# Patient Record
Sex: Male | Born: 1935 | Race: White | Hispanic: No | Marital: Married | Smoking: Former smoker
Health system: Southern US, Community
[De-identification: ages and names within clinical notes are randomized; demographics above are authoritative.]

## PROBLEM LIST (undated history)

## (undated) DIAGNOSIS — E785 Hyperlipidemia, unspecified: Secondary | ICD-10-CM

## (undated) DIAGNOSIS — K7469 Other cirrhosis of liver: Secondary | ICD-10-CM

## (undated) DIAGNOSIS — I4891 Unspecified atrial fibrillation: Secondary | ICD-10-CM

## (undated) DIAGNOSIS — I482 Chronic atrial fibrillation, unspecified: Secondary | ICD-10-CM

## (undated) DIAGNOSIS — N261 Atrophy of kidney (terminal): Secondary | ICD-10-CM

## (undated) DIAGNOSIS — I10 Essential (primary) hypertension: Secondary | ICD-10-CM

## (undated) DIAGNOSIS — K311 Adult hypertrophic pyloric stenosis: Secondary | ICD-10-CM

## (undated) DIAGNOSIS — K31819 Angiodysplasia of stomach and duodenum without bleeding: Secondary | ICD-10-CM

## (undated) DIAGNOSIS — E119 Type 2 diabetes mellitus without complications: Secondary | ICD-10-CM

## (undated) DIAGNOSIS — M1990 Unspecified osteoarthritis, unspecified site: Secondary | ICD-10-CM

## (undated) DIAGNOSIS — K922 Gastrointestinal hemorrhage, unspecified: Secondary | ICD-10-CM

## (undated) DIAGNOSIS — J181 Lobar pneumonia, unspecified organism: Secondary | ICD-10-CM

## (undated) DIAGNOSIS — D509 Iron deficiency anemia, unspecified: Secondary | ICD-10-CM

## (undated) DIAGNOSIS — R0602 Shortness of breath: Secondary | ICD-10-CM

## (undated) DIAGNOSIS — Z8701 Personal history of pneumonia (recurrent): Secondary | ICD-10-CM

## (undated) DIAGNOSIS — A0472 Enterocolitis due to Clostridium difficile, not specified as recurrent: Secondary | ICD-10-CM

## (undated) DIAGNOSIS — Z9889 Other specified postprocedural states: Secondary | ICD-10-CM

## (undated) DIAGNOSIS — J449 Chronic obstructive pulmonary disease, unspecified: Secondary | ICD-10-CM

## (undated) DIAGNOSIS — I864 Gastric varices: Secondary | ICD-10-CM

## (undated) DIAGNOSIS — Z8551 Personal history of malignant neoplasm of bladder: Secondary | ICD-10-CM

## (undated) DIAGNOSIS — G589 Mononeuropathy, unspecified: Secondary | ICD-10-CM

## (undated) DIAGNOSIS — I509 Heart failure, unspecified: Secondary | ICD-10-CM

## (undated) DIAGNOSIS — E538 Deficiency of other specified B group vitamins: Secondary | ICD-10-CM

## (undated) HISTORY — DX: Personal history of pneumonia (recurrent): Z87.01

## (undated) HISTORY — DX: Other cirrhosis of liver: K74.69

## (undated) HISTORY — DX: Adult hypertrophic pyloric stenosis: K31.1

## (undated) HISTORY — DX: Gastric varices: I86.4

## (undated) HISTORY — PX: BLADDER REMOVAL: SHX567

## (undated) HISTORY — DX: Deficiency of other specified B group vitamins: E53.8

## (undated) HISTORY — DX: Unspecified atrial fibrillation: I48.91

## (undated) HISTORY — DX: Angiodysplasia of stomach and duodenum without bleeding: K31.819

## (undated) HISTORY — DX: Mononeuropathy, unspecified: G58.9

## (undated) HISTORY — DX: Other specified postprocedural states: Z98.890

## (undated) HISTORY — DX: Gastrointestinal hemorrhage, unspecified: K92.2

## (undated) HISTORY — DX: Chronic atrial fibrillation, unspecified: I48.20

## (undated) HISTORY — DX: Lobar pneumonia, unspecified organism: J18.1

## (undated) HISTORY — DX: Atrophy of kidney (terminal): N26.1

## (undated) HISTORY — PX: FETAL BLOOD TRANSFUSION: SHX1602

## (undated) HISTORY — DX: Type 2 diabetes mellitus without complications: E11.9

## (undated) HISTORY — DX: Iron deficiency anemia, unspecified: D50.9

## (undated) HISTORY — DX: Essential (primary) hypertension: I10

## (undated) HISTORY — DX: Heart failure, unspecified: I50.9

## (undated) HISTORY — DX: Enterocolitis due to Clostridium difficile, not specified as recurrent: A04.72

## (undated) HISTORY — DX: Hyperlipidemia, unspecified: E78.5

## (undated) HISTORY — DX: Unspecified osteoarthritis, unspecified site: M19.90

## (undated) HISTORY — DX: Chronic obstructive pulmonary disease, unspecified: J44.9

## (undated) HISTORY — DX: Personal history of malignant neoplasm of bladder: Z85.51

---

## 1982-01-25 DIAGNOSIS — Z8551 Personal history of malignant neoplasm of bladder: Secondary | ICD-10-CM

## 1982-01-25 HISTORY — PX: PROSTATECTOMY: SHX69

## 1982-01-25 HISTORY — DX: Personal history of malignant neoplasm of bladder: Z85.51

## 1982-01-25 HISTORY — PX: ILEOSTOMY: SHX1783

## 1988-01-26 HISTORY — PX: PENILE PROSTHESIS IMPLANT: SHX240

## 1995-05-26 HISTORY — PX: US ECHOCARDIOGRAPHY: HXRAD669

## 1996-09-25 ENCOUNTER — Encounter: Payer: Self-pay | Admitting: Family Medicine

## 1996-09-25 LAB — CONVERTED CEMR LAB: PSA: 0.1 ng/mL

## 1997-03-25 ENCOUNTER — Encounter: Payer: Self-pay | Admitting: Family Medicine

## 1997-03-25 LAB — CONVERTED CEMR LAB: Hgb A1c MFr Bld: 5.8 %

## 1998-02-25 ENCOUNTER — Encounter: Payer: Self-pay | Admitting: Family Medicine

## 1998-02-25 LAB — CONVERTED CEMR LAB
Hgb A1c MFr Bld: 6.9 %
Microalbumin U total vol: 32.8 mg/L
PSA: 0 ng/mL

## 1998-10-26 ENCOUNTER — Encounter: Payer: Self-pay | Admitting: Family Medicine

## 1998-10-26 LAB — CONVERTED CEMR LAB: Hgb A1c MFr Bld: 5.9 %

## 1999-03-26 ENCOUNTER — Encounter: Payer: Self-pay | Admitting: Family Medicine

## 1999-04-26 ENCOUNTER — Encounter: Payer: Self-pay | Admitting: Family Medicine

## 1999-10-26 ENCOUNTER — Encounter: Payer: Self-pay | Admitting: Family Medicine

## 1999-10-26 LAB — CONVERTED CEMR LAB: Hgb A1c MFr Bld: 6.3 %

## 2000-04-25 ENCOUNTER — Encounter: Payer: Self-pay | Admitting: Family Medicine

## 2001-03-25 ENCOUNTER — Encounter: Payer: Self-pay | Admitting: Family Medicine

## 2001-09-25 ENCOUNTER — Encounter: Payer: Self-pay | Admitting: Family Medicine

## 2001-09-25 LAB — CONVERTED CEMR LAB
Hgb A1c MFr Bld: 5.2 %
Microalbumin U total vol: 47 mg/L

## 2002-01-25 ENCOUNTER — Encounter: Payer: Self-pay | Admitting: Family Medicine

## 2002-04-26 ENCOUNTER — Encounter: Payer: Self-pay | Admitting: Family Medicine

## 2002-04-26 LAB — CONVERTED CEMR LAB
Hgb A1c MFr Bld: 5.4 %
Microalbumin U total vol: 93.7 mg/L

## 2003-04-26 ENCOUNTER — Encounter: Payer: Self-pay | Admitting: Family Medicine

## 2003-07-24 HISTORY — PX: COLONOSCOPY: SHX174

## 2003-08-08 ENCOUNTER — Ambulatory Visit (HOSPITAL_COMMUNITY): Admission: RE | Admit: 2003-08-08 | Discharge: 2003-08-08 | Payer: Self-pay | Admitting: Gastroenterology

## 2003-08-08 HISTORY — PX: OTHER SURGICAL HISTORY: SHX169

## 2004-02-05 ENCOUNTER — Ambulatory Visit: Payer: Self-pay | Admitting: Family Medicine

## 2004-03-25 ENCOUNTER — Encounter: Payer: Self-pay | Admitting: Family Medicine

## 2004-03-25 LAB — CONVERTED CEMR LAB
Hgb A1c MFr Bld: 4.9 %
Hgb A1c MFr Bld: 4.9 %

## 2004-04-02 ENCOUNTER — Ambulatory Visit: Payer: Self-pay | Admitting: Family Medicine

## 2004-04-06 ENCOUNTER — Ambulatory Visit: Payer: Self-pay | Admitting: Family Medicine

## 2004-04-08 ENCOUNTER — Ambulatory Visit: Payer: Self-pay | Admitting: Family Medicine

## 2004-05-25 ENCOUNTER — Encounter: Payer: Self-pay | Admitting: Family Medicine

## 2004-05-25 LAB — CONVERTED CEMR LAB
Hgb A1c MFr Bld: 5.1 %
Hgb A1c MFr Bld: 5.1 %
Microalbumin U total vol: 200 mg/L
Microalbumin U total vol: 200 mg/L

## 2004-05-25 LAB — FECAL OCCULT BLOOD, GUAIAC: Fecal Occult Blood: NEGATIVE

## 2004-06-08 ENCOUNTER — Ambulatory Visit: Payer: Self-pay | Admitting: Family Medicine

## 2004-06-10 ENCOUNTER — Ambulatory Visit: Payer: Self-pay | Admitting: Family Medicine

## 2004-06-19 ENCOUNTER — Ambulatory Visit: Payer: Self-pay | Admitting: Family Medicine

## 2004-07-17 ENCOUNTER — Ambulatory Visit: Payer: Self-pay | Admitting: Family Medicine

## 2004-07-23 ENCOUNTER — Ambulatory Visit: Payer: Self-pay | Admitting: Family Medicine

## 2004-09-04 ENCOUNTER — Ambulatory Visit: Payer: Self-pay | Admitting: Family Medicine

## 2004-10-02 ENCOUNTER — Ambulatory Visit: Payer: Self-pay | Admitting: Family Medicine

## 2004-10-25 ENCOUNTER — Encounter: Payer: Self-pay | Admitting: Family Medicine

## 2004-10-25 LAB — CONVERTED CEMR LAB
Hgb A1c MFr Bld: 5 %
Hgb A1c MFr Bld: 5 %

## 2004-10-29 ENCOUNTER — Ambulatory Visit: Payer: Self-pay | Admitting: Family Medicine

## 2004-11-02 ENCOUNTER — Ambulatory Visit: Payer: Self-pay | Admitting: Family Medicine

## 2005-06-25 ENCOUNTER — Encounter: Payer: Self-pay | Admitting: Family Medicine

## 2005-06-25 LAB — CONVERTED CEMR LAB
Hgb A1c MFr Bld: 5.9 %
Hgb A1c MFr Bld: 5.9 %

## 2005-07-12 ENCOUNTER — Ambulatory Visit: Payer: Self-pay | Admitting: Family Medicine

## 2005-07-14 ENCOUNTER — Ambulatory Visit: Payer: Self-pay | Admitting: Family Medicine

## 2005-08-03 ENCOUNTER — Ambulatory Visit: Payer: Self-pay | Admitting: Family Medicine

## 2005-08-11 ENCOUNTER — Ambulatory Visit: Payer: Self-pay | Admitting: Family Medicine

## 2005-08-13 ENCOUNTER — Ambulatory Visit: Payer: Self-pay | Admitting: Family Medicine

## 2005-11-09 ENCOUNTER — Ambulatory Visit: Payer: Self-pay | Admitting: Family Medicine

## 2005-11-11 ENCOUNTER — Ambulatory Visit: Payer: Self-pay | Admitting: Family Medicine

## 2006-03-14 ENCOUNTER — Ambulatory Visit: Payer: Self-pay | Admitting: Family Medicine

## 2006-04-26 ENCOUNTER — Encounter: Payer: Self-pay | Admitting: Family Medicine

## 2006-05-11 ENCOUNTER — Ambulatory Visit: Payer: Self-pay | Admitting: Family Medicine

## 2006-05-13 ENCOUNTER — Ambulatory Visit: Payer: Self-pay | Admitting: Family Medicine

## 2006-07-14 ENCOUNTER — Encounter: Payer: Self-pay | Admitting: Family Medicine

## 2006-07-14 DIAGNOSIS — I10 Essential (primary) hypertension: Secondary | ICD-10-CM | POA: Insufficient documentation

## 2006-07-14 DIAGNOSIS — I359 Nonrheumatic aortic valve disorder, unspecified: Secondary | ICD-10-CM | POA: Insufficient documentation

## 2006-07-14 DIAGNOSIS — E785 Hyperlipidemia, unspecified: Secondary | ICD-10-CM | POA: Insufficient documentation

## 2006-07-14 DIAGNOSIS — E118 Type 2 diabetes mellitus with unspecified complications: Secondary | ICD-10-CM

## 2006-07-18 ENCOUNTER — Ambulatory Visit: Payer: Self-pay | Admitting: Family Medicine

## 2006-07-18 LAB — CONVERTED CEMR LAB
AST: 31 units/L (ref 0–37)
Alkaline Phosphatase: 83 units/L (ref 39–117)
BUN: 16 mg/dL (ref 6–23)
Basophils Absolute: 0.2 10*3/uL — ABNORMAL HIGH (ref 0.0–0.1)
Calcium: 9.3 mg/dL (ref 8.4–10.5)
Eosinophils Relative: 4.8 % (ref 0.0–5.0)
GFR calc non Af Amer: 78 mL/min
HCT: 35.9 % — ABNORMAL LOW (ref 39.0–52.0)
HDL: 29.1 mg/dL — ABNORMAL LOW (ref >39.0)
Hgb A1c MFr Bld: 5 % (ref 4.6–6.0)
Lymphocytes Relative: 32.7 % (ref 12.0–46.0)
MCV: 93.2 fL (ref 78.0–100.0)
Monocytes Absolute: 0.5 10*3/uL (ref 0.2–0.7)
Neutro Abs: 3 10*3/uL (ref 1.4–7.7)
Neutrophils Relative %: 49.7 % (ref 43.0–77.0)
Potassium: 4.2 meq/L (ref 3.5–5.1)
TSH: 3.74 microintl units/mL (ref 0.35–5.50)
Total Bilirubin: 0.5 mg/dL (ref 0.3–1.2)
Total Protein: 7.2 g/dL (ref 6.0–8.3)
Triglycerides: 115 mg/dL (ref 0–149)

## 2006-07-25 ENCOUNTER — Ambulatory Visit: Payer: Self-pay | Admitting: Family Medicine

## 2006-08-09 ENCOUNTER — Ambulatory Visit: Payer: Self-pay | Admitting: Family Medicine

## 2006-08-10 ENCOUNTER — Ambulatory Visit: Payer: Self-pay | Admitting: Family Medicine

## 2006-08-17 ENCOUNTER — Ambulatory Visit: Payer: Self-pay | Admitting: Family Medicine

## 2006-09-09 ENCOUNTER — Ambulatory Visit: Payer: Self-pay | Admitting: Family Medicine

## 2006-09-12 ENCOUNTER — Ambulatory Visit: Payer: Self-pay | Admitting: Family Medicine

## 2006-09-12 LAB — CONVERTED CEMR LAB
CO2: 27 meq/L (ref 19–32)
Calcium: 9.2 mg/dL (ref 8.4–10.5)
GFR calc non Af Amer: 88 mL/min
Potassium: 4.1 meq/L (ref 3.5–5.1)
Sodium: 141 meq/L (ref 135–145)

## 2006-09-14 ENCOUNTER — Ambulatory Visit: Payer: Self-pay | Admitting: Family Medicine

## 2006-10-12 ENCOUNTER — Ambulatory Visit: Payer: Self-pay | Admitting: Gastroenterology

## 2006-10-26 ENCOUNTER — Ambulatory Visit (HOSPITAL_COMMUNITY): Admission: RE | Admit: 2006-10-26 | Discharge: 2006-10-26 | Payer: Self-pay | Admitting: Gastroenterology

## 2006-10-26 ENCOUNTER — Encounter: Payer: Self-pay | Admitting: Family Medicine

## 2006-10-26 ENCOUNTER — Encounter: Payer: Self-pay | Admitting: Gastroenterology

## 2006-10-26 DIAGNOSIS — K648 Other hemorrhoids: Secondary | ICD-10-CM | POA: Insufficient documentation

## 2006-10-26 HISTORY — PX: ESOPHAGOGASTRODUODENOSCOPY: SHX1529

## 2006-10-26 HISTORY — PX: COLONOSCOPY: SHX174

## 2006-10-26 LAB — HM COLONOSCOPY

## 2006-10-28 ENCOUNTER — Ambulatory Visit (HOSPITAL_COMMUNITY): Admission: RE | Admit: 2006-10-28 | Discharge: 2006-10-28 | Payer: Self-pay | Admitting: Gastroenterology

## 2006-11-02 ENCOUNTER — Ambulatory Visit: Payer: Self-pay | Admitting: Gastroenterology

## 2006-11-02 ENCOUNTER — Encounter: Payer: Self-pay | Admitting: Family Medicine

## 2006-11-02 LAB — CONVERTED CEMR LAB
Anti Nuclear Antibody(ANA): NEGATIVE
Folate: 14.2 ng/mL
Hep B S Ab: NEGATIVE
Hepatitis B Surface Ag: NEGATIVE
Iron: 60 ug/dL (ref 42–165)
Transferrin: 303.9 mg/dL (ref >=212.0)
Vitamin B-12: 200 pg/mL — ABNORMAL LOW (ref 211–911)

## 2006-11-03 ENCOUNTER — Ambulatory Visit: Payer: Self-pay | Admitting: Family Medicine

## 2006-11-04 ENCOUNTER — Ambulatory Visit: Payer: Self-pay | Admitting: Gastroenterology

## 2006-11-15 ENCOUNTER — Ambulatory Visit: Payer: Self-pay | Admitting: Gastroenterology

## 2007-03-03 ENCOUNTER — Ambulatory Visit: Payer: Self-pay | Admitting: Family Medicine

## 2007-03-06 ENCOUNTER — Ambulatory Visit: Payer: Self-pay | Admitting: Family Medicine

## 2007-03-07 ENCOUNTER — Telehealth (INDEPENDENT_AMBULATORY_CARE_PROVIDER_SITE_OTHER): Payer: Self-pay | Admitting: *Deleted

## 2007-04-03 DIAGNOSIS — K4090 Unilateral inguinal hernia, without obstruction or gangrene, not specified as recurrent: Secondary | ICD-10-CM | POA: Insufficient documentation

## 2007-04-03 DIAGNOSIS — D509 Iron deficiency anemia, unspecified: Secondary | ICD-10-CM

## 2007-06-29 ENCOUNTER — Ambulatory Visit: Payer: Self-pay | Admitting: Family Medicine

## 2007-07-04 ENCOUNTER — Ambulatory Visit: Payer: Self-pay | Admitting: Family Medicine

## 2007-08-08 ENCOUNTER — Ambulatory Visit: Payer: Self-pay | Admitting: Family Medicine

## 2007-08-10 ENCOUNTER — Encounter: Payer: Self-pay | Admitting: Family Medicine

## 2007-11-28 ENCOUNTER — Telehealth: Payer: Self-pay | Admitting: Family Medicine

## 2007-11-28 ENCOUNTER — Ambulatory Visit: Payer: Self-pay | Admitting: Gastroenterology

## 2007-11-28 DIAGNOSIS — D61818 Other pancytopenia: Secondary | ICD-10-CM

## 2007-11-29 ENCOUNTER — Ambulatory Visit: Payer: Self-pay | Admitting: Family Medicine

## 2007-11-30 LAB — CONVERTED CEMR LAB
ALT: 22 units/L (ref 0–53)
AST: 28 units/L (ref 0–37)
Basophils Absolute: 0 10*3/uL (ref 0.0–0.1)
CO2: 31 meq/L (ref 19–32)
Cholesterol: 100 mg/dL (ref 0–200)
Eosinophils Absolute: 0.3 10*3/uL (ref 0.0–0.7)
GFR calc non Af Amer: 88 mL/min
Glucose, Bld: 108 mg/dL — ABNORMAL HIGH (ref 70–99)
HDL: 35.9 mg/dL — ABNORMAL LOW (ref >39.0)
Hgb A1c MFr Bld: 4.9 % (ref 4.6–6.0)
Iron: 39 ug/dL — ABNORMAL LOW (ref 42–165)
LDL Cholesterol: 41 mg/dL (ref 0–99)
MCHC: 35 g/dL (ref 30.0–36.0)
Microalb, Ur: 12.2 mg/dL — ABNORMAL HIGH (ref 0.0–1.9)
Monocytes Absolute: 0.6 10*3/uL (ref 0.1–1.0)
Neutro Abs: 4 10*3/uL (ref 1.4–7.7)
Sodium: 143 meq/L (ref 135–145)
TSH: 3.35 microintl units/mL (ref 0.35–5.50)
Total CHOL/HDL Ratio: 2.8

## 2007-12-05 ENCOUNTER — Ambulatory Visit: Payer: Self-pay | Admitting: Family Medicine

## 2007-12-05 DIAGNOSIS — N259 Disorder resulting from impaired renal tubular function, unspecified: Secondary | ICD-10-CM | POA: Insufficient documentation

## 2008-01-02 ENCOUNTER — Telehealth: Payer: Self-pay | Admitting: Family Medicine

## 2008-01-15 ENCOUNTER — Ambulatory Visit: Payer: Self-pay | Admitting: Family Medicine

## 2008-06-10 ENCOUNTER — Ambulatory Visit: Payer: Self-pay | Admitting: Family Medicine

## 2008-06-10 ENCOUNTER — Telehealth: Payer: Self-pay | Admitting: Family Medicine

## 2008-06-12 ENCOUNTER — Encounter: Payer: Self-pay | Admitting: Cardiology

## 2008-06-12 ENCOUNTER — Ambulatory Visit: Payer: Self-pay | Admitting: Cardiology

## 2008-06-13 LAB — CONVERTED CEMR LAB: Pro B Natriuretic peptide (BNP): 43 pg/mL (ref 0.0–100.0)

## 2008-06-14 ENCOUNTER — Telehealth: Payer: Self-pay | Admitting: Family Medicine

## 2008-06-20 ENCOUNTER — Encounter: Payer: Self-pay | Admitting: Cardiology

## 2008-06-20 ENCOUNTER — Ambulatory Visit: Payer: Self-pay

## 2008-06-25 ENCOUNTER — Ambulatory Visit: Payer: Self-pay | Admitting: Family Medicine

## 2008-07-02 ENCOUNTER — Ambulatory Visit: Payer: Self-pay | Admitting: Cardiology

## 2008-07-03 ENCOUNTER — Encounter: Payer: Self-pay | Admitting: Cardiology

## 2008-07-03 ENCOUNTER — Ambulatory Visit: Payer: Self-pay | Admitting: Cardiology

## 2008-07-03 LAB — CONVERTED CEMR LAB
ALT: 20 units/L (ref 0–53)
AST: 25 units/L (ref 0–37)
Albumin: 4.6 g/dL (ref 3.5–5.2)
Alkaline Phosphatase: 92 units/L (ref 39–117)
CO2: 19 meq/L (ref 19–32)
Calcium: 9.5 mg/dL (ref 8.4–10.5)
Chloride: 108 meq/L (ref 96–112)
Cholesterol: 92 mg/dL (ref 0–200)
Glucose, Bld: 103 mg/dL — ABNORMAL HIGH (ref 70–99)
HDL: 35 mg/dL — ABNORMAL LOW (ref >39)
Potassium: 4.3 meq/L (ref 3.5–5.3)
Total Protein: 7 g/dL (ref 6.0–8.3)

## 2008-07-17 ENCOUNTER — Ambulatory Visit: Payer: Self-pay | Admitting: Cardiology

## 2008-07-22 LAB — CONVERTED CEMR LAB
BUN: 20 mg/dL (ref 6–23)
Creatinine, Ser: 1.19 mg/dL (ref 0.40–1.50)
Potassium: 4.2 meq/L (ref 3.5–5.3)

## 2008-08-01 ENCOUNTER — Ambulatory Visit: Payer: Self-pay | Admitting: Family Medicine

## 2008-08-14 ENCOUNTER — Encounter: Payer: Self-pay | Admitting: Cardiology

## 2008-12-10 ENCOUNTER — Ambulatory Visit: Payer: Self-pay | Admitting: Family Medicine

## 2008-12-10 LAB — CONVERTED CEMR LAB
ALT: 25 units/L (ref 0–53)
Albumin: 4.3 g/dL (ref 3.5–5.2)
BUN: 17 mg/dL (ref 6–23)
Basophils Relative: 1.2 % (ref 0.0–3.0)
Bilirubin, Direct: 0.1 mg/dL (ref 0.0–0.3)
Chloride: 104 meq/L (ref 96–112)
Cholesterol: 84 mg/dL (ref 0–200)
Creatinine,U: 68.7 mg/dL
Glucose, Bld: 133 mg/dL — ABNORMAL HIGH (ref 70–99)
HCT: 38.6 % — ABNORMAL LOW (ref 39.0–52.0)
Hgb A1c MFr Bld: 5.7 % (ref 4.6–6.5)
LDL Cholesterol: 42 mg/dL (ref 0–99)
Monocytes Absolute: 0.7 10*3/uL (ref 0.1–1.0)
Monocytes Relative: 11.2 % (ref 3.0–12.0)
Neutrophils Relative %: 57.5 % (ref 43.0–77.0)
Platelets: 167 10*3/uL (ref 150.0–400.0)
Potassium: 4 meq/L (ref 3.5–5.1)
Total CHOL/HDL Ratio: 3
Triglycerides: 70 mg/dL (ref 0.0–149.0)
VLDL: 14 mg/dL (ref 0.0–40.0)
WBC: 6.3 10*3/uL (ref 4.5–10.5)

## 2008-12-17 ENCOUNTER — Ambulatory Visit: Payer: Self-pay | Admitting: Family Medicine

## 2009-03-24 ENCOUNTER — Telehealth: Payer: Self-pay | Admitting: Family Medicine

## 2009-03-24 ENCOUNTER — Encounter: Payer: Self-pay | Admitting: Family Medicine

## 2009-04-30 ENCOUNTER — Ambulatory Visit: Payer: Self-pay | Admitting: Family Medicine

## 2009-04-30 LAB — CONVERTED CEMR LAB
BUN: 15 mg/dL (ref 6–23)
Creatinine, Ser: 1 mg/dL (ref 0.4–1.5)
Glucose, Bld: 114 mg/dL — ABNORMAL HIGH (ref 70–99)
Potassium: 4.1 meq/L (ref 3.5–5.1)

## 2009-05-05 ENCOUNTER — Ambulatory Visit: Payer: Self-pay | Admitting: Family Medicine

## 2009-06-16 ENCOUNTER — Ambulatory Visit: Payer: Self-pay | Admitting: Family Medicine

## 2009-06-16 LAB — CONVERTED CEMR LAB: Hgb A1c MFr Bld: 5.3 % (ref 4.6–6.5)

## 2009-06-17 ENCOUNTER — Telehealth (INDEPENDENT_AMBULATORY_CARE_PROVIDER_SITE_OTHER): Payer: Self-pay | Admitting: *Deleted

## 2009-06-19 ENCOUNTER — Ambulatory Visit: Payer: Self-pay | Admitting: Family Medicine

## 2009-06-30 ENCOUNTER — Ambulatory Visit: Payer: Self-pay | Admitting: Family Medicine

## 2009-09-02 ENCOUNTER — Encounter (INDEPENDENT_AMBULATORY_CARE_PROVIDER_SITE_OTHER): Payer: Self-pay | Admitting: *Deleted

## 2009-12-16 ENCOUNTER — Ambulatory Visit: Payer: Self-pay | Admitting: Family Medicine

## 2009-12-17 LAB — CONVERTED CEMR LAB
BUN: 16 mg/dL (ref 6–23)
Calcium: 9.4 mg/dL (ref 8.4–10.5)
Chloride: 106 meq/L (ref 96–112)
Eosinophils Absolute: 0.4 10*3/uL (ref 0.0–0.7)
GFR calc non Af Amer: 81.27 mL/min (ref >60)
Glucose, Bld: 135 mg/dL — ABNORMAL HIGH (ref 70–99)
HCT: 39.2 % (ref 39.0–52.0)
HDL: 29.1 mg/dL — ABNORMAL LOW (ref >39.00)
Lymphocytes Relative: 22.5 % (ref 12.0–46.0)
Lymphs Abs: 1.6 10*3/uL (ref 0.7–4.0)
MCHC: 34.4 g/dL (ref 30.0–36.0)
MCV: 94 fL (ref 78.0–100.0)
Microalb Creat Ratio: 20.2 mg/g (ref 0.0–30.0)
Microalb, Ur: 13.3 mg/dL — ABNORMAL HIGH (ref 0.0–1.9)
Monocytes Relative: 9.5 % (ref 3.0–12.0)
Neutrophils Relative %: 61.9 % (ref 43.0–77.0)
Phosphorus: 3.6 mg/dL (ref 2.3–4.6)
Potassium: 4.2 meq/L (ref 3.5–5.1)
RBC: 4.17 M/uL — ABNORMAL LOW (ref 4.22–5.81)
RDW: 14.2 % (ref 11.5–14.6)
Total Bilirubin: 0.8 mg/dL (ref 0.3–1.2)
Triglycerides: 90 mg/dL (ref 0.0–149.0)
VLDL: 18 mg/dL (ref 0.0–40.0)
Vitamin B-12: 1394 pg/mL — ABNORMAL HIGH (ref 211–911)

## 2009-12-24 ENCOUNTER — Ambulatory Visit: Payer: Self-pay | Admitting: Family Medicine

## 2010-01-21 ENCOUNTER — Ambulatory Visit: Payer: Self-pay | Admitting: Family Medicine

## 2010-01-25 DIAGNOSIS — Z8701 Personal history of pneumonia (recurrent): Secondary | ICD-10-CM

## 2010-01-25 HISTORY — DX: Personal history of pneumonia (recurrent): Z87.01

## 2010-02-24 NOTE — Progress Notes (Signed)
Summary: Alternative for Avalide  Phone Note From Pharmacy Call back at [phone]   Caller: [facility] [place]. [phone]* Call For: Dr. Hetty Ely  Summary of Call: Received fax from pharmacy stating that Avalide 300-25 has been recalled.  Patient is aware of this recall and the pharmacist wants to know what new Rx do you want to put the patient on in place of Avalide.  Please advise. Initial call taken by: Linde Gillis CMA Duncan Dull),  March 24, 2009 9:58 AM  Follow-up for Phone Call        Start Hyzaar 100/25 in AM, see me 6 wks after starting new medication with Bmet prior 401.9 Follow-up by: Shaune Leeks MD,  March 24, 2009 1:48 PM  Additional Follow-up for Phone Call Additional follow up Details #1::        Pharmacist called Additional Follow-up by: Daine Gip,  March 24, 2009 2:21 PM    Additional Follow-up for Phone Call Additional follow up Details #2::    Patient advised as instructed.  Rx to [facility] on [address] cancelled, patient requested that it be sent to [facility] on [address]  6 week f/u appt and lab appt scheduled. Follow-up by: Linde Gillis CMA Duncan Dull),  March 24, 2009 2:33 PM  New/Updated Medications: HYZAAR 100-25 MG TABS (LOSARTAN POTASSIUM-HCTZ) one tab by mouth in AM Prescriptions: HYZAAR 100-25 MG TABS (LOSARTAN POTASSIUM-HCTZ) one tab by mouth in AM  #30 x 12   Entered by:   Linde Gillis CMA (AAMA)   Authorized by:   Shaune Leeks MD   Signed by:   Linde Gillis CMA (AAMA) on 03/24/2009   Method used:   Electronically to        [facility]. [address] [phone]* (retail)       [address] [place], [place]  [id number]       Ph: [phone]       Fax: [phone]   RxID:   [id number] HYZAAR 100-25 MG TABS (LOSARTAN POTASSIUM-HCTZ) one tab by mouth in AM  #30 x 12   Entered and Authorized by:   Shaune Leeks MD   Signed by:   Shaune Leeks MD on 03/24/2009   Method used:   Electronically to     [facility] [phone]* (retail)       [address] [place], [place]  [address]       Ph: [phone]       Fax: [phone]   RxID:   [id number]

## 2010-02-24 NOTE — Progress Notes (Signed)
   Request Recieved from [facility] sent to [facility] [place]  Jun 17, 2009 9:02 AM

## 2010-02-24 NOTE — Miscellaneous (Signed)
Summary: Consent for removal of skin tags from neck  Consent for removal of skin tags from neck   Imported By: Beau Fanny 07/01/2009 10:12:00  _____________________________________________________________________  External Attachment:    Type:   Image     Comment:   External Document

## 2010-02-24 NOTE — Assessment & Plan Note (Signed)
Summary: F/U AFTER LABS / LFW   Vital Signs:  Patient profile:   77 year old male Weight:      231.50 pounds Temp:     98.3 degrees F oral Pulse rate:   60 / minute Pulse rhythm:   regular BP sitting:   150 / 70  (left arm) Cuff size:   large  Vitals Entered By: Sydell Axon LPN (Jun 19, 2009 8:04 AM) CC: 6 Month follow-up after labs   History of Present Illness: Pt here for followup. He has frequent loose stools. We had changed from Hyzaar to Cozaar as he felt the diuretic was doing that but obviously he continues with it so not the problem. Could be the Losartan molecule itself. He was switched from Avalide to Losartan due to insurance.  He also has a skin tag on the lower neck he would like removed as he hits it all the time shaving and it is therefore getting bigger and bleeds a lot.  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 1 By Mouth At Bedtime 2)  Simvastatin 80 Mg Tabs (Simvastatin) .... Take 1 Tablet By Mouth At Bedtime 3)  Cozaar 100 Mg Tabs  (Losartan Potassium) .... One Tab By Mouth Daily. 4)  Adult Aspirin Ec Low Strength 81 Mg  Tbec (Aspirin) .Marland Kitchen.. 1 By Mouth Daily 5)  Amlodipine Besylate 10 Mg Tabs (Amlodipine Besylate) .... One Tab By Mouth At Night 6)  Ferro-Bob 325 (65 Fe) Mg  Tabs (Ferrous Sulfate) .Marland Kitchen.. 1 By Mouth Daily 7)  Diprolene   Oint (Aug Betamethasone Dipropionate Oint) .... As Needed 8)  Onetouch Ultra Test   Strp (Glucose Blood) .Marland Kitchen.. 1 Daily As Directed// Icd-9 Code 250.00 9)  Onetouch Lancets   Misc (Lancets) .Marland Kitchen.. 1 Daily and As Needed/ Icd-9 Code 250.00 10)  Potassium Gluconate 595 Mg Cr-Tabs (Potassium Gluconate) .Marland Kitchen.. 1 Tablet By Mouth Once Daily 11)  Vitamin B-12 2500 Mcg Subl (Cyanocobalamin) .Marland Kitchen.. 1 Daily By Mouth 12)  Tylenol 325 Mg Tabs (Acetaminophen) .... As Needed 13)  Furosemide 20 Mg Tabs (Furosemide) .... Take One Tablet By Mouth Daily. 14)  Carvedilol 12.5 Mg Tabs (Carvedilol) .... Take One Tablet By Mouth Twice A Day  Allergies: No Known Drug Allergies  Physical Exam  General:  Well-developed,well-nourished,in no acute distress; alert,appropriate and cooperative throughout examination, obese,  comfortable and breathing without distress, but mildly congested. Head:  Normocephalic and atraumatic without obvious abnormalities. Almost total  balding. Sinuses NT. Eyes:  Conjunctiva clear bilaterally.  Lungs:  Normal respiratory effort, chest expands symmetrically. Lungs are clear to auscultation, no  crackles or wheezes. Fine wheeze cleared after second deep breath. Heart:  Normal rate and regular rhythm. S1 and S2 normal without gallop, murmur, click, rub or other extra sounds. Skin:  Verrucal skin tag on lower ant neck. Appears benign.   Impression & Recommendations:  Problem # 1:  HYPERTENSION (ICD-401.9) Assessment Deteriorated Worse today. Will change back to Hyzaar and try strategy to firm up stools. See instructions. His updated medication list for this problem includes:    Hyzaar 100-12.5  Mg Tabs (Losartan potassium-hctz) ..... One tab by mouth in am    Amlodipine Besylate 10 Mg Tabs (Amlodipine besylate) ..... One tab by mouth at night    Furosemide 20 Mg Tabs (Furosemide) .Marland Kitchen... Take one tablet by mouth daily.    Carvedilol 12.5 Mg Tabs (Carvedilol) .Marland Kitchen... Take one tablet by mouth twice a day  BP today: 150/70 Prior BP: 134/60 (05/05/2009)  Labs Reviewed: K+: 4.1 (04/30/2009) Creat: : 1.0 (04/30/2009)   Chol: 84 (12/10/2008)   HDL: 28.30 (12/10/2008)   LDL: 42 (12/10/2008)   TG: 70.0 (12/10/2008)  Problem # 2:  DIARRHEA (ICD-787.91) Assessment: Unchanged  Start Citrucel per instructions.  Discussed symptom control and diet. Call if worsening of symptoms or signs of dehydration.   Problem # 3:  DIABETES MELLITUS, TYPE II (ICD-250.00) Assessment: Improved Great control. Conrt curr meds, kidney fctn nml. His updated medication list for this problem includes:    Metformin Hcl 500 Mg Tabs (Metformin hcl) .Marland Kitchen... 1 by mouth at bedtime    Hyzaar 100-12.5 Mg Tabs (Losartan potassium-hctz) ..... One tab by mouth in am    Adult Aspirin Ec Low Strength 81 Mg Tbec (Aspirin) .Marland Kitchen... 1 by mouth daily  Labs Reviewed: Creat: 1.0 (04/30/2009)   Microalbumin: 139 (06/25/2005)  Last Eye Exam: normal (04/03/2008) Reviewed HgBA1c results: 5.3 (06/16/2009)  5.7 (12/10/2008)  Problem # 4:  SKIN TAG, IRRITATED (ICD-701.9) Assessment: New Will schedule pt for excision via shave technique and cautery.  Complete Medication List: 1)  Metformin Hcl 500 Mg Tabs (Metformin hcl) .Marland Kitchen.. 1 by mouth at bedtime 2)  Simvastatin 80 Mg Tabs (Simvastatin) .... Take 1 tablet by mouth at bedtime 3)  Hyzaar 100-12.5 Mg Tabs (Losartan potassium-hctz) .... One tab by mouth in am 4)  Adult Aspirin Ec Low Strength 81 Mg Tbec (Aspirin) .Marland Kitchen.. 1 by mouth daily 5)  Amlodipine Besylate 10 Mg Tabs (Amlodipine besylate) .... One tab by mouth at night 6)  Ferro-bob 325 (65 Fe) Mg Tabs (Ferrous sulfate) .Marland Kitchen.. 1 by mouth  daily 7)  Diprolene Oint (Aug betamethasone dipropionate oint) .... As needed 8)  Onetouch Ultra Test Strp (Glucose blood) .Marland Kitchen.. 1 daily as directed// icd-9 code 250.00 9)  Onetouch Lancets Misc (Lancets) .Marland Kitchen.. 1 daily and as needed/ icd-9 code 250.00 10)  Potassium Gluconate 595 Mg Cr-tabs (Potassium gluconate) .Marland Kitchen.. 1 tablet by mouth once daily 11)  Vitamin B-12 2500 Mcg Subl (Cyanocobalamin) .Marland Kitchen.. 1 daily by mouth 12)  Tylenol 325 Mg Tabs (Acetaminophen) .... As needed 13)  Furosemide 20 Mg Tabs (Furosemide) .... Take one tablet by mouth daily. 14)  Carvedilol 12.5 Mg Tabs (Carvedilol) .... Take one tablet by mouth twice a day  Patient Instructions: 1)  RTC for skin tag removal when able.  2)  Try Citrucel 1 tsp in 8 oz of water every morning. 3)  Finish Cozaar and then go back to Hyzaar. Prescriptions: HYZAAR 100-12.5 MG TABS (LOSARTAN POTASSIUM-HCTZ) one tab by mouth in AM  #30 x 12   Entered  and Authorized by:   Shaune Leeks MD   Signed by:   Shaune Leeks MD on 06/19/2009   Method used:   Electronically to        [facility]. [address] [phone]* (retail)       [address] [place], [place]  [id number]       Ph: [phone]       Fax: [phone]   RxID:   [id number]   Current Allergies (reviewed today): No known allergies

## 2010-02-24 NOTE — Letter (Signed)
Summary: Nadara Eaton letter  [place] [facility]  [address] [place], [place] [address]   Phone: [phone]  Fax: [phone]       09/02/2009 MRN: [id number]  Bosten Dominy [address] [place], [place]  [address]  Dear Mr. Yott,  [facility] [place], and  announce the retirement of Arta Silence, M.D., from full-time practice at the [facility] office effective July 24, 2009 and his plans of returning part-time.  It is important to Dr. Hetty Ely and to our practice that you understand that [facility] has seven physicians in our office for your health care needs.  We will continue to offer the same exceptional care that you have today.    Dr. Hetty Ely has spoken to many of you about his plans for retirement and returning part-time in the fall.   We will continue to work with you through the transition to schedule appointments for you in the office and meet the high standards that [place] is committed to.   Again, it is with great pleasure that we share the news that Dr. Hetty Ely will return to [facility] at [facility] in October of 2011 with a reduced schedule.    If you have any questions, or would like to request an appointment with one of our physicians, please call us at [phone] and press the option for Scheduling an appointment.  We take pleasure in providing you with excellent patient care and look forward to seeing you at your next office visit.  Our [facility] Physicians are:  Tillman Abide, M.D. Laurita Quint, M.D. Roxy Manns, M.D. Kerby Nora, M.D. Hannah Beat, M.D. Ruthe Mannan, M.D. We proudly welcomed Raechel Ache, M.D. and Eustaquio Boyden, M.D. to the practice in July/August 2011.  Sincerely,  [facility]

## 2010-02-24 NOTE — Assessment & Plan Note (Signed)
Summary: CPX/DLO   Vital Signs:  Patient profile:   77 year old male Height:      69 inches Weight:      230 pounds BMI:     34.09 Temp:     97.6 degrees F oral Pulse rate:   76 / minute Pulse rhythm:   regular BP sitting:   162 / 62  (left arm) Cuff size:   large  Vitals Entered By: Linde Gillis CMA Duncan Dull) (December 24, 2009 10:55 AM) CC: complete physicial   History of Present Illness: Pt here for Comp Exam. He has had a little cough lately and has taken diabetic cough medicine and Mucinex...he doesn't know if it was D. He hasn't had any in a week or so. He has a hacky cough that feels like he needs to bring something up but can't. He otherwise feels well with no complaints. He is not on an ACEI. Sugars running 110s -150s, one 269.  Preventive Screening-Counseling & Management  Alcohol-Tobacco     Alcohol drinks/day: <1     Alcohol type: beer      Smoking Status: quit     Year Quit: 1990     Pack years: 50     Passive Smoke Exposure: no  Caffeine-Diet-Exercise     Caffeine use/day: 3     Does Patient Exercise: no     Type of exercise: yard work     Times/week: 2  Problems Prior to Update: 1)  Skin Tag, Irritated  (ICD-701.9) 2)  Syncope  (ICD-780.2) 3)  Diarrhea  (ICD-787.91) 4)  Dyspnea On Exertion  (ICD-786.09) 5)  Renal Insufficiency  (ICD-588.9) 6)  Anemia-b12 Deficiency  (ICD-281.1) 7)  Cirrhosis  (ICD-571.5) 8)  Shoulder Strain, Right Deltoid  (ICD-840.9) 9)  Inguinal Hernia, Left  (ICD-550.90) 10)  Anemia, Iron Deficiency  (ICD-280.9) 11)  Pyloric Stenosis  (ICD-750.5) 12)  Hemorrhoids, Internal  (ICD-455.0) 13)  Varices, Site Nec (GASTRIC)  (ICD-456.8) 14)  Back Pain  (ICD-724.5) 15)  Blood in Stool (INT HEMMS)  (ICD-578.1) 16)  Screening For Malignannt Neoplasm, Site Nec  (ICD-V76.49) 17)  Dependent Edema, Legs  (ICD-782.3) 18)  Screening For Malignant Neoplasm, Prostate  (ICD-V76.44) 19)  Bladder Cancer (ILEOSTOMY/PROSTATECTOMY)  (ICD-188.9) 20)   Aortic Insufficiency  (ICD-424.1) 21)  Hypertension  (ICD-401.9) 22)  Hyperlipidemia  (ICD-272.4) 23)  Diabetes Mellitus, Type II  (ICD-250.00)  Medications Prior to Update: 1)  Metformin Hcl 500 Mg Tabs (Metformin Hcl) .Marland Kitchen.. 1 By Mouth At Bedtime 2)  Simvastatin 80 Mg Tabs (Simvastatin) .... Take 1 Tablet By Mouth At Bedtime 3)  Hyzaar 100-12.5 Mg Tabs (Losartan Potassium-Hctz) .... One Tab By Mouth in Am 4)  Adult Aspirin Ec Low Strength 81 Mg  Tbec (Aspirin) .Marland Kitchen.. 1 By Mouth Daily 5)  Amlodipine Besylate 10 Mg Tabs (Amlodipine Besylate) .... One Tab By Mouth At Night 6)  Ferro-Bob 325 (65 Fe) Mg  Tabs (Ferrous Sulfate) .Marland Kitchen.. 1 By Mouth Daily 7)  Diprolene   Oint (Aug Betamethasone Dipropionate Oint) .... As Needed 8)  Onetouch Ultra Test   Strp (Glucose Blood) .Marland Kitchen.. 1 Daily As Directed// Icd-9 Code 250.00 9)  Onetouch Lancets   Misc (Lancets) .Marland Kitchen.. 1 Daily and As Needed/ Icd-9 Code 250.00 10)  Potassium Gluconate 595 Mg Cr-Tabs (Potassium Gluconate) .Marland Kitchen.. 1 Tablet By Mouth Once Daily 11)  Vitamin B-12 2500 Mcg Subl (Cyanocobalamin) .Marland Kitchen.. 1 Daily By Mouth 12)  Tylenol 325 Mg Tabs (Acetaminophen) .... As Needed 13)  Furosemide 20 Mg  Tabs (Furosemide) .... Take One Tablet By Mouth Daily. 14)  Carvedilol 12.5 Mg Tabs (Carvedilol) .... Take One Tablet By Mouth Twice A Day  Current Medications (verified): 1)  Metformin Hcl 500 Mg Tabs (Metformin Hcl) .Marland Kitchen.. 1 By Mouth At Bedtime 2)  Simvastatin 80 Mg Tabs (Simvastatin) .... Take 1 Tablet By Mouth At Bedtime 3)  Hyzaar 100-12.5 Mg Tabs (Losartan Potassium-Hctz) .... One Tab By Mouth in Am 4)  Adult Aspirin Ec Low Strength 81 Mg  Tbec (Aspirin) .Marland Kitchen.. 1 By Mouth Daily 5)  Amlodipine Besylate 10 Mg Tabs (Amlodipine Besylate) .... One Tab By Mouth At Night 6)  Ferro-Bob 325 (65 Fe) Mg  Tabs (Ferrous Sulfate) .Marland Kitchen.. 1 By Mouth Daily 7)  Diprolene   Oint (Aug Betamethasone Dipropionate Oint) .... As Needed 8)  Onetouch Ultra Test   Strp (Glucose Blood) .Marland Kitchen..  1 Daily As Directed// Icd-9 Code 250.00 9)  Onetouch Lancets   Misc (Lancets) .Marland Kitchen.. 1 Daily and As Needed/ Icd-9 Code 250.00 10)  Potassium Gluconate 595 Mg Cr-Tabs (Potassium Gluconate) .Marland Kitchen.. 1 Tablet By Mouth Once Daily 11)  Vitamin B-12 2500 Mcg Subl (Cyanocobalamin) .Marland Kitchen.. 1 Daily By Mouth 12)  Tylenol 325 Mg Tabs (Acetaminophen) .... As Needed 13)  Furosemide 20 Mg Tabs (Furosemide) .... Take One Tablet By Mouth Daily. 14)  Carvedilol 12.5 Mg Tabs (Carvedilol) .... Take One Tablet By Mouth Twice A Day  Allergies (verified): No Known Drug Allergies  Past History:  Past Medical History: Last updated: 07/03/2008 1. Diabetes mellitus, type II 2. Hyperlipidemia 3. Hypertension 4. Cryptogenic cirrhosis:  Followed by Dr. Russella Dar, never biopsied, has been stable.  History of gastric varices.  5. Pyloric stenosis-mild 6. Left inguinal hernia 7. Iron deficiency anemia 8. B12 deficiency 9. Diastolic CHF:  Echo 5/10 with mild LVH, EF 75%, grade I diastolic dysfunction, severe left atrial enlargement, aortic sclerosis. 10. Bladder cancer 1984: s/p ileal conduit 11. Hemorrhoids    Family History: Last updated: 12/24/2009 Father: dec. 24, MI Mother: dec  36 Natural Causes  HTN, osteoporosis, kyphoplasty x 2  Brother dec 76 MI  DM,  Sisters 81   1    both with CAD, developed in their late 42s. CV:  (+) Father, sister x 2 HBP:  (+) uncles and mother DM:  (+) self, uncle, GP's, brother CA:  self, bladder Stroke:  (+) GP's No FH of Colon Cancer:  Social History: Last updated: 06/12/2008 Marital Status: Married, lives with wife Children: 7 Occupation: Retired, trucking Alcohol use-yes, beer/week Drug use-no Daily Caffeine Use Tobacco Use - Former, quit 1990.   Risk Factors: Alcohol Use: <1 (12/24/2009) Caffeine Use: 3 (12/24/2009) Exercise: no (12/24/2009)  Risk Factors: Smoking Status: quit (12/24/2009) Passive Smoke Exposure: no (12/24/2009)  Past Surgical History: 1984           Bladder Ca w/Ileostomy and Prosatectomy 1990          Inflatable Penis Implant 5/97           Echo  EF 60%  T.R.  A.I. 07/24/03      Colonoscopy, int. hemorrhoids 08/08/03      BE 10/26/06      Colonoscopy, int hemmorhoids             10 yrs 10/26/06      EGD Gastric Varices, Pyloric Stenosis  Family History: Father: dec. 20, MI Mother: dec  76 Natural Causes  HTN, osteoporosis, kyphoplasty x 2  Brother dec 76 MI  DM,  Sisters 14  73    both with CAD, developed in their late 31s. CV:  (+) Father, sister x 2 HBP:  (+) uncles and mother DM:  (+) self, uncle, GP's, brother CA:  self, bladder Stroke:  (+) GP's No FH of Colon Cancer:  Review of Systems General:  Complains of fatigue; denies chills, fever, sweats, weakness, and weight loss; occas. Eyes:  Denies blurring, discharge, and eye pain. ENT:  Denies decreased hearing, earache, and ringing in ears. CV:  Complains of fatigue and shortness of breath with exertion; denies chest pain or discomfort, fainting, palpitations, swelling of feet, and swelling of hands; rare. Resp:  Complains of cough; denies shortness of breath and wheezing; see HPI. GI:  Denies abdominal pain, bloody stools, change in bowel habits, constipation, dark tarry stools, diarrhea, indigestion, loss of appetite, nausea, vomiting, vomiting blood, and yellowish skin color. GU:  Complains of nocturia and urinary frequency; denies discharge and dysuria; "I gpo all night long". MS:  Complains of low back pain; denies joint pain, muscle aches, and cramps; occas in yard. Derm:  Complains of dryness; denies itching and rash; chronic. Neuro:  Denies memory loss, numbness, poor balance, and tingling.  Physical Exam  General:  Well-developed,well-nourished,in no acute distress; alert,appropriate and cooperative throughout examination, obese,  comfortable and breathing without distress, but mildly coughing. Head:  Normocephalic and atraumatic without obvious abnormalities.  Almost total  balding. Sinuses NT. Eyes:  Conjunctiva clear bilaterally.  Ears:  External ear exam shows no significant lesions or deformities.  Otoscopic examination reveals clear canals, tympanic membranes are intact bilaterally without bulging, retraction, inflammation or discharge. Hearing is grossly normal bilaterally. Nose:  External nasal examination shows no deformity or inflammation. Nasal mucosa are pink and moist without lesions or exudates. L nostril inflamed. Mouth:  Oral mucosa and oropharynx without lesions or exudates.  Teeth in good repair. Neck:  No deformities, masses, or tenderness noted. Chest Wall:  No deformities, masses, tenderness, mild physiologic gynecomastia noted. Breasts:  No masses or gynecomastia noted Lungs:  Normal respiratory effort, chest expands symmetrically. Lungs are clear to auscultation, no crackles or wheezes.  Heart:  Normal rate and regular rhythm. S1 and S2 normal without gallop, murmur, click, rub or other extra sounds. Abdomen:  Bowel sounds positive,abdomen soft and non-tender without masses, organomegaly or hernias noted. Mildly obese and protuberant. Ileostomy intact and functional. Rectal:  No external abnormalities noted. Normal sphincter tone. No rectal masses or tenderness. Black stool from iron. Genitalia:  Testes bilaterally descended without nodularity, tenderness or masses. No scrotal masses or lesions. No penis lesions or urethral discharge. Inflation device in scrotum with prothesis. Prostate:  Absent. Msk:  No deformity or scoliosis noted of thoracic or lumbar spine. Slightly decreased ability to get up out of chair. Pulses:  R and L carotid,radial,femoral,dorsalis pedis and posterior tibial pulses are full and equal bilaterally Extremities:  No clubbing, cyanosis, edema, or deformity noted with normal full range of motion of all joints.   Neurologic:  No cranial nerve deficits noted. Station and gait are normal. Sensory, motor and  coordinative functions appear intact. Skin:  Intact without suspicious lesions or rashes except for abundant AKs on bald head!!! Warned to wear hat. Cervical Nodes:  No lymphadenopathy noted Inguinal Nodes:  No significant adenopathy Psych:  Cognition and judgment appear intact. Alert and cooperative with normal attention span and concentration. No apparent delusions, illusions, hallucinations  Diabetes Management Exam:    Foot Exam (with socks and/or shoes not present):  Sensory-Pinprick/Light touch:          Left medial foot (L-4): normal          Left dorsal foot (L-5): normal          Left lateral foot (S-1): normal          Right medial foot (L-4): normal          Right dorsal foot (L-5): normal          Right lateral foot (S-1): normal       Sensory-Monofilament:          Left foot: normal          Right foot: normal       Inspection:          Left foot: normal          Right foot: normal       Nails:          Left foot: normal          Right foot: normal    Eye Exam:       Eye Exam done elsewhere          Date: 05/06/2009          Results: normal          Done by: Dr Alexia Freestone   Impression & Recommendations:  Problem # 1:  HEALTH MAINTENANCE EXAM (ICD-V70.0) Assessment Comment Only  Reviewed preventive care protocols, scheduled due services, and updated immunizations.  Problem # 2:  DYSPNEA ON EXERTION (ICD-786.09) Assessment: Unchanged  Sounds better from his description and sounds like deconditioning. Encouraged to exercise regularly. His updated medication list for this problem includes:    Hyzaar 100-12.5 Mg Tabs (Losartan potassium-hctz) ..... One tab by mouth in am    Furosemide 20 Mg Tabs (Furosemide) .Marland Kitchen... Take one tablet by mouth daily.    Carvedilol 12.5 Mg Tabs (Carvedilol) .Marland Kitchen... Take one tablet by mouth twice a day  Recommended fluid and salt restriction.   Problem # 3:  RENAL INSUFFICIENCY (ICD-588.9) Assessment: Improved BUN/ CR nml today.  Microalb nml also.  Problem # 4:  ANEMIA-B12 DEFICIENCY (ICD-281.1) Assessment: Improved  Adequately replaced. Would drop one dose of B12 once a week. His updated medication list for this problem includes:    Ferro-bob 325 (65 Fe) Mg Tabs (Ferrous sulfate) .Marland Kitchen... 1 by mouth daily    Vitamin B-12 2500 Mcg Subl (Cyanocobalamin) .Marland Kitchen... 1 daily by mouth  Hgb: 13.5 (12/16/2009)   Hct: 39.2 (12/16/2009)   Platelets: 171.0 (12/16/2009) RBC: 4.17 (12/16/2009)   RDW: 14.2 (12/16/2009)   WBC: 7.3 (12/16/2009) MCV: 94.0 (12/16/2009)   MCHC: 34.4 (12/16/2009) Ferritin: 55.1 (11/02/2006) Iron: 63 (12/16/2009)   % Sat: 14.1 (11/02/2006) B12: 1394 (12/16/2009)   Folate: 14.2 (11/02/2006)   TSH: 3.35 (11/29/2007)  Problem # 5:  CIRRHOSIS (ICD-571.5) Assessment: Improved  Liver fctn back to nml via labs. His updated medication list for this problem includes:    Hyzaar 100-12.5 Mg Tabs (Losartan potassium-hctz) ..... One tab by mouth in am    Furosemide 20 Mg Tabs (Furosemide) .Marland Kitchen... Take one tablet by mouth daily.    Carvedilol 12.5 Mg Tabs (Carvedilol) .Marland Kitchen... Take one tablet by mouth twice a day  Diagnostics Reviewed:  SGOT (AST): 27 (12/16/2009)   SGPT (ALT): 22 (12/16/2009)   T. Bili: 0.8 (12/16/2009)   D. Bili: 0.1 (12/16/2009)   Alk Phos: 86 (12/16/2009) Albumin: 4.4 (12/16/2009)   Iron: 63 (12/16/2009)   Ferritin: 55.1 (  11/02/2006)  Problem # 6:  ANEMIA, IRON DEFICIENCY (ICD-280.9) Assessment: Improved  Iron lvl nml today. His updated medication list for this problem includes:    Ferro-bob 325 (65 Fe) Mg Tabs (Ferrous sulfate) .Marland Kitchen... 1 by mouth daily    Vitamin B-12 2500 Mcg Subl (Cyanocobalamin) .Marland Kitchen... 1 daily by mouth  Hgb: 13.5 (12/16/2009)   Hct: 39.2 (12/16/2009)   Platelets: 171.0 (12/16/2009) RBC: 4.17 (12/16/2009)   RDW: 14.2 (12/16/2009)   WBC: 7.3 (12/16/2009) MCV: 94.0 (12/16/2009)   MCHC: 34.4 (12/16/2009) Ferritin: 55.1 (11/02/2006) Iron: 63 (12/16/2009)   % Sat: 14.1  (11/02/2006) B12: 1394 (12/16/2009)   Folate: 14.2 (11/02/2006)   TSH: 3.35 (11/29/2007)  Problem # 7:  BLADDER CANCER (ILEOSTOMY/PROSTATECTOMY) (ICD-188.9) Assessment: Unchanged Stable.  Problem # 8:  HYPERTENSION (ICD-401.9) Assessment: Deteriorated Presume elevation from decongestant use. Will recheck in the future. His updated medication list for this problem includes:    Hyzaar 100-12.5 Mg Tabs (Losartan potassium-hctz) ..... One tab by mouth in am    Amlodipine Besylate 10 Mg Tabs (Amlodipine besylate) ..... One tab by mouth at night    Furosemide 20 Mg Tabs (Furosemide) .Marland Kitchen... Take one tablet by mouth daily.    Carvedilol 12.5 Mg Tabs (Carvedilol) .Marland Kitchen... Take one tablet by mouth twice a day  BP today: 162/62 Prior BP: 138/64 (06/30/2009)  Labs Reviewed: K+: 4.2 (12/16/2009) Creat: : 1.0 (12/16/2009)   Chol: 96 (12/16/2009)   HDL: 29.10 (12/16/2009)   LDL: 49 (12/16/2009)   TG: 90.0 (12/16/2009)  Problem # 9:  HYPERLIPIDEMIA (ICD-272.4) Assessment: Improved Decrease Simva to 40mg  due to gereat nos and also on Amlodipine. His updated medication list for this problem includes:    Simvastatin 80 Mg Tabs (Simvastatin) .Marland Kitchen... Take 1 tablet by mouth at bedtime  Labs Reviewed: SGOT: 27 (12/16/2009)   SGPT: 22 (12/16/2009)   HDL:29.10 (12/16/2009), 28.30 (12/10/2008)  LDL:49 (12/16/2009), 42 (12/10/2008)  Chol:96 (12/16/2009), 84 (12/10/2008)  Trig:90.0 (12/16/2009), 70.0 (12/10/2008)  Problem # 10:  DIABETES MELLITUS, TYPE II (ICD-250.00) Assessment: Unchanged Good control. Cont as is. His updated medication list for this problem includes:    Metformin Hcl 500 Mg Tabs (Metformin hcl) .Marland Kitchen... 1 by mouth at bedtime    Hyzaar 100-12.5 Mg Tabs (Losartan potassium-hctz) ..... One tab by mouth in am    Adult Aspirin Ec Low Strength 81 Mg Tbec (Aspirin) .Marland Kitchen... 1 by mouth daily  Labs Reviewed: Creat: 1.0 (12/16/2009)   Microalbumin: 139 (06/25/2005)  Last Eye Exam: normal  (05/06/2009) Reviewed HgBA1c results: 6.2 (12/16/2009)  5.3 (06/16/2009)  Complete Medication List: 1)  Metformin Hcl 500 Mg Tabs (Metformin hcl) .Marland Kitchen.. 1 by mouth at bedtime 2)  Simvastatin 80 Mg Tabs (Simvastatin) .... Take 1 tablet by mouth at bedtime 3)  Hyzaar 100-12.5 Mg Tabs (Losartan potassium-hctz) .... One tab by mouth in am 4)  Adult Aspirin Ec Low Strength 81 Mg Tbec (Aspirin) .Marland Kitchen.. 1 by mouth daily 5)  Amlodipine Besylate 10 Mg Tabs (Amlodipine besylate) .... One tab by mouth at night 6)  Ferro-bob 325 (65 Fe) Mg Tabs (Ferrous sulfate) .Marland Kitchen.. 1 by mouth daily 7)  Diprolene Oint (Aug betamethasone dipropionate oint) .... As needed 8)  Onetouch Ultra Test Strp (Glucose blood) .Marland Kitchen.. 1 daily as directed// icd-9 code 250.00 9)  Onetouch Lancets Misc (Lancets) .Marland Kitchen.. 1 daily and as needed/ icd-9 code 250.00 10)  Potassium Gluconate 595 Mg Cr-tabs (Potassium gluconate) .Marland Kitchen.. 1 tablet by mouth once daily 11)  Vitamin B-12 2500 Mcg Subl (Cyanocobalamin) .Marland Kitchen.. 1 daily  by mouth 12)  Tylenol 325 Mg Tabs (Acetaminophen) .... As needed 13)  Furosemide 20 Mg Tabs (Furosemide) .... Take one tablet by mouth daily. 14)  Carvedilol 12.5 Mg Tabs (Carvedilol) .... Take one tablet by mouth twice a day  Other Orders: Flu Vaccine 9yrs + MEDICARE PATIENTS (N2355) Administration Flu vaccine - MCR (D3220)  Patient Instructions: 1)  RTC 1 mo for BP check. 2)  RTC 6 mos, A1C prior. 3)  Take Guaifenesin by going to [facility] or [facility] and getting MUCOUS RELIEF EXPECTORANT (400mg ), take 11/2 tabs by mouth AM and NOON. 4)  Drink lots of fluids anytime taking Guaifenesin.  5)  Flu shot today.   Orders Added: 1)  Flu Vaccine 1yrs + MEDICARE PATIENTS [Q2039] 2)  Administration Flu vaccine - MCR [G0008] 3)  Est. Patient 65& > [25427]     Current Allergies (reviewed today): No known allergies   Flu Vaccine Consent Questions     Do you have a history of severe allergic reactions to this  vaccine? no    Any prior history of allergic reactions to egg and/or gelatin? no    Do you have a sensitivity to the preservative Thimersol? no    Do you have a past history of Guillan-Barre Syndrome? no    Do you currently have an acute febrile illness? no    Have you ever had a severe reaction to latex? no    Vaccine information given and explained to patient? yes    Are you currently pregnant? no    Lot Number:[id number]   Exp Date:07/25/2010   Site Given  Left Deltoid [email or web address]

## 2010-02-24 NOTE — Assessment & Plan Note (Signed)
Summary: REMOVAL OF SKIN TAGS-15MIN PER DR S. CYD   Vital Signs:  Patient profile:   77 year old male Weight:      228.50 pounds Temp:     98.5 degrees F oral Pulse rate:   64 / minute Pulse rhythm:   regular BP sitting:   138 / 64  (left arm) Cuff size:   large  Vitals Entered By: Sydell Axon LPN (July 01, 1306 11:53 AM) CC: Removal of skin tag from neck   History of Present Illness: Pt here for the removal of a skin lesion from the lower neck over the Adam's apple which is consistently irritated from shaving, often bleeds and in general causes almost daily concern.  Problems Prior to Update: 1)  Skin Tag, Irritated  (ICD-701.9) 2)  Syncope  (ICD-780.2) 3)  Diarrhea  (ICD-787.91) 4)  Dyspnea On Exertion  (ICD-786.09) 5)  Renal Insufficiency  (ICD-588.9) 6)  Anemia-b12 Deficiency  (ICD-281.1) 7)  Cirrhosis  (ICD-571.5) 8)  Shoulder Strain, Right Deltoid  (ICD-840.9) 9)  Inguinal Hernia, Left  (ICD-550.90) 10)  Anemia, Iron Deficiency  (ICD-280.9) 11)  Pyloric Stenosis  (ICD-750.5) 12)  Hemorrhoids, Internal  (ICD-455.0) 13)  Varices, Site Nec (GASTRIC)  (ICD-456.8) 14)  Back Pain  (ICD-724.5) 15)  Blood in Stool (INT HEMMS)  (ICD-578.1) 16)  Screening For Malignannt Neoplasm, Site Nec  (ICD-V76.49) 17)  Dependent Edema, Legs  (ICD-782.3) 18)  Screening For Malignant Neoplasm, Prostate  (ICD-V76.44) 19)  Bladder Cancer (ILEOSTOMY/PROSTATECTOMY)  (ICD-188.9) 20)  Aortic Insufficiency  (ICD-424.1) 21)  Hypertension  (ICD-401.9) 22)  Hyperlipidemia  (ICD-272.4) 23)  Diabetes Mellitus, Type II  (ICD-250.00)  Medications Prior to Update: 1)  Metformin Hcl 500 Mg Tabs (Metformin Hcl) .Marland Kitchen.. 1 By Mouth At Bedtime 2)  Simvastatin 80 Mg Tabs (Simvastatin) .... Take 1 Tablet By Mouth At Bedtime 3)  Hyzaar 100-12.5 Mg Tabs (Losartan Potassium-Hctz) .... One Tab By Mouth in Am 4)  Adult Aspirin Ec Low Strength 81 Mg  Tbec (Aspirin) .Marland Kitchen.. 1 By Mouth Daily 5)  Amlodipine Besylate 10  Mg Tabs (Amlodipine Besylate) .... One Tab By Mouth At Night 6)  Ferro-Bob 325 (65 Fe) Mg  Tabs (Ferrous Sulfate) .Marland Kitchen.. 1 By Mouth Daily 7)  Diprolene   Oint (Aug Betamethasone Dipropionate Oint) .... As Needed 8)  Onetouch Ultra Test   Strp (Glucose Blood) .Marland Kitchen.. 1 Daily As Directed// Icd-9 Code 250.00 9)  Onetouch Lancets   Misc (Lancets) .Marland Kitchen.. 1 Daily and As Needed/ Icd-9 Code 250.00 10)  Potassium Gluconate 595 Mg Cr-Tabs (Potassium Gluconate) .Marland Kitchen.. 1 Tablet By Mouth Once Daily 11)  Vitamin B-12 2500 Mcg Subl (Cyanocobalamin) .Marland Kitchen.. 1 Daily By Mouth 12)  Tylenol 325 Mg Tabs (Acetaminophen) .... As Needed 13)  Furosemide 20 Mg Tabs (Furosemide) .... Take One Tablet By Mouth Daily. 14)  Carvedilol 12.5 Mg Tabs (Carvedilol) .... Take One Tablet By Mouth Twice A Day  Allergies: No Known Drug Allergies  Physical Exam  General:  Well-developed,well-nourished,in no acute distress; alert,appropriate and cooperative throughout examination, obese,  comfortable and breathing without distress, but mildly congested. Head:  Normocephalic and atraumatic without obvious abnormalities. Almost total  balding. Sinuses NT. Eyes:  Conjunctiva clear bilaterally.  Ears:  External ear exam shows no significant lesions or deformities.  Otoscopic examination reveals clear canals, tympanic membranes are intact bilaterally without bulging, retraction, inflammation or discharge. Hearing is grossly normal bilaterally. Nose:  External nasal examination shows no deformity or inflammation. Nasal mucosa are pink and  moist without lesions or exudates. L nostril inflamed. Mouth:  Oral mucosa and oropharynx without lesions or exudates.  Teeth in good repair. Skin:  Anterior neck prepped in usual sterile manner. @% lido w/ epi used for local. Lesion clipped with scissors and cauterized with hyfrecation. Sterile dressing with neosporin placed.   Impression & Recommendations:  Problem # 1:  SKIN TAG, IRRITATED  (ICD-701.9) Assessment Improved  Removed.  Orders: Removal of Skin Tags up to 15 Lesions (11200)  Complete Medication List: 1)  Metformin Hcl 500 Mg Tabs (Metformin hcl) .Marland Kitchen.. 1 by mouth at bedtime 2)  Simvastatin 80 Mg Tabs (Simvastatin) .... Take 1 tablet by mouth at bedtime 3)  Hyzaar 100-12.5 Mg Tabs (Losartan potassium-hctz) .... One tab by mouth in am 4)  Adult Aspirin Ec Low Strength 81 Mg Tbec (Aspirin) .Marland Kitchen.. 1 by mouth daily 5)  Amlodipine Besylate 10 Mg Tabs (Amlodipine besylate) .... One tab by mouth at night 6)  Ferro-bob 325 (65 Fe) Mg Tabs (Ferrous sulfate) .Marland Kitchen.. 1 by mouth daily 7)  Diprolene Oint (Aug betamethasone dipropionate oint) .... As needed 8)  Onetouch Ultra Test Strp (Glucose blood) .Marland Kitchen.. 1 daily as directed// icd-9 code 250.00 9)  Onetouch Lancets Misc (Lancets) .Marland Kitchen.. 1 daily and as needed/ icd-9 code 250.00 10)  Potassium Gluconate 595 Mg Cr-tabs (Potassium gluconate) .Marland Kitchen.. 1 tablet by mouth once daily 11)  Vitamin B-12 2500 Mcg Subl (Cyanocobalamin) .Marland Kitchen.. 1 daily by mouth 12)  Tylenol 325 Mg Tabs (Acetaminophen) .... As needed 13)  Furosemide 20 Mg Tabs (Furosemide) .... Take one tablet by mouth daily. 14)  Carvedilol 12.5 Mg Tabs (Carvedilol) .... Take one tablet by mouth twice a day  Patient Instructions: 1)  Keep clean and dryfor 24 hrs.  2)  RTC if erythema or swelling ensue.  Current Allergies (reviewed today): No known allergies

## 2010-02-24 NOTE — Assessment & Plan Note (Signed)
Summary: f/u after change in BP meds per Dr. Alvester Chou   Vital Signs:  Patient profile:   77 year old male Weight:      230.75 pounds Temp:     98.5 degrees F oral Pulse rate:   72 / minute Pulse rhythm:   regular BP sitting:   134 / 60  (left arm) Cuff size:   large  Vitals Entered By: Sydell Axon LPN (May 05, 2009 11:59 AM) CC: Follow-up on BP medication change, has a cold, non-productive cough   History of Present Illness: Pt here for having switched Avalide to Hyzaar. He took his first dose on Mar first. He got diarrhea in a few days after doing that with lots of gas. He still has the sxs but but not as bad and it is coming and going. He was switched due to insurance.  His sugar jumped up to 306 one day and he started checking it  regularly daily fasting and it has gradulally come down to 160s, lowest 130. He has also occas checked nighttime no and getds 180. He feels fine,. He has lost 9 pounds since being seen, probably from the diarrhea as he says he has not really changed anything.  Problems Prior to Update: 1)  Dyspnea On Exertion  (ICD-786.09) 2)  Renal Insufficiency  (ICD-588.9) 3)  Anemia-b12 Deficiency  (ICD-281.1) 4)  Cirrhosis  (ICD-571.5) 5)  Shoulder Strain, Right Deltoid  (ICD-840.9) 6)  Inguinal Hernia, Left  (ICD-550.90) 7)  Anemia, Iron Deficiency  (ICD-280.9) 8)  Pyloric Stenosis  (ICD-750.5) 9)  Hemorrhoids, Internal  (ICD-455.0) 10)  Varices, Site Nec (GASTRIC)  (ICD-456.8) 11)  Back Pain  (ICD-724.5) 12)  Blood in Stool (INT HEMMS)  (ICD-578.1) 13)  Screening For Malignannt Neoplasm, Site Nec  (ICD-V76.49) 14)  Dependent Edema, Legs  (ICD-782.3) 15)  Screening For Malignant Neoplasm, Prostate  (ICD-V76.44) 16)  Bladder Cancer (ILEOSTOMY/PROSTATECTOMY)  (ICD-188.9) 17)  Aortic Insufficiency  (ICD-424.1) 18)  Hypertension  (ICD-401.9) 19)  Hyperlipidemia  (ICD-272.4) 20)  Diabetes Mellitus, Type II  (ICD-250.00)  Medications Prior to Update: 1)   Metformin Hcl 500 Mg Tabs (Metformin Hcl) .Marland Kitchen.. 1 By Mouth At Bedtime 2)  Simvastatin 80 Mg Tabs (Simvastatin) .... Take 1 Tablet By Mouth At Bedtime 3)  Hyzaar 100-25 Mg Tabs (Losartan Potassium-Hctz) .... One Tab By Mouth in Am 4)  Adult Aspirin Ec Low Strength 81 Mg  Tbec (Aspirin) .Marland Kitchen.. 1 By Mouth Daily 5)  Amlodipine Besylate 10 Mg Tabs (Amlodipine Besylate) .... One Tab By Mouth At Night 6)  Ferro-Bob 325 (65 Fe) Mg  Tabs (Ferrous Sulfate) .Marland Kitchen.. 1 By Mouth Daily 7)  Diprolene   Oint (Aug Betamethasone Dipropionate Oint) .... As Needed 8)  Onetouch Ultra Test   Strp (Glucose Blood) .Marland Kitchen.. 1 Daily As Directed// Icd-9 Code 250.00 9)  Onetouch Lancets   Misc (Lancets) .Marland Kitchen.. 1 Daily and As Needed/ Icd-9 Code 250.00 10)  Potassium Gluconate 595 Mg Cr-Tabs (Potassium Gluconate) .Marland Kitchen.. 1 Tablet By Mouth Once Daily 11)  Vitamin B-12 2500 Mcg Subl (Cyanocobalamin) .Marland Kitchen.. 1 Daily By Mouth 12)  Tylenol 325 Mg Tabs (Acetaminophen) .... As Needed 13)  Furosemide 20 Mg Tabs (Furosemide) .... Take One Tablet By Mouth Daily. 14)  Carvedilol 12.5 Mg Tabs (Carvedilol) .... Take One Tablet By Mouth Twice A Day  Allergies: No Known Drug Allergies  Physical Exam  General:  Well-developed,well-nourished,in no acute distress; alert,appropriate and cooperative throughout examination, obese,  comfortable and breathing without distress, but mildly  congested. Head:  Normocephalic and atraumatic without obvious abnormalities. Almost total  balding. Sinuses NT. Eyes:  Conjunctiva clear bilaterally.  Ears:  External ear exam shows no significant lesions or deformities.  Otoscopic examination reveals clear canals, tympanic membranes are intact bilaterally without bulging, retraction, inflammation or discharge. Hearing is grossly normal bilaterally. Nose:  External nasal examination shows no deformity or inflammation. Nasal mucosa are pink and moist without lesions or exudates. L nostril inflamed. Mouth:  Oral mucosa and  oropharynx without lesions or exudates.  Teeth in good repair. Neck:  No deformities, masses, or tenderness noted. Lungs:  Normal respiratory effort, chest expands symmetrically. Lungs are clear to auscultation, no crackles or wheezes. Fine wheeze cleared after second deep breath. Heart:  Normal rate and regular rhythm. S1 and S2 normal without gallop, murmur, click, rub or other extra sounds.   Impression & Recommendations:  Problem # 1:  HYPERTENSION (ICD-401.9) Assessment Unchanged Stable after changing from Avalide to Hyzaar. Labs nml .   Will take away HCTZ part of med and recheck at PE. See if helps diarrhea at all. His updated medication list for this problem includes:    Cozaar 100 Mg Tabs (Losartan potassium) ..... One tab by mouth daily.    Amlodipine Besylate 10 Mg Tabs (Amlodipine besylate) ..... One tab by mouth at night    Furosemide 20 Mg Tabs (Furosemide) .Marland Kitchen... Take one tablet by mouth daily.    Carvedilol 12.5 Mg Tabs (Carvedilol) .Marland Kitchen... Take one tablet by mouth twice a day  Problem # 2:  DIARRHEA (ICD-787.91) Assessment: New Will change Hyzaar to Cozaar to see if will affect diarrhea.   Problem # 3:  DIABETES MELLITUS, TYPE II (ICD-250.00) Assessment: Unchanged ont to monitor. Do nothing today but may need to adjust next time. His updated medication list for this problem includes:    Metformin Hcl 500 Mg Tabs (Metformin hcl) .Marland Kitchen... 1 by mouth at bedtime    Cozaar 100 Mg Tabs (Losartan potassium) ..... One tab by mouth daily.    Adult Aspirin Ec Low Strength 81 Mg Tbec (Aspirin) .Marland Kitchen... 1 by mouth daily  Complete Medication List: 1)  Metformin Hcl 500 Mg Tabs (Metformin hcl) .Marland Kitchen.. 1 by mouth at bedtime 2)  Simvastatin 80 Mg Tabs (Simvastatin) .... Take 1 tablet by mouth at bedtime 3)  Cozaar 100 Mg Tabs (Losartan potassium) .... One tab by mouth daily. 4)  Adult Aspirin Ec Low Strength 81 Mg Tbec (Aspirin) .Marland Kitchen.. 1 by mouth daily 5)  Amlodipine Besylate 10 Mg Tabs  (Amlodipine besylate) .... One tab by mouth at night 6)  Ferro-bob 325 (65 Fe) Mg Tabs (Ferrous sulfate) .Marland Kitchen.. 1 by mouth daily 7)  Diprolene Oint (Aug betamethasone dipropionate oint) .... As needed 8)  Onetouch Ultra Test Strp (Glucose blood) .Marland Kitchen.. 1 daily as directed// icd-9 code 250.00 9)  Onetouch Lancets Misc (Lancets) .Marland Kitchen.. 1 daily and as needed/ icd-9 code 250.00 10)  Potassium Gluconate 595 Mg Cr-tabs (Potassium gluconate) .Marland Kitchen.. 1 tablet by mouth once daily 11)  Vitamin B-12 2500 Mcg Subl (Cyanocobalamin) .Marland Kitchen.. 1 daily by mouth 12)  Tylenol 325 Mg Tabs (Acetaminophen) .... As needed 13)  Furosemide 20 Mg Tabs (Furosemide) .... Take one tablet by mouth daily. 14)  Carvedilol 12.5 Mg Tabs (Carvedilol) .... Take one tablet by mouth twice a day  Patient Instructions: 1)  Keep appt. as scheduled. Prescriptions: COZAAR 100 MG TABS (LOSARTAN POTASSIUM) one tab by mouth daily.  #30 x 12   Entered and Authorized by:  Shaune Leeks MD   Signed by:   Shaune Leeks MD on 05/05/2009   Method used:   Electronically to        [facility]. [address] [phone]* (retail)       [address] [place], [place]  [id number]       Ph: [phone]       Fax: [phone]   RxID:   [id number]   Current Allergies (reviewed today): No known allergies

## 2010-02-24 NOTE — Letter (Signed)
Summary: [facility] Ost[facility] Supplies Order  [facility] Equipment Ostomy Supplies Order   Imported By: Beau Fanny 03/25/2009 11:48:00  _____________________________________________________________________  External Attachment:    Type:   Image     Comment:   External Document

## 2010-02-26 NOTE — Assessment & Plan Note (Signed)
Summary: FOLLOW-UP FOR B/P/JRR   Vital Signs:  Patient profile:   77 year old male Height:      69 inches Weight:      231.50 pounds BMI:     34.31 Temp:     98.2 degrees F oral Pulse rate:   72 / minute Pulse rhythm:   regular BP sitting:   146 / 60  (left arm) Cuff size:   large  Vitals Entered By: Delilah Shan CMA Duncan Dull) (January 21, 2010 10:08 AM) CC: Follow up - BP   History of Present Illness: Pt here for BP recheck.   He has lots of stress at home but feels fine. aHe monitors his BP at home and it has been mildly elevated since being here last time. He has tolerated the new medications from Cardiology last visit fine.   Problems Prior to Update: 1)  Health Maintenance Exam  (ICD-V70.0) 2)  Dyspnea On Exertion  (ICD-786.09) 3)  Renal Insufficiency  (ICD-588.9) 4)  Anemia-b12 Deficiency  (ICD-281.1) 5)  Cirrhosis  (ICD-571.5) 6)  Shoulder Strain, Right Deltoid  (ICD-840.9) 7)  Inguinal Hernia, Left  (ICD-550.90) 8)  Anemia, Iron Deficiency  (ICD-280.9) 9)  Pyloric Stenosis  (ICD-750.5) 10)  Hemorrhoids, Internal  (ICD-455.0) 11)  Varices, Site Nec (GASTRIC)  (ICD-456.8) 12)  Blood in Stool (INT HEMMS)  (ICD-578.1) 13)  Screening For Malignannt Neoplasm, Site Nec  (ICD-V76.49) 14)  Screening For Malignant Neoplasm, Prostate  (ICD-V76.44) 15)  Bladder Cancer (ILEOSTOMY/PROSTATECTOMY)  (ICD-188.9) 16)  Aortic Insufficiency  (ICD-424.1) 17)  Hypertension  (ICD-401.9) 18)  Hyperlipidemia  (ICD-272.4) 19)  Diabetes Mellitus, Type II  (ICD-250.00)  Medications Prior to Update: 1)  Metformin Hcl 500 Mg Tabs (Metformin Hcl) .Marland Kitchen.. 1 By Mouth At Bedtime 2)  Simvastatin 80 Mg Tabs (Simvastatin) .... Take 1 Tablet By Mouth At Bedtime 3)  Hyzaar 100-12.5 Mg Tabs (Losartan Potassium-Hctz) .... One Tab By Mouth in Am 4)  Adult Aspirin Ec Low Strength 81 Mg  Tbec (Aspirin) .Marland Kitchen.. 1 By Mouth Daily 5)  Amlodipine Besylate 10 Mg Tabs (Amlodipine Besylate) .... One Tab By Mouth At  Night 6)  Ferro-Bob 325 (65 Fe) Mg  Tabs (Ferrous Sulfate) .Marland Kitchen.. 1 By Mouth Daily 7)  Diprolene   Oint (Aug Betamethasone Dipropionate Oint) .... As Needed 8)  Onetouch Ultra Test   Strp (Glucose Blood) .Marland Kitchen.. 1 Daily As Directed// Icd-9 Code 250.00 9)  Onetouch Lancets   Misc (Lancets) .Marland Kitchen.. 1 Daily and As Needed/ Icd-9 Code 250.00 10)  Potassium Gluconate 595 Mg Cr-Tabs (Potassium Gluconate) .Marland Kitchen.. 1 Tablet By Mouth Once Daily 11)  Vitamin B-12 2500 Mcg Subl (Cyanocobalamin) .Marland Kitchen.. 1 Daily By Mouth 12)  Tylenol 325 Mg Tabs (Acetaminophen) .... As Needed 13)  Furosemide 20 Mg Tabs (Furosemide) .... Take One Tablet By Mouth Daily. 14)  Carvedilol 12.5 Mg Tabs (Carvedilol) .... Take One Tablet By Mouth Twice A Day  Allergies: No Known Drug Allergies  Physical Exam  General:  Well-developed,well-nourished,in no acute distress; alert,appropriate and cooperative throughout examination, obese,  comfortable and breathing without distress, but mildly coughing. Head:  Normocephalic and atraumatic without obvious abnormalities. Almost total  balding. Sinuses NT. Eyes:  Conjunctiva clear bilaterally.  Ears:  External ear exam shows no significant lesions or deformities.  Otoscopic examination reveals clear canals, tympanic membranes are intact bilaterally without bulging, retraction, inflammation or discharge. Hearing is grossly normal bilaterally. Nose:  External nasal examination shows no deformity or inflammation. Nasal mucosa are pink and moist without  lesions or exudates.  Mouth:  Oral mucosa and oropharynx without lesions or exudates.  Teeth in good repair. Neck:  No deformities, masses, or tenderness noted. Chest Wall:  No deformities, masses, tenderness, mild physiologic gynecomastia noted. Lungs:  Normal respiratory effort, chest expands symmetrically. Lungs are clear to auscultation, no crackles or wheezes.  Heart:  Normal rate and regular rhythm. S1 and S2 normal without gallop, murmur, click, rub  or other extra sounds.   Impression & Recommendations:  Problem # 1:  HYPERTENSION (ICD-401.9) Assessment Improved Slightly better but still high so will readjust meds. Stop HCTZ due to also on Lasix and increase Coreg.  This should be easier on his kidneys.  His updated medication list for this problem includes:    Cozaar 100 Mg Tabs (Losartan potassium) ..... One tab by mouth daily    Amlodipine Besylate 10 Mg Tabs (Amlodipine besylate) ..... One tab by mouth at night    Furosemide 20 Mg Tabs (Furosemide) .Marland Kitchen... Take one tablet by mouth daily.    Carvedilol 25 Mg Tabs (Carvedilol) ..... One tab by mouth two times a day  BP today: 146/60 Prior BP: 162/62 (12/24/2009)  Labs Reviewed: K+: 4.2 (12/16/2009) Creat: : 1.0 (12/16/2009)   Chol: 96 (12/16/2009)   HDL: 29.10 (12/16/2009)   LDL: 49 (12/16/2009)   TG: 90.0 (12/16/2009)  Problem # 2:  RENAL INSUFFICIENCY (ICD-588.9) Assessment: Unchanged Stop HCTZ and cont Lasix. Incr Coreg. This combo and hopefully better BP control should be better for his kidneys. Will recheck fctn next visit in May.  Complete Medication List: 1)  Metformin Hcl 500 Mg Tabs (Metformin hcl) .Marland Kitchen.. 1 by mouth at bedtime 2)  Simvastatin 80 Mg Tabs (Simvastatin) .... Take 1/2  tablet by mouth at bedtime 3)  Cozaar 100 Mg Tabs (Losartan potassium) .... One tab by mouth daily 4)  Adult Aspirin Ec Low Strength 81 Mg Tbec (Aspirin) .Marland Kitchen.. 1 by mouth daily 5)  Amlodipine Besylate 10 Mg Tabs (Amlodipine besylate) .... One tab by mouth at night 6)  Ferro-bob 325 (65 Fe) Mg Tabs (Ferrous sulfate) .Marland Kitchen.. 1 by mouth daily 7)  Diprolene Oint (Aug betamethasone dipropionate oint) .... As needed 8)  Onetouch Ultra Test Strp (Glucose blood) .Marland Kitchen.. 1 daily as directed// icd-9 code 250.00 9)  Onetouch Lancets Misc (Lancets) .Marland Kitchen.. 1 daily and as needed/ icd-9 code 250.00 10)  Potassium Gluconate 595 Mg Cr-tabs (Potassium gluconate) .Marland Kitchen.. 1 tablet by mouth once daily 11)  Vitamin B-12  2500 Mcg Subl (Cyanocobalamin) .Marland Kitchen.. 1 daily by mouth 12)  Tylenol 325 Mg Tabs (Acetaminophen) .... As needed 13)  Furosemide 20 Mg Tabs (Furosemide) .... Take one tablet by mouth daily. 14)  Carvedilol 25 Mg Tabs (Carvedilol) .... One tab by mouth two times a day  Patient Instructions: 1)  Keep appt in May for labs and recheck appt. Bmet with A1C prior  Prescriptions: CARVEDILOL 25 MG TABS (CARVEDILOL) one tab by mouth two times a day  #60 x 12   Entered and Authorized by:   Shaune Leeks MD   Signed by:   Shaune Leeks MD on 01/21/2010   Method used:   Print then Give to Patient   RxID:   [id number] COZAAR 100 MG TABS (LOSARTAN POTASSIUM) one tab by mouth daily  #30 x 0   Entered and Authorized by:   Shaune Leeks MD   Signed by:   Shaune Leeks MD on 01/21/2010   Method used:   Print then Give  to Patient   RxID:   [id number]    Orders Added: 1)  Est. Patient Level III [46270]    Current Allergies (reviewed today): No known allergies

## 2010-06-09 ENCOUNTER — Inpatient Hospital Stay: Payer: Self-pay | Admitting: Internal Medicine

## 2010-06-09 NOTE — Assessment & Plan Note (Signed)
[facility]                         GASTROENTEROLOGY OFFICE NOTE   NAME:[name], [name]                          MRN:          [id number]  DATE:11/15/2006                            DOB:          04-23-1933    Mr. Benko returns today with no gastrointestinal complaints. His  serologic workup for livers disease was negative. He does not have a  history of alcohol abuse or heavy usage. Iron studies revealed a low  saturation at 14% and he is currently taking iron replacement twice a  day. His vitamin B 12 level was low at 200 with a normal range being 211  and higher. He does not have any ongoing symptoms from his left inguinal  hernia and he has been evaluated by a surgeon in the past and expectant  management was recommended.   CURRENT MEDICATIONS:  As listed on the chart; updated and reviewed.   MEDICATION ALLERGIES:  None known.   PHYSICAL EXAMINATION:  In no acute distress. Weight is 238 pounds. Blood  pressure 146/60, pulse 60 and regular.  CHEST: Clear to auscultation bilaterally.  CARDIAC: Regular rate and rhythm without murmurs appreciated.  ABDOMEN: Soft and nontender. Normoactive bowel sounds. No palpable  organomegaly, masses or hernias.   ASSESSMENT/PLAN:  1. Suspected cirrhosis with gastric varices noted on endoscopy. The      etiology of the cirrhosis is not clear. I have advised him to      minimize alcohol intake for the longterm, although I do not feel      that this was the etiology of this disease. I have advised him to      proceed with a liver biopsy for further evaluation and he declines      at this time. I think it is reasonable to follow his liver function      tests and prothrombin time every six months. We can proceed with      liver biopsy in the future if he develops signs or symptoms of      worsening portal hypertension. We can also consider the addition of      a beta-blocker to help decrease the risk of  gastrointestinal      bleeding from varicies.  2. Left inguinal hernia. Minimal symptoms. Expectant management. He      may return to his general surgeon p.r.n.  3. Iron deficiency anemia. Currently on iron replacement, further      followup with Dr.  Hetty Ely.  4. Low serum B12 level. Ongoing management and further followup with      Dr.  Hetty Ely.     Venita Lick. Russella Dar, MD, [facility]  Electronically Signed    MTS/MedQ  DD: 11/15/2006  DT: 11/15/2006  Job #: [id number]   cc:   Arta Silence, MD

## 2010-06-09 NOTE — Assessment & Plan Note (Signed)
[facility]                         GASTROENTEROLOGY OFFICE NOTE   NAME:[name], [name]                          MRN:          [id number]  DATE:10/12/2006                            DOB:          [birth year]    REASON FOR CONSULTATION:  Hemoccult positive stool and a history of  anemia.   HISTORY OF PRESENT ILLNESS:  [name] is a 77 year old white male  referred through the courtesy of Dr. Hetty Ely.  I previously evaluated  him for Hemoccult positive stool in May of 2005.  He underwent  colonoscopy in June 2005 that was complete only to the transverse colon.  He subsequently underwent a double contrast barium enema which showed  tortuosity of the sigmoid colon with a loop extending into a left  inguinal hernia.  The right colon was tortuous.  There was a small  amount of retained feces throughout the colon and a small polypoid  defect could not be excluded.  He has a history of bladder cancer with  an ileal conduit. He relates intermittent problems with very small  amounts of bright red blood per rectum associated with bowel movements.  He was diagnosed with anemia about a year ago and he has been maintained  on iron.  His most recent CBC on July 18, 2006 showed a hemoglobin of  12.3 and a normal MCV.  He relates no abdominal pain, dysphagia,  odynophagia, reflux symptoms, nausea, vomiting, melena, change in bowel  habits, constipation, diarrhea or change in stool caliber.  He does note  his stools are occasionally darker and harder since he has been taking  iron.  There is no family history of colon cancer, colon polyps or  inflammatory bowel disease.   PAST MEDICAL HISTORY:  1. Diabetes mellitus since 1995.  2. Hypertension since 1995.  3. Aortic insufficiency murmur since 1997.  4. Bladder cancer status post an ileal conduit 1984.  5. Status post cystectomy.  6. Status post penile prosthesis 1991.  7. Hyperlipidemia.  8. Internal  hemorrhoids.   CURRENT MEDICATIONS:  1. Metformin 500 mg nightly.  2. Simvastatin 80 mg nightly.  3. Diltiazem 240 mg daily.  4. Actos 30 mg daily.  5. Avalide 300/25 one q.a.m.  6. Aspirin 81 mg daily.  7. Norvasc 5 mg daily.  8. Ferrous sulfate 325 mg b.i.d.  9. Potassium gluconate 595 daily.   MEDICATION ALLERGIES:  NONE KNOWN.   SOCIAL HISTORY:  Per the handwritten form   REVIEW OF SYSTEMS:  Per the handwritten form.   PHYSICAL EXAMINATION:  Overweight white male in no acute distress,  height 5 feet 10 inches, weight 239 pounds, blood pressure is 144/70,  pulse 64 and regular.  HEENT:  Anicteric sclerae, oropharynx clear.  CHEST:  Clear to auscultation bilaterally.  CARDIAC:  Regular rate and rhythm without murmurs appreciated.  ABDOMEN:  Soft, nontender, nondistended, normal active bowel sounds.  No  palpable organomegaly, masses or hernias.  Right lower quadrant ostomy  noted.  RECTAL:  Deferred to time of colonoscopy.  EXTREMITIES:  Without clubbing, cyanosis or edema  NEUROLOGIC:  Alert and oriented x3.  Grossly nonfocal.   ASSESSMENT/PLAN:  Hemoccult positive stools, intermittent small volume  hematochezia and anemia.  Rule out colorectal neoplasms, arteriovenous  malformations, ulcer disease, hemorrhoids and other disorders.  His  prior colonoscopy was incomplete due to a tortuous colon and patient  discomfort.  I suspected he had adhesions.  There is also a question of  a left inguinal hernia noted on BE although I can not appreciate one on  exam today.  Risks, benefits and alternative to colonoscopy with  possible biopsy and possible polypectomy and upper endoscopy with  possible biopsy discussed with the patient he consents to proceed.  The  procedures will be scheduled electively.  He understands that the  colonoscopy may be incomplete this time as well and we will consider a  CT colonography or air contrast barium enema if his colonoscopy is not  complete.   He may continue to use Anusol HC suppositories for active  hemorrhoidal symptoms.     Venita Lick. Russella Dar, MD, [facility]  Electronically Signed    MTS/MedQ  DD: 10/12/2006  DT: 10/12/2006  Job #: [id number]   cc:   Arta Silence, MD

## 2010-06-12 DIAGNOSIS — I4891 Unspecified atrial fibrillation: Secondary | ICD-10-CM

## 2010-06-13 ENCOUNTER — Encounter: Payer: Self-pay | Admitting: Family Medicine

## 2010-06-17 ENCOUNTER — Other Ambulatory Visit: Payer: Self-pay

## 2010-06-18 ENCOUNTER — Other Ambulatory Visit: Payer: Self-pay | Admitting: Family Medicine

## 2010-06-23 ENCOUNTER — Ambulatory Visit (INDEPENDENT_AMBULATORY_CARE_PROVIDER_SITE_OTHER): Payer: PRIVATE HEALTH INSURANCE | Admitting: Cardiovascular Disease

## 2010-06-23 ENCOUNTER — Encounter: Payer: Self-pay | Admitting: Cardiovascular Disease

## 2010-06-23 DIAGNOSIS — N259 Disorder resulting from impaired renal tubular function, unspecified: Secondary | ICD-10-CM

## 2010-06-23 DIAGNOSIS — R0989 Other specified symptoms and signs involving the circulatory and respiratory systems: Secondary | ICD-10-CM

## 2010-06-23 DIAGNOSIS — E785 Hyperlipidemia, unspecified: Secondary | ICD-10-CM

## 2010-06-23 DIAGNOSIS — I359 Nonrheumatic aortic valve disorder, unspecified: Secondary | ICD-10-CM

## 2010-06-23 DIAGNOSIS — I10 Essential (primary) hypertension: Secondary | ICD-10-CM

## 2010-06-23 DIAGNOSIS — I4891 Unspecified atrial fibrillation: Secondary | ICD-10-CM

## 2010-06-23 DIAGNOSIS — E119 Type 2 diabetes mellitus without complications: Secondary | ICD-10-CM

## 2010-06-23 DIAGNOSIS — R0609 Other forms of dyspnea: Secondary | ICD-10-CM

## 2010-06-23 HISTORY — DX: Unspecified atrial fibrillation: I48.91

## 2010-06-23 NOTE — Assessment & Plan Note (Signed)
Currently maintaining atrial Fibrillation with well-controlled rate. We'll continue him on his current medications. We did discuss possibly doing a cardioversion in several weeks' time. He would like to think about this and will call us if he would like this to be arranged at the end of June.

## 2010-06-23 NOTE — Patient Instructions (Signed)
You are doing well. No medication changes were made. Please call us if you have new issues that need to be addressed before your next appt.  We will call you for a follow up Appt. In 6 months  

## 2010-06-23 NOTE — Assessment & Plan Note (Signed)
Cholesterol is at goal on the current lipid regimen. No changes to the medications were made.  

## 2010-06-23 NOTE — Assessment & Plan Note (Signed)
Shortness of breath has significantly improved with treatment of his pneumonia. Lungs are significantly improved and now with minimal wheezing if any at all. We have suggested he stay on his current medication. He is follow up with Dr. Meredeth Ide next week

## 2010-06-23 NOTE — Assessment & Plan Note (Signed)
We have encouraged continued exercise, careful diet management in an effort to lose weight. 

## 2010-06-23 NOTE — Progress Notes (Signed)
   Patient ID: [name], male    DOB: 10/13/1933, 77 y.o.   MRN: [id number]  HPI Comments: Mr. Zawistowski is a very pleasant 77 your gentleman with a long history of smoking, suspected COPD, no known coronary artery disease, who was recent he brought in by EMS to the hospital in mid May with respiratory distress, started on BiPAP with chest x-ray showing right lower lobe pneumonia, new atrial fibrillation. We will consult at that time for management and he presents now in the office for followup.  He was treated with a long course of antibiotics, steroids, nebulizer treatments and now has home oxygen. He reports that his breathing has significantly improved. His heart rate in nature fibrillation was well controlled on beta blockers and he was started on anticoagulation.  He reports that he can't he feels well, denies any significant lower extremity edema. No significant cough. His legs are mildly weak but this is slowly improving.  Echocardiogram done in the hospital shows normal LV systolic function, normal left atrial size, normal right ventricular function, mildly elevated right ventricular systolic pressures estimated at 35 mm mercury.  EKG today shows atrial fibrillation with ventricular rate 69 beats per minute with no significant ST or T wave changes     Review of Systems  HENT: Negative.   Eyes: Negative.   Respiratory: Positive for shortness of breath.   Cardiovascular: Negative.   Gastrointestinal: Negative.   Musculoskeletal: Positive for gait problem.  Skin: Negative.   Neurological: Positive for weakness.  Hematological: Negative.   Psychiatric/Behavioral: Negative.   All other systems reviewed and are negative.    BP 134/56  Pulse 66  Wt 226 lb (102.513 kg)   Physical Exam  Nursing note and vitals reviewed. Constitutional: He is oriented to person, place, and time. He appears well-developed and well-nourished.  HENT:  Head: Normocephalic.  Nose: Nose normal.    Mouth/Throat: Oropharynx is clear and moist.  Eyes: Conjunctivae are normal. Pupils are equal, round, and reactive to light.  Neck: Normal range of motion. Neck supple. No JVD present.  Cardiovascular: Normal rate, S1 normal, S2 normal, normal heart sounds and intact distal pulses.  An irregularly irregular rhythm present. Exam reveals no gallop and no friction rub.   No murmur heard. Pulmonary/Chest: Effort normal and breath sounds normal. No respiratory distress. He has no wheezes. He has no rales. He exhibits no tenderness.  Abdominal: Soft. Bowel sounds are normal. He exhibits no distension. There is no tenderness.  Musculoskeletal: Normal range of motion. He exhibits no edema and no tenderness.  Lymphadenopathy:    He has no cervical adenopathy.  Neurological: He is alert and oriented to person, place, and time. Coordination normal.  Skin: Skin is warm and dry. No rash noted. No erythema.  Psychiatric: He has a normal mood and affect. His behavior is normal. Judgment and thought content normal.           Assessment and Plan

## 2010-06-23 NOTE — Assessment & Plan Note (Signed)
Blood pressure is well controlled on today's visit. No changes made to the medications. 

## 2010-06-24 ENCOUNTER — Ambulatory Visit: Payer: Self-pay | Admitting: Family Medicine

## 2010-07-06 ENCOUNTER — Other Ambulatory Visit: Payer: Self-pay | Admitting: Cardiology

## 2010-07-10 ENCOUNTER — Encounter: Payer: Self-pay | Admitting: Cardiology

## 2010-07-13 ENCOUNTER — Telehealth: Payer: Self-pay

## 2010-07-13 MED ORDER — DABIGATRAN ETEXILATE MESYLATE 150 MG PO CAPS: 150.0000 mg | ORAL_CAPSULE | Freq: Two times a day (BID) | ORAL | Status: DC

## 2010-07-13 NOTE — Telephone Encounter (Signed)
Patient was given Pradaxa 150 mg take one tablet twice a day while in hospital and needs a Rx sent to [facility].

## 2010-07-28 ENCOUNTER — Telehealth: Payer: Self-pay | Admitting: *Deleted

## 2010-07-28 NOTE — Telephone Encounter (Signed)
Returning pt's call regarding scheduling a DCCV. Pt seen in office 07/10/10, it was discussed that we could schedule cardioversion when pt called office. Pt is taking Pradaxa, will confirm he has been on atleast 4 weeks. Do you want pt to have EKG in office prior to scheduling to confirm a fib? Please advise.

## 2010-07-28 NOTE — Telephone Encounter (Signed)
Called pt back and spoke with him, I just had a msg to call pt regarding procedure from [place]. I thought pt had called to schedule. He actually has not decided anything at this time, and does not want to schedule at this time. He is still taking Pradaxa 150 bid, and states he will call back when he decides what he would like to do.

## 2010-07-30 ENCOUNTER — Encounter: Payer: Self-pay | Admitting: Family Medicine

## 2010-07-30 ENCOUNTER — Ambulatory Visit (INDEPENDENT_AMBULATORY_CARE_PROVIDER_SITE_OTHER): Payer: PRIVATE HEALTH INSURANCE | Admitting: Family Medicine

## 2010-07-30 DIAGNOSIS — I10 Essential (primary) hypertension: Secondary | ICD-10-CM

## 2010-07-30 DIAGNOSIS — D485 Neoplasm of uncertain behavior of skin: Secondary | ICD-10-CM

## 2010-07-30 DIAGNOSIS — J449 Chronic obstructive pulmonary disease, unspecified: Secondary | ICD-10-CM | POA: Insufficient documentation

## 2010-07-30 DIAGNOSIS — E785 Hyperlipidemia, unspecified: Secondary | ICD-10-CM

## 2010-07-30 DIAGNOSIS — I4891 Unspecified atrial fibrillation: Secondary | ICD-10-CM

## 2010-07-30 NOTE — Assessment & Plan Note (Signed)
Good control. Cont curr meds. BP Readings from Last 3 Encounters:  07/30/10 132/60  06/23/10 134/56  01/21/10 146/60

## 2010-07-30 NOTE — Patient Instructions (Signed)
Pls schedule fasting bloodtest when able.

## 2010-07-30 NOTE — Assessment & Plan Note (Signed)
Discussed pathophysiology and reasons for pursuing cardioversion.

## 2010-07-30 NOTE — Assessment & Plan Note (Signed)
Suggest seeing his dermatologist for the lesion behind the left ear as it could be SCC.

## 2010-07-30 NOTE — Progress Notes (Signed)
  Subjective:    Patient ID: [name], male    DOB: [birth year], 77 y.o.   MRN: [id number]  HPI Pt here with his daughter for followup from his hospitalization for Pneumonia and eventual diagnosis of Emphysema and also new onset Afib. He felt like a million bucks when he left the hospital but has had aches and pains and mild dyspnea return. He was on steroids in the hosptial and on a steroid taper on discharge. He has slight numbness in the anterior proximal left shin where he used to have pain down his left leg. He is not interested in pursuing this further at this time after explanation of radiculopathy. His breathing is stable and he continues to see Dr Meredeth Ide at [facility] for followup, now on multiple inhalers.  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 It has been scabbing and rescabbing for a few weeks.  I also changed his Simvastatin in Dec as he was on Amlodipine 10 and Simva 80, so decreased him to 40mg  and to out knowledge has not been rechecked. We also discussed Afib and cardioversion and reasons for pursuing NSR. In his case with pulmonary compromise, it might be more important than usual.   Review of Systems Noncontributory except as above.       Objective:   Physical Exam  Constitutional: He appears well-developed and well-nourished. No distress.  HENT:  Head: Normocephalic and atraumatic.  Right Ear: External ear normal.  Left Ear: External ear normal.  Nose: Nose normal.  Mouth/Throat: Oropharynx is clear and moist.  Eyes: Conjunctivae and EOM are normal. Pupils are equal, round, and reactive to light. Right eye exhibits no discharge. Left eye exhibits no discharge.  Neck: Normal range of motion. Neck supple.  Cardiovascular: Normal rate and regular rhythm.   Pulmonary/Chest: Effort normal and breath sounds normal. He has no wheezes.  Lymphadenopathy:    He has no cervical adenopathy.  Skin: He is not diaphoretic.           Assessment & Plan:

## 2010-07-30 NOTE — Assessment & Plan Note (Addendum)
Will recheck Chol on lower Simvastatin dose. Ask his insurance company about Crestor or Lipitor coverage. Lab Results  Component Value Date   CHOL 96 12/16/2009   CHOL 84 12/10/2008   CHOL 92 07/02/2008   Lab Results  Component Value Date   HDL 29.10* 12/16/2009   HDL 28.30* 12/10/2008   HDL 35* 07/02/2008   Lab Results  Component Value Date   LDLCALC 49 12/16/2009   LDLCALC 42 12/10/2008   LDLCALC 44 07/02/2008   Lab Results  Component Value Date   TRIG 90.0 12/16/2009   TRIG 70.0 12/10/2008   TRIG 67 07/02/2008   Lab Results  Component Value Date   CHOLHDL 3 12/16/2009   CHOLHDL 3 12/10/2008   CHOLHDL 2.6 Ratio 07/02/2008   No results found for this basename: LDLDIRECT

## 2010-07-31 ENCOUNTER — Other Ambulatory Visit (INDEPENDENT_AMBULATORY_CARE_PROVIDER_SITE_OTHER): Payer: PRIVATE HEALTH INSURANCE | Admitting: Family Medicine

## 2010-07-31 DIAGNOSIS — E785 Hyperlipidemia, unspecified: Secondary | ICD-10-CM

## 2010-07-31 LAB — LIPID PANEL
LDL Cholesterol: 34 mg/dL (ref 0–99)
VLDL: 13.8 mg/dL (ref 0.0–40.0)

## 2010-07-31 LAB — HEPATIC FUNCTION PANEL
Bilirubin, Direct: 0.2 mg/dL (ref 0.0–0.3)
Total Bilirubin: 0.7 mg/dL (ref 0.3–1.2)

## 2010-08-03 ENCOUNTER — Other Ambulatory Visit: Payer: Self-pay | Admitting: *Deleted

## 2010-08-03 MED ORDER — SIMVASTATIN 20 MG PO TABS: 20.0000 mg | ORAL_TABLET | Freq: Every day | ORAL | Status: DC

## 2010-08-03 NOTE — Telephone Encounter (Signed)
Message copied by Binnie Kand on Mon Aug 03, 2010  9:07 AM ------      Message from: Arta Silence      Created: Fri Jul 31, 2010  4:45 PM       Please let pt know that his chol is so good that I would decrease his medication even further. As I understand it, he is currently on 40mg , as we decreased his dose due to being on Amlodipine also. His LDL is so good that I want him to decrease to 20mg  of Simvastatin. He will then need to recheck his chol in 3 months. That lab can be ordered then. HE needs a new prescription.

## 2010-08-03 NOTE — Telephone Encounter (Signed)
Message copied by Binnie Kand on Mon Aug 03, 2010  9:04 AM ------      Message from: Arta Silence      Created: Fri Jul 31, 2010  4:45 PM       Please let pt know that his chol is so good that I would decrease his medication even further. As I understand it, he is currently on 40mg , as we decreased his dose due to being on Amlodipine also. His LDL is so good that I want him to decrease to 20mg  of Simvastatin. He will then need to recheck his chol in 3 months. That lab can be ordered then. HE needs a new prescription.

## 2010-08-03 NOTE — Telephone Encounter (Signed)
New rx sent to pharmacy as instructed for Simvastatin.

## 2010-08-04 ENCOUNTER — Encounter: Payer: Self-pay | Admitting: Cardiovascular Disease

## 2010-10-05 ENCOUNTER — Telehealth: Payer: Self-pay

## 2010-10-05 MED ORDER — DABIGATRAN ETEXILATE MESYLATE 150 MG PO CAPS: 150.0000 mg | ORAL_CAPSULE | Freq: Two times a day (BID) | ORAL | Status: DC

## 2010-10-05 NOTE — Telephone Encounter (Signed)
Patient calling with medication problem.  He thought to take an extra pradaxa with increased fluid so he has ran out of pradaxa and need someone to call into the pharmacy for a refill.    I told the patient he does not need to ever increase the pradaxa for increased fluid that pradaxa is a bid dose only and is not for increased fluid it is a blood thinner.  The patient understands to call the office for any question regarding his medications.

## 2010-10-30 ENCOUNTER — Other Ambulatory Visit: Payer: Self-pay | Admitting: Family Medicine

## 2010-10-30 DIAGNOSIS — E785 Hyperlipidemia, unspecified: Secondary | ICD-10-CM

## 2010-11-03 ENCOUNTER — Other Ambulatory Visit (INDEPENDENT_AMBULATORY_CARE_PROVIDER_SITE_OTHER): Payer: PRIVATE HEALTH INSURANCE

## 2010-11-03 DIAGNOSIS — E785 Hyperlipidemia, unspecified: Secondary | ICD-10-CM

## 2010-11-03 LAB — LIPID PANEL
Cholesterol: 122 mg/dL (ref 0–200)
HDL: 52.5 mg/dL (ref >39.00)
LDL Cholesterol: 57 mg/dL (ref 0–99)
Total CHOL/HDL Ratio: 2
Triglycerides: 64 mg/dL (ref 0.0–149.0)

## 2010-11-17 ENCOUNTER — Other Ambulatory Visit: Payer: Self-pay | Admitting: Family Medicine

## 2010-11-25 ENCOUNTER — Other Ambulatory Visit: Payer: Self-pay | Admitting: Family Medicine

## 2010-11-25 NOTE — Telephone Encounter (Signed)
Medication refilled electronically 

## 2010-12-02 ENCOUNTER — Other Ambulatory Visit: Payer: Self-pay | Admitting: Family Medicine

## 2010-12-21 ENCOUNTER — Ambulatory Visit (INDEPENDENT_AMBULATORY_CARE_PROVIDER_SITE_OTHER): Payer: PRIVATE HEALTH INSURANCE | Admitting: Cardiovascular Disease

## 2010-12-21 ENCOUNTER — Encounter: Payer: Self-pay | Admitting: Cardiovascular Disease

## 2010-12-21 VITALS — BP 144/60 | HR 73 | Ht 68.0 in | Wt 217.5 lb

## 2010-12-21 DIAGNOSIS — J449 Chronic obstructive pulmonary disease, unspecified: Secondary | ICD-10-CM

## 2010-12-21 DIAGNOSIS — E785 Hyperlipidemia, unspecified: Secondary | ICD-10-CM

## 2010-12-21 DIAGNOSIS — I10 Essential (primary) hypertension: Secondary | ICD-10-CM

## 2010-12-21 DIAGNOSIS — I4891 Unspecified atrial fibrillation: Secondary | ICD-10-CM

## 2010-12-21 NOTE — Assessment & Plan Note (Signed)
Blood pressure is well controlled on today's visit. No changes made to the medications. 

## 2010-12-21 NOTE — Assessment & Plan Note (Signed)
Cholesterol is at goal on the current lipid regimen. No changes to the medications were made.  

## 2010-12-21 NOTE — Patient Instructions (Signed)
You are doing well. No medication changes were made.  Please call us if you have new issues that need to be addressed before your next appt.  The office will contact you for a follow up Appt. In 6 months  

## 2010-12-21 NOTE — Assessment & Plan Note (Addendum)
Rate is relatively well controlled on his current medication regimen. He prefers to stay on pradaxa despite the high cost for him. No interest in cardioversion at this time.

## 2010-12-21 NOTE — Progress Notes (Signed)
Patient ID: [name], male    DOB: 09/19/1933, 77 y.o.   MRN: [id number]  HPI Comments: Mr. [name] is a very pleasant 57 your gentleman with a long history of smoking, suspected COPD, no known coronary artery disease, who was  brought in by EMS to the hospital in mid May 2012 with respiratory distress, started on BiPAP with chest x-ray showing right lower lobe pneumonia, new atrial fibrillation. He was offered cardioversion on his last office visit though declined and preferred medical management.  He reports that he has done well and has no symptoms. He has been active. He declines cardioversion again and prefers to stay on pradaxa. He is not particularly interested in warfarin. His family reports that his gait is somewhat unstable and he does not use a cane.  Echocardiogram done in the hospital shows normal LV systolic function, normal left atrial size, normal right ventricular function, mildly elevated right ventricular systolic pressures estimated at 35 mm mercury.  EKG today shows atrial fibrillation with ventricular rate 73 beats per minute with no significant ST or T wave changes   Outpatient Encounter Prescriptions as of 12/21/2010  Medication Sig Dispense Refill  . acetaminophen (TYLENOL) 325 MG tablet Take by mouth as needed       . amLODipine (NORVASC) 10 MG tablet take 1 tablet by mouth every evening  30 tablet  2  . aspirin 81 MG EC tablet Take 81 mg by mouth [name].        Marland Kitchen augmented betamethasone dipropionate (DIPROLENE-AF) 0.05 % ointment Apply as needed       . carvedilol (COREG) 25 MG tablet Take 25 mg by mouth 2 (two) times [name] with a meal.        . Cyanocobalamin (VITAMIN B-12) 2500 MCG SUBL Take one [name]       . dabigatran (PRADAXA) 150 MG CAPS Take 1 capsule (150 mg total) by mouth every 12 (twelve) hours.  60 capsule  3  . ferrous sulfate (FERRO-BOB) 325 (65 FE) MG tablet Take 325 mg by mouth every other day.       . furosemide (LASIX) 20 MG tablet TAKE 1 TABLET BY  MOUTH ONCE [name]  30 tablet  6  . losartan (COZAAR) 100 MG tablet Take 100 mg by mouth [name].        . metFORMIN (GLUCOPHAGE) 500 MG tablet TAKE 1 TABLET BY MOUTH AT BEDTIME  90 tablet  1  . ONE TOUCH LANCETS MISC One [name] and as needed/ ICD code 250.00       . ONE TOUCH ULTRA TEST test strip TEST BLOOD SUGAR ONCE [name] AS DIRECTED  100 each  0  . potassium gluconate 595 MG TABS Take 595 mg by mouth [name].        . predniSONE (DELTASONE) 10 MG tablet Take 10 mg by mouth [name].        Marland Kitchen Respiratory Therapy Supplies (NEBULIZER) DEVI by Does not apply route. Use as directed       . simvastatin (ZOCOR) 20 MG tablet take 1 tablet by mouth once [name]  30 tablet  5  . DISCONTD: Ipratropium-Albuterol (COMBIVENT IN) Inhale two puffs into lungs every 6 hours       . DISCONTD: Mometasone Furo-Formoterol Fum (DULERA) 200-5 MCG/ACT AERO Inhale two puffs every 12 hours          Review of Systems  HENT: Negative.   Eyes: Negative.   Cardiovascular: Negative.   Gastrointestinal: Negative.   Musculoskeletal:  Positive for gait problem.  Skin: Negative.   Hematological: Negative.   Psychiatric/Behavioral: Negative.   All other systems reviewed and are negative.    BP 144/60  Pulse 73  Ht 5\' 8"  (1.727 m)  Wt 217 lb 8 oz (98.657 kg)  BMI 33.07 kg/m2   Physical Exam  Nursing note and vitals reviewed. Constitutional: He is oriented to person, place, and time. He appears well-developed and well-nourished.  HENT:  Head: Normocephalic.  Nose: Nose normal.  Mouth/Throat: Oropharynx is clear and moist.  Eyes: Conjunctivae are normal. Pupils are equal, round, and reactive to light.  Neck: Normal range of motion. Neck supple. No JVD present.  Cardiovascular: Normal rate, S1 normal, S2 normal, normal heart sounds and intact distal pulses.  An irregularly irregular rhythm present. Exam reveals no gallop and no friction rub.   No murmur heard. Pulmonary/Chest: Effort normal and breath sounds normal. No  respiratory distress. He has no wheezes. He has no rales. He exhibits no tenderness.  Abdominal: Soft. Bowel sounds are normal. He exhibits no distension. There is no tenderness.  Musculoskeletal: Normal range of motion. He exhibits no edema and no tenderness.  Lymphadenopathy:    He has no cervical adenopathy.  Neurological: He is alert and oriented to person, place, and time. Coordination normal.  Skin: Skin is warm and dry. No rash noted. No erythema.  Psychiatric: He has a normal mood and affect. His behavior is normal. Judgment and thought content normal.           Assessment and Plan

## 2010-12-21 NOTE — Assessment & Plan Note (Signed)
He reports no significant shortness of breath. Doing well on his current medication regimen. Seen by Dr. Meredeth Ide.

## 2011-01-13 ENCOUNTER — Other Ambulatory Visit: Payer: Self-pay | Admitting: Family Medicine

## 2011-01-14 ENCOUNTER — Telehealth: Payer: Self-pay | Admitting: Internal Medicine

## 2011-01-14 NOTE — Telephone Encounter (Signed)
He needs to be seen, preferably tomorrow. He could have infection going on causing the sugar to be high. HE might need his medication adjusted. At this time of year, he is probably ALSO being noncompliant with sweets and carbs, which he needs to avoid.

## 2011-01-14 NOTE — Telephone Encounter (Signed)
Patient notified as instructed by telephone. Appointment scheduled with Dr. Sharen Hones tomorrow Friday January 14, 2011.

## 2011-01-14 NOTE — Telephone Encounter (Signed)
Patient stated his blood sugar has been running high for a little over a week, it's been between 257-525.  He states he hasn't done anything different and wants to know what to do.  He just can't get his BS down.

## 2011-01-15 ENCOUNTER — Encounter: Payer: Self-pay | Admitting: Family Medicine

## 2011-01-15 ENCOUNTER — Ambulatory Visit (INDEPENDENT_AMBULATORY_CARE_PROVIDER_SITE_OTHER): Payer: PRIVATE HEALTH INSURANCE | Admitting: Family Medicine

## 2011-01-15 VITALS — BP 132/64 | HR 84 | Temp 98.4°F | Wt 213.2 lb

## 2011-01-15 DIAGNOSIS — R7309 Other abnormal glucose: Secondary | ICD-10-CM

## 2011-01-15 DIAGNOSIS — R739 Hyperglycemia, unspecified: Secondary | ICD-10-CM | POA: Insufficient documentation

## 2011-01-15 LAB — BASIC METABOLIC PANEL
BUN: 22 mg/dL (ref 6–23)
Calcium: 9.8 mg/dL (ref 8.4–10.5)
Creat: 1.2 mg/dL (ref 0.50–1.35)

## 2011-01-15 MED ORDER — GLIMEPIRIDE 2 MG PO TABS: 2.0000 mg | ORAL_TABLET | Freq: Every day | ORAL | Status: DC

## 2011-01-15 MED ORDER — METFORMIN HCL 500 MG PO TABS: 500.0000 mg | ORAL_TABLET | Freq: Two times a day (BID) | ORAL | Status: DC

## 2011-01-15 NOTE — Progress Notes (Signed)
  Subjective:    Patient ID: [name], male    DOB: 30-Nov-1933, 77 y.o.   MRN: [id number]  HPI CC: hyperglycemia  77 yo new to me with complicated history including bladder cancer with s/p ileal conduit, cryptogenic cirrhosis, T2DM previously controlled on metformin 500mg  qhs, HTN, HLD and COPD on chronic prednisone but off inhalers per pt, and dCHF presents with concern for elevated sugars.  Only on metformin 500mg  qhs.  started checking sugars 2 1/2 wks ago, high in 500s.  Started checking regularly since then, am sugars 230-240s, this morning 284.  Evening sugars >300s.  Denies lows.  Notes staying thirsty.  No fevers/chills, coughing, abd pain, chest pain, dizziness, dysuria, skin infections or other infections.    Noted spot on back of ear that has not been healing.  Recommended by PCP see derm.  On prednisone for several months 10 mg daily by pulm.  Unclear as to plan for steroid use.  No recent note in chart  Lab Results  Component Value Date   CREATININE 1.0 12/16/2009   Lab Results  Component Value Date   HGBA1C 6.2 12/16/2009   Review of Systems Per HPI    Objective:   Physical Exam  Nursing note and vitals reviewed. Constitutional: He appears well-developed and well-nourished. No distress.  HENT:  Head: Normocephalic and atraumatic.  Mouth/Throat: Oropharynx is clear and moist. No oropharyngeal exudate.  Eyes: Conjunctivae and EOM are normal. Pupils are equal, round, and reactive to light. No scleral icterus.  Neck: Normal range of motion. Neck supple.  Cardiovascular: Normal rate, regular rhythm, normal heart sounds and intact distal pulses.   No murmur heard. Pulmonary/Chest: Effort normal and breath sounds normal. No respiratory distress. He has no wheezes. He has no rales.  Abdominal: Soft. Bowel sounds are normal. He exhibits no distension. There is no tenderness. There is no rebound and no guarding.  Musculoskeletal: He exhibits no edema.       Diabetic foot  exam: Normal inspection No skin breakdown No calluses  Normal DP/PT pulses Normal sensation to light touch but decreased to monofilament right foot Nails normal  Skin: Skin is warm and dry. No rash noted.  Psychiatric: He has a normal mood and affect.       Assessment & Plan:

## 2011-01-15 NOTE — Patient Instructions (Signed)
Your sugars are too high!  call Dr. Meredeth Ide about whether you still need to take prednisone 10mg .  I will request records of last office visit with him. Increase metformin to 500mg  twice daily Start amaryl every morning. Return to see me in 2-3 weeks for follow up.  Call me sooner (10 days) if sugars not coming down or if worsening. Ensure getting plenty of water.

## 2011-01-16 LAB — HEMOGLOBIN A1C
Hgb A1c MFr Bld: 9.6 % — ABNORMAL HIGH (ref <5.7)
Mean Plasma Glucose: 229 mg/dL — ABNORMAL HIGH (ref <117)

## 2011-01-16 NOTE — Assessment & Plan Note (Signed)
Newly worsened glycemic control, no evidence of infection. Will increase metformin to 500mg  bid, then start amaryl 2mg  daily. Call me with update in 10 days, return to clinic in 2-3 wks. Asked pt to call pulm re: steroid plan (longterm or taper?). Check blood work today as has been >1 yr since last checked

## 2011-01-22 ENCOUNTER — Other Ambulatory Visit: Payer: Self-pay | Admitting: Family Medicine

## 2011-02-05 ENCOUNTER — Ambulatory Visit (INDEPENDENT_AMBULATORY_CARE_PROVIDER_SITE_OTHER): Payer: PRIVATE HEALTH INSURANCE | Admitting: Family Medicine

## 2011-02-05 ENCOUNTER — Encounter: Payer: Self-pay | Admitting: Family Medicine

## 2011-02-05 VITALS — BP 136/82 | HR 68 | Temp 98.4°F | Wt 220.2 lb

## 2011-02-05 DIAGNOSIS — I10 Essential (primary) hypertension: Secondary | ICD-10-CM

## 2011-02-05 DIAGNOSIS — Z23 Encounter for immunization: Secondary | ICD-10-CM

## 2011-02-05 DIAGNOSIS — E119 Type 2 diabetes mellitus without complications: Secondary | ICD-10-CM

## 2011-02-05 DIAGNOSIS — J449 Chronic obstructive pulmonary disease, unspecified: Secondary | ICD-10-CM

## 2011-02-05 DIAGNOSIS — R0989 Other specified symptoms and signs involving the circulatory and respiratory systems: Secondary | ICD-10-CM

## 2011-02-05 MED ORDER — IPRATROPIUM-ALBUTEROL 18-103 MCG/ACT IN AERO: 2.0000 | INHALATION_SPRAY | RESPIRATORY_TRACT | Status: DC | PRN

## 2011-02-05 NOTE — Assessment & Plan Note (Signed)
Agree with self taper off steroids as doubt Dr. Meredeth Ide wanted him on chronic steroids. Anticipate this has also helped sugar control. rec restart combivent regularly for next several days.

## 2011-02-05 NOTE — Assessment & Plan Note (Signed)
Anticipate viral etiology leading to mild flare of COPD, however, doesn't meet criteria for COPD exac. Treat with restarting combivent (wasn't taking) scheduled for next few days. Red flags to return discussed (worsening instead of improving, fever >101, etc). Also temporary increase in lasix for crackles heard on exam

## 2011-02-05 NOTE — Patient Instructions (Signed)
Flu shot today. I hear some fluid and wheezing in lungs - take combivent 2 puffs every 4-6 hours for next several days. Increase lasix to one pill twice daily for next 5 days. If worsening breathing or fever >101, please return.  If things not getting better let me know. I think sugars are doing much better. Return to see me in 2-3 months, sooner if needed.

## 2011-02-05 NOTE — Progress Notes (Signed)
  Subjective:    Patient ID: [name], male    DOB: 05-01-1933, 78 y.o.   MRN: [id number]  HPI CC: 3wk f/u  Complicated 78 yo who presented new to me last month with hyperglycemia, increased metformin to 500mg  bid and started amaryl 2mg  qam.  bings log of sugars - fasting 80 to 170s.  Much improved but still some highs.  no low sugars.  Has not called Dr. Meredeth Ide.  stopped prednisone on his own.  Has appt with Meredeth Ide and will check then.  Also today presents with chest congestion.  sxs going on for a few weeks.  Started with chills, now improving.  Now light congestion in chest.  No recent fevers/chills.  Seems to be improving on it's own.  BP Readings from Last 3 Encounters:  02/05/11 136/82  01/15/11 132/64  12/21/10 144/60   Lab Results  Component Value Date   CREATININE 1.20 01/15/2011   Lab Results  Component Value Date   HGBA1C 9.6* 01/15/2011   Review of Systems Per HPI    Objective:   Physical Exam  Vitals reviewed. Constitutional: He appears well-developed and well-nourished. No distress.  HENT:  Head: Normocephalic and atraumatic.  Mouth/Throat: Oropharynx is clear and moist. No oropharyngeal exudate.  Eyes: Conjunctivae and EOM are normal. Pupils are equal, round, and reactive to light. No scleral icterus.  Neck: Normal range of motion. Neck supple. Carotid bruit is not present.  Cardiovascular: Normal rate, regular rhythm, normal heart sounds and intact distal pulses.   No murmur heard. Pulmonary/Chest: Effort normal. No respiratory distress. He has wheezes (mild exp wheezing). He has no rales.       Crackles bibasilarly  Musculoskeletal: He exhibits no edema.  Skin: Skin is warm and dry. No rash noted.  Psychiatric: He has a normal mood and affect.       Assessment & Plan:

## 2011-02-05 NOTE — Assessment & Plan Note (Signed)
Not optimal. ?mild fluid overload Increase lasix to bid for 5 days.

## 2011-02-05 NOTE — Assessment & Plan Note (Signed)
Chronic. Improved control with starting amaryl 2mg  and increasing metformin to 500mg  bid as evidenced by recent log he brings of fasting sugars. Continue as is.

## 2011-02-07 ENCOUNTER — Other Ambulatory Visit: Payer: Self-pay | Admitting: Cardiology

## 2011-02-13 ENCOUNTER — Other Ambulatory Visit: Payer: Self-pay | Admitting: Family Medicine

## 2011-02-27 ENCOUNTER — Other Ambulatory Visit: Payer: Self-pay | Admitting: Family Medicine

## 2011-03-03 ENCOUNTER — Other Ambulatory Visit: Payer: Self-pay | Admitting: Family Medicine

## 2011-03-22 ENCOUNTER — Encounter: Payer: Self-pay | Admitting: Family Medicine

## 2011-03-22 ENCOUNTER — Inpatient Hospital Stay: Payer: Self-pay | Admitting: Internal Medicine

## 2011-03-22 ENCOUNTER — Ambulatory Visit (INDEPENDENT_AMBULATORY_CARE_PROVIDER_SITE_OTHER): Payer: PRIVATE HEALTH INSURANCE | Admitting: Family Medicine

## 2011-03-22 DIAGNOSIS — R0600 Dyspnea, unspecified: Secondary | ICD-10-CM

## 2011-03-22 DIAGNOSIS — R0989 Other specified symptoms and signs involving the circulatory and respiratory systems: Secondary | ICD-10-CM

## 2011-03-22 LAB — CBC
HGB: 11 g/dL — ABNORMAL LOW (ref 13.0–18.0)
MCH: 30.5 pg (ref 26.0–34.0)
Platelet: 210 10*3/uL (ref 150–440)
RBC: 3.6 10*6/uL — ABNORMAL LOW (ref 4.40–5.90)
WBC: 8 10*3/uL (ref 3.8–10.6)

## 2011-03-22 LAB — URINALYSIS, COMPLETE
Bacteria: NONE SEEN
Bilirubin,UR: NEGATIVE
Blood: NEGATIVE
Glucose,UR: NEGATIVE mg/dL (ref 0–75)
Hyaline Cast: 3
Ketone: NEGATIVE
Ph: 6 (ref 4.5–8.0)
Protein: NEGATIVE
RBC,UR: 1 /HPF (ref 0–5)
Specific Gravity: 1.004 (ref 1.003–1.030)
Squamous Epithelial: NONE SEEN

## 2011-03-22 LAB — BASIC METABOLIC PANEL
Anion Gap: 12 (ref 7–16)
BUN: 14 mg/dL (ref 7–18)
Calcium, Total: 8.8 mg/dL (ref 8.5–10.1)
Chloride: 108 mmol/L — ABNORMAL HIGH (ref 98–107)
Co2: 26 mmol/L (ref 21–32)
Creatinine: 0.76 mg/dL (ref 0.60–1.30)
EGFR (African American): >60
EGFR (Non-African Amer.): >60
Osmolality: 291 (ref 275–301)
Potassium: 4.2 mmol/L (ref 3.5–5.1)

## 2011-03-22 LAB — HEPATIC FUNCTION PANEL A (ARMC)
Albumin: 3.8 g/dL (ref 3.4–5.0)
Alkaline Phosphatase: 102 U/L (ref 50–136)
Bilirubin, Direct: 0.2 mg/dL (ref 0.00–0.20)
SGOT(AST): 44 U/L — ABNORMAL HIGH (ref 15–37)
SGPT (ALT): 23 U/L

## 2011-03-22 NOTE — Assessment & Plan Note (Addendum)
Complicated 78 yo with 3wk h/o progressively worsening dyspnea, now hypoxia even on 4L [place]. Anticipate majority of problem is fluid overload from acute CHF exac, has failed self outpt diuresis. Given hypoxia on exam, recommend ER evaluation and likely admission for IV diuresis. Will need to r/o PNA or COPD exac as contributing. I don't know how much cirrhosis playing effect here. Pt has own O2 tank, will go straight to ER with daughter. Called charge nurse and notified we are sending pt. Last month 97% on RA, today barely maintaining 90% on 4L [place].

## 2011-03-22 NOTE — Progress Notes (Signed)
Subjective:    Patient ID: [name], male    DOB: 1933-05-01, 78 y.o.   MRN: [id number]  HPI CC: trouble breathing  78 yo with complicated history including bladder cancer s/p ileal conduit, cryptogenic cirrhosis, uncontrolled T2DM, HTN, HLD and COPD and dCHF.  3 wk h/o worsening productive cough, SOB having to use 4L [place] pretty constantly (which is a change - prior wasn't using O2), + chills.  + orthopnea, sleeping in recliner.  ++ leg swelling.   At home when awakens O2 sat as low as 79%.  Taking lasix 20mg  daily.  Has tried to double up on lasix on 2 separate occasions over last few weeks, did not help.  Has tried mucinex which did help some.  DM -fasting 90-130. Better control recently.  Has not seen Dr. Meredeth Ide or other specialists for months.  Last saw Dr. Mariah Milling 11/2010.  Wt Readings from Last 3 Encounters:  03/22/11 233 lb 4 oz (105.802 kg)  02/05/11 220 lb 4 oz (99.905 kg)  01/15/11 213 lb 4 oz (96.73 kg)   Cards: gollan [facility] GI: [facility] Pulm: Dr. Meredeth Ide Uro: [facility]  Medications and allergies reviewed and updated in chart.  Past histories reviewed and updated if relevant as below. Patient Active Problem List  Diagnoses  . DIABETES MELLITUS, TYPE II  . HYPERLIPIDEMIA  . ANEMIA, IRON DEFICIENCY  . ANEMIA-B12 DEFICIENCY  . HYPERTENSION  . AORTIC INSUFFICIENCY  . HEMORRHOIDS, INTERNAL  . INGUINAL HERNIA, LEFT  . CIRRHOSIS  . RENAL INSUFFICIENCY  . PYLORIC STENOSIS  . DYSPNEA ON EXERTION  . Atrial fibrillation  . Neoplasm of uncertain behavior of skin  . COPD (chronic obstructive pulmonary disease)  . Hyperglycemia  . Chest congestion   Past Medical History  Diagnosis Date  . Diabetes mellitus, type 2   . Hyperlipidemia   . Hypertension   . Cryptogenic cirrhosis     Followed by Dr Russella Dar, never biopsied, has been stable.  History of gastric varices.  . Pyloric stenosis     mild EGD 10/26/06  . Inguinal hernia     left  . Iron deficiency  anemia   . CHF (congestive heart failure)     diastolic. Echo 5/10 with mild LVH, EF 75%, grade 1 diastolic dysfunction, severe left atrial enlargement, aortic sclerosis  . Bladder cancer 1984    s/p ileal conduit  . Hemorrhoids   . B12 deficiency   . COPD (chronic obstructive pulmonary disease)     per pt (Dr. Meredeth Ide)   Past Surgical History  Procedure Date  . Ileostomy 1984    bladder cancer  . Prostatectomy 1984    bladder cancer  . Penile prosthesis implant 1990    inflatable implant  . US echocardiography 05/1995    EF 60% TR, AI  . Colonoscopy 07/24/2003    Int. hemorrhoids  . Be 08/08/2003  . Colonoscopy 10/26/2006    Int hemorrhoids, 10 yrs  . Esophagogastroduodenoscopy 10/26/2006    Gastric varices, pyloric stenosis   History  Substance Use Topics  . Smoking status: Former Smoker -- 1.0 packs/day for 45 years    Types: Cigarettes    Quit date: 06/22/1989  . Smokeless tobacco: Never Used   Comment: Quit 1990  . Alcohol Use: 0.5 oz/week    1 drink(s) per week     beer/ occassionally   Family History  Problem Relation Age of Onset  . Hypertension Mother   . Osteoarthritis Mother   . Heart disease  Father     MI  . Coronary artery disease Sister   . Diabetes Brother   . Heart disease Brother     MI  . Coronary artery disease Sister   . Colon cancer Neg Hx   . Stroke Other     GPs   No Known Allergies Current Outpatient Prescriptions on File Prior to Visit  Medication Sig Dispense Refill  . acetaminophen (TYLENOL) 325 MG tablet Take by mouth as needed       . albuterol-ipratropium (COMBIVENT) 18-103 MCG/ACT inhaler Inhale 2 puffs into the lungs every 4 (four) hours as needed for wheezing or shortness of breath.  14.7 g  6  . amLODipine (NORVASC) 10 MG tablet take 1 tablet by mouth every evening  30 tablet  3  . aspirin 81 MG EC tablet Take 81 mg by mouth daily.        Marland Kitchen augmented betamethasone dipropionate (DIPROLENE-AF) 0.05 % ointment Apply as needed        . carvedilol (COREG) 25 MG tablet take 1 tablet by mouth twice a day  60 tablet  12  . Cyanocobalamin (VITAMIN B-12) 2500 MCG SUBL Take one daily       . dabigatran (PRADAXA) 150 MG CAPS Take 1 capsule (150 mg total) by mouth every 12 (twelve) hours.  60 capsule  3  . ferrous sulfate (FERRO-BOB) 325 (65 FE) MG tablet Take 325 mg by mouth every other day.       . furosemide (LASIX) 20 MG tablet TAKE 1 TABLET BY MOUTH ONCE DAILY  30 tablet  6  . glimepiride (AMARYL) 2 MG tablet Take 1 tablet (2 mg total) by mouth daily before breakfast.  30 tablet  3  . losartan (COZAAR) 100 MG tablet take 1 tablet by mouth once daily  90 tablet  3  . metFORMIN (GLUCOPHAGE) 500 MG tablet Take 1 tablet (500 mg total) by mouth 2 (two) times daily with a meal.  180 tablet  3  . ONE TOUCH LANCETS MISC One daily and as needed/ ICD code 250.00       . ONE TOUCH ULTRA TEST test strip TEST BLOOD SUGAR ONCE DAILY AS DIRECTED  100 each  11  . potassium gluconate 595 MG TABS Take 595 mg by mouth daily.        Marland Kitchen Respiratory Therapy Supplies (NEBULIZER) DEVI by Does not apply route. Use as directed       . simvastatin (ZOCOR) 20 MG tablet take 1 tablet by mouth once daily  30 tablet  5   Review of Systems Per HPI    Objective:   Physical Exam  Nursing note and vitals reviewed. Constitutional: He appears well-developed and well-nourished. No distress.  HENT:  Head: Normocephalic and atraumatic.  Mouth/Throat: Oropharynx is clear and moist. No oropharyngeal exudate.  Eyes: Conjunctivae and EOM are normal. Pupils are equal, round, and reactive to light. No scleral icterus.  Neck: Normal range of motion. Neck supple. JVD present.  Cardiovascular: Normal rate, regular rhythm, normal heart sounds and intact distal pulses.   No murmur heard. Pulmonary/Chest: Effort normal. No respiratory distress. He has wheezes. He has no rales.       decreased breath sounds bilaterally. Crackles bibasilarly to mid scapula. Faint  exp wheezing.  Abdominal: Soft. He exhibits distension. He exhibits no mass. Bowel sounds are increased. There is no tenderness. There is no rebound and no guarding.  Musculoskeletal: He exhibits edema (1-2+ pitting bilaterally).  Lymphadenopathy:  He has no cervical adenopathy.  Skin: Skin is warm and dry. No rash noted.  Psychiatric: He has a normal mood and affect.       Assessment & Plan:

## 2011-03-22 NOTE — Patient Instructions (Signed)
To [facility] ER today. I'm sorry you're feeling poorly.

## 2011-03-23 LAB — CBC WITH DIFFERENTIAL/PLATELET
Basophil %: 0.3 %
Eosinophil #: 0 10*3/uL (ref 0.0–0.7)
HCT: 31 % — ABNORMAL LOW (ref 40.0–52.0)
HGB: 10.4 g/dL — ABNORMAL LOW (ref 13.0–18.0)
MCH: 30.3 pg (ref 26.0–34.0)
MCHC: 33.5 g/dL (ref 32.0–36.0)
MCV: 90 fL (ref 80–100)
Monocyte #: 0.1 10*3/uL (ref 0.0–0.7)
Monocyte %: 2 %
Neutrophil #: 6.4 10*3/uL (ref 1.4–6.5)
RBC: 3.43 10*6/uL — ABNORMAL LOW (ref 4.40–5.90)
RDW: 14.6 % — ABNORMAL HIGH (ref 11.5–14.5)
WBC: 7.1 10*3/uL (ref 3.8–10.6)

## 2011-03-23 LAB — LIPID PANEL
Cholesterol: 90 mg/dL (ref 0–200)
HDL Cholesterol: 28 mg/dL — ABNORMAL LOW (ref 40–60)
Ldl Cholesterol, Calc: 50 mg/dL (ref 0–100)
Triglycerides: 62 mg/dL (ref 0–200)
VLDL Cholesterol, Calc: 12 mg/dL (ref 5–40)

## 2011-03-23 LAB — BASIC METABOLIC PANEL
Anion Gap: 12 (ref 7–16)
BUN: 15 mg/dL (ref 7–18)
Chloride: 104 mmol/L (ref 98–107)
Co2: 28 mmol/L (ref 21–32)
Creatinine: 0.88 mg/dL (ref 0.60–1.30)
EGFR (African American): >60
EGFR (Non-African Amer.): >60
Glucose: 166 mg/dL — ABNORMAL HIGH (ref 65–99)
Sodium: 144 mmol/L (ref 136–145)

## 2011-03-23 LAB — CK TOTAL AND CKMB (NOT AT ARMC)
CK, Total: 119 U/L (ref 35–232)
CK-MB: 1 ng/mL (ref 0.5–3.6)
CK-MB: 1.1 ng/mL (ref 0.5–3.6)

## 2011-03-23 LAB — TROPONIN I: Troponin-I: <0.02 ng/mL

## 2011-03-28 LAB — CULTURE, BLOOD (SINGLE)

## 2011-03-30 ENCOUNTER — Telehealth: Payer: Self-pay

## 2011-03-30 MED ORDER — DABIGATRAN ETEXILATE MESYLATE 150 MG PO CAPS: 150.0000 mg | ORAL_CAPSULE | Freq: Two times a day (BID) | ORAL | Status: DC

## 2011-03-30 NOTE — Telephone Encounter (Signed)
Refill sent for pradaxa 150 mg take one tablet bid  

## 2011-04-06 ENCOUNTER — Ambulatory Visit (INDEPENDENT_AMBULATORY_CARE_PROVIDER_SITE_OTHER): Payer: PRIVATE HEALTH INSURANCE | Admitting: Family Medicine

## 2011-04-06 ENCOUNTER — Encounter: Payer: Self-pay | Admitting: Family Medicine

## 2011-04-06 VITALS — BP 130/76 | HR 80 | Temp 97.6°F | Wt 224.5 lb

## 2011-04-06 DIAGNOSIS — J449 Chronic obstructive pulmonary disease, unspecified: Secondary | ICD-10-CM

## 2011-04-06 DIAGNOSIS — E119 Type 2 diabetes mellitus without complications: Secondary | ICD-10-CM

## 2011-04-06 DIAGNOSIS — E785 Hyperlipidemia, unspecified: Secondary | ICD-10-CM

## 2011-04-06 NOTE — Assessment & Plan Note (Addendum)
Recent [facility] admit for R PNA, COPD exacerbation.  Completed prednisone and levaquin. Started on advair and spiriva per d/c summary. Discussed will likely need these long term. Using O2 at night and PRN SOB during day. much improved but lungs still coarse.

## 2011-04-06 NOTE — Progress Notes (Signed)
  Subjective:    Patient ID: [name], male    DOB: 05-04-1933, 78 y.o.   MRN: [id number]  HPI CC: hosp f/u  78 yo with complicated history including bladder cancer s/p ileal conduit, cryptogenic cirrhosis, uncontrolled T2DM, HTN, HLD and O2 PRN (2LNC) COPD and grade 1 dCHF presents for hospital f/u.  H/o chronic Afib, rate controlled on beta blocker and pradaxa.  Seen last month with worsening cough, dyspnea and hypoxia, sent to [facility] ER and hospitalized at [facility] 2/25-27/2013.  D/C dx was R PNA, acute COPD exacerbation.  Sent home with prednisone taper, 5d levaquin, as well as restarted advair and spiriva.  Done with steroids.  Saw Dr. Meredeth Ide last week, added on spiriva and advair.  Feels breathing back to baseline.  Using o2 PRN and at night time.  To have sleep study scheduled by pulm.  Next appt with pulm in 6 mo per pt recollection.  Reviewed D/C summary.  CXR - minimal RUL, perihilar and R lung base atx vs infiltrate.  BNP noted to be 790, CHF not really addressed.  CE negative.  CMP normal.  Blood cultures NGTD.  LDL 50.  Leg swelling - staying present worse with prolonged standing.  R>L.    Denies SOB, cough improving but still present, chest pain/tightness, dizziness, syncope or presyncope.  Sugars staying elevated 2/2 prednisone.  Low - 97, high - 250.  Recent prednisone use as well as sweets.  Past Medical History  Diagnosis Date  . Diabetes mellitus, type 2   . Hyperlipidemia   . Hypertension   . Cryptogenic cirrhosis     Followed by Dr Russella Dar, never biopsied, has been stable.  History of gastric varices.  . Pyloric stenosis     mild EGD 10/26/06  . Inguinal hernia     left  . Iron deficiency anemia   . CHF (congestive heart failure)     diastolic. Echo 5/10 with mild LVH, EF 75%, grade 1 diastolic dysfunction, severe left atrial enlargement, aortic sclerosis  . Bladder cancer 1984    s/p ileal conduit  . Hemorrhoids   . B12 deficiency   . COPD (chronic obstructive  pulmonary disease)     per pt (Dr. Meredeth Ide)   Review of Systems Per HPI    Objective:   Physical Exam  Nursing note and vitals reviewed. Constitutional: He appears well-developed and well-nourished. No distress.  HENT:  Head: Normocephalic and atraumatic.  Mouth/Throat: Oropharynx is clear and moist. No oropharyngeal exudate.  Eyes: Conjunctivae and EOM are normal. Pupils are equal, round, and reactive to light. No scleral icterus.  Neck: Normal range of motion. Neck supple. Carotid bruit is not present.  Cardiovascular: Normal rate, regular rhythm, normal heart sounds and intact distal pulses.   No murmur heard. Pulmonary/Chest: Effort normal and breath sounds normal. No respiratory distress. He has no wheezes. He has no rales.       Coarse breath sounds R>L.  Abdominal: Soft. There is no tenderness.       No abd/renal bruits  Musculoskeletal: He exhibits edema (1+ pitting edema to mid calf).  Lymphadenopathy:    He has no cervical adenopathy.  Skin: Skin is warm and dry. No rash noted.  Psychiatric: He has a normal mood and affect.       Assessment & Plan:

## 2011-04-06 NOTE — Assessment & Plan Note (Signed)
Check blood work next visit.

## 2011-04-06 NOTE — Patient Instructions (Signed)
Good to see you today.  Call us with questions. Return to see me in 3-4 months, sooner if needed.  Come in a few days prior to next visit fasting for blood work. We will check iron levels next time.

## 2011-05-06 ENCOUNTER — Ambulatory Visit: Payer: PRIVATE HEALTH INSURANCE | Admitting: Family Medicine

## 2011-05-07 ENCOUNTER — Other Ambulatory Visit: Payer: Self-pay | Admitting: Family Medicine

## 2011-05-22 ENCOUNTER — Other Ambulatory Visit: Payer: Self-pay | Admitting: Family Medicine

## 2011-06-11 ENCOUNTER — Telehealth: Payer: Self-pay | Admitting: Cardiovascular Disease

## 2011-06-11 LAB — CBC
HCT: 34 % — ABNORMAL LOW (ref 40.0–52.0)
MCHC: 33.3 g/dL (ref 32.0–36.0)
MCV: 91 fL (ref 80–100)
RDW: 15.7 % — ABNORMAL HIGH (ref 11.5–14.5)
WBC: 6.6 10*3/uL (ref 3.8–10.6)

## 2011-06-11 LAB — COMPREHENSIVE METABOLIC PANEL
Albumin: 4.1 g/dL (ref 3.4–5.0)
Alkaline Phosphatase: 110 U/L (ref 50–136)
BUN: 20 mg/dL — ABNORMAL HIGH (ref 7–18)
Co2: 28 mmol/L (ref 21–32)
EGFR (African American): >60
EGFR (Non-African Amer.): >60
Glucose: 153 mg/dL — ABNORMAL HIGH (ref 65–99)
Potassium: 4.2 mmol/L (ref 3.5–5.1)
Sodium: 153 mmol/L — ABNORMAL HIGH (ref 136–145)
Total Protein: 7.5 g/dL (ref 6.4–8.2)

## 2011-06-11 LAB — PRO B NATRIURETIC PEPTIDE: B-Type Natriuretic Peptide: 608 pg/mL — ABNORMAL HIGH (ref 0–450)

## 2011-06-11 NOTE — Telephone Encounter (Signed)
Pt daughter called back stating that pt legs are getting bigger and having more SOB

## 2011-06-11 NOTE — Telephone Encounter (Signed)
Can we followup with this patient on Monday. Rather than send to the emergency room, would increase Lasix which they have at home, decreased oral fluid intake with close followup in clinic.

## 2011-06-11 NOTE — Telephone Encounter (Signed)
Patient's daughter called, stated patient has increase fluid for 1 1/2 months.States patient is getting worse,sob,non productive cough,swelling in feet and legs.Advised to take patient to ER now.

## 2011-06-11 NOTE — Telephone Encounter (Signed)
Spoke to patient's daughter patient has gone to ER.States they were very concerned about their father,very sob and increase swelling.Advised to call Dr.Gollan's office on Monday 06/14/11 for advice on when Dr.Gollan needs to see patient.

## 2011-06-11 NOTE — Telephone Encounter (Signed)
Pt daughter called stating that pt is having a lot of swelling in his feet and legs. Please call pt to advise what to do

## 2011-06-12 ENCOUNTER — Inpatient Hospital Stay: Payer: Self-pay | Admitting: Internal Medicine

## 2011-06-12 DIAGNOSIS — I059 Rheumatic mitral valve disease, unspecified: Secondary | ICD-10-CM

## 2011-06-12 DIAGNOSIS — I509 Heart failure, unspecified: Secondary | ICD-10-CM

## 2011-06-12 LAB — CBC WITH DIFFERENTIAL/PLATELET
Basophil #: 0 10*3/uL (ref 0.0–0.1)
Basophil %: 0.6 %
Eosinophil #: 0.3 10*3/uL (ref 0.0–0.7)
Eosinophil %: 5.3 %
MCH: 29.9 pg (ref 26.0–34.0)
MCV: 92 fL (ref 80–100)
Monocyte #: 0.7 x10 3/mm (ref 0.2–1.0)
Neutrophil %: 65.2 %
Platelet: 150 10*3/uL (ref 150–440)
RBC: 3.33 10*6/uL — ABNORMAL LOW (ref 4.40–5.90)
RDW: 15.6 % — ABNORMAL HIGH (ref 11.5–14.5)
WBC: 5.9 10*3/uL (ref 3.8–10.6)

## 2011-06-12 LAB — BASIC METABOLIC PANEL
Anion Gap: 8 (ref 7–16)
Chloride: 107 mmol/L (ref 98–107)
EGFR (African American): >60
EGFR (Non-African Amer.): >60

## 2011-06-13 LAB — BASIC METABOLIC PANEL
Anion Gap: 9 (ref 7–16)
BUN: 20 mg/dL — ABNORMAL HIGH (ref 7–18)
Calcium, Total: 8.6 mg/dL (ref 8.5–10.1)
Chloride: 107 mmol/L (ref 98–107)
Co2: 28 mmol/L (ref 21–32)
EGFR (African American): >60
EGFR (Non-African Amer.): >60
Glucose: 70 mg/dL (ref 65–99)
Osmolality: 288 (ref 275–301)
Potassium: 4 mmol/L (ref 3.5–5.1)
Sodium: 144 mmol/L (ref 136–145)

## 2011-06-14 LAB — BASIC METABOLIC PANEL
Anion Gap: 8 (ref 7–16)
Chloride: 101 mmol/L (ref 98–107)
Creatinine: 1.08 mg/dL (ref 0.60–1.30)
EGFR (Non-African Amer.): >60
Glucose: 76 mg/dL (ref 65–99)
Osmolality: 286 (ref 275–301)
Sodium: 142 mmol/L (ref 136–145)

## 2011-06-14 LAB — MAGNESIUM: Magnesium: 1.7 mg/dL — ABNORMAL LOW

## 2011-06-15 LAB — BASIC METABOLIC PANEL
Anion Gap: 8 (ref 7–16)
BUN: 27 mg/dL — ABNORMAL HIGH (ref 7–18)
Calcium, Total: 8.3 mg/dL — ABNORMAL LOW (ref 8.5–10.1)
Chloride: 103 mmol/L (ref 98–107)
Co2: 30 mmol/L (ref 21–32)
Creatinine: 1.13 mg/dL (ref 0.60–1.30)
EGFR (African American): >60
Osmolality: 286 (ref 275–301)
Sodium: 141 mmol/L (ref 136–145)

## 2011-06-21 ENCOUNTER — Encounter: Payer: Self-pay | Admitting: Family Medicine

## 2011-06-23 ENCOUNTER — Encounter: Payer: Self-pay | Admitting: Cardiovascular Disease

## 2011-06-23 ENCOUNTER — Encounter: Payer: Self-pay | Admitting: Family Medicine

## 2011-06-23 ENCOUNTER — Ambulatory Visit (INDEPENDENT_AMBULATORY_CARE_PROVIDER_SITE_OTHER): Payer: PRIVATE HEALTH INSURANCE | Admitting: Family Medicine

## 2011-06-23 VITALS — BP 118/68 | HR 66 | Temp 97.7°F | Wt 212.0 lb

## 2011-06-23 DIAGNOSIS — R0609 Other forms of dyspnea: Secondary | ICD-10-CM

## 2011-06-23 DIAGNOSIS — I509 Heart failure, unspecified: Secondary | ICD-10-CM

## 2011-06-23 DIAGNOSIS — J449 Chronic obstructive pulmonary disease, unspecified: Secondary | ICD-10-CM

## 2011-06-23 DIAGNOSIS — R0989 Other specified symptoms and signs involving the circulatory and respiratory systems: Secondary | ICD-10-CM

## 2011-06-23 DIAGNOSIS — I5033 Acute on chronic diastolic (congestive) heart failure: Secondary | ICD-10-CM

## 2011-06-23 DIAGNOSIS — E119 Type 2 diabetes mellitus without complications: Secondary | ICD-10-CM

## 2011-06-23 DIAGNOSIS — R0600 Dyspnea, unspecified: Secondary | ICD-10-CM

## 2011-06-23 LAB — BASIC METABOLIC PANEL
CO2: 31 mEq/L (ref 19–32)
Chloride: 103 mEq/L (ref 96–112)
Potassium: 4.2 mEq/L (ref 3.5–5.1)
Sodium: 143 mEq/L (ref 135–145)

## 2011-06-23 LAB — HEMOGLOBIN A1C: Hgb A1c MFr Bld: 4.9 % (ref 4.6–6.5)

## 2011-06-23 NOTE — Progress Notes (Signed)
Subjective:    Patient ID: [name], male    DOB: 05-16-1933, 78 y.o.   MRN: [id number]  HPI CC: hosp f/u  [name] is a pleasant 78 yo with complicated history including bladder cancer s/p ileal conduit, cryptogenic cirrhosis, T2DM, HTN, HLD and O2 dependent COPD and grade 1 dCHF who presents today for hospital f/u. Also h/o chronic Afib, rate controlled on beta blocker and pradaxa.  Recent admission from 5/18-21/2013 with diagnosis of acute on chronic resp failure and acute CHF exacerbation.  Pulse ox down to 74% on RA with ambulation during hospitalization, advised to use oxygen regularly.  Treated for CHF with IV diuresis and sent home with addition of low dose lisinopril (although already on cozaar?), lasix 40mg  bid as well as prednisone taper and zpack which he has finished.  Did diurese 10+ lbs during hospitalization.  BNp on admission was 608, TnI negative.  Glucose was 153, BUN 20 and Cr 0.98.  LFTs WNL, Hgb 11.3, plt 172.  CXR showed small B pleural effusions and mild pulm edema.  EKG showed afib.  Discharge creatinine was 1.13.  Magnesium was 1.7 so he was started on daily oral magnesium supplementation.  Advised to stop norvasc and amaryl (did not know about amaryl so has continued taking).   Started on klorcon with increased lasix. Advised to use O2 24/7.  Since he's been home, feeling good but noted increased diarrhea recently.  Some bowel urgency with this.  Seems to be getting better.  Also concerned not getting enough fluid.  Continued cough but loosening up some.  Stays some SOB but denies significant DOE.  Overall improved.  No fevers/chills, abd pain, n/v, HA, chest pain.  Has cards appt 07/05/2011 with Dr. Mariah Milling.  No records of echo that was done in hospital.  Brings log of vitals at home over the past week (daughter has helped him keep track): Weight dropping from 218 to 210 since discharge. Blood pressure 102-135/40-60s HR 60-80s O2 88-96% Fasting sugars  83-145  Past Medical History  Diagnosis Date  . Diabetes mellitus, type 2   . Hyperlipidemia   . Hypertension   . Cryptogenic cirrhosis     Followed by Dr Russella Dar, never biopsied, has been stable.  History of gastric varices.  . Pyloric stenosis     mild EGD 10/26/06  . Inguinal hernia     left  . Iron deficiency anemia   . CHF (congestive heart failure)     diastolic. Echo 5/10 with mild LVH, EF 75%, grade 1 diastolic dysfunction, severe left atrial enlargement, aortic sclerosis  . Bladder cancer 1984    s/p ileal conduit  . Hemorrhoids   . B12 deficiency   . COPD (chronic obstructive pulmonary disease)     (Dr. Meredeth Ide)  . Chronic a-fib     on pradaxa  . History of ileostomy   . History of bladder cancer   . History of pneumonia 2012     Review of Systems Per HPI    Objective:   Physical Exam  Nursing note and vitals reviewed. Constitutional: He appears well-developed and well-nourished. No distress.       Wearing nasal cannula  HENT:  Head: Normocephalic and atraumatic.  Mouth/Throat: Oropharynx is clear and moist. No oropharyngeal exudate.  Eyes: Conjunctivae and EOM are normal. Pupils are equal, round, and reactive to light. No scleral icterus.  Neck: Normal range of motion. Neck supple. No hepatojugular reflux and no JVD present. Carotid bruit is not present.  Cardiovascular: Normal rate, normal heart sounds and intact distal pulses.  An irregular rhythm present.  No murmur heard. Pulmonary/Chest: Effort normal and breath sounds normal. No respiratory distress. He has no wheezes. He has no rales.       Mild bibasilar crackles  Abdominal: Soft. Bowel sounds are normal. He exhibits no distension. There is no tenderness. There is no rebound and no guarding.  Musculoskeletal: He exhibits edema (trace pedal edema).  Skin: Skin is warm and dry. No rash noted.  Psychiatric: He has a normal mood and affect.       Assessment & Plan:

## 2011-06-23 NOTE — Patient Instructions (Signed)
Continue meds as up to now. We will check blood work today and call you if any changes needed. Keep appointmnet with Dr. Mariah Milling. Continue glimeperide and metformin as sugars are doing better. I'm glad blood pressure is doing well.

## 2011-06-23 NOTE — Assessment & Plan Note (Signed)
Finished steroid taper as well as zpack. Stable currently.

## 2011-06-23 NOTE — Assessment & Plan Note (Addendum)
Accidentally continued amaryl and metformin although plan was to hold amaryl. Per log of fasting sugars he brings, good control. Continue meds.  Check A1c today.  ADDENDUM ==> A1c returned at 4.9%, unsure if I believe this number.  However given Cr elevated to 1.7, have asked him to back off metformin to 500mg  nightly.

## 2011-06-23 NOTE — Assessment & Plan Note (Addendum)
Reviewed recent records from hospitalization.  No evidence of fluid overload on exam today. Will check BNP and Cr given recent increase in lasix, addition of lisinopril, and complying very closely with low salt/ low fluid diet he was sent home with (2000mg /day, 1263mL/day). I'm somewhat confused why lisinopril was added even when on ARB.  If Cr elevated, will likely d/c lisinopril. No record of echo from hospitalization yet.  ADDENDUM ==> blood work returned showing ARF with Cr 1.7.  Likely too aggressive diuresis.  Will recommend be more liberal in fluid intake over next several days, d/c lisinopril, and decrease lasix to 20mg  bid until sees Dr. Mariah Milling.  Will route note to him.

## 2011-06-24 LAB — CBC WITH DIFFERENTIAL/PLATELET
Basophils Absolute: 0 10*3/uL (ref 0.0–0.1)
Basophils Relative: 0.4 % (ref 0.0–3.0)
Eosinophils Absolute: 0.5 10*3/uL (ref 0.0–0.7)
Lymphocytes Relative: 17.6 % (ref 12.0–46.0)
MCHC: 32.2 g/dL (ref 30.0–36.0)
MCV: 90.1 fl (ref 78.0–100.0)
Monocytes Absolute: 0.9 10*3/uL (ref 0.1–1.0)
Neutrophils Relative %: 67.9 % (ref 43.0–77.0)
RBC: 4.1 Mil/uL — ABNORMAL LOW (ref 4.22–5.81)
RDW: 14.9 % — ABNORMAL HIGH (ref 11.5–14.6)

## 2011-06-26 ENCOUNTER — Emergency Department (HOSPITAL_COMMUNITY): Payer: PRIVATE HEALTH INSURANCE

## 2011-06-26 ENCOUNTER — Inpatient Hospital Stay (HOSPITAL_COMMUNITY)
Admission: EM | Admit: 2011-06-26 | Discharge: 2011-07-06 | DRG: 811 | Disposition: A | Discharge status: Home / Self Care | Payer: PRIVATE HEALTH INSURANCE | Source: Ambulatory Visit | Attending: Internal Medicine | Admitting: Internal Medicine

## 2011-06-26 ENCOUNTER — Inpatient Hospital Stay (HOSPITAL_COMMUNITY): Payer: PRIVATE HEALTH INSURANCE

## 2011-06-26 ENCOUNTER — Other Ambulatory Visit: Payer: Self-pay | Admitting: Family Medicine

## 2011-06-26 DIAGNOSIS — K7460 Unspecified cirrhosis of liver: Secondary | ICD-10-CM

## 2011-06-26 DIAGNOSIS — J9620 Acute and chronic respiratory failure, unspecified whether with hypoxia or hypercapnia: Secondary | ICD-10-CM | POA: Diagnosis present

## 2011-06-26 DIAGNOSIS — J4489 Other specified chronic obstructive pulmonary disease: Secondary | ICD-10-CM | POA: Diagnosis present

## 2011-06-26 DIAGNOSIS — I864 Gastric varices: Secondary | ICD-10-CM

## 2011-06-26 DIAGNOSIS — Z8551 Personal history of malignant neoplasm of bladder: Secondary | ICD-10-CM

## 2011-06-26 DIAGNOSIS — D485 Neoplasm of uncertain behavior of skin: Secondary | ICD-10-CM

## 2011-06-26 DIAGNOSIS — E162 Hypoglycemia, unspecified: Secondary | ICD-10-CM | POA: Diagnosis present

## 2011-06-26 DIAGNOSIS — R58 Hemorrhage, not elsewhere classified: Secondary | ICD-10-CM | POA: Diagnosis present

## 2011-06-26 DIAGNOSIS — I868 Varicose veins of other specified sites: Secondary | ICD-10-CM | POA: Diagnosis present

## 2011-06-26 DIAGNOSIS — E782 Mixed hyperlipidemia: Secondary | ICD-10-CM

## 2011-06-26 DIAGNOSIS — I509 Heart failure, unspecified: Secondary | ICD-10-CM | POA: Diagnosis present

## 2011-06-26 DIAGNOSIS — N390 Urinary tract infection, site not specified: Secondary | ICD-10-CM

## 2011-06-26 DIAGNOSIS — A0472 Enterocolitis due to Clostridium difficile, not specified as recurrent: Secondary | ICD-10-CM | POA: Diagnosis not present

## 2011-06-26 DIAGNOSIS — N179 Acute kidney failure, unspecified: Secondary | ICD-10-CM

## 2011-06-26 DIAGNOSIS — D518 Other vitamin B12 deficiency anemias: Secondary | ICD-10-CM

## 2011-06-26 DIAGNOSIS — K4090 Unilateral inguinal hernia, without obstruction or gangrene, not specified as recurrent: Secondary | ICD-10-CM

## 2011-06-26 DIAGNOSIS — I10 Essential (primary) hypertension: Secondary | ICD-10-CM

## 2011-06-26 DIAGNOSIS — J449 Chronic obstructive pulmonary disease, unspecified: Secondary | ICD-10-CM

## 2011-06-26 DIAGNOSIS — K922 Gastrointestinal hemorrhage, unspecified: Secondary | ICD-10-CM

## 2011-06-26 DIAGNOSIS — K3189 Other diseases of stomach and duodenum: Secondary | ICD-10-CM | POA: Clinically undetermined

## 2011-06-26 DIAGNOSIS — R0600 Dyspnea, unspecified: Secondary | ICD-10-CM

## 2011-06-26 DIAGNOSIS — E1169 Type 2 diabetes mellitus with other specified complication: Secondary | ICD-10-CM

## 2011-06-26 DIAGNOSIS — R768 Other specified abnormal immunological findings in serum: Secondary | ICD-10-CM | POA: Clinically undetermined

## 2011-06-26 DIAGNOSIS — I359 Nonrheumatic aortic valve disorder, unspecified: Secondary | ICD-10-CM

## 2011-06-26 DIAGNOSIS — Z9981 Dependence on supplemental oxygen: Secondary | ICD-10-CM

## 2011-06-26 DIAGNOSIS — Q400 Congenital hypertrophic pyloric stenosis: Secondary | ICD-10-CM

## 2011-06-26 DIAGNOSIS — D62 Acute posthemorrhagic anemia: Principal | ICD-10-CM

## 2011-06-26 DIAGNOSIS — K7469 Other cirrhosis of liver: Secondary | ICD-10-CM | POA: Diagnosis present

## 2011-06-26 DIAGNOSIS — I4891 Unspecified atrial fibrillation: Secondary | ICD-10-CM

## 2011-06-26 DIAGNOSIS — I5032 Chronic diastolic (congestive) heart failure: Secondary | ICD-10-CM | POA: Diagnosis present

## 2011-06-26 DIAGNOSIS — R791 Abnormal coagulation profile: Secondary | ICD-10-CM | POA: Diagnosis present

## 2011-06-26 DIAGNOSIS — I9589 Other hypotension: Secondary | ICD-10-CM | POA: Diagnosis present

## 2011-06-26 DIAGNOSIS — E119 Type 2 diabetes mellitus without complications: Secondary | ICD-10-CM

## 2011-06-26 DIAGNOSIS — A413 Sepsis due to Hemophilus influenzae: Secondary | ICD-10-CM

## 2011-06-26 DIAGNOSIS — E785 Hyperlipidemia, unspecified: Secondary | ICD-10-CM

## 2011-06-26 DIAGNOSIS — D509 Iron deficiency anemia, unspecified: Secondary | ICD-10-CM

## 2011-06-26 DIAGNOSIS — K648 Other hemorrhoids: Secondary | ICD-10-CM

## 2011-06-26 DIAGNOSIS — R319 Hematuria, unspecified: Secondary | ICD-10-CM

## 2011-06-26 DIAGNOSIS — Z7901 Long term (current) use of anticoagulants: Secondary | ICD-10-CM

## 2011-06-26 DIAGNOSIS — I5030 Unspecified diastolic (congestive) heart failure: Secondary | ICD-10-CM

## 2011-06-26 DIAGNOSIS — I959 Hypotension, unspecified: Secondary | ICD-10-CM

## 2011-06-26 DIAGNOSIS — I5033 Acute on chronic diastolic (congestive) heart failure: Secondary | ICD-10-CM

## 2011-06-26 DIAGNOSIS — J9610 Chronic respiratory failure, unspecified whether with hypoxia or hypercapnia: Secondary | ICD-10-CM | POA: Diagnosis present

## 2011-06-26 DIAGNOSIS — K31819 Angiodysplasia of stomach and duodenum without bleeding: Secondary | ICD-10-CM

## 2011-06-26 HISTORY — DX: Shortness of breath: R06.02

## 2011-06-26 LAB — DIFFERENTIAL
Eosinophils Absolute: 0.4 10*3/uL (ref 0.0–0.7)
Eosinophils Relative: 2 % (ref 0–5)
Lymphocytes Relative: 16 % (ref 12–46)
Lymphs Abs: 2.6 10*3/uL (ref 0.7–4.0)
Monocytes Absolute: 1 10*3/uL (ref 0.1–1.0)

## 2011-06-26 LAB — URINALYSIS, ROUTINE W REFLEX MICROSCOPIC
Bilirubin Urine: NEGATIVE
Ketones, ur: NEGATIVE mg/dL
Protein, ur: NEGATIVE mg/dL
Specific Gravity, Urine: 1.012 (ref 1.005–1.030)
Urobilinogen, UA: 0.2 mg/dL (ref 0.0–1.0)

## 2011-06-26 LAB — TROPONIN I: Troponin I: <0.3 ng/mL (ref <0.30)

## 2011-06-26 LAB — CBC
HCT: 29.2 % — ABNORMAL LOW (ref 39.0–52.0)
MCH: 29.1 pg (ref 26.0–34.0)
MCV: 89.3 fL (ref 78.0–100.0)
Platelets: 265 10*3/uL (ref 150–400)
RBC: 3.27 MIL/uL — ABNORMAL LOW (ref 4.22–5.81)
RDW: 14.3 % (ref 11.5–15.5)
WBC: 16.2 10*3/uL — ABNORMAL HIGH (ref 4.0–10.5)

## 2011-06-26 LAB — LACTIC ACID, PLASMA: Lactic Acid, Venous: 2.3 mmol/L — ABNORMAL HIGH (ref 0.5–2.2)

## 2011-06-26 LAB — GLUCOSE, CAPILLARY
Glucose-Capillary: 45 mg/dL — ABNORMAL LOW (ref 70–99)
Glucose-Capillary: 65 mg/dL — ABNORMAL LOW (ref 70–99)

## 2011-06-26 LAB — PHOSPHORUS: Phosphorus: 3.8 mg/dL (ref 2.3–4.6)

## 2011-06-26 LAB — AMMONIA: Ammonia: 108 umol/L — ABNORMAL HIGH (ref 11–60)

## 2011-06-26 LAB — COMPREHENSIVE METABOLIC PANEL
BUN: 113 mg/dL — ABNORMAL HIGH (ref 6–23)
CO2: 24 mEq/L (ref 19–32)
Calcium: 9.4 mg/dL (ref 8.4–10.5)
Chloride: 104 mEq/L (ref 96–112)
Creatinine, Ser: 2.65 mg/dL — ABNORMAL HIGH (ref 0.50–1.35)
GFR calc Af Amer: 25 mL/min — ABNORMAL LOW (ref >90)
GFR calc non Af Amer: 22 mL/min — ABNORMAL LOW (ref >90)
Glucose, Bld: 82 mg/dL (ref 70–99)
Total Bilirubin: 0.5 mg/dL (ref 0.3–1.2)

## 2011-06-26 LAB — PROTIME-INR
INR: 2.96 — ABNORMAL HIGH (ref 0.00–1.49)
Prothrombin Time: 31.3 seconds — ABNORMAL HIGH (ref 11.6–15.2)

## 2011-06-26 MED ORDER — DOCUSATE SODIUM 100 MG PO CAPS: 100.0000 mg | ORAL_CAPSULE | Freq: Two times a day (BID) | ORAL | Status: DC

## 2011-06-26 MED ORDER — INSULIN ASPART 100 UNIT/ML ~~LOC~~ SOLN
0.0000 [IU] | Freq: Three times a day (TID) | SUBCUTANEOUS | Status: DC
  Administered 2011-06-27 – 2011-06-29 (×7): 2 [IU] via SUBCUTANEOUS
  Administered 2011-06-29 – 2011-06-30 (×2): 1 [IU] via SUBCUTANEOUS
  Administered 2011-07-02: 2 [IU] via SUBCUTANEOUS
  Administered 2011-07-02: 1 [IU] via SUBCUTANEOUS
  Administered 2011-07-02: 2 [IU] via SUBCUTANEOUS
  Administered 2011-07-03 – 2011-07-05 (×4): 1 [IU] via SUBCUTANEOUS

## 2011-06-26 MED ORDER — SODIUM CHLORIDE 0.9 % IV BOLUS (SEPSIS)
500.0000 mL | INTRAVENOUS | Status: AC
  Administered 2011-06-26: 500 mL via INTRAVENOUS

## 2011-06-26 MED ORDER — DEXTROSE 250 MG/ML IV SOLN
12.5000 mg | Freq: Once | INTRAVENOUS | Status: AC
  Administered 2011-06-26: 12.5 mg via INTRAVENOUS

## 2011-06-26 MED ORDER — TIOTROPIUM BROMIDE MONOHYDRATE 18 MCG IN CAPS
18.0000 ug | ORAL_CAPSULE | Freq: Every day | RESPIRATORY_TRACT | Status: DC
  Administered 2011-06-27 – 2011-07-06 (×10): 18 ug via RESPIRATORY_TRACT
  Filled 2011-06-26 (×3): qty 5

## 2011-06-26 MED ORDER — CIPROFLOXACIN IN D5W 400 MG/200ML IV SOLN
400.0000 mg | INTRAVENOUS | Status: DC
  Administered 2011-06-28 (×2): 400 mg via INTRAVENOUS
  Filled 2011-06-26 (×3): qty 200

## 2011-06-26 MED ORDER — IPRATROPIUM BROMIDE 0.02 % IN SOLN
0.5000 mg | Freq: Four times a day (QID) | RESPIRATORY_TRACT | Status: DC
  Administered 2011-06-27 (×4): 0.5 mg via RESPIRATORY_TRACT
  Filled 2011-06-26 (×4): qty 2.5

## 2011-06-26 MED ORDER — DEXTROSE 50 % IV SOLN
INTRAVENOUS | Status: AC
  Administered 2011-06-26: 12.5 g
  Filled 2011-06-26: qty 50

## 2011-06-26 MED ORDER — ACETAMINOPHEN 325 MG PO TABS: 650.0000 mg | ORAL_TABLET | Freq: Four times a day (QID) | ORAL | Status: DC | PRN

## 2011-06-26 MED ORDER — ASPIRIN EC 81 MG PO TBEC
81.0000 mg | DELAYED_RELEASE_TABLET | Freq: Every day | ORAL | Status: DC
  Administered 2011-06-27: 81 mg via ORAL
  Filled 2011-06-26: qty 1

## 2011-06-26 MED ORDER — ADULT MULTIVITAMIN W/MINERALS CH
1.0000 | ORAL_TABLET | Freq: Every day | ORAL | Status: DC
  Administered 2011-06-27: 1 via ORAL
  Filled 2011-06-26: qty 1

## 2011-06-26 MED ORDER — FLUTICASONE PROPIONATE HFA 110 MCG/ACT IN AERO
2.0000 | INHALATION_SPRAY | Freq: Two times a day (BID) | RESPIRATORY_TRACT | Status: DC
  Administered 2011-06-27 – 2011-07-06 (×20): 2 via RESPIRATORY_TRACT
  Filled 2011-06-26: qty 12

## 2011-06-26 MED ORDER — ONDANSETRON HCL 4 MG PO TABS: 4.0000 mg | ORAL_TABLET | Freq: Four times a day (QID) | ORAL | Status: DC | PRN

## 2011-06-26 MED ORDER — ACETAMINOPHEN 650 MG RE SUPP: 650.0000 mg | Freq: Four times a day (QID) | RECTAL | Status: DC | PRN

## 2011-06-26 MED ORDER — VANCOMYCIN HCL IN DEXTROSE 1-5 GM/200ML-% IV SOLN
1000.0000 mg | INTRAVENOUS | Status: DC
  Filled 2011-06-26: qty 200

## 2011-06-26 MED ORDER — ALBUTEROL SULFATE (5 MG/ML) 0.5% IN NEBU
2.5000 mg | INHALATION_SOLUTION | Freq: Four times a day (QID) | RESPIRATORY_TRACT | Status: DC
  Administered 2011-06-27 – 2011-06-28 (×5): 2.5 mg via RESPIRATORY_TRACT
  Filled 2011-06-26 (×5): qty 0.5

## 2011-06-26 MED ORDER — SODIUM CHLORIDE 0.9 % IV BOLUS (SEPSIS)
1000.0000 mL | Freq: Once | INTRAVENOUS | Status: AC
  Administered 2011-06-26: 1000 mL via INTRAVENOUS

## 2011-06-26 MED ORDER — PANTOPRAZOLE SODIUM 40 MG PO TBEC: 40.0000 mg | DELAYED_RELEASE_TABLET | Freq: Every day | ORAL | Status: DC

## 2011-06-26 MED ORDER — VANCOMYCIN HCL 1000 MG IV SOLR
1500.0000 mg | Freq: Once | INTRAVENOUS | Status: AC
  Administered 2011-06-26: 1500 mg via INTRAVENOUS
  Filled 2011-06-26: qty 1500

## 2011-06-26 MED ORDER — ALBUTEROL SULFATE (5 MG/ML) 0.5% IN NEBU: 2.5000 mg | INHALATION_SOLUTION | RESPIRATORY_TRACT | Status: DC | PRN

## 2011-06-26 MED ORDER — ONDANSETRON HCL 4 MG/2ML IJ SOLN: 4.0000 mg | Freq: Four times a day (QID) | INTRAMUSCULAR | Status: DC | PRN

## 2011-06-26 MED ORDER — PIPERACILLIN-TAZOBACTAM 3.375 G IVPB
3.3750 g | Freq: Three times a day (TID) | INTRAVENOUS | Status: DC
  Administered 2011-06-27: 3.375 g via INTRAVENOUS
  Filled 2011-06-26 (×4): qty 50

## 2011-06-26 MED ORDER — FERROUS SULFATE 325 (65 FE) MG PO TABS
325.0000 mg | ORAL_TABLET | ORAL | Status: DC
  Administered 2011-06-27: 325 mg via ORAL
  Filled 2011-06-26: qty 1

## 2011-06-26 MED ORDER — SODIUM CHLORIDE 0.45 % IV SOLN
INTRAVENOUS | Status: DC
  Administered 2011-06-27: 1000 mL via INTRAVENOUS

## 2011-06-26 MED ORDER — CIPROFLOXACIN IN D5W 400 MG/200ML IV SOLN: 400.0000 mg | Freq: Once | INTRAVENOUS | Status: DC

## 2011-06-26 MED ORDER — ASPIRIN 81 MG PO TBEC: 81.0000 mg | DELAYED_RELEASE_TABLET | Freq: Every day | ORAL | Status: DC

## 2011-06-26 MED ORDER — PIPERACILLIN-TAZOBACTAM 3.375 G IVPB 30 MIN
3.3750 g | INTRAVENOUS | Status: DC
  Filled 2011-06-26: qty 50

## 2011-06-26 NOTE — ED Notes (Signed)
Patient transported to Ultrasound 

## 2011-06-26 NOTE — H&P (Addendum)
PCP:   Eustaquio Boyden, MD, MD   Chief Complaint:  Weakness and dizziness for the last 2 days.  HPI: [name] comes in today with complaints of generalized malaise and dizzinness associated with chills, and poor appetite ongoing for last 2 days but worse today. He has had a dry cough on and off, but denies abdominal pain, dysuria(although he has noticed that his urine is reddish colored in last couople of days). No diarrhea. In ED his BP was in the 70s at presentation, but responded to fluid challenges, and is in the low 100s systolic, occasionally in the 16X. His renal function has taken a turn for the worse, from a baseline creatinine of 1.2 in December 2012 to 2.65 today. Wbc was 09604 today, although it was 54098 on 06/23/11 when he was seen in the office by Dr Oren Section. Hemoglobin was also lower today(9.5, compared to 11.9 on 5/29). His sugars were also as low as 40mg /dl in ED. He was hence referred for admission. His cxr suggested mild right basilar linear atelectasis or scarring. Of note is that [name] was hospitalized at [facility] a week ago for acute on chronic respiratory failure/chf decompensation. He has continued to take hypoglycemics and BP meds as prescribed, including metformin/pradaxa/amaryl/zocor/losartan/coreg/lasix, which may be contributing to the ARF/hypoglycemia and hypotension.  Review of Systems:  Unremarkable except as highlighted in the hpi.  Past Medical History: Past Medical History  Diagnosis Date  . Diabetes mellitus, type 2   . Hyperlipidemia   . Hypertension   . Cryptogenic cirrhosis     Followed by Dr Russella Dar, never biopsied, has been stable.  History of gastric varices.  . Pyloric stenosis     mild EGD 10/26/06  . Inguinal hernia     left  . Iron deficiency anemia   . CHF (congestive heart failure)     diastolic. Echo 5/10 with mild LVH, EF 75%, grade 1 diastolic dysfunction, severe left atrial enlargement, aortic sclerosis  . Bladder cancer 1984      s/p ileal conduit  . Hemorrhoids   . B12 deficiency   . COPD (chronic obstructive pulmonary disease)     (Dr. Meredeth Ide)  . Chronic a-fib     on pradaxa  . History of ileostomy   . History of bladder cancer   . History of pneumonia 2012   Past Surgical History  Procedure Date  . Ileostomy 1984    bladder cancer  . Prostatectomy 1984    bladder cancer  . Penile prosthesis implant 1990    inflatable implant  . US echocardiography 05/1995    EF 60% TR, AI  . Colonoscopy 07/24/2003    Int. hemorrhoids  . Be 08/08/2003  . Colonoscopy 10/26/2006    Int hemorrhoids, 10 yrs  . Esophagogastroduodenoscopy 10/26/2006    Gastric varices, pyloric stenosis  . Bladder removal     s/p urostomy bag    Medications: Prior to Admission medications   Medication Sig Start Date End Date Taking? Authorizing Provider  albuterol-ipratropium (COMBIVENT) 18-103 MCG/ACT inhaler Inhale 2 puffs into the lungs every 6 (six) hours as needed. For wheezing   Yes Historical Provider, MD  aspirin 81 MG EC tablet Take 81 mg by mouth daily.     Yes Historical Provider, MD  carvedilol (COREG) 25 MG tablet Take 25 mg by mouth 2 (two) times daily with a meal.   Yes Historical Provider, MD  Cyanocobalamin (VITAMIN B-12) 2500 MCG SUBL Place 1 tablet under the tongue daily.  Yes Historical Provider, MD  dabigatran (PRADAXA) 150 MG CAPS Take 150 mg by mouth every 12 (twelve) hours. 03/30/11  Yes Antonieta Iba, MD  ferrous sulfate (FERRO-BOB) 325 (65 FE) MG tablet Take 325 mg by mouth every other [name].    Yes Historical Provider, MD  fluticasone (FLOVENT HFA) 110 MCG/ACT inhaler Inhale 2 puffs into the lungs 2 (two) times daily.   Yes Historical Provider, MD  furosemide (LASIX) 40 MG tablet Take 20 mg by mouth 2 (two) times daily.    Yes Historical Provider, MD  glimepiride (AMARYL) 2 MG tablet Take 2 mg by mouth daily before breakfast.   Yes Historical Provider, MD  losartan (COZAAR) 100 MG tablet Take 100 mg by  mouth daily.  02/13/11  Yes Eustaquio Boyden, MD  Magnesium 400 MG CAPS Take 400 mg by mouth daily.   Yes Historical Provider, MD  metFORMIN (GLUCOPHAGE) 500 MG tablet Take 500 mg by mouth 2 (two) times daily with a meal.  01/15/11  Yes Eustaquio Boyden, MD  potassium chloride SA (K-DUR,KLOR-CON) 20 MEQ tablet Take 20 mEq by mouth daily.   Yes Historical Provider, MD  simvastatin (ZOCOR) 20 MG tablet Take 20 mg by mouth at bedtime.   Yes Historical Provider, MD  tiotropium (SPIRIVA) 18 MCG inhalation capsule Place 18 mcg into inhaler and inhale daily.   Yes Historical Provider, MD  ONE TOUCH LANCETS MISC One daily and as needed/ ICD code 250.00     Historical Provider, MD  ONE TOUCH ULTRA TEST test strip TEST BLOOD SUGAR ONCE DAILY AS DIRECTED 02/27/11   Eustaquio Boyden, MD  Respiratory Therapy Supplies (NEBULIZER) DEVI by Does not apply route. Use as directed     Historical Provider, MD    Allergies:  No Known Allergies  Social History:  reports that he quit smoking about 22 years ago. His smoking use included Cigarettes. He has a 45 pack-year smoking history. He has never used smokeless tobacco. He reports that he drinks about .5 ounces of alcohol per week. He reports that he does not use illicit drugs.. Lives with his wife.  Family History: Family History  Problem Relation Age of Onset  . Hypertension Mother   . Osteoarthritis Mother   . Heart disease Father     MI  . Coronary artery disease Sister   . Diabetes Brother   . Heart disease Brother     MI  . Coronary artery disease Sister   . Colon cancer Neg Hx   . Stroke Other     GPs    Physical Exam: Filed Vitals:   06/26/11 1645 06/26/11 1834 06/26/11 1912 06/26/11 1913  BP: 117/103 117/36  101/44  Pulse: 94 86 83   Temp:      TempSrc:      Resp: 20 18    Height:    5' 8.11" (1.73 m)  Weight:    96.2 kg (212 lb 1.3 oz)  SpO2: 99% 99% 100%    Pale, not in distress, surrounded by family members. PERRLA. No jvd, no  carotid bruits, dry oral mucosa. Mild tachypnea. Good air entry bilaterally, No wheezing. Scattered rhonchi. S1S2 heard. No murmurs. RRR.  Abdomen- ileal conduit in place, urine has reddish color. Abdomen, soft, non tender. +BS CNS- grossly intact. Extremities- no pedal edema.   Labs on Admission:   [facility] 06/26/11 1645  NA 141  K 4.9  CL 104  CO2 24  GLUCOSE 82  BUN 113*  CREATININE 2.65*  CALCIUM 9.4  MG --  PHOS --    Basename 06/26/11 1645  AST 23  ALT 18  ALKPHOS 87  BILITOT 0.5  PROT 6.4  ALBUMIN 3.6   No results found for this basename: LIPASE:2,AMYLASE:2 in the last 72 hours  Basename 06/26/11 1645  WBC 16.2*  NEUTROABS 12.1*  HGB 9.5*  HCT 29.2*  MCV 89.3  PLT 265    Basename 06/26/11 1645  CKTOTAL --  CKMB --  CKMBINDEX --  TROPONINI <0.30   No results found for this basename: TSH,T4TOTAL,FREET3,T3FREE,THYROIDAB in the last 72 hours No results found for this basename: VITAMINB12:2,FOLATE:2,FERRITIN:2,TIBC:2,IRON:2,RETICCTPCT:2 in the last 72 hours  Radiological Exams on Admission: Dg Chest 2 View  06/26/2011  *RADIOLOGY REPORT*  Clinical Data: Nonproductive cough for the past 2 months.  Ex- smoker.  CHEST - 2 VIEW  Comparison: None.  Findings: Borderline enlarged cardiac silhouette.  Small amount of linear density at the right lung base.  Otherwise, clear lungs. The lungs are hyperexpanded with diffuse peribronchial thickening and mild prominence of the interstitial markings.  Diffuse osteopenia.  Mild thoracic spine degenerative changes.  IMPRESSION:  1.  Mild right basilar linear atelectasis or scarring. 2.  Changes of COPD and chronic bronchitis. 3.  Borderline cardiomegaly.  Original Report Authenticated By: Darrol Angel, M.D.    Assessment  78 year old diabetic male who has a septic picture with hypotension, leukocytosis, positive urinalysis, possibility of hcap. Infection likely responsible for poor appetite leading to dehydration AKI,  hypoglycemia, which is compounded by meds like metformin/amaryl/coreg/losaratan. He has hematuria while on pradaxa(?UTI causing hematuria, versus other causes). Likely, he has sepsis of a urinary source. I would worry for nosocomial organisms given recent hospitalization, especially in a diabetic. He may not be tachycardic due to the blunting effect of coreg, therefore masking a more complete septic picture.  Plan   .Sepsis associated hypotension- borderline BP. Admit SDU. Septic work up(BC/UC), lactic acid level/procalcitonin level/ ammonia level. Vanc/zosyn/cipro adjusted for crcl. Gentle fluids. Marland KitchenUTI (lower urinary tract infection)- follow UC. Triple abx. .Hematuria- pradaxa element. Hold pradaxa. Monitor. .Hypoglycemia- sepsis/meds related, in setting of acute renal failure. Expect to improve. Hold oral hypoglycemics. Marland KitchenAKI (acute kidney injury)- due to sepsis/diuretics/other BP meds. Check urine sodium/creatinine. Hold offending meds. Adjust abx for crcl. Check renal ultrasound. .A-fib- rate controlled. Hold pradaxa in view of hematuria and acute on chronic anemia. Hold coreg due to hypotension. .Diastolic CHF- hold BP meds due to sepsis, resume once sepsis resolved. Defer long term mx to patient's cardiologist. Discussed plan of care with wife and daughter at bed side. Patient's condition critical. Time spent on critical care management of sepsis is 55 minutes.  Zylon Creamer [phone] 06/26/2011, 7:57 PM

## 2011-06-26 NOTE — ED Notes (Signed)
PT states no pain at this time. Pt states "just weak and wore out". AIDET performed

## 2011-06-26 NOTE — ED Notes (Signed)
PT states unable to eat meal provided

## 2011-06-26 NOTE — ED Provider Notes (Signed)
History     CSN: [id number]  Arrival date & time 06/26/11  1544   First MD Initiated Contact with Patient 06/26/11 1607      Chief Complaint  Patient presents with  . Weakness  . Dehydration    (Consider location/radiation/quality/duration/timing/severity/associated sxs/prior treatment) Patient is a 78 y.o. male presenting with weakness. The history is provided by the patient.  Weakness Primary symptoms do not include headaches, fever, nausea or vomiting. Primary symptoms comment: weakness Episode onset: 2-3 days ago. The episode lasted 3 days. The symptoms are worsening. The neurological symptoms are diffuse. Context: diffuse.  Additional symptoms include weakness.    Past Medical History  Diagnosis Date  . Diabetes mellitus, type 2   . Hyperlipidemia   . Hypertension   . Cryptogenic cirrhosis     Followed by Dr Russella Dar, never biopsied, has been stable.  History of gastric varices.  . Pyloric stenosis     mild EGD 10/26/06  . Inguinal hernia     left  . Iron deficiency anemia   . CHF (congestive heart failure)     diastolic. Echo 5/10 with mild LVH, EF 75%, grade 1 diastolic dysfunction, severe left atrial enlargement, aortic sclerosis  . Bladder cancer 1984    s/p ileal conduit  . Hemorrhoids   . B12 deficiency   . COPD (chronic obstructive pulmonary disease)     (Dr. Meredeth Ide)  . Chronic a-fib     on pradaxa  . History of ileostomy   . History of bladder cancer   . History of pneumonia 2012    Past Surgical History  Procedure Date  . Ileostomy 1984    bladder cancer  . Prostatectomy 1984    bladder cancer  . Penile prosthesis implant 1990    inflatable implant  . US echocardiography 05/1995    EF 60% TR, AI  . Colonoscopy 07/24/2003    Int. hemorrhoids  . Be 08/08/2003  . Colonoscopy 10/26/2006    Int hemorrhoids, 10 yrs  . Esophagogastroduodenoscopy 10/26/2006    Gastric varices, pyloric stenosis  . Bladder removal     s/p urostomy bag    Family  History  Problem Relation Age of Onset  . Hypertension Mother   . Osteoarthritis Mother   . Heart disease Father     MI  . Coronary artery disease Sister   . Diabetes Brother   . Heart disease Brother     MI  . Coronary artery disease Sister   . Colon cancer Neg Hx   . Stroke Other     GPs    History  Substance Use Topics  . Smoking status: Former Smoker -- 1.0 packs/day for 45 years    Types: Cigarettes    Quit date: 06/22/1989  . Smokeless tobacco: Never Used   Comment: Quit 1990  . Alcohol Use: 0.5 oz/week    1 drink(s) per week     beer/ occassionally      Review of Systems  Constitutional: Negative for fever.  HENT: Negative for rhinorrhea, drooling and neck pain.   Eyes: Negative for pain.  Respiratory: Negative for cough and shortness of breath.   Cardiovascular: Negative for chest pain and leg swelling.  Gastrointestinal: Negative for nausea, vomiting, abdominal pain and diarrhea.  Genitourinary: Negative for dysuria and hematuria.  Musculoskeletal: Negative for gait problem.  Skin: Negative for color change.  Neurological: Positive for weakness. Negative for numbness and headaches.  Hematological: Negative for adenopathy.  Psychiatric/Behavioral: Negative for behavioral problems.  All other systems reviewed and are negative.    Allergies  Review of patient's allergies indicates no known allergies.  Home Medications   Current Outpatient Rx  Name Route Sig Dispense Refill  . IPRATROPIUM-ALBUTEROL 18-103 MCG/ACT IN AERO Inhalation Inhale 2 puffs into the lungs every 6 (six) hours as needed. For wheezing    . ASPIRIN 81 MG PO TBEC Oral Take 81 mg by mouth daily.      Marland Kitchen CARVEDILOL 25 MG PO TABS Oral Take 25 mg by mouth 2 (two) times daily with a meal.    . VITAMIN B-12 2500 MCG SL SUBL Sublingual Place 1 tablet under the tongue daily.    Marland Kitchen DABIGATRAN ETEXILATE MESYLATE 150 MG PO CAPS Oral Take 150 mg by mouth every 12 (twelve) hours.    Di Kindle SULFATE  325 (65 FE) MG PO TABS Oral Take 325 mg by mouth every other day.     Marland Kitchen FLUTICASONE PROPIONATE  HFA 110 MCG/ACT IN AERO Inhalation Inhale 2 puffs into the lungs 2 (two) times daily.    . FUROSEMIDE 40 MG PO TABS Oral Take 20 mg by mouth 2 (two) times daily.     Marland Kitchen GLIMEPIRIDE 2 MG PO TABS Oral Take 2 mg by mouth daily before breakfast.    . LOSARTAN POTASSIUM 100 MG PO TABS Oral Take 100 mg by mouth daily.     Marland Kitchen MAGNESIUM 400 MG PO CAPS Oral Take 400 mg by mouth daily.    Marland Kitchen METFORMIN HCL 500 MG PO TABS Oral Take 500 mg by mouth 2 (two) times daily with a meal.     . POTASSIUM CHLORIDE CRYS ER 20 MEQ PO TBCR Oral Take 20 mEq by mouth daily.    Marland Kitchen SIMVASTATIN 20 MG PO TABS Oral Take 20 mg by mouth at bedtime.    Marland Kitchen TIOTROPIUM BROMIDE MONOHYDRATE 18 MCG IN CAPS Inhalation Place 18 mcg into inhaler and inhale daily.    Letta Pate LANCETS MISC  One daily and as needed/ ICD code 250.00     . ONETOUCH ULTRA BLUE VI STRP  TEST BLOOD SUGAR ONCE DAILY AS DIRECTED 100 each 11  . NEBULIZER DEVI Does not apply by Does not apply route. Use as directed       BP 117/66  Pulse 81  Temp(Src) 98.1 F (36.7 C) (Oral)  Resp 20  SpO2 98%  Physical Exam  Constitutional: He is oriented to person, place, and time. He appears well-developed and well-nourished.  HENT:  Head: Normocephalic and atraumatic.  Right Ear: External ear normal.  Left Ear: External ear normal.  Nose: Nose normal.  Mouth/Throat: Oropharynx is clear and moist. No oropharyngeal exudate.  Eyes: Conjunctivae and EOM are normal. Pupils are equal, round, and reactive to light.  Neck: Normal range of motion. Neck supple.  Cardiovascular: Normal rate, regular rhythm, normal heart sounds and intact distal pulses.  Exam reveals no gallop and no friction rub.   No murmur heard. Pulmonary/Chest: Effort normal and breath sounds normal. No respiratory distress. He has no wheezes.  Abdominal: Soft. Bowel sounds are normal. He exhibits no distension.  There is no tenderness.  Musculoskeletal: Normal range of motion. He exhibits no edema and no tenderness.  Neurological: He is alert and oriented to person, place, and time. He has normal strength. No cranial nerve deficit or sensory deficit. Coordination normal.  Skin: Skin is warm and dry.  Psychiatric: He has a normal mood and affect. His behavior is normal.  ED Course  Procedures (including critical care time)   Labs Reviewed  CBC  DIFFERENTIAL  PRO B NATRIURETIC PEPTIDE  COMPREHENSIVE METABOLIC PANEL  PROTIME-INR  TROPONIN I  URINALYSIS, ROUTINE W REFLEX MICROSCOPIC  URINE CULTURE   No results found.   No diagnosis found.   Date: 06/26/2011  Rate: 91  Rhythm: atrial fibrillation  QRS Axis: right  Intervals: normal  ST/T Wave abnormalities: normal  Conduction Disutrbances:none  Narrative Interpretation: No ST or T wave changes cw ischemia  Old EKG Reviewed: unchanged    MDM  4:35 PM 78 y.o. male w hx of chf on 3L O2 at baseline, afib on prodaxa, DM, HTN pw worsening weakness w exertion over the last 2-3 days. Of note pt recently d/c from [place] about 1.5 weeks ago for CHF w/ inc edema. Pt AFVSS here, appears well on exam, normal strength on exam. He denies cp or sob. Will get labs, cxr, NS bolus.   Pt has had persistent hypoglycemia despite po intake and d50 injections. Pt is also in RF. Will admit to hospitalist service.   Clinical Impression 1. Hypoglycemia   2. Renal failure, acute          Purvis Sheffield, MD 06/26/11 [phone]

## 2011-06-26 NOTE — ED Notes (Signed)
CBG resulted :51

## 2011-06-26 NOTE — ED Notes (Signed)
Week.half at [facility] for 35 lbs of fluid removed b/c chf. Been feeling weak since then, cbg 58. Mentating fine, no cp, some sob with ambulation, that resolved with oxygen 2 l. [name]. pcp told pt. To only take 40 mg than 80 mg of lasix several days ago. Orthostatic changes 160/80 - 140/60. piv 20 ga. Rt. Hand. Fluid kvo.

## 2011-06-26 NOTE — ED Notes (Signed)
Asymptomatic with low cbg; pt. Encouraged to eat; dr. Anitra Lauth aware. Re-evaluate in one hour.

## 2011-06-26 NOTE — Progress Notes (Addendum)
ANTIBIOTIC CONSULT NOTE - INITIAL  Pharmacy Consult:  Vancomycin / Zosyn / Cipro Indication:  Sepsis  No Known Allergies  Patient Measurements: Height: 5' 8.11" (173 cm) Weight: 212 lb 1.3 oz (96.2 kg) IBW/kg (Calculated) : 68.65   Vital Signs: Temp: 98.1 F (36.7 C) (06/01 1547) Temp src: Oral (06/01 1547) BP: 101/44 mmHg (06/01 1913) Pulse Rate: 83  (06/01 1912)  Labs:  Basename 06/26/11 1645  WBC 16.2*  HGB 9.5*  PLT 265  LABCREA --  CREATININE 2.65*   Estimated Creatinine Clearance: 26.7 ml/min (by C-G formula based on Cr of 2.65). No results found for this basename: VANCOTROUGH:2,VANCOPEAK:2,VANCORANDOM:2,GENTTROUGH:2,GENTPEAK:2,GENTRANDOM:2,TOBRATROUGH:2,TOBRAPEAK:2,TOBRARND:2,AMIKACINPEAK:2,AMIKACINTROU:2,AMIKACIN:2, in the last 72 hours   Microbiology: No results found for this or any previous visit (from the past 720 hour(s)).  Medical History: Past Medical History  Diagnosis Date  . Diabetes mellitus, type 2   . Hyperlipidemia   . Hypertension   . Cryptogenic cirrhosis     Followed by Dr [name], never biopsied, has been stable.  History of gastric varices.  . Pyloric stenosis     mild EGD 10/26/06  . Inguinal hernia     left  . Iron deficiency anemia   . CHF (congestive heart failure)     diastolic. Echo 5/10 with mild LVH, EF 75%, grade 1 diastolic dysfunction, severe left atrial enlargement, aortic sclerosis  . Bladder cancer 1984    s/p ileal conduit  . Hemorrhoids   . B12 deficiency   . COPD (chronic obstructive pulmonary disease)     (Dr. Meredeth [name])  . Chronic a-fib     on pradaxa  . History of ileostomy   . History of bladder cancer   . History of pneumonia 2012       Assessment: 3 YOM recently discharged from [place] after being treated for CHF with increase edema.  He presents today with weakness and dehydration.  Pharmacy consulted to dose vanc, Cipro and Zosyn for sepsis.  Noted patient in ARF.    Goal of Therapy:  Vancomycin  trough level 15-20 mcg/ml    Plan:  - Vanc 1500mg  IV x 1, then 1gm IV Q24H - Zosyn 3.375gm IV Q8H (4 hr infusion) - Cipro 400mg  IV Q24H - Monitor renal fxn and adjust abx as appropriate - Monitor weight      [name] D. Laney Potash, PharmD, BCPS Pager:  [phone] 06/26/2011, 8:04 PM

## 2011-06-27 ENCOUNTER — Encounter (HOSPITAL_COMMUNITY): Payer: Self-pay | Admitting: *Deleted

## 2011-06-27 DIAGNOSIS — E871 Hypo-osmolality and hyponatremia: Secondary | ICD-10-CM

## 2011-06-27 DIAGNOSIS — K922 Gastrointestinal hemorrhage, unspecified: Secondary | ICD-10-CM

## 2011-06-27 DIAGNOSIS — D684 Acquired coagulation factor deficiency: Secondary | ICD-10-CM

## 2011-06-27 DIAGNOSIS — R0789 Other chest pain: Secondary | ICD-10-CM

## 2011-06-27 LAB — COMPREHENSIVE METABOLIC PANEL
ALT: 14 U/L (ref 0–53)
Alkaline Phosphatase: 63 U/L (ref 39–117)
CO2: 18 mEq/L — ABNORMAL LOW (ref 19–32)
GFR calc Af Amer: 29 mL/min — ABNORMAL LOW (ref >90)
Glucose, Bld: 100 mg/dL — ABNORMAL HIGH (ref 70–99)
Potassium: 5 mEq/L (ref 3.5–5.1)
Sodium: 139 mEq/L (ref 135–145)
Total Protein: 5 g/dL — ABNORMAL LOW (ref 6.0–8.3)

## 2011-06-27 LAB — GLUCOSE, CAPILLARY
Glucose-Capillary: 103 mg/dL — ABNORMAL HIGH (ref 70–99)
Glucose-Capillary: 152 mg/dL — ABNORMAL HIGH (ref 70–99)
Glucose-Capillary: 203 mg/dL — ABNORMAL HIGH (ref 70–99)

## 2011-06-27 LAB — CBC
HCT: 19.4 % — ABNORMAL LOW (ref 39.0–52.0)
Hemoglobin: 7.1 g/dL — ABNORMAL LOW (ref 13.0–17.0)
MCH: 29 pg (ref 26.0–34.0)
MCHC: 33 g/dL (ref 30.0–36.0)
Platelets: 156 10*3/uL (ref 150–400)
RBC: 2.46 MIL/uL — ABNORMAL LOW (ref 4.22–5.81)
RDW: 14.6 % (ref 11.5–15.5)
WBC: 16.2 10*3/uL — ABNORMAL HIGH (ref 4.0–10.5)

## 2011-06-27 LAB — HEMOGLOBIN A1C: Hgb A1c MFr Bld: 5.4 % (ref <5.7)

## 2011-06-27 LAB — PROTIME-INR: INR: 3.03 — ABNORMAL HIGH (ref 0.00–1.49)

## 2011-06-27 LAB — SODIUM, URINE, RANDOM: Sodium, Ur: 45 mEq/L

## 2011-06-27 LAB — CREATININE, URINE, RANDOM: Creatinine, Urine: 67.23 mg/dL

## 2011-06-27 MED ORDER — VITAMIN K1 10 MG/ML IJ SOLN
10.0000 mg | Freq: Every day | INTRAVENOUS | Status: AC
  Administered 2011-06-27 – 2011-06-29 (×3): 10 mg via INTRAVENOUS
  Filled 2011-06-27 (×3): qty 1

## 2011-06-27 MED ORDER — PANTOPRAZOLE SODIUM 40 MG PO TBEC
40.0000 mg | DELAYED_RELEASE_TABLET | Freq: Two times a day (BID) | ORAL | Status: DC
  Administered 2011-06-27: 40 mg via ORAL
  Filled 2011-06-27: qty 1

## 2011-06-27 MED ORDER — SODIUM CHLORIDE 0.9 % IV SOLN
80.0000 mg | Freq: Once | INTRAVENOUS | Status: AC
  Administered 2011-06-27: 80 mg via INTRAVENOUS
  Filled 2011-06-27: qty 80

## 2011-06-27 MED ORDER — PANTOPRAZOLE SODIUM 40 MG IV SOLR
8.0000 mg/h | INTRAVENOUS | Status: DC
  Administered 2011-06-27 – 2011-06-29 (×2): 8 mg/h via INTRAVENOUS
  Filled 2011-06-27 (×6): qty 80

## 2011-06-27 MED ORDER — SODIUM CHLORIDE 0.9 % IV BOLUS (SEPSIS)
500.0000 mL | Freq: Once | INTRAVENOUS | Status: AC
  Administered 2011-06-27: 500 mL via INTRAVENOUS

## 2011-06-27 MED ORDER — SODIUM CHLORIDE 0.9 % IV SOLN
INTRAVENOUS | Status: DC
  Administered 2011-06-27: 150 mL/h via INTRAVENOUS

## 2011-06-27 MED ORDER — FUROSEMIDE 10 MG/ML IJ SOLN
20.0000 mg | Freq: Once | INTRAMUSCULAR | Status: AC
  Administered 2011-06-27: 15:00:00 via INTRAVENOUS

## 2011-06-27 MED ORDER — SODIUM CHLORIDE 0.9 % IV SOLN
50.0000 ug/h | INTRAVENOUS | Status: DC
  Administered 2011-06-27: 50 ug/h via INTRAVENOUS
  Filled 2011-06-27 (×6): qty 1

## 2011-06-27 MED ORDER — HYDROCODONE-ACETAMINOPHEN 5-325 MG PO TABS
1.0000 | ORAL_TABLET | Freq: Four times a day (QID) | ORAL | Status: DC | PRN
  Administered 2011-06-28: 1 via ORAL
  Filled 2011-06-27: qty 46

## 2011-06-27 MED ORDER — FUROSEMIDE 10 MG/ML IJ SOLN
INTRAMUSCULAR | Status: AC
  Filled 2011-06-27: qty 4

## 2011-06-27 MED ORDER — OCTREOTIDE LOAD VIA INFUSION
50.0000 ug | Freq: Once | INTRAVENOUS | Status: DC
  Administered 2011-06-27: 50 ug via INTRAVENOUS
  Filled 2011-06-27: qty 25

## 2011-06-27 NOTE — ED Provider Notes (Signed)
I saw and evaluated the patient, reviewed the resident's note and I agree with the findings and plan. I saw the EKG and agree with residents interpretation. Pt with recently diuresis and now having hypoglycemia and weakness.  No signs of fluid overload on exam and concern for possible new renal failure causing him to no clear his diabetic meds well.  Gwyneth Sprout, MD 06/27/11 1349

## 2011-06-27 NOTE — Progress Notes (Signed)
[facility] hospitalist progress note. Chief complaint. Anemia. History of present illness. This 78 year old male was admitted with sepsis and associated hypotension. An arterial source felt to be likely urinary tract infection. Patient has been noted to have heme it. A. He is on Pradaxa for a history of atrial fib though this was held at admission. His admission the hemoglobin was 9.5. A followup on hemoglobin obtained with a.m. labs finds hemoglobin now down to 6.4. Nursing that indicates that the patient has had a few tarry black stool since admission. Unfortunately a specimen was not held for occult blood at this point. I did the see the patient at bedside to evaluate his stability and to arrange for transfusion. Patient denies any prior history of GI bleed. He states he did have a endoscopy in about 2008 that was unremarkable. He has had no abdominal pain. He states he is taking iron but his stools have been more black and tarry over the past few weeks. Vital signs. Temperature 90.8, pulse 87, respiration 15, blood pressure 92/55. O2 sats 100%. General appearance. Well-developed elderly male in no distress. Alert, oriented, cooperative. Cardiac. Rate and rhythm regular. She does have some peripheral edema at the ankles. No jugular venous distention. Lungs. Reduced midlung to base bilaterally. No distress and stable O2 sats. Abdomen. Soft with positive bowel sounds. No pain, guarding, or rebound tenderness. No masses are appreciable. Impression/plan. Problem #1 transfuse will anemia. Will type and cross for 2 units of packed red blood cells. We'll transfuse each unit over 3 hours. We'll give him 20 mg of Lasix IV between units given the patient's history of congestive heart failure. I have asked for hemoglobin and hematocrit checks every 8 hours until further notice. I have increased her Protonix to 40 mg twice a day. I have requested the stool specimen to be sent for occult blood. Pradaxa has already been  placed on hold due to hematuria. Patient clinically appears stable at this time.

## 2011-06-27 NOTE — Progress Notes (Signed)
[facility] [place] TEAM 1 - Stepdown/ICU TEAM  PCP:  Eustaquio Boyden, MD, MD  Subjective: [phone] admitted with complaints of generalized malaise and dizzinness associated with chills, and poor appetite ongoing for 2 days.  In the ED his BP was in the 70s at presentation, but responded to fluid challenges into the low 100s systolic, occasionally in the 19J. His renal function has taken a turn for the worse, from a baseline creatinine of 1.2 in December 2012 to 2.65 at presentation.  Of note [name] was hospitalized at [facility] a week ago (5/18-21/2013)  for an acute on chronic respiratory failure/chf decompensation. He has continued to take hypoglycemics and BP meds as prescribed, including metformin / pradaxa / amaryl / zocor / losartan / coreg / lasix, which may be contributing to the ARF/hypoglycemia and hypotension.   Since his admit, the pt has been noted to be having melanotic stools.  He has also declared a marked anemia.  He admits to me that he has been having similar dark/black stools for "a few days or more" at home.  He denies cp, sob, n/v, or abdom pain.  He does continue to feel lightheaded when sitting up or attempting to stand.   Objective:  Intake/Output Summary (Last 24 hours) at 06/27/11 1504 Last data filed at 06/27/11 1100  Gross per 24 hour  Intake 948.75 ml  Output    350 ml  Net 598.75 ml   Blood pressure 85/40, pulse 88, temperature 98.2 F (36.8 C), temperature source Oral, resp. rate 28, height 5\' 8"  (1.727 m), weight 98.7 kg (217 lb 9.5 oz), SpO2 100.00%.  CBG (last 3)   Basename 06/27/11 1200 06/27/11 0759 06/26/11 2209  GLUCAP 152* 103* 65*   Physical Exam: General: No acute respiratory distress - rather pale in appearance Lungs: Clear to auscultation bilaterally without wheezes or crackles - distant breath sounds Cardiovascular: irreg irreg w/o gallup or rub - no appreciable M Abdomen: mildly tender diffusely, nondistended, soft,  bowel sounds positive, no rebound, no ascites, no appreciable mass - ileal conduit in R LQ with pink tinged clear urine produced Extremities: trace B LE edema w/o cyanosis or clubbing  Lab Results:  Basename 06/27/11 0455 06/26/11 2051 06/26/11 1645  NA 139 -- 141  K 5.0 -- 4.9  CL 108 -- 104  CO2 18* -- 24  GLUCOSE 100* -- 82  BUN 136* -- 113*  CREATININE 2.37* -- 2.65*  CALCIUM 8.7 -- 9.4  MG -- 2.8* --  PHOS -- 3.8 --    Basename 06/27/11 0455 06/26/11 1645  AST 18 23  ALT 14 18  ALKPHOS 63 87  BILITOT 0.3 0.5  PROT 5.0* 6.4  ALBUMIN 2.9* 3.6    Basename 06/27/11 0455 06/26/11 1645  WBC 10.9* 16.2*  NEUTROABS -- 12.1*  HGB 6.4* 9.5*  HCT 19.4* 29.2*  MCV 87.8 89.3  PLT 206 265    Basename 06/26/11 1645  CKTOTAL --  CKMB --  CKMBINDEX --  TROPONINI <0.30    Basename 06/27/11 0033  HGBA1C 5.4   Micro Results: Recent Results (from the past 240 hour(s))  MRSA PCR SCREENING     Status: Normal   Collection Time   06/27/11  5:52 AM      Component Value Range Status Comment   MRSA by PCR NEGATIVE  NEGATIVE  Final    Studies/Results: All recent x-ray/radiology reports have been reviewed in detail.   Medications: I have reviewed the patient's complete medication  list.  Assessment/Plan:  hypovolemic hypotension Hydrate - increase to aggressive volume resuscitation - transfuse to keep Hgb >7 with room expecting further blood loss  Melena - UGIB - hx of gastric varices Hydrate aggressively - begin protonix gtt (subacute nature argues for possible ulcer/gastritis) - consider octreotide if bleeding continues and BP difficult to manage - will need EGD once more HD stable - will ask GI to see in AM, but sooner if decompensates (clipping/injecting could be required - I spoke w/ GI MD on phone who agrees to see in AM)  Coagulopathy I assume this is due to his cryptogenic cirrhosis - I am not sure if pradaxa could be contributing or not - I find no evidence of a  prior coagulopathy - given that he is actively bleeding, I will transfuse him w/ FFP and load him w/ IV vitamin K as well   Acute blood loss anemia Due to GIB - transfuse to goal Hgb of 10.0 acutely, as further decline likely - baseline Hgb was 11.3 ~1 week ago  UTI (lower urinary tract infection) vs/ colonization (illeal conduit) Cont empiric abx for now  Hematuria - bladder cancer s/p ileal conduit Mild/minimal - likely oozing due to coagulopathy - follow after FFP and Vitamin K  cryptogenic cirrhosis Followed by Dr Russella Dar - history of gastric varices - [facility] contacted to see in AM when more stable - agreed to call them back tonight if pt declines and needs emergent EGD  Hypoglycemia in DM2 Due to meds + ARI - stable for now   AKI (acute kidney injury) Likely pre-renal due to hypotension due to acute anemia - correct these issues and follow trend - discharge creatinine after recent hospitalization was 1.13  A-fib Was on pradaxa prior to admit - is coagulopathic w/ active GIB, so clearly can't cont pradaxa for now  Diastolic CHF - grade 1 Well compensated at present / is dry  O2 dependent COPD Well compensated at present   Dispo Is showing early signs of improvement - is getting transfused now - will keep in SDU for now, but will have low threshold to transfer to ICU if displays evidence of decline  Lonia Blood, MD [facility] Office  [phone] Pager [phone]  On-Call/Text Page:      [email or web address]      password [facility]

## 2011-06-28 ENCOUNTER — Encounter (HOSPITAL_COMMUNITY)
Admission: EM | Disposition: A | Discharge status: Home / Self Care | Payer: Self-pay | Source: Ambulatory Visit | Attending: Internal Medicine

## 2011-06-28 ENCOUNTER — Encounter (HOSPITAL_COMMUNITY): Payer: Self-pay | Admitting: *Deleted

## 2011-06-28 DIAGNOSIS — Z7901 Long term (current) use of anticoagulants: Secondary | ICD-10-CM

## 2011-06-28 DIAGNOSIS — R578 Other shock: Secondary | ICD-10-CM

## 2011-06-28 DIAGNOSIS — K922 Gastrointestinal hemorrhage, unspecified: Secondary | ICD-10-CM

## 2011-06-28 DIAGNOSIS — K31819 Angiodysplasia of stomach and duodenum without bleeding: Secondary | ICD-10-CM

## 2011-06-28 DIAGNOSIS — D684 Acquired coagulation factor deficiency: Secondary | ICD-10-CM

## 2011-06-28 DIAGNOSIS — D62 Acute posthemorrhagic anemia: Secondary | ICD-10-CM

## 2011-06-28 DIAGNOSIS — D509 Iron deficiency anemia, unspecified: Secondary | ICD-10-CM

## 2011-06-28 DIAGNOSIS — K7460 Unspecified cirrhosis of liver: Secondary | ICD-10-CM

## 2011-06-28 DIAGNOSIS — E871 Hypo-osmolality and hyponatremia: Secondary | ICD-10-CM

## 2011-06-28 HISTORY — DX: Angiodysplasia of stomach and duodenum without bleeding: K31.819

## 2011-06-28 HISTORY — PX: ESOPHAGOGASTRODUODENOSCOPY: SHX5428

## 2011-06-28 LAB — PROTIME-INR
INR: 1.82 — ABNORMAL HIGH (ref 0.00–1.49)
Prothrombin Time: 18.8 seconds — ABNORMAL HIGH (ref 11.6–15.2)

## 2011-06-28 LAB — GLUCOSE, CAPILLARY
Glucose-Capillary: 173 mg/dL — ABNORMAL HIGH (ref 70–99)
Glucose-Capillary: 166 mg/dL — ABNORMAL HIGH (ref 70–99)
Glucose-Capillary: 145 mg/dL — ABNORMAL HIGH (ref 70–99)

## 2011-06-28 LAB — CBC
HCT: 18.2 % — ABNORMAL LOW (ref 39.0–52.0)
Hemoglobin: 6.1 g/dL — CL (ref 13.0–17.0)
Hemoglobin: 6.1 g/dL — CL (ref 13.0–17.0)
MCHC: 33.3 g/dL (ref 30.0–36.0)
MCV: 85.4 fL (ref 78.0–100.0)
MCV: 85.3 fL (ref 78.0–100.0)
Platelets: 137 10*3/uL — ABNORMAL LOW (ref 150–400)
RBC: 2.13 MIL/uL — ABNORMAL LOW (ref 4.22–5.81)
RBC: 2.15 MIL/uL — ABNORMAL LOW (ref 4.22–5.81)
RBC: 2.72 MIL/uL — ABNORMAL LOW (ref 4.22–5.81)
WBC: 8.7 10*3/uL (ref 4.0–10.5)
WBC: 8.4 10*3/uL (ref 4.0–10.5)
WBC: 8.3 10*3/uL (ref 4.0–10.5)

## 2011-06-28 LAB — URINE CULTURE

## 2011-06-28 LAB — HEPATIC FUNCTION PANEL
ALT: 16 U/L (ref 0–53)
AST: 18 U/L (ref 0–37)
Albumin: 3 g/dL — ABNORMAL LOW (ref 3.5–5.2)
Bilirubin, Direct: 0.2 mg/dL (ref 0.0–0.3)
Total Protein: 5.2 g/dL — ABNORMAL LOW (ref 6.0–8.3)

## 2011-06-28 LAB — BASIC METABOLIC PANEL
Chloride: 114 mEq/L — ABNORMAL HIGH (ref 96–112)
GFR calc Af Amer: 36 mL/min — ABNORMAL LOW (ref >90)
GFR calc non Af Amer: 31 mL/min — ABNORMAL LOW (ref >90)
Glucose, Bld: 188 mg/dL — ABNORMAL HIGH (ref 70–99)
Potassium: 4.8 mEq/L (ref 3.5–5.1)
Sodium: 145 mEq/L (ref 135–145)

## 2011-06-28 LAB — PREPARE RBC (CROSSMATCH)

## 2011-06-28 LAB — APTT: aPTT: 50 seconds — ABNORMAL HIGH (ref 24–37)

## 2011-06-28 SURGERY — EGD (ESOPHAGOGASTRODUODENOSCOPY)
Anesthesia: Moderate Sedation

## 2011-06-28 MED ORDER — BUTAMBEN-TETRACAINE-BENZOCAINE 2-2-14 % EX AERO
INHALATION_SPRAY | CUTANEOUS | Status: DC | PRN
  Administered 2011-06-28: 1 via TOPICAL

## 2011-06-28 MED ORDER — FUROSEMIDE 10 MG/ML IJ SOLN
20.0000 mg | Freq: Once | INTRAMUSCULAR | Status: AC
  Administered 2011-06-28: 20 mg via INTRAVENOUS

## 2011-06-28 MED ORDER — FENTANYL NICU IV SYRINGE 50 MCG/ML
INJECTION | INTRAMUSCULAR | Status: DC | PRN
  Administered 2011-06-28 (×2): 25 ug via INTRAVENOUS

## 2011-06-28 MED ORDER — ETHANOLAMINE OLEATE 5 % IV SOLN
INTRAVENOUS | Status: AC
  Filled 2011-06-28: qty 2

## 2011-06-28 MED ORDER — DIPHENHYDRAMINE HCL 50 MG/ML IJ SOLN
INTRAMUSCULAR | Status: AC
  Filled 2011-06-28: qty 1

## 2011-06-28 MED ORDER — ALBUTEROL SULFATE HFA 108 (90 BASE) MCG/ACT IN AERS
2.0000 | INHALATION_SPRAY | Freq: Four times a day (QID) | RESPIRATORY_TRACT | Status: DC | PRN
  Filled 2011-06-28: qty 6.7

## 2011-06-28 MED ORDER — FENTANYL CITRATE 0.05 MG/ML IJ SOLN
INTRAMUSCULAR | Status: AC
  Filled 2011-06-28: qty 2

## 2011-06-28 MED ORDER — MIDAZOLAM HCL 10 MG/2ML IJ SOLN
INTRAMUSCULAR | Status: DC | PRN
  Administered 2011-06-28: 2 mg via INTRAVENOUS
  Administered 2011-06-28 (×2): 1 mg via INTRAVENOUS

## 2011-06-28 MED ORDER — FENTANYL CITRATE 0.05 MG/ML IJ SOLN: 12.5000 ug | INTRAMUSCULAR | Status: DC | PRN

## 2011-06-28 MED ORDER — FUROSEMIDE 10 MG/ML IJ SOLN
INTRAMUSCULAR | Status: AC
  Filled 2011-06-28: qty 4

## 2011-06-28 MED ORDER — MIDAZOLAM HCL 10 MG/2ML IJ SOLN
INTRAMUSCULAR | Status: AC
  Filled 2011-06-28: qty 2

## 2011-06-28 NOTE — Op Note (Signed)
[facility] [address] [place], [place]  [address]  ENDOSCOPY PROCEDURE REPORT  PATIENT:  Zaquan, Duffner  MR#:  [id number] BIRTHDATE:  Apr 18, 1933, 78 yrs. old  GENDER:  male ENDOSCOPIST:  Carie Caddy. Gregorey Nabor, MD Referred by:  [facility] PROCEDURE DATE:  06/28/2011 PROCEDURE:  EGD, diagnostic 43235 ASA CLASS:  Class IV INDICATIONS:  anemia, upper G.I. bleeding, history of cirrhosis with portal HTN MEDICATIONS:   Fentanyl 50 mcg IV, Versed 4 mg IV TOPICAL ANESTHETIC:  Cetacaine Spray  DESCRIPTION OF PROCEDURE:   After the risks benefits and alternatives of the procedure were thoroughly explained, informed consent was obtained.  The  endoscope was introduced through the mouth and advanced to the second portion of the duodenum, without limitations.  The instrument was slowly withdrawn as the mucosa was fully examined. <<PROCEDUREIMAGES>>  The esophagus and gastroesophageal junction were completely normal in appearance.  No esophageal varices were seen. 2 columns of isolated gastric varices were found in the fundus.  The gastric varices had not stigmata of recent hemorrhage.  There was an isolated area of scope/suction trauma in the proximal stomach, but not over a gastric varix.  Multiple (5) angioectasias were found in the cardia, gastric body and on the incisura.  No active bleeding or old blood was found in the stomach.  There was mild stenosis pyloric channel with mild resistance to passage with standard adult upper endoscope.  There evidence of mild trauma and mucosal oozing after intubation of the duodenal bulb.  The duodenal bulb was normal in appearance, as was the postbulbar duodenum.    Retroflexed views revealed findings as previously described.    The scope was then withdrawn from the patient and the procedure completed.  COMPLICATIONS:  None  ENDOSCOPIC IMPRESSION: 1) Normal esophagus 2) Gastric varices in the fundus without stigmata of  recent bleeding 3) Multiple small, non-bleeding angioectasias in the cardia, body and incisura. 4) Mild stenosis in the pyloric channel, but able to be traversed with mild resistance 5) Normal duodenum  RECOMMENDATIONS: 1) Continue pRBC transfusion with close monitoring of Hgb/HCT 2) Can discontinue octreotide, but would continue PPI gtt until tomorrow. 3) Monitor coags closely. 4) Consider repeat EGD once Hgb and INR/coagulation profile stable for APC of angioectasias. 5) Given isolated gastric varices, would rule out splenic vein thrombosis.  Carie Caddy. Elridge Stemm, MD  CC:  The Patient  n. eSIGNED:   Carie Caddy. Waqas Bruhl at 06/28/2011 04:55 PM  Baxter Kail, [phone]

## 2011-06-28 NOTE — Progress Notes (Signed)
Spoke with Brett Canales, PA, about PRBC for patient, They wanted them ran at 200 cc an hour to try and get 2 units of PRBC and 1 unit of FFP in patient before GI procedure. Will monitor pt for fluid overload and give 20 mg IV lasix 1 hour after starting 1st unit of PRBC per Brett Canales, PA.

## 2011-06-28 NOTE — Consult Note (Signed)
Patient seen, examined, and I agree with the above documentation, including the assessment and plan. Pt with hx of cryptogenic cirrhosis with hx of gastric varices, more recent hospitalization for CHF who presents with anemia and concern of GI bleeding. Also with hematuria.  Need to r/o UGI bleeding, esp in the setting of portal HTN. He received 3 u pRBC yesterday and again this am Hgb was 6.1.  Has received 2 additional units (2nd unit today finishing now).  INR 1.8 this am after Vit K and FFP yesterday.  Getting addition FFP now targeting INR < 1.5 On octreotide and pantoprazole gtts Already on abx for bronchitis, and he would need abx also for GI bleeding in the setting of cirrhosis. He takes Pradaxa, but it has been off since admission, which is 48 hours at least. Higher than baseline risk for EGD.The nature of the procedure, as well as the risks, benefits, and alternatives were carefully and thoroughly reviewed with the patient. Ample time for discussion and questions allowed. The patient understood, was satisfied, and agreed to proceed.  Further recommendations after EGD

## 2011-06-28 NOTE — Progress Notes (Signed)
[facility] [place] TEAM 1 - Stepdown/ICU TEAM  PCP:  Eustaquio Boyden, MD, MD  Subjective: [phone] admitted with complaints of generalized malaise and dizzinness associated with chills, and poor appetite ongoing for 2 days.  In the ED his BP was in the 70s at presentation, but responded to fluid challenges into the low 100s systolic, occasionally in the 96E. His renal function has taken a turn for the worse, from a baseline creatinine of 1.2 in December 2012 to 2.65 at presentation.  Of note [name] was hospitalized at [facility] a week ago (5/18-21/2013)  for an acute on chronic respiratory failure/chf decompensation. He has continued to take hypoglycemics and BP meds as prescribed, including metformin / pradaxa / amaryl / zocor / losartan / coreg / lasix, which may be contributing to the ARF/hypoglycemia and hypotension.   Since his admit, the pt has been noted to be having melanotic stools.  He has also declared a marked anemia.  He admits to me that he has been having similar dark/black stools for "a few days or more" at home.    Today he reports that he feels "a little bit stronger."  He says that he has not had any more melanotic stools.  He denies cp, sob, n/v, or abdom pain.     Objective:  Intake/Output Summary (Last 24 hours) at 06/28/11 1704 Last data filed at 06/28/11 1648  Gross per 24 hour  Intake   7190 ml  Output   5100 ml  Net   2090 ml   Blood pressure 96/51, pulse 86, temperature 98.6 F (37 C), temperature source Oral, resp. rate 19, height 5\' 8"  (1.727 m), weight 98.7 kg (217 lb 9.5 oz), SpO2 89.00%.  CBG (last 3)   Basename 06/28/11 1225 06/28/11 0809 06/27/11 2204  GLUCAP 173* 187* 203*   Physical Exam: General: No acute respiratory distress - pale in appearance Lungs: Clear to auscultation bilaterally without wheezes or crackles - distant breath sounds Cardiovascular: irreg irreg w/o gallup or rub - no appreciable M Abdomen: mildly tender  diffusely, nondistended, soft, bowel sounds positive, no rebound, no ascites, no appreciable mass - ileal conduit in R LQ with pink tinged clear urine produced Extremities: trace B LE edema w/o cyanosis or clubbing  Lab Results:  Basename 06/28/11 0930 06/27/11 0455 06/26/11 2051 06/26/11 1645  NA 145 139 -- 141  K 4.8 5.0 -- 4.9  CL 114* 108 -- 104  CO2 21 18* -- 24  GLUCOSE 188* 100* -- 82  BUN 112* 136* -- 113*  CREATININE 1.97* 2.37* -- 2.65*  CALCIUM 8.3* 8.7 -- 9.4  MG -- -- 2.8* --  PHOS 4.6 -- 3.8 --    Basename 06/28/11 0930 06/27/11 0455  AST 18 18  ALT 16 14  ALKPHOS 55 63  BILITOT 0.8 0.3  PROT 5.2* 5.0*  ALBUMIN 3.0* 2.9*    Basename 06/28/11 0930 06/28/11 0545 06/27/11 1853 06/26/11 1645  WBC 8.4 8.7 16.2* --  NEUTROABS -- -- -- 12.1*  HGB 6.1* 6.1* 7.1* --  HCT 18.3* 18.2* 21.2* --  MCV 85.1 85.4 86.2 --  PLT 121* 128* 156 --    Basename 06/26/11 1645  CKTOTAL --  CKMB --  CKMBINDEX --  TROPONINI <0.30    Basename 06/27/11 0033  HGBA1C 5.4   Micro Results: Recent Results (from the past 240 hour(s))  URINE CULTURE     Status: Normal   Collection Time   06/26/11  4:52 PM  Component Value Range Status Comment   Specimen Description URINE, CLEAN CATCH   Final    Special Requests NONE   Final    Culture  Setup Time [id number]   Final    Colony Count >=100,000 COLONIES/ML   Final    Culture     Final    Value: Multiple bacterial morphotypes present, none predominant. Suggest appropriate recollection if clinically indicated.   Report Status 06/28/2011 FINAL   Final   CULTURE, BLOOD (ROUTINE X 2)     Status: Normal (Preliminary result)   Collection Time   06/26/11  7:53 PM      Component Value Range Status Comment   Specimen Description BLOOD LEFT ARM   Final    Special Requests BOTTLES DRAWN AEROBIC AND ANAEROBIC 10CC   Final    Culture  Setup Time [id number]   Final    Culture     Final    Value:        BLOOD CULTURE RECEIVED NO GROWTH  TO DATE CULTURE WILL BE HELD FOR 5 DAYS BEFORE ISSUING A FINAL NEGATIVE REPORT   Report Status PENDING   Incomplete   CULTURE, BLOOD (ROUTINE X 2)     Status: Normal (Preliminary result)   Collection Time   06/26/11  8:02 PM      Component Value Range Status Comment   Specimen Description BLOOD LEFT HAND   Final    Special Requests BOTTLES DRAWN AEROBIC AND ANAEROBIC 10CC   Final    Culture  Setup Time [id number]   Final    Culture     Final    Value:        BLOOD CULTURE RECEIVED NO GROWTH TO DATE CULTURE WILL BE HELD FOR 5 DAYS BEFORE ISSUING A FINAL NEGATIVE REPORT   Report Status PENDING   Incomplete   MRSA PCR SCREENING     Status: Normal   Collection Time   06/27/11  5:52 AM      Component Value Range Status Comment   MRSA by PCR NEGATIVE  NEGATIVE  Final    Studies/Results: All recent x-ray/radiology reports have been reviewed in detail.   Medications: I have reviewed the patient's complete medication list.  Assessment/Plan:  hypovolemic hypotension Cont aggressive volume resuscitation - transfuse to keep Hgb >7+ expecting further blood loss  Melena - UGIB - hx of gastric varices Cont protonix gtt (subacute nature argues for possible ulcer/gastritis) - GI following - see EGD report - in absence of evidence to suggest varicele bleed, I will d/c octreotide   Coagulopathy I assume this is due to his cryptogenic cirrhosis - pradaxa clearly complicating bleeding, but its signif impact on PT/PTT are not clear to me - I find no evidence of a prior coagulopathy - he has been transfused with both  FFP as well as IV vitamin K - will recheck coags with next CBC and consider more FFP   Acute blood loss anemia Due to GIB - transfuse to goal Hgb of 10.0 acutely, as further decline likely - baseline Hgb was 11.3 ~1 week ago  UTI (lower urinary tract infection) vs/ colonization (illeal conduit) Cont empiric abx for now - I suspect this is a chronic colonization issue  Hematuria -  bladder cancer s/p ileal conduit Mild/minimal - likely oozing due to coagulopathy - cont to follow  cryptogenic cirrhosis Followed by Dr Russella Dar - history of gastric varices but no evidence of bleeding varices on EGD  Hypoglycemia in DM2 Due to  meds + ARI - stable for now   AKI (acute kidney injury) Likely pre-renal due to hypotension due to acute anemia - correct these issues and follow trend - discharge creatinine after recent hospitalization was 1.13 - improving   A-fib Was on pradaxa prior to admit - is coagulopathic w/ active GIB, so clearly can't cont pradaxa for now - may not be safe to resume pradaxa at d/c - will cont to monitor coags and GI situation   Diastolic CHF - grade 1 Well compensated at present / is dry  O2 dependent COPD Well compensated at present   Dispo Remain in SDU  Lonia Blood, MD [facility] Office  [phone] Pager [phone]  On-Call/Text Page:      [email or web address]      password [facility]

## 2011-06-28 NOTE — Consult Note (Signed)
[facility] Consult: 8:24 AM 06/28/2011   Referring Provider: Reather Littler  Primary Care Physician:  Eustaquio Boyden, MD, MD Primary Gastroenterologist:  Dr. Russella Dar   Reason for Consultation:  Anemia, heme poistive stool  HPI: [name] is a 78 y.o. male.  Hx cryptogenic cirrhosis, gastric varices and pyloric stenosis on EGD in 2008.  Insulin requiring type 2 DM.  S/P remote Ileostomy due to bladder cancer. Takes 81 mg ASA, Pradaxa.  Admitted at [facility] 5/18 - 5/21 for CHF, respiratory compromise. At discharge he was on increased dose Lasix, Lisinopril, Zithromax, Flovent.  Has lost 30# since that admission and breathing has remained stable.   One week of dark stools, formed to soft consistency. He had stopped using po Iron in recent weeks.    C/O fatigue, dizzyness, anorexia for about 2 to 3 days. Also having grossly bloody urine.  No chest pain, no syncope.  Sxs better with rest.  In ED 5/29 hgb 11.9.  Dropped to 9.5 on 6/1, to 6.4 6/2.  Got 3 units blood, rose to 7.1, down to 6.1 this AM , MCV 86. On 6/2 Hgb dropped to 6.4.  Pt has received PRBCs.    Past Medical History  Diagnosis Date  . Diabetes mellitus, type 2   . Hyperlipidemia   . Hypertension   . Cryptogenic cirrhosis     Followed by Dr Russella Dar, never biopsied, has been stable.  History of gastric varices.  . Pyloric stenosis     mild EGD 10/26/06  . Inguinal hernia     left  . Iron deficiency anemia   . CHF (congestive heart failure)     diastolic. Echo 5/10 with mild LVH, EF 75%, grade 1 diastolic dysfunction, severe left atrial enlargement, aortic sclerosis  . Bladder cancer 1984    s/p ileal conduit  . Hemorrhoids   . B12 deficiency   . COPD (chronic obstructive pulmonary disease)     (Dr. Meredeth Ide)  . Chronic a-fib     on pradaxa  . History of ileostomy   . History of bladder cancer   . History of pneumonia 2012  . Shortness of breath     Past  Surgical History  Procedure Date  . Ileostomy 1984    bladder cancer  . Prostatectomy 1984    bladder cancer  . Penile prosthesis implant 1990    inflatable implant  . US echocardiography 05/1995    EF 60% TR, AI  . Colonoscopy 07/24/2003    Int. hemorrhoids  . Be 08/08/2003  . Colonoscopy 10/26/2006    Int hemorrhoids, 10 yrs  . Esophagogastroduodenoscopy 10/26/2006    Gastric varices, pyloric stenosis  . Bladder removal     s/p urostomy bag    Prior to Admission medications   Medication Sig Start Date End Date Taking? Authorizing Provider  albuterol-ipratropium (COMBIVENT) 18-103 MCG/ACT inhaler Inhale 2 puffs into the lungs every 6 (six) hours as needed. For wheezing   Yes Historical Provider, MD  aspirin 81 MG EC tablet Take 81 mg by mouth daily.     Yes Historical Provider, MD  carvedilol (COREG) 25 MG tablet Take 25 mg by mouth 2 (two) times daily with a meal.   Yes Historical Provider, MD  Cyanocobalamin (VITAMIN B-12) 2500 MCG SUBL Place 1 tablet under the tongue daily.   Yes Historical Provider, MD  dabigatran (PRADAXA) 150 MG CAPS Take 150 mg by mouth every 12 (twelve) hours. 03/30/11  Yes Antonieta Iba, MD  ferrous  sulfate (FERRO-BOB) 325 (65 FE) MG tablet Take 325 mg by mouth every other day.    Yes Historical Provider, MD  fluticasone (FLOVENT HFA) 110 MCG/ACT inhaler Inhale 2 puffs into the lungs 2 (two) times daily.   Yes Historical Provider, MD  furosemide (LASIX) 40 MG tablet Take 20 mg by mouth 2 (two) times daily.    Yes Historical Provider, MD  glimepiride (AMARYL) 2 MG tablet Take 2 mg by mouth daily before breakfast.   Yes Historical Provider, MD  losartan (COZAAR) 100 MG tablet Take 100 mg by mouth daily.  02/13/11  Yes Eustaquio Boyden, MD  Magnesium 400 MG CAPS Take 400 mg by mouth daily.   Yes Historical Provider, MD  metFORMIN (GLUCOPHAGE) 500 MG tablet Take 500 mg by mouth 2 (two) times daily with a meal.  01/15/11  Yes Eustaquio Boyden, MD  potassium  chloride SA (K-DUR,KLOR-CON) 20 MEQ tablet Take 20 mEq by mouth daily.   Yes Historical Provider, MD  simvastatin (ZOCOR) 20 MG tablet Take 20 mg by mouth at bedtime.   Yes Historical Provider, MD  tiotropium (SPIRIVA) 18 MCG inhalation capsule Place 18 mcg into inhaler and inhale daily.   Yes Historical Provider, MD  ONE TOUCH LANCETS MISC One daily and as needed/ ICD code 250.00     Historical Provider, MD  ONE TOUCH ULTRA TEST test strip TEST BLOOD SUGAR ONCE DAILY AS DIRECTED 02/27/11   Eustaquio Boyden, MD  Respiratory Therapy Supplies (NEBULIZER) DEVI by Does not apply route. Use as directed     Historical Provider, MD    Scheduled Meds:   . albuterol  2.5 mg Nebulization Q6H  . ciprofloxacin  400 mg Intravenous Q24H  . fluticasone  2 puff Inhalation BID  . furosemide      . furosemide  20 mg Intravenous Once  . furosemide  20 mg Intravenous Once  . insulin aspart  0-9 Units Subcutaneous TID WC  . ipratropium  0.5 mg Nebulization Q6H  . octreotide  50 mcg Intravenous Once  . pantoprazole (PROTONIX) IV  80 mg Intravenous Once  . phytonadione (VITAMIN K) IV  10 mg Intravenous Daily  . sodium chloride  500 mL Intravenous Once  . tiotropium  18 mcg Inhalation Daily    Infusions:  . sodium chloride 150 mL/hr (06/27/11 1743)  . octreotide (SANDOSTATIN) infusion 50 mcg/hr (06/27/11 1848)  . pantoprozole (PROTONIX) infusion 8 mg/hr (06/27/11 1848)  ] PRN Meds: acetaminophen, albuterol, HYDROcodone-acetaminophen, ondansetron (ZOFRAN) IV, DISCONTD: acetaminophen, DISCONTD: ondansetron   Allergies as of 06/26/2011  . (No Known Allergies)    Family History  Problem Relation Age of Onset  . Hypertension Mother   . Osteoarthritis Mother   . Heart disease Father     MI  . Coronary artery disease Sister   . Diabetes Brother   . Heart disease Brother     MI  . Coronary artery disease Sister   . Colon cancer Neg Hx   . Stroke Other     GPs    History   Social History  .  Marital Status: Married    Spouse Name: N/A    Number of Children: 7  . Years of Education: N/A   Occupational History  . trucking, retired    Social History Main Topics  . Smoking status: Former Smoker -- 1.0 packs/day for 45 years    Types: Cigarettes    Quit date: 06/22/1989  . Smokeless tobacco: Never Used   Comment: Quit 1990  .  Alcohol Use: 0.5 oz/week    1 drink(s) per week     beer/ occassionally  . Drug Use: No  . Sexually Active: Not Currently   Other Topics Concern  . Not on file   Social History Narrative   Lives with wife.  7 childrenDaily caffeine use    REVIEW OF SYSTEMS: Constitutional:  As per HPI ENT:  No nose bleeds Pulm:  Breathing stable, no cough CV:  No palps or shest pain GU:  Per HPI. Does not see urologists.  GI:  Per HPI Heme:  Stopped Iron a few weeks ago.    Transfusions:  None before now Neuro:  No headache or seizure MS:  Occasional pain in right great toe.  No prior dx of gout Derm:  No rash, sores, pruritus Endocrine:  No excessive thirst.  Sugars at home run 90s to low 100s fasting Immunization: not queried Travel:  None recently.  No sick contacts.   PHYSICAL EXAM: Vital signs in last 24 hours: Temp:  [97.4 F (36.3 C)-98.5 F (36.9 C)] 98 F (36.7 C) (06/03 0810) Pulse Rate:  [84-137] 85  (06/03 0450) Resp:  [16-28] 17  (06/03 0450) BP: (70-104)/(30-55) 100/39 mmHg (06/03 0630) SpO2:  [97 %-100 %] 99 % (06/03 0436)  General: Obese, unwell appearing WM Head:  normocephallic , atraumatic  Eyes:  No icterus or pallor Ears:  Not HOH  Nose:  No discharge or congestion Mouth:  No blood in mouth, mm moist and clear Neck:  No mass, JVD.   Lungs:  Clear B.  No cough Heart: RRR, no MRG.  S1 S2. Abdomen:  Soft, obese, NT.  No HSM or mass.  Ileostomy on right Rectal: scant brown, soft stool is dark and FOB positive  GU:  Pink urine.  Musc/Skeltl: no joint swelling Extremities:  No pedal edema.  2 plus pedal pulses    Neurologic:  No tremor.  Moves all 4s.  Fully oriented.  No confusion Skin:  No spider angiomata Tattoos:  none   Psych:  Pleasant, cooperative, affect subdued  Intake/Output from previous day: 06/02 0701 - 06/03 0700 In: 6388.8 [I.V.:3498.8; Blood:2340; IV Piggyback:550] Out: 4050 [Urine:4050] Intake/Output this shift: Total I/O In: -  Out: 1400 [Urine:1400]  LAB RESULTS:    Ref. Range 06/23/2011 12:32 06/26/2011 16:45 06/27/2011 04:55 06/27/2011 18:53 06/28/2011 05:45  WBC Latest Range: 4.0-10.5 K/uL 10.0 16.2 (H) 10.9 (H) 16.2 (H) 8.7  RBC Latest Range: 4.22-5.81 MIL/uL 4.10 (L) 3.27 (L) 2.21 (L) 2.46 (L) 2.13 (L)  Hemoglobin Latest Range: 13.0-17.0 g/dL 16.1 (L) 9.5 (L) 6.4 (LL) 7.1 (L) 6.1 (LL)  HCT Latest Range: 39.0-52.0 % 36.9 (L) 29.2 (L) 19.4 (L) 21.2 (L) 18.2 (L)  MCV Latest Range: 78.0-100.0 fL 90.1 89.3 87.8 86.2 85.4  MCH Latest Range: 26.0-34.0 pg  29.1 29.0 28.9 28.6  MCHC Latest Range: 30.0-36.0 g/dL 09.6 04.5 40.9 81.1 91.4  RDW Latest Range: 11.5-15.5 % 14.9 (H) 14.3 14.6 16.9 (H) 17.0 (H)  Platelets Latest Range: 150-400 K/uL 220.0 265 206 156 128 (L)   BMET Lab Results  Component Value Date   NA 139 06/27/2011   NA 141 06/26/2011   NA 143 06/23/2011   K 5.0 06/27/2011   K 4.9 06/26/2011   K 4.2 06/23/2011   CL 108 06/27/2011   CL 104 06/26/2011   CL 103 06/23/2011   CO2 18* 06/27/2011   CO2 24 06/26/2011   CO2 31 06/23/2011   GLUCOSE 100* 06/27/2011  GLUCOSE 82 06/26/2011   GLUCOSE 110* 06/23/2011   BUN 136* 06/27/2011   BUN 113* 06/26/2011   BUN 40* 06/23/2011   CREATININE 2.37* 06/27/2011   CREATININE 2.65* 06/26/2011   CREATININE 1.7* 06/23/2011   CALCIUM 8.7 06/27/2011   CALCIUM 9.4 06/26/2011   CALCIUM 9.7 06/23/2011   LFT  Basename 06/27/11 0455 06/26/11 1645  PROT 5.0* 6.4  ALBUMIN 2.9* 3.6  AST 18 23  ALT 14 18  ALKPHOS 63 87  BILITOT 0.3 0.5  BILIDIR -- --  IBILI -- --   PT/INR Lab Results  Component Value Date   INR 3.03* 06/27/2011   INR 2.96* 06/26/2011   INR  1.2 07/02/2008     Ref. Range 06/26/2011 16:51  Color, Urine Latest Range: YELLOW  YELLOW  APPearance Latest Range: CLEAR  CLOUDY (A)  Specific Gravity, Urine Latest Range: 1.005-1.030  1.012  pH Latest Range: 5.0-8.0  7.0  Glucose, UA Latest Range: NEGATIVE mg/dL NEGATIVE  Bilirubin Urine Latest Range: NEGATIVE  NEGATIVE  Ketones, ur Latest Range: NEGATIVE mg/dL NEGATIVE  Protein Latest Range: NEGATIVE mg/dL NEGATIVE  Urobilinogen, UA Latest Range: 0.0-1.0 mg/dL 0.2  Nitrite Latest Range: NEGATIVE  NEGATIVE  Leukocytes, UA Latest Range: NEGATIVE  SMALL (A)  Hgb urine dipstick Latest Range: NEGATIVE  LARGE (A)  WBC, UA Latest Range: <3 WBC/hpf 3-6  RBC / HPF Latest Range: <3 RBC/hpf 11-20     RADIOLOGY STUDIES: Dg Chest 2 View 06/26/2011  *RADIOLOGY REPORT*  Clinical Data: Nonproductive cough for the past 2 months.  Ex- smoker.  CHEST - 2 VIEW  Comparison: None.  Findings: Borderline enlarged cardiac silhouette.  Small amount of linear density at the right lung base.  Otherwise, clear lungs. The lungs are hyperexpanded with diffuse peribronchial thickening and mild prominence of the interstitial markings.  Diffuse osteopenia.  Mild thoracic spine degenerative changes.  IMPRESSION:  1.  Mild right basilar linear atelectasis or scarring. 2.  Changes of COPD and chronic bronchitis. 3.  Borderline cardiomegaly.  Original Report Authenticated By: Darrol Angel, M.D.   US Renal 06/26/2011  *RADIOLOGY REPORT*  Clinical Data: Acute renal failure.  Hematuria.  Post cystectomy for cancer.  RENAL/URINARY TRACT ULTRASOUND COMPLETE  Comparison:  CT abdomen and pelvis 10/28/2006  Findings:  Right Kidney:  The right kidney measures 11.3 cm length.  There is a cyst in the midpole of the kidney measuring 4.7 x 4 x 4.7 cm. This appears to be a simple cyst and was present on the previous CT scan.  No solid mass or hydronephrosis appreciated.  Mild parenchymal atrophy.  Left Kidney:  The left kidney measures 12.2 cm  length.  No focal mass or hydronephrosis.  Mild parenchymal atrophy.  Bladder:  The bladder is surgically absent and the area was not evaluated.  IMPRESSION: Mild parenchymal atrophy in both kidneys.  No evidence of hydronephrosis.  Simple appearing cyst on the right kidney.  Original Report Authenticated By: Marlon Pel, M.D.    ENDOSCOPIC STUDIES: 10/2006  EGD for Heme + anemia Normal: Proximal Esophagus to Cardia.  VARICES: Proximal level Fundus. 2 varices found. maximum size: Grade  II. Varices show no stigmata of recent bleed. ICD9: Varices.Comment:  suspected gastric varices.  STRICTURE / STENOSIS: Stenosis in Pyloric Sphincter. Constriction:  partial. Comment: stenosis - resistance to passage of the endoscope  but with firm pressure the scope advanced.  Normal: Duodenal Bulb.  Normal: Antrum to Body.  Normal: Duodenal 2nd Portion. Biopsy/Normal taken. Comments: R/O  celiac disease.  SMALL BOWEL BIOPSY: BENIGN SMALL BOWEL MUCOSA. NO VILLOUS ATROPHY, INFLAMMATION OR OTHER ABNORMALITIES PRESENT. (BIOPSIES, DUODENUM)  10/2006   Colonoscopy  IMPRESSION: *  GI bleed R/O portal htn, r/o PUD, r/o varices On empiric octreotide and Protonix infusion. *  Gross hematuria *  ABL anemia from gi tract and hematuria.  S/p 3 units PRBCs, despite this H & H still dropping. .  *  Coagulopathy. S/p 2 units FFP yesterday, one this AM.  On VIT K 10 mg IV x 3 days, started 6/2. *  Acute renal failure. Recent baseline levels unknown *  Bronchitis, on Zosyn.  No fevers *  Type 2 DM.  *  Diastolic CHF, 30 # wt loss since meds adjusted during 5/18-5/21 hospitalization   PLAN: *  coags now *  Transfuse the 2 units PRBCs ordered by hospitalist.  *  If coags corrected, proceed to EGD today.   LOS: 2 days   Jennye Moccasin  06/28/2011, 8:24 AM Pager: [phone]

## 2011-06-28 NOTE — Progress Notes (Signed)
Utilization review completed.  

## 2011-06-29 DIAGNOSIS — K922 Gastrointestinal hemorrhage, unspecified: Secondary | ICD-10-CM

## 2011-06-29 DIAGNOSIS — E871 Hypo-osmolality and hyponatremia: Secondary | ICD-10-CM

## 2011-06-29 DIAGNOSIS — I4891 Unspecified atrial fibrillation: Secondary | ICD-10-CM

## 2011-06-29 DIAGNOSIS — D684 Acquired coagulation factor deficiency: Secondary | ICD-10-CM

## 2011-06-29 DIAGNOSIS — K31819 Angiodysplasia of stomach and duodenum without bleeding: Secondary | ICD-10-CM

## 2011-06-29 LAB — CBC
Hemoglobin: 7.5 g/dL — ABNORMAL LOW (ref 13.0–17.0)
MCH: 28.7 pg (ref 26.0–34.0)
MCH: 29.5 pg (ref 26.0–34.0)
MCHC: 33 g/dL (ref 30.0–36.0)
MCHC: 33.5 g/dL (ref 30.0–36.0)
MCV: 87 fL (ref 78.0–100.0)
MCV: 87.6 fL (ref 78.0–100.0)
Platelets: 121 10*3/uL — ABNORMAL LOW (ref 150–400)
Platelets: 120 10*3/uL — ABNORMAL LOW (ref 150–400)
RBC: 2.51 MIL/uL — ABNORMAL LOW (ref 4.22–5.81)
RDW: 16.7 % — ABNORMAL HIGH (ref 11.5–15.5)
RDW: 16.2 % — ABNORMAL HIGH (ref 11.5–15.5)
WBC: 6.8 10*3/uL (ref 4.0–10.5)

## 2011-06-29 LAB — GLUCOSE, CAPILLARY
Glucose-Capillary: 147 mg/dL — ABNORMAL HIGH (ref 70–99)
Glucose-Capillary: 153 mg/dL — ABNORMAL HIGH (ref 70–99)
Glucose-Capillary: 113 mg/dL — ABNORMAL HIGH (ref 70–99)

## 2011-06-29 LAB — PREPARE FRESH FROZEN PLASMA
Unit division: 0
Unit division: 0
Unit division: 0

## 2011-06-29 LAB — PREPARE RBC (CROSSMATCH)

## 2011-06-29 LAB — COMPREHENSIVE METABOLIC PANEL WITH GFR
ALT: 17 U/L (ref 0–53)
AST: 24 U/L (ref 0–37)
Albumin: 3.4 g/dL — ABNORMAL LOW (ref 3.5–5.2)
Alkaline Phosphatase: 60 U/L (ref 39–117)
BUN: 63 mg/dL — ABNORMAL HIGH (ref 6–23)
CO2: 24 meq/L (ref 19–32)
Calcium: 8.6 mg/dL (ref 8.4–10.5)
Chloride: 119 meq/L — ABNORMAL HIGH (ref 96–112)
Creatinine, Ser: 1.39 mg/dL — ABNORMAL HIGH (ref 0.50–1.35)
GFR calc Af Amer: 55 mL/min — ABNORMAL LOW
GFR calc non Af Amer: 48 mL/min — ABNORMAL LOW
Glucose, Bld: 142 mg/dL — ABNORMAL HIGH (ref 70–99)
Potassium: 4.1 meq/L (ref 3.5–5.1)
Sodium: 152 meq/L — ABNORMAL HIGH (ref 135–145)
Total Bilirubin: 0.6 mg/dL (ref 0.3–1.2)
Total Protein: 5.8 g/dL — ABNORMAL LOW (ref 6.0–8.3)

## 2011-06-29 LAB — PROTIME-INR
INR: 1.52 — ABNORMAL HIGH (ref 0.00–1.49)
Prothrombin Time: 18.6 s — ABNORMAL HIGH (ref 11.6–15.2)

## 2011-06-29 MED ORDER — DEXTROSE 5 % IV SOLN
INTRAVENOUS | Status: DC
  Administered 2011-06-29: 100 mL via INTRAVENOUS
  Administered 2011-06-30: 12:00:00 via INTRAVENOUS

## 2011-06-29 MED ORDER — PANTOPRAZOLE SODIUM 40 MG PO TBEC
40.0000 mg | DELAYED_RELEASE_TABLET | Freq: Two times a day (BID) | ORAL | Status: DC
  Administered 2011-06-29 – 2011-07-01 (×5): 40 mg via ORAL
  Filled 2011-06-29 (×5): qty 1

## 2011-06-29 NOTE — Progress Notes (Signed)
[facility] TEAM 1 - Stepdown/ICU TEAM  PCP:  Eustaquio Boyden, MD, MD  Subjective: [phone] admitted with complaints of generalized malaise and dizzinness associated with chills, and poor appetite ongoing for 2 days.  In the ED his BP was in the 70s at presentation, but responded to fluid challenges into the low 100s systolic, occasionally in the 96E. His renal function has taken a turn for the worse, from a baseline creatinine of 1.2 in December 2012 to 2.65 at presentation.  Of note [name] was hospitalized at [facility] a week ago (5/18-21/2013)  for an acute on chronic respiratory failure/chf decompensation. He has continued to take hypoglycemics and BP meds as prescribed, including metformin / pradaxa / amaryl / zocor / losartan / coreg / lasix, which may be contributing to the ARF/hypoglycemia and hypotension.   Since his admit, the pt has been noted to be having melanotic stools.  He has also declared a marked anemia.  He admits to me that he has been having similar dark/black stools for "a few days or more" at home.    He states he feels well and thinks he has stopped bleeding. His last BM was at 6 M and "runny".  No other complaints.    Objective:  Intake/Output Summary (Last 24 hours) at 06/29/11 1432 Last data filed at 06/29/11 1204  Gross per 24 hour  Intake   1195 ml  Output   6350 ml  Net  -5155 ml   Blood pressure 111/53, pulse 84, temperature 97.8 F (36.6 C), temperature source Oral, resp. rate 19, height 5\' 8"  (1.727 m), weight 98.7 kg (217 lb 9.5 oz), SpO2 97.00%.  CBG (last 3)   Basename 06/29/11 1157 06/29/11 0718 06/28/11 2203  GLUCAP 160* 147* 145*   Physical Exam: General: No acute respiratory distress - pale in appearance Lungs: Clear to auscultation bilaterally without wheezes or crackles - distant breath sounds Cardiovascular: irreg irreg w/o gallup or rub - no appreciable M Abdomen: mildly tender diffusely, nondistended, soft,  bowel sounds positive, no rebound, no ascites, no appreciable mass - ileal conduit in R LQ with pink tinged clear urine produced Extremities: trace B LE edema w/o cyanosis or clubbing  Lab Results:  Basename 06/29/11 0440 06/28/11 0930 06/27/11 0455 06/26/11 2051  NA 152* 145 139 --  K 4.1 4.8 5.0 --  CL 119* 114* 108 --  CO2 24 21 18* --  GLUCOSE 142* 188* 100* --  BUN 63* 112* 136* --  CREATININE 1.39* 1.97* 2.37* --  CALCIUM 8.6 8.3* 8.7 --  MG -- -- -- 2.8*  PHOS -- 4.6 -- 3.8    Basename 06/29/11 0440 06/28/11 0930  AST 24 18  ALT 17 16  ALKPHOS 60 55  BILITOT 0.6 0.8  PROT 5.8* 5.2*  ALBUMIN 3.4* 3.0*    Basename 06/29/11 0440 06/28/11 2115 06/28/11 0930 06/26/11 1645  WBC 6.5 8.3 8.4 --  NEUTROABS -- -- -- 12.1*  HGB 7.5* 7.9* 6.1* --  HCT 22.7* 23.2* 18.3* --  MCV 87.0 85.3 85.1 --  PLT 122* 137* 121* --    Basename 06/26/11 1645  CKTOTAL --  CKMB --  CKMBINDEX --  TROPONINI <0.30    Basename 06/27/11 0033  HGBA1C 5.4   Micro Results: Recent Results (from the past 240 hour(s))  URINE CULTURE     Status: Normal   Collection Time   06/26/11  4:52 PM      Component Value Range Status Comment  Specimen Description URINE, CLEAN CATCH   Final    Special Requests NONE   Final    Culture  Setup Time [id number]   Final    Colony Count >=100,000 COLONIES/ML   Final    Culture     Final    Value: Multiple bacterial morphotypes present, none predominant. Suggest appropriate recollection if clinically indicated.   Report Status 06/28/2011 FINAL   Final   CULTURE, BLOOD (ROUTINE X 2)     Status: Normal (Preliminary result)   Collection Time   06/26/11  7:53 PM      Component Value Range Status Comment   Specimen Description BLOOD LEFT ARM   Final    Special Requests BOTTLES DRAWN AEROBIC AND ANAEROBIC 10CC   Final    Culture  Setup Time [id number]   Final    Culture     Final    Value:        BLOOD CULTURE RECEIVED NO GROWTH TO DATE CULTURE WILL BE HELD  FOR 5 DAYS BEFORE ISSUING A FINAL NEGATIVE REPORT   Report Status PENDING   Incomplete   CULTURE, BLOOD (ROUTINE X 2)     Status: Normal (Preliminary result)   Collection Time   06/26/11  8:02 PM      Component Value Range Status Comment   Specimen Description BLOOD LEFT HAND   Final    Special Requests BOTTLES DRAWN AEROBIC AND ANAEROBIC 10CC   Final    Culture  Setup Time [id number]   Final    Culture     Final    Value:        BLOOD CULTURE RECEIVED NO GROWTH TO DATE CULTURE WILL BE HELD FOR 5 DAYS BEFORE ISSUING A FINAL NEGATIVE REPORT   Report Status PENDING   Incomplete   MRSA PCR SCREENING     Status: Normal   Collection Time   06/27/11  5:52 AM      Component Value Range Status Comment   MRSA by PCR NEGATIVE  NEGATIVE  Final    Studies/Results: All recent x-ray/radiology reports have been reviewed in detail.   Medications: I have reviewed the patient's complete medication list.  Assessment/Plan:  hypovolemic hypotension Cont aggressive volume resuscitation -Will give one more unit of blood today to get Hgb >8  Melena - UGIB - hx of gastric varices - GI following - recommending to transition to BID protonix after current bag finished- see EGD report - he will need repeat EGD  Coagulopathy Likely due to Pradaxa which can effect the INR and a possibility of underlying coagulopathy from Cirrhosis; FFP and Vit K given , INR improving.   Acute blood loss anemia Due to GIB - - baseline Hgb was 11.3 ~1 week ago Transfusing today - see above  Hypernatremia Change IVF to D5W and watch sugars   UTI (lower urinary tract infection) vs/ colonization (illeal conduit)  d/c Cipro as culture reveal multiple morphotypes and is likely a colonization  Hematuria - bladder cancer s/p ileal conduit Mild/minimal - likely oozing due to coagulopathy - improving  cryptogenic cirrhosis Followed by Dr Russella Dar - history of gastric varices but no evidence of bleeding varices on EGD  Gastric  Varices Ultrasound to evaluate for spelenc vein thrombosis ordered by GI  Hypoglycemia in DM2 Due to meds + ARI - stable for now   AKI (acute kidney injury) Likely pre-renal due to hypotension due to acute anemia - correct these issues and follow trend - discharge creatinine after  recent hospitalization was 1.13 - improving   A-fib Was on pradaxa prior to admit - is coagulopathic w/ active GIB, so clearly can't cont pradaxa for now - may not be safe to resume pradaxa at d/c - will cont to monitor coags and GI situation   Diastolic CHF - grade 1 Well compensated at present / is dry  O2 dependent COPD Well compensated at present   Dispo Remain in SDU   Calvert Cantor, MD [phone]

## 2011-06-29 NOTE — Progress Notes (Signed)
Patient seen, examined, and I agree with the above documentation, including the assessment and plan. No further bleeding, no melena, BRBPR, etc. Getting an additional unit of pRBC today. Pradaxa on hold. Observe hgb and inr closely. Agree with change to BID PPI Will plan EGD later in hospital stay for APC ablation of gastric angioectasias, assuming coag profile improves/normalizes.

## 2011-06-29 NOTE — Progress Notes (Signed)
     [facility] Daily Rounding Note 06/29/2011, 10:21 AM  SUBJECTIVE:     Still has dark stools .  Feels well.  On clears    OBJECTIVE:        General: chronically ill looking.  NAD     Vital signs in last 24 hours:    Temp:  [97.4 F (36.3 C)-98.6 F (37 C)] 98 F (36.7 C) (06/04 0800) Pulse Rate:  [81-152] 87  (06/04 0419) Resp:  [18-27] 20  (06/04 0419) BP: (96-133)/(45-70) 122/55 mmHg (06/04 0800) SpO2:  [89 %-100 %] 99 % (06/04 0843) Last BM Date: 06/29/11  Heart: RRR Chest: clear B .   No distress Abdomen: soft, NT, obese.   GU:  Urine yellow and not bloody at all.  Extremities: no pedal edema Neuro/Psych:  Pleasant.  No confusion or anxiety  Intake/Output from previous day: 06/03 0701 - 06/04 0700 In: 2850 [I.V.:2300; Blood:550] Out: 7950 [Urine:7950]  Intake/Output this shift: Total I/O In: -  Out: 500 [Urine:500]  Lab Results:  [facility] 06/29/11 0440 06/28/11 2115 06/28/11 0930  WBC 6.5 8.3 8.4  HGB 7.5* 7.9* 6.1*  HCT 22.7* 23.2* 18.3*  PLT 122* 137* 121*   BMET  Basename 06/29/11 0440 06/28/11 0930 06/27/11 0455  NA 152* 145 139  K 4.1 4.8 5.0  CL 119* 114* 108  CO2 24 21 18*  GLUCOSE 142* 188* 100*  BUN 63* 112* 136*  CREATININE 1.39* 1.97* 2.37*  CALCIUM 8.6 8.3* 8.7   LFT  Basename 06/29/11 0440 06/28/11 0930 06/27/11 0455  PROT 5.8* 5.2* 5.0*  ALBUMIN 3.4* 3.0* 2.9*  AST 24 18 18   ALT 17 16 14   ALKPHOS 60 55 63  BILITOT 0.6 0.8 0.3  BILIDIR -- 0.2 --  IBILI -- 0.6 --   PT/INR  Basename 06/29/11 0440 06/28/11 2115  LABPROT 18.6* 18.8*  INR 1.52* 1.54*   ASSESMENT: * GI bleed  EGD 6/3:  Gastric varices, no bleeding stigmata. Non-bleeding gastric AVMs.  Mild pyloric stenosis.  On Protonix infusion.  * Gross hematuria.  Remote ileostomy for bladder cancer.    * ABL anemia from gi tract and hematuria. S/p 3 units PRBCs, despite this H & H down 1/2 gram since last PM.  .  * Coagulopathy. Persists despite S/p 5 units FFP and   VIT K 10 mg IV x 3 days, started 6/2.  * Acute renal failure, improved. Recent baseline levels unknown  * Type 2 DM.  * Diastolic CHF, 30 # wt loss since meds adjusted during 5/18-5/21 hospitalization     PLAN: *  Consider repeat EGD once Hgb and INR/coagulation profile stable for APC of angioectasias. *  Leave on clears for now. *  In AM get doppler US of abdomen and splenic/portal veins.  *  D/c Protonix drip after current bag infuses.  Then start BID po Protonix *  Defer transfusions to hospitalist.  He has not yet rounded.  I think we can aim for Hgb of 8.0 or above.     LOS: 3 days   Jennye Moccasin  06/29/2011, 10:21 AM Pager: [phone]

## 2011-06-30 ENCOUNTER — Inpatient Hospital Stay (HOSPITAL_COMMUNITY): Payer: PRIVATE HEALTH INSURANCE

## 2011-06-30 DIAGNOSIS — I4891 Unspecified atrial fibrillation: Secondary | ICD-10-CM

## 2011-06-30 DIAGNOSIS — E871 Hypo-osmolality and hyponatremia: Secondary | ICD-10-CM

## 2011-06-30 DIAGNOSIS — K922 Gastrointestinal hemorrhage, unspecified: Secondary | ICD-10-CM

## 2011-06-30 DIAGNOSIS — I864 Gastric varices: Secondary | ICD-10-CM

## 2011-06-30 DIAGNOSIS — I868 Varicose veins of other specified sites: Secondary | ICD-10-CM

## 2011-06-30 DIAGNOSIS — D684 Acquired coagulation factor deficiency: Secondary | ICD-10-CM

## 2011-06-30 LAB — GLUCOSE, CAPILLARY
Glucose-Capillary: 134 mg/dL — ABNORMAL HIGH (ref 70–99)
Glucose-Capillary: 118 mg/dL — ABNORMAL HIGH (ref 70–99)

## 2011-06-30 LAB — BASIC METABOLIC PANEL
CO2: 25 mEq/L (ref 19–32)
Chloride: 115 mEq/L — ABNORMAL HIGH (ref 96–112)
Creatinine, Ser: 1.06 mg/dL (ref 0.50–1.35)
GFR calc Af Amer: 77 mL/min — ABNORMAL LOW (ref >90)
Potassium: 3.7 mEq/L (ref 3.5–5.1)
Sodium: 146 mEq/L — ABNORMAL HIGH (ref 135–145)

## 2011-06-30 LAB — PROTIME-INR
INR: 1.37 (ref 0.00–1.49)
Prothrombin Time: 17.1 seconds — ABNORMAL HIGH (ref 11.6–15.2)

## 2011-06-30 MED ORDER — VITAMIN B-12 1000 MCG PO TABS
2500.0000 ug | ORAL_TABLET | Freq: Every day | ORAL | Status: DC
  Administered 2011-06-30 – 2011-07-05 (×6): 2500 ug via ORAL
  Filled 2011-06-30 (×7): qty 1

## 2011-06-30 MED ORDER — VITAMIN B-12 2500 MCG SL SUBL: 1.0000 | SUBLINGUAL_TABLET | Freq: Every day | SUBLINGUAL | Status: DC

## 2011-06-30 MED ORDER — SODIUM CHLORIDE 0.45 % IV SOLN
INTRAVENOUS | Status: DC
  Administered 2011-06-30: 75 mL/h via INTRAVENOUS

## 2011-06-30 MED ORDER — SODIUM CHLORIDE 0.9 % IJ SOLN
INTRAMUSCULAR | Status: AC
  Filled 2011-06-30: qty 20

## 2011-06-30 MED ORDER — CARVEDILOL 6.25 MG PO TABS
6.2500 mg | ORAL_TABLET | Freq: Two times a day (BID) | ORAL | Status: DC
  Administered 2011-06-30 – 2011-07-05 (×11): 6.25 mg via ORAL
  Filled 2011-06-30 (×15): qty 1

## 2011-06-30 MED ORDER — ACETAMINOPHEN 325 MG PO TABS
650.0000 mg | ORAL_TABLET | Freq: Four times a day (QID) | ORAL | Status: DC | PRN
  Administered 2011-07-02: 650 mg via ORAL
  Filled 2011-06-30 (×2): qty 2

## 2011-06-30 NOTE — Progress Notes (Signed)
Patient seen, examined, and I agree with the above documentation, including the assessment and plan. No further overt bleeding, Hgb now > 8 after additional 1 u pRBC yesterday INR has improved, pradaxa has been off. Will plan EGD tomorrow for APC ablation of known gastric angioectasias Splenic vein doppler pending to rule out thrombosis given IGV seen at EGD.

## 2011-06-30 NOTE — Progress Notes (Signed)
     [facility] Daily Rounding Note 06/30/2011, 10:08 AM  SUBJECTIVE:       Last BM 8 PM 6/4.  Was still dark and a bit tarry.  No nausea.  Has not been out of bed for several days.  No nausea.  No hematuria. No chest pain or dyspnea. Npo for ultrasound.   OBJECTIVE:        General: looks better but still chronically ill looking     Vital signs in last 24 hours:    Temp:  [97.4 F (36.3 C)-98.4 F (36.9 C)] 97.9 F (36.6 C) (06/05 0746) Pulse Rate:  [77-92] 92  (06/05 0815) Resp:  [17-23] 23  (06/05 0815) BP: (111-133)/(45-61) 124/54 mmHg (06/05 0744) SpO2:  [96 %-100 %] 100 % (06/05 0815) Weight:  [222 lb 0.1 oz (100.7 kg)] 222 lb 0.1 oz (100.7 kg) (06/05 0433) Last BM Date: 06/29/11  Heart: RRR Chest: clear.  Loose cough Abdomen: soft, NT, ND, obese.  Extremities: no pedal edema Neuro/Psych:  Pleasant, relaxed, not confused.   Intake/Output from previous day: 06/04 0701 - 06/05 0700 In: 1320 [P.O.:120; I.V.:1200] Out: 2900 [Urine:2900]  Intake/Output this shift: Total I/O In: -  Out: 250 [Urine:250]  Lab Results:  [facility] 06/29/11 2109 06/29/11 1640 06/29/11 0440  WBC 7.5 6.8 6.5  HGB 8.1* 7.4* 7.5*  HCT 24.2* 22.0* 22.7*  PLT 120* 121* 122*   BMET  Basename 06/30/11 0429 06/29/11 0440 06/28/11 0930  NA 146* 152* 145  K 3.7 4.1 4.8  CL 115* 119* 114*  CO2 25 24 21   GLUCOSE 140* 142* 188*  BUN 26* 63* 112*  CREATININE 1.06 1.39* 1.97*  CALCIUM 8.4 8.6 8.3*   LFT  Basename 06/29/11 0440 06/28/11 0930  PROT 5.8* 5.2*  ALBUMIN 3.4* 3.0*  AST 24 18  ALT 17 16  ALKPHOS 60 55  BILITOT 0.6 0.8  BILIDIR -- 0.2  IBILI -- 0.6   PT/INR  Basename 06/30/11 0429 06/29/11 0440  LABPROT 17.1* 18.6*  INR 1.37 1.52*    ASSESMENT: * GI bleed  EGD 6/3: Gastric varices, no bleeding stigmata. Non-bleeding gastric AVMs. Mild pyloric stenosis.  On PO Protonix BID. *  cryptogennic cirrhosis.   * Gross hematuria. Remote ileostomy for bladder cancer.  Resolved  for . 24 hours.  * ABL anemia from gi tract and hematuria. S/p 3 units PRBCs.  .  * Coagulopathy.  S/p 5 units FFP and VIT K 10 mg IV  6/2 - 6/4. Pt still mildly elevated, INR normal.  * Acute renal failure, nearly resolved. Recent baseline levels unknown  * Type 2 DM.  * Diastolic CHF.  Weight up 10 # in 5 days, 5 # in 3 days.    PLAN: *  Await doppler u/s study, r/o splenic v. Thrombosis.  *  EGD tomorrow to treat the gastric AVMs. *  Cbc and pt/inr in AM   LOS: 4 days   Jennye Moccasin  06/30/2011, 10:08 AM Pager: [phone]

## 2011-06-30 NOTE — Progress Notes (Signed)
[facility] [place] TEAM 1 - Stepdown/ICU TEAM  PCP:  Eustaquio Boyden, MD, MD  Subjective: [phone] admitted with complaints of generalized malaise and dizzinness associated with chills, and poor appetite ongoing for 2 days.  In the ED his BP was in the 70s at presentation, but responded to fluid challenges into the low 100s systolic, occasionally in the 57Q. His renal function has taken a turn for the worse, from a baseline creatinine of 1.2 in December 2012 to 2.65 at presentation.  Of note Mr Goodchild was hospitalized at [facility] a week ago (5/18-21/2013)  for an acute on chronic respiratory failure/chf decompensation. He has continued to take hypoglycemics and BP meds as prescribed, including metformin / pradaxa / amaryl / zocor / losartan / coreg / lasix, which may be contributing to the ARF/hypoglycemia and hypotension.   Since his admit, the pt has been noted to be having melanotic stools.  He has also declared a marked anemia.  He admits that he has been having similar dark/black stools for "a few days or more" at home.    Pt c/o being hungry.  Is anxious to go home.  Denies any further melena today, cp, n/v, or abdom pain.     Objective:  Intake/Output Summary (Last 24 hours) at 06/30/11 1459 Last data filed at 06/30/11 1122  Gross per 24 hour  Intake   1200 ml  Output   2175 ml  Net   -975 ml   Blood pressure 124/55, pulse 87, temperature 98.2 F (36.8 C), temperature source Oral, resp. rate 20, height 5\' 8"  (1.727 m), weight 100.7 kg (222 lb 0.1 oz), SpO2 100.00%.  CBG (last 3)   Basename 06/30/11 1222 06/30/11 0812 06/29/11 2153  GLUCAP 134* 141* 113*   Physical Exam: General: No acute respiratory distress - no longer pale Lungs: Clear to auscultation bilaterally without wheezes or crackles - distant breath sounds Cardiovascular: irreg irreg w/o gallup or rub - no appreciable M Abdomen: mildly tender diffusely, nondistended, soft, bowel sounds positive, no  rebound, no ascites, no appreciable mass - ileal conduit in R LQ with pink tinged clear urine produced Extremities: trace B LE edema w/o cyanosis or clubbing  Lab Results:  Basename 06/30/11 0429 06/29/11 0440 06/28/11 0930  NA 146* 152* 145  K 3.7 4.1 4.8  CL 115* 119* 114*  CO2 25 24 21   GLUCOSE 140* 142* 188*  BUN 26* 63* 112*  CREATININE 1.06 1.39* 1.97*  CALCIUM 8.4 8.6 8.3*  MG -- -- --  PHOS -- -- 4.6    Basename 06/29/11 0440 06/28/11 0930  AST 24 18  ALT 17 16  ALKPHOS 60 55  BILITOT 0.6 0.8  PROT 5.8* 5.2*  ALBUMIN 3.4* 3.0*    Basename 06/29/11 2109 06/29/11 1640 06/29/11 0440  WBC 7.5 6.8 6.5  NEUTROABS -- -- --  HGB 8.1* 7.4* 7.5*  HCT 24.2* 22.0* 22.7*  MCV 88.0 87.6 87.0  PLT 120* 121* 122*   Micro Results: Recent Results (from the past 240 hour(s))  URINE CULTURE     Status: Normal   Collection Time   06/26/11  4:52 PM      Component Value Range Status Comment   Specimen Description URINE, CLEAN CATCH   Final    Special Requests NONE   Final    Culture  Setup Time [id number]   Final    Colony Count >=100,000 COLONIES/ML   Final    Culture     Final  Value: Multiple bacterial morphotypes present, none predominant. Suggest appropriate recollection if clinically indicated.   Report Status 06/28/2011 FINAL   Final   CULTURE, BLOOD (ROUTINE X 2)     Status: Normal (Preliminary result)   Collection Time   06/26/11  7:53 PM      Component Value Range Status Comment   Specimen Description BLOOD LEFT ARM   Final    Special Requests BOTTLES DRAWN AEROBIC AND ANAEROBIC 10CC   Final    Culture  Setup Time [id number]   Final    Culture     Final    Value:        BLOOD CULTURE RECEIVED NO GROWTH TO DATE CULTURE WILL BE HELD FOR 5 DAYS BEFORE ISSUING A FINAL NEGATIVE REPORT   Report Status PENDING   Incomplete   CULTURE, BLOOD (ROUTINE X 2)     Status: Normal (Preliminary result)   Collection Time   06/26/11  8:02 PM      Component Value Range Status  Comment   Specimen Description BLOOD LEFT HAND   Final    Special Requests BOTTLES DRAWN AEROBIC AND ANAEROBIC 10CC   Final    Culture  Setup Time [id number]   Final    Culture     Final    Value:        BLOOD CULTURE RECEIVED NO GROWTH TO DATE CULTURE WILL BE HELD FOR 5 DAYS BEFORE ISSUING A FINAL NEGATIVE REPORT   Report Status PENDING   Incomplete   MRSA PCR SCREENING     Status: Normal   Collection Time   06/27/11  5:52 AM      Component Value Range Status Comment   MRSA by PCR NEGATIVE  NEGATIVE  Final    Studies/Results: All recent x-ray/radiology reports have been reviewed in detail.   Medications: I have reviewed the patient's complete medication list.  Assessment/Plan:  hypovolemic hypotension BP is much improved after aggressive volume resuscitation including transfusion   Melena - UGIB - hx of gastric varices Only acute issue noted on EGD was AVMs - GI following - transitioned to BID protonix - for repeat EGD in AM in endo sweet (first was at bedside) to allow APC tx of AVMs   Coagulopathy Pradaxa is known to interfere with the INR assay, but wether or not this "falsely elevated" INR is indicative of a true PT related coagulopathy is a matter of debate - nonetheless, in this situation, we have tx the pt aggressively with FFP and Vitamin K due to his active bleeding - he is, of course, off the pradaxa as well - we can't definitively r/o a cirrhosis related baseline coagulopathy, and will need to f/u coags prior to d/c as well as in the outpt setting   Acute blood loss anemia Due to GIB - baseline Hgb was 11.3 ~1 week ago - Hgb is now holding steady/improving s/p 6 units total of PRBC infused  Hypernatremia Changed IVF to D5W - could be a true hypernatremia due to high salt load associated w/ PRBC - change to 1/2 NS today and f/u in AM  UTI (lower urinary tract infection) vs/ colonization (illeal conduit)  d/c Cipro as culture reveal multiple morphotypes and is likely a  colonization  Hematuria - bladder cancer s/p ileal conduit Mild/minimal - likely oozing due to coagulopathy - improving  cryptogenic cirrhosis Followed by Dr Russella Dar - gastric varices confirmed on EGD but no evidence of bleeding varices - ultrasound to evaluate for splenic  vein thrombosis ordered by GI  Hypoglycemia in DM2 Due to meds + ARI - stable for now   AKI (acute kidney injury) Likely pre-renal due to hypotension due to acute anemia - resolved w/ volume resuscitation  A-fib Was on pradaxa prior to admit - is coagulopathic w/ active GIB, so clearly can't cont pradaxa for now - likely will not be safe to resume pradaxa at d/c - will cont to monitor coags and GI situation   Diastolic CHF - grade 1 Well compensated at present   O2 dependent COPD Well compensated at present   Dispo Remain in SDU - for EGD tomorrow  Lonia Blood, MD [facility] Office  [phone] Pager [phone]  On-Call/Text Page:      [email or web address]      password [facility]

## 2011-07-01 ENCOUNTER — Encounter (HOSPITAL_COMMUNITY): Payer: Self-pay | Admitting: Gastroenterology

## 2011-07-01 ENCOUNTER — Encounter (HOSPITAL_COMMUNITY)
Admission: EM | Disposition: A | Discharge status: Home / Self Care | Payer: Self-pay | Source: Ambulatory Visit | Attending: Internal Medicine

## 2011-07-01 ENCOUNTER — Telehealth: Payer: Self-pay

## 2011-07-01 DIAGNOSIS — E871 Hypo-osmolality and hyponatremia: Secondary | ICD-10-CM

## 2011-07-01 DIAGNOSIS — D684 Acquired coagulation factor deficiency: Secondary | ICD-10-CM

## 2011-07-01 DIAGNOSIS — K922 Gastrointestinal hemorrhage, unspecified: Secondary | ICD-10-CM

## 2011-07-01 DIAGNOSIS — I4891 Unspecified atrial fibrillation: Secondary | ICD-10-CM

## 2011-07-01 HISTORY — PX: ESOPHAGOGASTRODUODENOSCOPY: SHX5428

## 2011-07-01 LAB — CBC
MCHC: 33.5 g/dL (ref 30.0–36.0)
Platelets: 120 10*3/uL — ABNORMAL LOW (ref 150–400)
RDW: 15.8 % — ABNORMAL HIGH (ref 11.5–15.5)

## 2011-07-01 LAB — PROTIME-INR
INR: 1.22 (ref 0.00–1.49)
Prothrombin Time: 15.7 seconds — ABNORMAL HIGH (ref 11.6–15.2)

## 2011-07-01 LAB — GLUCOSE, CAPILLARY: Glucose-Capillary: 110 mg/dL — ABNORMAL HIGH (ref 70–99)

## 2011-07-01 LAB — BASIC METABOLIC PANEL
Calcium: 8.4 mg/dL (ref 8.4–10.5)
Creatinine, Ser: 1.01 mg/dL (ref 0.50–1.35)
GFR calc Af Amer: 81 mL/min — ABNORMAL LOW (ref >90)
GFR calc non Af Amer: 70 mL/min — ABNORMAL LOW (ref >90)

## 2011-07-01 SURGERY — EGD (ESOPHAGOGASTRODUODENOSCOPY)
Anesthesia: Moderate Sedation

## 2011-07-01 MED ORDER — MIDAZOLAM HCL 10 MG/2ML IJ SOLN
INTRAMUSCULAR | Status: DC | PRN
  Administered 2011-07-01 (×2): 2 mg via INTRAVENOUS

## 2011-07-01 MED ORDER — FENTANYL NICU IV SYRINGE 50 MCG/ML
INJECTION | INTRAMUSCULAR | Status: DC | PRN
  Administered 2011-07-01 (×2): 20 ug via INTRAVENOUS

## 2011-07-01 MED ORDER — LOSARTAN POTASSIUM 50 MG PO TABS
100.0000 mg | ORAL_TABLET | Freq: Every day | ORAL | Status: DC
  Administered 2011-07-01 – 2011-07-02 (×2): 100 mg via ORAL
  Filled 2011-07-01 (×3): qty 2

## 2011-07-01 MED ORDER — FENTANYL CITRATE 0.05 MG/ML IJ SOLN
INTRAMUSCULAR | Status: AC
  Filled 2011-07-01: qty 2

## 2011-07-01 MED ORDER — SODIUM CHLORIDE 0.9 % IV SOLN
INTRAVENOUS | Status: DC
  Administered 2011-07-01: 500 mL via INTRAVENOUS

## 2011-07-01 MED ORDER — MIDAZOLAM HCL 10 MG/2ML IJ SOLN
INTRAMUSCULAR | Status: AC
  Filled 2011-07-01: qty 2

## 2011-07-01 MED ORDER — BUTAMBEN-TETRACAINE-BENZOCAINE 2-2-14 % EX AERO
INHALATION_SPRAY | CUTANEOUS | Status: DC | PRN
  Administered 2011-07-01 (×2): 1 via TOPICAL

## 2011-07-01 MED ORDER — PANTOPRAZOLE SODIUM 40 MG PO TBEC
40.0000 mg | DELAYED_RELEASE_TABLET | Freq: Every day | ORAL | Status: DC
  Administered 2011-07-02 – 2011-07-05 (×4): 40 mg via ORAL
  Filled 2011-07-01 (×4): qty 1

## 2011-07-01 NOTE — Telephone Encounter (Signed)
Message left advising that I was unaware that patient was inpatient when I left previous message. Advised she could disregard it and call me back if she needed anything else.

## 2011-07-01 NOTE — Progress Notes (Signed)
For EGD with ablative therapy to gastric AVMs  Did not reexamine or see pt. Note VSS and afebrile.  Labs: PT 15.7   INR 1/2 Hgb 8.1  Crit 24.2  Platelets 120  Results of AbdPelvic doppler study 6/5: Normal patency of the splenic vein, portal vein and hepatic veins  without abnormal portal velocities or abnormal direction of portal  vein flow. There is evidence of moderate splenomegaly.   Assesment/Plan: +  Isolated gastric varices, non-bleeding.  No evidence splenic v. Thrombosis +  Gastric AVMs.  EGD with ablation today. +  cryptogennic cirrhosis +  Anemia.  Stable, low Hgb +  Thrombocytopenia, splenomegaly.   Jennye Moccasin  PA-C

## 2011-07-01 NOTE — Telephone Encounter (Signed)
[place] pts daughter returned call. Call back [phone].

## 2011-07-01 NOTE — Progress Notes (Signed)
O2 increased to 5L.  

## 2011-07-01 NOTE — Op Note (Signed)
[facility] [address] [place], [place]  [address]  ENDOSCOPY PROCEDURE REPORT  PATIENT:  Capone, Schwinn  MR#:  [id number] BIRTHDATE:  04-30-1933, 78 yrs. old  GENDER:  male ENDOSCOPIST:  Carie Caddy. Wallis Vancott, MD Referred by:  [facility], PROCEDURE DATE:  07/01/2011 PROCEDURE:  EGD with lesion ablation ASA CLASS:  Class III INDICATIONS:  upper G.I. bleeding, history of MEDICATIONS:   Fentanyl 40 mcg IV, Versed 4 mg IV TOPICAL ANESTHETIC:  Cetacaine Spray  DESCRIPTION OF PROCEDURE:   After the risks benefits and alternatives of the procedure were thoroughly explained, informed consent was obtained.  The Pentax Gastroscope [id number] endoscope was introduced through the mouth and advanced to the second portion of the duodenum, without limitations.  The instrument was slowly withdrawn as the mucosa was fully examined. <<PROCEDUREIMAGES>>  The esophagus and gastroesophageal junction were completely normal in appearance.  Nonbleeding gastric varices in the fundus with no stigmata of recent bleeding.  11 angioectasias were found scattered throughout the stomach. Argon Plasma Coagulator was used with success to ablate all 11 lesions (1 L/min, 40 W).  The duodenal bulb was normal in appearance, as was the postbulbar duodenum. A small submucosal nodule in the bulb of the duodenum. Otherwise unremarkable examined portions of the duodenum. Retroflexed views revealed findings as previously described. The scope was then withdrawn from the patient and the procedure completed.  COMPLICATIONS:  None  ENDOSCOPIC IMPRESSION: 1) Normal esophagus 2) Gastric varices in the fundus.  Nonbleeding. 3) Angioectasias in the total stomach treated successfully with APC. 4) Normal duodenum 5) Submucosal nodule in the bulb of duodenum.  RECOMMENDATIONS: 1) Continue daily PPI 2) Monitor Hgb/HCT and iron stores to ensure normalization. 3) GI clinic follow-up after discharge 4) The  need for possible further evaluation of submucosal nodule in duodenum can be discussed in follow-up.  Carie Caddy. Rhea Belton, MD  CC:  The Patient Claudette Head, MD Eustaquio Boyden MD  n. Rosalie DoctorCarie Caddy. Brenlyn Beshara at 07/01/2011 03:08 PM  Baxter Kail, [phone]

## 2011-07-01 NOTE — Progress Notes (Signed)
Patient seen, examined, and I agree with the above documentation, including the assessment and plan. Repeat EGD today for ablation of known gastric angioectasias

## 2011-07-01 NOTE — Progress Notes (Signed)
[facility] [place] TEAM 1 - Stepdown/ICU TEAM  PCP:  Eustaquio Boyden, MD, MD  Subjective: [phone] admitted with complaints of generalized malaise and dizzinness associated with chills, and poor appetite ongoing for 2 days.  In the ED his BP was in the 70s at presentation, but responded to fluid challenges into the low 100s systolic, occasionally in the 98J. His renal function has taken a turn for the worse, from a baseline creatinine of 1.2 in December 2012 to 2.65 at presentation.  Of note Mr Schnackenberg was hospitalized at [facility] a week ago (5/18-21/2013)  for an acute on chronic respiratory failure/chf decompensation. He has continued to take hypoglycemics and BP meds as prescribed, including metformin / pradaxa / amaryl / zocor / losartan / coreg / lasix, which may be contributing to the ARF/hypoglycemia and hypotension.   Continues to have watery/tarry black stools. Otherwise states he feels well.    Objective:  Intake/Output Summary (Last 24 hours) at 07/01/11 1840 Last data filed at 07/01/11 0817  Gross per 24 hour  Intake    600 ml  Output    800 ml  Net   -200 ml   Blood pressure 125/64, pulse 75, temperature 98.5 F (36.9 C), temperature source Oral, resp. rate 14, height 5\' 8"  (1.727 m), weight 100.1 kg (220 lb 10.9 oz), SpO2 97.00%.  CBG (last 3)   Basename 07/01/11 1303 07/01/11 0757 06/30/11 2204  GLUCAP 110* 105* 105*   Physical Exam: General: No acute respiratory distress - no longer pale Lungs: Clear to auscultation bilaterally without wheezes or crackles - distant breath sounds Cardiovascular: irreg irreg w/o gallup or rub - no appreciable M Abdomen: soft, non-tender, BS +, non-distended - ileal conduit in RLQ- clear urine in foley bag  Extremities: trace B LE edema w/o cyanosis or clubbing  Lab Results:  Basename 07/01/11 0420 06/30/11 0429 06/29/11 0440  NA 141 146* 152*  K 3.7 3.7 4.1  CL 108 115* 119*  CO2 25 25 24   GLUCOSE 103* 140* 142*   BUN 17 26* 63*  CREATININE 1.01 1.06 1.39*  CALCIUM 8.4 8.4 8.6  MG -- -- --  PHOS -- -- --    Basename 06/29/11 0440  AST 24  ALT 17  ALKPHOS 60  BILITOT 0.6  PROT 5.8*  ALBUMIN 3.4*    Basename 07/01/11 0420 06/29/11 2109 06/29/11 1640  WBC 6.0 7.5 6.8  NEUTROABS -- -- --  HGB 8.1* 8.1* 7.4*  HCT 24.2* 24.2* 22.0*  MCV 89.0 88.0 87.6  PLT 120* 120* 121*   Micro Results: Recent Results (from the past 240 hour(s))  URINE CULTURE     Status: Normal   Collection Time   06/26/11  4:52 PM      Component Value Range Status Comment   Specimen Description URINE, CLEAN CATCH   Final    Special Requests NONE   Final    Culture  Setup Time [id number]   Final    Colony Count >=100,000 COLONIES/ML   Final    Culture     Final    Value: Multiple bacterial morphotypes present, none predominant. Suggest appropriate recollection if clinically indicated.   Report Status 06/28/2011 FINAL   Final   CULTURE, BLOOD (ROUTINE X 2)     Status: Normal (Preliminary result)   Collection Time   06/26/11  7:53 PM      Component Value Range Status Comment   Specimen Description BLOOD LEFT ARM   Final  Special Requests BOTTLES DRAWN AEROBIC AND ANAEROBIC 10CC   Final    Culture  Setup Time [id number]   Final    Culture     Final    Value:        BLOOD CULTURE RECEIVED NO GROWTH TO DATE CULTURE WILL BE HELD FOR 5 DAYS BEFORE ISSUING A FINAL NEGATIVE REPORT   Report Status PENDING   Incomplete   CULTURE, BLOOD (ROUTINE X 2)     Status: Normal (Preliminary result)   Collection Time   06/26/11  8:02 PM      Component Value Range Status Comment   Specimen Description BLOOD LEFT HAND   Final    Special Requests BOTTLES DRAWN AEROBIC AND ANAEROBIC 10CC   Final    Culture  Setup Time [id number]   Final    Culture     Final    Value:        BLOOD CULTURE RECEIVED NO GROWTH TO DATE CULTURE WILL BE HELD FOR 5 DAYS BEFORE ISSUING A FINAL NEGATIVE REPORT   Report Status PENDING   Incomplete   MRSA  PCR SCREENING     Status: Normal   Collection Time   06/27/11  5:52 AM      Component Value Range Status Comment   MRSA by PCR NEGATIVE  NEGATIVE  Final    Studies/Results: All recent x-ray/radiology reports have been reviewed in detail.   Medications: I have reviewed the patient's complete medication list.  Assessment/Plan:  hypovolemic hypotension BP is much improved after aggressive volume resuscitation including transfusion  Will stop hydration today  Melena - UGIB - hx of gastric varices Has had repeat EGD-  APC tx of AVMs  Follow stoold- no blood noted on EGD Daily ppi, check iron stores, f/u in GI clinic - may need further eval of a duodenal submucosal nodule.   Coagulopathy Pradaxa is known to interfere with the INR assay, but wether or not this "falsely elevated" INR is indicative of a true PT related coagulopathy is a matter of debate - nonetheless, in this situation, we have tx the pt aggressively with FFP and Vitamin K due to his active bleeding - he is, of course, off the pradaxa as well  Repeat coags reveal only a very mild elevation- PT 15.7, INR- 1.22.   Acute blood loss anemia Due to GIB - baseline Hgb was 11.3 ~1 week ago - Hgb is now holding steady/improving s/p 6 units total of PRBC infused  Hypernatremia Resolved- was secondary to NS  UTI (lower urinary tract infection) vs/ colonization (illeal conduit)  d/c Cipro as culture reveal multiple morphotypes and is likely a colonization  Hematuria - bladder cancer s/p ileal conduit Mild/minimal - likely oozing due to coagulopathy resolved Remove foley bag today.   cryptogenic cirrhosis Followed by Dr Russella Dar - gastric varices confirmed on EGD but no evidence of bleeding varices - ultrasound to evaluate for splenic vein thrombosis was negative.   Hypoglycemia in DM2 Due to meds + ARI - stable for now   AKI (acute kidney injury) Likely pre-renal due to hypotension due to acute anemia - resolved w/ volume  resuscitation  A-fib Was on pradaxa prior to admit - is coagulopathic w/ active GIB, so clearly can't cont pradaxa for now - likely will not be safe to resume pradaxa at d/c   Diastolic CHF - grade 1 Well compensated at present - stopping IVF  O2 dependent COPD Well compensated at present   Dispo Remain in  SDU due to black- tarry stools.   Calvert Cantor, MD [facility] Office  [phone] Pager [phone]  On-Call/Text Page:      [email or web address]      password [facility]

## 2011-07-01 NOTE — Evaluation (Signed)
Physical Therapy Evaluation Patient Details Name: [name] MRN: [id number] DOB: 02/21/1933 Today's Date: 07/01/2011 Time: 0800-     PT Assessment / Plan / Recommendation Clinical Impression  Pt is 78 y/o male admitted for GI bleed, generalized weakness and hypotension.  Pt very agreeable to ambulation and mobility.  Pt will benefit from acute PT services to improve overall mobility and attempt to advance gait without AD.    PT Assessment  Patient needs continued PT services    Follow Up Recommendations  No PT follow up    Barriers to Discharge        lEquipment Recommendations  None recommended by PT    Recommendations for Other Services     Frequency Min 3X/week    Precautions / Restrictions Restrictions Weight Bearing Restrictions: No   Pertinent Vitals/Pain No c/o pain      Mobility  Bed Mobility Bed Mobility: Not assessed Transfers Transfers: Sit to Stand;Stand to Sit Sit to Stand: 4: Min guard;From chair/3-in-1 Stand to Sit: 4: Min guard;To chair/3-in-1 Details for Transfer Assistance: Minguard for safety Ambulation/Gait Ambulation/Gait Assistance: 4: Min guard Ambulation Distance (Feet): 175 Feet Assistive device: Rolling walker Ambulation/Gait Assistance Details: Minguard for safety with cues for RW placement Gait Pattern: Within Functional Limits    Exercises     PT Diagnosis: Generalized weakness  PT Problem List: Decreased activity tolerance;Decreased mobility;Decreased knowledge of use of DME PT Treatment Interventions: DME instruction;Gait training;Stair training;Functional mobility training;Therapeutic activities;Therapeutic exercise;Patient/family education   PT Goals Acute Rehab PT Goals PT Goal Formulation: With patient/family Time For Goal Achievement: 07/08/11 Potential to Achieve Goals: Good Pt will go Supine/Side to Sit: Independently PT Goal: Supine/Side to Sit - Progress: Goal set today Pt will go Sit to Supine/Side: Independently PT  Goal: Sit to Supine/Side - Progress: Goal set today Pt will go Sit to Stand: Independently PT Goal: Sit to Stand - Progress: Goal set today Pt will go Stand to Sit: Independently PT Goal: Stand to Sit - Progress: Goal set today Pt will Ambulate: >150 feet;with modified independence;with least restrictive assistive device PT Goal: Ambulate - Progress: Goal set today Pt will Go Up / Down Stairs: 3-5 stairs;with modified independence;with supervision;with least restrictive assistive device PT Goal: Up/Down Stairs - Progress: Goal set today  Visit Information  Last PT Received On: 07/01/11 Assistance Needed: +1    Subjective Data  Subjective: "We're not going close to mirror are we because I need to shave." Patient Stated Goal: To go home.   Prior Functioning  Home Living Lives With: Spouse Available Help at Discharge: Family Type of Home: House Home Access: Stairs to enter (Has ramp in back but was using steps) Entrance Stairs-Number of Steps: 5 Entrance Stairs-Rails: Right;Left Home Layout: One level Bathroom Shower/Tub: Curtain;Tub/shower unit Bathroom Toilet: Standard Bathroom Accessibility: Yes Home Adaptive Equipment: Walker - rolling;Straight cane;Hand-held shower hose;Shower chair without back Prior Function Level of Independence: Independent Able to Take Stairs?: Yes Driving: Yes Vocation: Retired Musician: No difficulties    Cognition  Overall Cognitive Status: Appears within functional limits for tasks assessed/performed    Extremity/Trunk Assessment Right Lower Extremity Assessment RLE ROM/Strength/Tone: Within functional levels Left Lower Extremity Assessment LLE ROM/Strength/Tone: Within functional levels   Balance    End of Session PT - End of Session Equipment Utilized During Treatment: Gait belt;Oxygen (3L) Activity Tolerance: Patient tolerated treatment well Patient left: in chair;with call bell/phone within reach;with family/visitor  present Nurse Communication: Mobility status   [name] 07/01/2011, 8:35 AM [facility],  PT DPT [phone]

## 2011-07-02 ENCOUNTER — Encounter (HOSPITAL_COMMUNITY): Payer: Self-pay | Admitting: Internal Medicine

## 2011-07-02 DIAGNOSIS — K922 Gastrointestinal hemorrhage, unspecified: Secondary | ICD-10-CM

## 2011-07-02 DIAGNOSIS — D684 Acquired coagulation factor deficiency: Secondary | ICD-10-CM

## 2011-07-02 DIAGNOSIS — E871 Hypo-osmolality and hyponatremia: Secondary | ICD-10-CM

## 2011-07-02 DIAGNOSIS — I4891 Unspecified atrial fibrillation: Secondary | ICD-10-CM

## 2011-07-02 LAB — GLUCOSE, CAPILLARY
Glucose-Capillary: 124 mg/dL — ABNORMAL HIGH (ref 70–99)
Glucose-Capillary: 172 mg/dL — ABNORMAL HIGH (ref 70–99)
Glucose-Capillary: 126 mg/dL — ABNORMAL HIGH (ref 70–99)

## 2011-07-02 LAB — CBC
HCT: 23.9 % — ABNORMAL LOW (ref 39.0–52.0)
Hemoglobin: 8.5 g/dL — ABNORMAL LOW (ref 13.0–17.0)
MCH: 29.7 pg (ref 26.0–34.0)
MCHC: 33.5 g/dL (ref 30.0–36.0)
MCV: 88.8 fL (ref 78.0–100.0)
MCV: 87.9 fL (ref 78.0–100.0)
Platelets: 161 10*3/uL (ref 150–400)
RBC: 2.89 MIL/uL — ABNORMAL LOW (ref 4.22–5.81)
RDW: 15.9 % — ABNORMAL HIGH (ref 11.5–15.5)
WBC: 6.9 10*3/uL (ref 4.0–10.5)
WBC: 16.7 10*3/uL — ABNORMAL HIGH (ref 4.0–10.5)

## 2011-07-02 LAB — IRON AND TIBC
Iron: 21 ug/dL — ABNORMAL LOW (ref 42–135)
Saturation Ratios: 8 % — ABNORMAL LOW (ref 20–55)
TIBC: 264 ug/dL (ref 215–435)

## 2011-07-02 MED ORDER — SODIUM CHLORIDE 0.9 % IV SOLN
250.0000 mg | INTRAVENOUS | Status: AC
  Administered 2011-07-02 – 2011-07-03 (×2): 250 mg via INTRAVENOUS
  Filled 2011-07-02 (×3): qty 20

## 2011-07-02 MED ORDER — FUROSEMIDE 20 MG PO TABS
20.0000 mg | ORAL_TABLET | Freq: Two times a day (BID) | ORAL | Status: DC
  Administered 2011-07-02 – 2011-07-03 (×3): 20 mg via ORAL
  Filled 2011-07-02 (×4): qty 1

## 2011-07-02 NOTE — Care Management Note (Signed)
    Page 1 of 1   07/02/2011     4:51:58 PM   CARE MANAGEMENT NOTE 07/02/2011  Patient:  [name],[name]   Account Number:  [id number]  Date Initiated:  07/02/2011  Documentation initiated by:  Donn Pierini  Subjective/Objective Assessment:   Pt admitted with GIB     Action/Plan:   PTA pt lived at home with spouse,independent with ADLs   Anticipated DC Date:  07/03/2011   Anticipated DC Plan:  HOME/SELF CARE      DC Planning Services  CM consult  Follow-up appt scheduled      Choice offered to / List presented to:             Status of service:  Completed, signed off Medicare Important Message given?   (If response is "NO", the following Medicare IM given date fields will be blank) Date Medicare IM given:   Date Additional Medicare IM given:    Discharge Disposition:  HOME/SELF CARE  Per UR Regulation:  Reviewed for med. necessity/level of care/duration of stay  If discussed at Long Length of Stay Meetings, dates discussed:    Comments:  PCP- Sharen Hones  07/02/11- 1130- Donn Pierini RN BSN [phone] Spoke with pt and wife at bedside- per conversation pt plans to return home with wife, has medication coverage - call made to pt's PCP for f/u appointment next week for BMET- appointment made for June 13th at 3:00 - placed on d/c instructions. No further needs indentifed.

## 2011-07-02 NOTE — Progress Notes (Signed)
Utilization review completed.  

## 2011-07-02 NOTE — Progress Notes (Addendum)
[facility] [place] TEAM 1 - Stepdown/ICU TEAM  PCP:  Eustaquio Boyden, MD, MD  Subjective: [phone] admitted with complaints of generalized malaise and dizzinness associated with chills, and poor appetite ongoing for 2 days.  In the ED his BP was in the 70s at presentation, but responded to fluid challenges into the low 100s systolic, occasionally in the 41L. His renal function has taken a turn for the worse, from a baseline creatinine of 1.2 in December 2012 to 2.65 at presentation.  Of note Mr Sternberg was hospitalized at [facility] a week ago (5/18-21/2013)  for an acute on chronic respiratory failure/chf decompensation. He has continued to take hypoglycemics and BP meds as prescribed, including metformin / pradaxa / amaryl / zocor / losartan / coreg / lasix, which may be contributing to the ARF/hypoglycemia and hypotension.   He has had another tarry black BM today which is positive for occult blood.    Objective:  Intake/Output Summary (Last 24 hours) at 07/02/11 1822 Last data filed at 07/02/11 1324  Gross per 24 hour  Intake    480 ml  Output   1252 ml  Net   -772 ml   Blood pressure 160/66, pulse 79, temperature 99.4 F (37.4 C), temperature source Oral, resp. rate 21, height 5\' 8"  (1.727 m), weight 99.9 kg (220 lb 3.8 oz), SpO2 100.00%.  CBG (last 3)   Basename 07/02/11 1608 07/02/11 1151 07/02/11 0802  GLUCAP 172* 124* 83   Physical Exam: General: No acute respiratory distress - no longer pale Lungs: Clear to auscultation bilaterally without wheezes or crackles - distant breath sounds Cardiovascular: irreg irreg w/o gallup or rub - no appreciable M Abdomen: soft, non-tender, BS +, non-distended - ileal conduit in RLQ- clear urine in foley bag  Extremities: 2+  LE edema w/o cyanosis or clubbing  Lab Results:  Basename 07/01/11 0420 06/30/11 0429  NA 141 146*  K 3.7 3.7  CL 108 115*  CO2 25 25  GLUCOSE 103* 140*  BUN 17 26*  CREATININE 1.01 1.06    CALCIUM 8.4 8.4  MG -- --  PHOS -- --   No results found for this basename: AST:2,ALT:2,ALKPHOS:2,BILITOT:2,PROT:2,ALBUMIN:2 in the last 72 hours  Basename 07/02/11 0405 07/01/11 0420 06/29/11 2109  WBC 6.9 6.0 7.5  NEUTROABS -- -- --  HGB 8.0* 8.1* 8.1*  HCT 23.9* 24.2* 24.2*  MCV 88.8 89.0 88.0  PLT 128* 120* 120*   Micro Results: Recent Results (from the past 240 hour(s))  URINE CULTURE     Status: Normal   Collection Time   06/26/11  4:52 PM      Component Value Range Status Comment   Specimen Description URINE, CLEAN CATCH   Final    Special Requests NONE   Final    Culture  Setup Time [id number]   Final    Colony Count >=100,000 COLONIES/ML   Final    Culture     Final    Value: Multiple bacterial morphotypes present, none predominant. Suggest appropriate recollection if clinically indicated.   Report Status 06/28/2011 FINAL   Final   CULTURE, BLOOD (ROUTINE X 2)     Status: Normal (Preliminary result)   Collection Time   06/26/11  7:53 PM      Component Value Range Status Comment   Specimen Description BLOOD LEFT ARM   Final    Special Requests BOTTLES DRAWN AEROBIC AND ANAEROBIC 10CC   Final    Culture  Setup Time [id number]  Final    Culture     Final    Value:        BLOOD CULTURE RECEIVED NO GROWTH TO DATE CULTURE WILL BE HELD FOR 5 DAYS BEFORE ISSUING A FINAL NEGATIVE REPORT   Report Status PENDING   Incomplete   CULTURE, BLOOD (ROUTINE X 2)     Status: Normal (Preliminary result)   Collection Time   06/26/11  8:02 PM      Component Value Range Status Comment   Specimen Description BLOOD LEFT HAND   Final    Special Requests BOTTLES DRAWN AEROBIC AND ANAEROBIC 10CC   Final    Culture  Setup Time [id number]   Final    Culture     Final    Value:        BLOOD CULTURE RECEIVED NO GROWTH TO DATE CULTURE WILL BE HELD FOR 5 DAYS BEFORE ISSUING A FINAL NEGATIVE REPORT   Report Status PENDING   Incomplete   MRSA PCR SCREENING     Status: Normal   Collection Time    06/27/11  5:52 AM      Component Value Range Status Comment   MRSA by PCR NEGATIVE  NEGATIVE  Final    Studies/Results: All recent x-ray/radiology reports have been reviewed in detail.   Medications: I have reviewed the patient's complete medication list.  Assessment/Plan:  hypovolemic hypotension BP is much improved after aggressive volume resuscitation including transfusion  Stopped hydration yesterday- resumed lasix today  Melena - UGIB - hx of gastric varices Has had repeat EGD-  APC tx of AVMs  Follow stools and CBC- no blood noted on EGD Daily ppi, low iron stores, f/u in GI clinic - may need further eval of a duodenal submucosal nodule.  Hold off on PO iron replacement as we are watching his black stool for clearance. Can give a few doses of IV iron.   Coagulopathy Pradaxa is known to interfere with the INR assay, but wether or not this "falsely elevated" INR is indicative of a true PT related coagulopathy is a matter of debate - nonetheless, in this situation, we have tx the pt aggressively with FFP and Vitamin K due to his active bleeding - he is, of course, off the pradaxa as well  Repeat coags reveal only a very mild elevation- PT 15.7, INR- 1.22.   Acute blood loss anemia Due to GIB - baseline Hgb was 11.3 ~1 week ago - Hgb is now holding steady/improving s/p 6 units total of PRBC infused  Hypernatremia Resolved- was secondary to NS  UTI (lower urinary tract infection) vs/ colonization (illeal conduit)  d/c Cipro as culture reveal multiple morphotypes and is likely a colonization  Hematuria - bladder cancer s/p ileal conduit Mild/minimal - likely oozing due to coagulopathy resolved Remove foley bag today.   cryptogenic cirrhosis Followed by Dr Russella Dar - gastric varices confirmed on EGD but no evidence of bleeding varices - ultrasound to evaluate for splenic vein thrombosis was negative.   Hypoglycemia in DM2 Due to meds + ARI - stable for now   AKI (acute  kidney injury) Likely pre-renal due to hypotension due to acute anemia - resolved w/ volume resuscitation  A-fib Was on pradaxa prior to admit - is coagulopathic w/ active GIB, so clearly can't cont pradaxa for now - likely will not be safe to resume pradaxa at d/c   Diastolic CHF - grade 1 Well compensated at present - stopping IVF  O2 dependent COPD Well compensated  at present   Dispo Remain in SDU due to black- tarry stools. May be old blood- cont to follow Hgb  Calvert Cantor, MD [facility] Office  [phone] Pager [phone]  On-Call/Text Page:      [email or web address]      password [facility]

## 2011-07-02 NOTE — Progress Notes (Addendum)
PT Cancellation Note  Treatment cancelled today due to patient's refusal to participate.  Pt just returned to bed after walking in hallway.  Will attempt to see this PM if time allows.  Recommend pt to continue to ambulate with RN staff.   [name] 07/02/2011, 12:15 PM Jake Shark, PT DPT [phone]

## 2011-07-03 DIAGNOSIS — R197 Diarrhea, unspecified: Secondary | ICD-10-CM

## 2011-07-03 DIAGNOSIS — E871 Hypo-osmolality and hyponatremia: Secondary | ICD-10-CM

## 2011-07-03 DIAGNOSIS — D684 Acquired coagulation factor deficiency: Secondary | ICD-10-CM

## 2011-07-03 DIAGNOSIS — K922 Gastrointestinal hemorrhage, unspecified: Secondary | ICD-10-CM

## 2011-07-03 LAB — CULTURE, BLOOD (ROUTINE X 2)
Culture  Setup Time: 201306020301
Culture: NO GROWTH

## 2011-07-03 LAB — CBC
Hemoglobin: 8 g/dL — ABNORMAL LOW (ref 13.0–17.0)
MCHC: 33.9 g/dL (ref 30.0–36.0)
WBC: 15.3 10*3/uL — ABNORMAL HIGH (ref 4.0–10.5)

## 2011-07-03 LAB — BASIC METABOLIC PANEL
CO2: 19 mEq/L (ref 19–32)
Chloride: 108 mEq/L (ref 96–112)
Creatinine, Ser: 1.36 mg/dL — ABNORMAL HIGH (ref 0.50–1.35)
Sodium: 141 mEq/L (ref 135–145)

## 2011-07-03 LAB — CLOSTRIDIUM DIFFICILE BY PCR: Toxigenic C. Difficile by PCR: POSITIVE — AB

## 2011-07-03 LAB — GLUCOSE, CAPILLARY: Glucose-Capillary: 115 mg/dL — ABNORMAL HIGH (ref 70–99)

## 2011-07-03 MED ORDER — POTASSIUM CHLORIDE IN NACL 40-0.9 MEQ/L-% IV SOLN
INTRAVENOUS | Status: DC
  Administered 2011-07-03 – 2011-07-04 (×2): via INTRAVENOUS
  Filled 2011-07-03 (×4): qty 1000

## 2011-07-03 MED ORDER — METRONIDAZOLE 500 MG PO TABS
500.0000 mg | ORAL_TABLET | Freq: Three times a day (TID) | ORAL | Status: DC
  Administered 2011-07-03: 500 mg via ORAL
  Filled 2011-07-03 (×3): qty 1

## 2011-07-03 MED ORDER — POTASSIUM CHLORIDE CRYS ER 20 MEQ PO TBCR
40.0000 meq | EXTENDED_RELEASE_TABLET | Freq: Two times a day (BID) | ORAL | Status: DC
  Administered 2011-07-03 – 2011-07-05 (×5): 40 meq via ORAL
  Filled 2011-07-03 (×6): qty 2

## 2011-07-03 MED ORDER — METRONIDAZOLE IN NACL 5-0.79 MG/ML-% IV SOLN
500.0000 mg | Freq: Three times a day (TID) | INTRAVENOUS | Status: DC
Start: 2011-07-03 — End: 2011-07-05
  Administered 2011-07-03 – 2011-07-05 (×6): 500 mg via INTRAVENOUS
  Filled 2011-07-03 (×8): qty 100

## 2011-07-03 NOTE — Progress Notes (Signed)
Lab called @0845  with positive C-diff result. Dr. Butler Denmark notified via text page. Renette Butters, Viona Gilmore

## 2011-07-03 NOTE — Progress Notes (Signed)
Patient has had four stools thus far since 1900.  None have been black and tarry; they are brown, loose and seem to have mucous.  Daphane Shepherd paged; received order for C. Diff PCR.  Will send from next stool; will continue to monitor.   Vivi Martens RN

## 2011-07-03 NOTE — Progress Notes (Signed)
Notified Dr. Butler Denmark via text page of pt's BP trend and that Coreg was given in AM but Cozaar was held--no new orders at this time. Renette Butters, Viona Gilmore

## 2011-07-03 NOTE — Progress Notes (Signed)
[facility]  TEAM 1 - Stepdown/ICU TEAM  PCP:  Eustaquio Boyden, MD, MD  Subjective: [phone] admitted with complaints of generalized malaise and dizzinness associated with chills, and poor appetite ongoing for 2 days.  In the ED his BP was in the 70s at presentation, but responded to fluid challenges into the low 100s systolic, occasionally in the 96E. His renal function has taken a turn for the worse, from a baseline creatinine of 1.2 in December 2012 to 2.65 at presentation.  Of note Mr Wiechmann was hospitalized at [facility] a week ago (5/18-21/2013)  for an acute on chronic respiratory failure/chf decompensation. He has continued to take hypoglycemics and BP meds as prescribed, including metformin / pradaxa / amaryl / zocor / losartan / coreg / lasix, which may be contributing to the ARF/hypoglycemia and hypotension.   No longer having tarry stools but has had about 10 BMs of loose stools with mucus. C-diff is positive.    Objective:  Intake/Output Summary (Last 24 hours) at 07/03/11 1738 Last data filed at 07/03/11 1700  Gross per 24 hour  Intake    785 ml  Output    751 ml  Net     34 ml   Blood pressure 113/44, pulse 84, temperature 98 F (36.7 C), temperature source Axillary, resp. rate 23, height 5\' 8"  (1.727 m), weight 102.1 kg (225 lb 1.4 oz), SpO2 99.00%.  CBG (last 3)   Basename 07/03/11 1220 07/03/11 0757 07/02/11 2225  GLUCAP 133* 115* 126*   Physical Exam: General: No acute respiratory distress - no longer pale Lungs: Clear to auscultation bilaterally without wheezes or crackles - distant breath sounds Cardiovascular: irreg irreg w/o gallup or rub - no appreciable M Abdomen: soft, non-tender, BS +, non-distended - ileal conduit in RLQ- clear urine in foley bag  Extremities: 2+  LE edema w/o cyanosis or clubbing  Lab Results:  Basename 07/03/11 0416 07/01/11 0420  NA 141 141  K 3.2* 3.7  CL 108 108  CO2 19 25  GLUCOSE 121* 103*  BUN 21 17    CREATININE 1.36* 1.01  CALCIUM 8.4 8.4  MG -- --  PHOS -- --   No results found for this basename: AST:2,ALT:2,ALKPHOS:2,BILITOT:2,PROT:2,ALBUMIN:2 in the last 72 hours  Basename 07/03/11 0416 07/02/11 1853 07/02/11 0405  WBC 15.3* 16.7* 6.9  NEUTROABS -- -- --  HGB 8.0* 8.5* 8.0*  HCT 23.6* 25.4* 23.9*  MCV 87.4 87.9 88.8  PLT 151 161 128*   Micro Results: Recent Results (from the past 240 hour(s))  URINE CULTURE     Status: Normal   Collection Time   06/26/11  4:52 PM      Component Value Range Status Comment   Specimen Description URINE, CLEAN CATCH   Final    Special Requests NONE   Final    Culture  Setup Time [id number]   Final    Colony Count >=100,000 COLONIES/ML   Final    Culture     Final    Value: Multiple bacterial morphotypes present, none predominant. Suggest appropriate recollection if clinically indicated.   Report Status 06/28/2011 FINAL   Final   CULTURE, BLOOD (ROUTINE X 2)     Status: Normal   Collection Time   06/26/11  7:53 PM      Component Value Range Status Comment   Specimen Description BLOOD LEFT ARM   Final    Special Requests BOTTLES DRAWN AEROBIC AND ANAEROBIC 10CC   Final  Culture  Setup Time [id number]   Final    Culture NO GROWTH 5 DAYS   Final    Report Status 07/03/2011 FINAL   Final   CULTURE, BLOOD (ROUTINE X 2)     Status: Normal   Collection Time   06/26/11  8:02 PM      Component Value Range Status Comment   Specimen Description BLOOD LEFT HAND   Final    Special Requests BOTTLES DRAWN AEROBIC AND ANAEROBIC 10CC   Final    Culture  Setup Time [id number]   Final    Culture NO GROWTH 5 DAYS   Final    Report Status 07/03/2011 FINAL   Final   MRSA PCR SCREENING     Status: Normal   Collection Time   06/27/11  5:52 AM      Component Value Range Status Comment   MRSA by PCR NEGATIVE  NEGATIVE  Final   CLOSTRIDIUM DIFFICILE BY PCR     Status: Abnormal   Collection Time   07/03/11 12:47 AM      Component Value Range Status  Comment   C difficile by pcr POSITIVE (*) NEGATIVE  Final    Studies/Results: All recent x-ray/radiology reports have been reviewed in detail.   Medications: I have reviewed the patient's complete medication list.  Assessment/Plan:  C. Diff colitis Started PO flagyl but due to diarrhea, it is likely not being absorbed. Switched to IV Flagyl.   hypovolemic hypotension Present on admission and resolved with IVF.  Stopped hydration and resumed lasix as this had resolved but now resuming fluids and stopping lasix due to severity of diarrhea.   Melena - UGIB - hx of gastric varices Has had repeat EGD-  APC tx of AVMs  Follow stools and CBC- no blood noted on EGD Daily ppi, low iron stores, f/u in GI clinic - may need further eval of a duodenal submucosal nodule.   Coagulopathy Pradaxa is known to interfere with the INR assay, but wether or not this "falsely elevated" INR is indicative of a true PT related coagulopathy is a matter of debate - nonetheless, in this situation, we have tx the pt aggressively with FFP and Vitamin K due to his active bleeding - he is, of course, off the pradaxa as well  Repeat coags reveal only a very mild elevation- PT 15.7, INR- 1.22.   Acute blood loss anemia and iron deffiency  Due to GIB - baseline Hgb was 11.3 ~1 week ago - Hgb is now holding steady/improving s/p 6 units total of PRBC infused IV Iron replacement due to severe deffiency   Hypernatremia Resolved- was secondary to NS  UTI (lower urinary tract infection) vs/ colonization (illeal conduit)  d/c Cipro as culture reveal multiple morphotypes and is likely a colonization  Hematuria - bladder cancer s/p ileal conduit Mild/minimal - likely oozing due to coagulopathy resolved Remove foley bag today.   cryptogenic cirrhosis Followed by Dr Russella Dar - gastric varices confirmed on EGD but no evidence of bleeding varices - ultrasound to evaluate for splenic vein thrombosis was negative.    Hypoglycemia in DM2 Due to meds + ARI - stable for now   AKI (acute kidney injury) Likely pre-renal due to hypotension due to acute anemia - resolved w/ volume resuscitation  A-fib Was on pradaxa prior to admit - is coagulopathic w/ active GIB, so clearly can't cont pradaxa for now - likely will not be safe to resume pradaxa at d/c   Diastolic CHF -  grade 1 Well compensated at present - stopping IVF  O2 dependent COPD Well compensated at present   Dispo Remain in SDU   Calvert Cantor, MD [facility] Office  [phone] Pager [phone]  On-Call/Text Page:      [email or web address]      password [facility]

## 2011-07-04 DIAGNOSIS — R197 Diarrhea, unspecified: Secondary | ICD-10-CM

## 2011-07-04 DIAGNOSIS — D684 Acquired coagulation factor deficiency: Secondary | ICD-10-CM

## 2011-07-04 DIAGNOSIS — E871 Hypo-osmolality and hyponatremia: Secondary | ICD-10-CM

## 2011-07-04 DIAGNOSIS — K922 Gastrointestinal hemorrhage, unspecified: Secondary | ICD-10-CM

## 2011-07-04 LAB — BASIC METABOLIC PANEL
CO2: 21 mEq/L (ref 19–32)
Calcium: 8.5 mg/dL (ref 8.4–10.5)
Creatinine, Ser: 1.13 mg/dL (ref 0.50–1.35)
GFR calc non Af Amer: 61 mL/min — ABNORMAL LOW (ref >90)
Glucose, Bld: 102 mg/dL — ABNORMAL HIGH (ref 70–99)

## 2011-07-04 LAB — CBC
HCT: 24.2 % — ABNORMAL LOW (ref 39.0–52.0)
Hemoglobin: 7.4 g/dL — ABNORMAL LOW (ref 13.0–17.0)
Hemoglobin: 7.9 g/dL — ABNORMAL LOW (ref 13.0–17.0)
MCH: 29 pg (ref 26.0–34.0)
MCHC: 33 g/dL (ref 30.0–36.0)
MCV: 87.8 fL (ref 78.0–100.0)
RBC: 2.55 MIL/uL — ABNORMAL LOW (ref 4.22–5.81)
RBC: 2.72 MIL/uL — ABNORMAL LOW (ref 4.22–5.81)
WBC: 6.7 10*3/uL (ref 4.0–10.5)

## 2011-07-04 LAB — GLUCOSE, CAPILLARY
Glucose-Capillary: 120 mg/dL — ABNORMAL HIGH (ref 70–99)
Glucose-Capillary: 120 mg/dL — ABNORMAL HIGH (ref 70–99)

## 2011-07-04 LAB — PREPARE RBC (CROSSMATCH)

## 2011-07-04 NOTE — Progress Notes (Signed)
Will transfuse 1 unit PRBC now.  Repeat CBC tomorrow.   Calvert Cantor, MD [phone]

## 2011-07-04 NOTE — Progress Notes (Signed)
[facility]  TEAM 1 - Stepdown/ICU TEAM  PCP:  Eustaquio Boyden, MD, MD  Subjective: [phone] admitted with complaints of generalized malaise and dizzinness associated with chills, and poor appetite ongoing for 2 days.  In the ED his BP was in the 70s at presentation, but responded to fluid challenges into the low 100s systolic, occasionally in the 35T. His renal function has taken a turn for the worse, from a baseline creatinine of 1.2 in December 2012 to 2.65 at presentation.  Of note Mr Kier was hospitalized at [facility] a week ago (5/18-21/2013)  for an acute on chronic respiratory failure/chf decompensation. He has continued to take hypoglycemics and BP meds as prescribed, including metformin / pradaxa / amaryl / zocor / losartan / coreg / lasix, which may be contributing to the ARF/hypoglycemia and hypotension.   He has had about 10 BMs between 1 am and now. Stools are not bloody. He is still eating well. No complaints of dizziness or shortness of breath    Objective:  Intake/Output Summary (Last 24 hours) at 07/04/11 1417 Last data filed at 07/04/11 1408  Gross per 24 hour  Intake   1810 ml  Output   1775 ml  Net     35 ml   Blood pressure 111/51, pulse 71, temperature 98.3 F (36.8 C), temperature source Oral, resp. rate 24, height 5\' 8"  (1.727 m), weight 102.1 kg (225 lb 1.4 oz), SpO2 100.00%.  CBG (last 3)   Basename 07/04/11 1218 07/04/11 0755 07/03/11 2204  GLUCAP 120* 100* 124*   Physical Exam: General: No acute respiratory distress - no longer pale Lungs: Clear to auscultation bilaterally without wheezes or crackles Cardiovascular: irreg irreg w/o gallup or rub - no appreciable M Abdomen: soft, non-tender, BS +, non-distended - ileal conduit in RLQ- clear urine in foley bag  Extremities: stable 2+  LE edema w/o cyanosis or clubbing  Lab Results:  Basename 07/04/11 0405 07/03/11 0416  NA 139 141  K 4.0 3.2*  CL 110 108  CO2 21 19  GLUCOSE  102* 121*  BUN 18 21  CREATININE 1.13 1.36*  CALCIUM 8.5 8.4  MG -- --  PHOS -- --   No results found for this basename: AST:2,ALT:2,ALKPHOS:2,BILITOT:2,PROT:2,ALBUMIN:2 in the last 72 hours  Basename 07/04/11 0405 07/03/11 0416 07/02/11 1853  WBC 8.2 15.3* 16.7*  NEUTROABS -- -- --  HGB 7.4* 8.0* 8.5*  HCT 22.4* 23.6* 25.4*  MCV 87.8 87.4 87.9  PLT 134* 151 161   Micro Results: Recent Results (from the past 240 hour(s))  URINE CULTURE     Status: Normal   Collection Time   06/26/11  4:52 PM      Component Value Range Status Comment   Specimen Description URINE, CLEAN CATCH   Final    Special Requests NONE   Final    Culture  Setup Time [id number]   Final    Colony Count >=100,000 COLONIES/ML   Final    Culture     Final    Value: Multiple bacterial morphotypes present, none predominant. Suggest appropriate recollection if clinically indicated.   Report Status 06/28/2011 FINAL   Final   CULTURE, BLOOD (ROUTINE X 2)     Status: Normal   Collection Time   06/26/11  7:53 PM      Component Value Range Status Comment   Specimen Description BLOOD LEFT ARM   Final    Special Requests BOTTLES DRAWN AEROBIC AND ANAEROBIC 10CC   Final  Culture  Setup Time [id number]   Final    Culture NO GROWTH 5 DAYS   Final    Report Status 07/03/2011 FINAL   Final   CULTURE, BLOOD (ROUTINE X 2)     Status: Normal   Collection Time   06/26/11  8:02 PM      Component Value Range Status Comment   Specimen Description BLOOD LEFT HAND   Final    Special Requests BOTTLES DRAWN AEROBIC AND ANAEROBIC 10CC   Final    Culture  Setup Time [id number]   Final    Culture NO GROWTH 5 DAYS   Final    Report Status 07/03/2011 FINAL   Final   MRSA PCR SCREENING     Status: Normal   Collection Time   06/27/11  5:52 AM      Component Value Range Status Comment   MRSA by PCR NEGATIVE  NEGATIVE  Final   CLOSTRIDIUM DIFFICILE BY PCR     Status: Abnormal   Collection Time   07/03/11 12:47 AM      Component  Value Range Status Comment   C difficile by pcr POSITIVE (*) NEGATIVE  Final    Studies/Results: All recent x-ray/radiology reports have been reviewed in detail.   Medications: I have reviewed the patient's complete medication list.  Assessment/Plan:  C. Diff colitis Cont  IV Flagyl. Leukocytosis has resolved.   hypovolemic hypotension/Diastolic CHF - grade 1 Present on admission and resolved with IVF.  Stopped hydration and resumed lasix as this had resolved but once diarrhea started, I have had to resume slow fluids and stop lasix. He is receiving 1500 cc with the Flagyl as well.  Urine still quite dark but I and O are near even so far.   Chronic pedal edema TEDS  Melena - UGIB - hx of gastric varices Has had repeat EGD-  APC tx of AVMs  - no blood noted on EGD Daily ppi, f/u in GI clinic - may need further eval of a duodenal submucosal nodule as oupt.    Coagulopathy Pradaxa is known to interfere with the INR assay, but wether or not this "falsely elevated" INR is indicative of a true PT related coagulopathy is a matter of debate - nonetheless, in this situation, we have tx the pt aggressively with FFP and Vitamin K due to his active bleeding - he is, of course, off the pradaxa as well  Repeat coags reveal only a very mild elevation- PT 15.7, INR- 1.22.   Acute blood loss anemia and iron deffiency Due to GIB - baseline Hgb was 11.3 ~1 week ago - Hgb is now holding steady/improving s/p 6 units total of PRBC infused IV Iron replacement given due to severe defiency Will repeat Hgb today as it is down and he may need further blood- goal > 8    UTI (lower urinary tract infection) vs/ colonization (illeal conduit)  d/c Cipro as culture reveal multiple morphotypes and is likely a colonization  Hematuria - bladder cancer s/p ileal conduit Mild/minimal - likely oozing due to coagulopathy- resolved Removed foley bag  cryptogenic cirrhosis Followed by Dr Russella Dar - gastric varices  confirmed on EGD but no evidence of bleeding varices - ultrasound to evaluate for splenic vein thrombosis was negative.   Hypoglycemia in DM2 Due to meds + ARI - stable for now   AKI (acute kidney injury) Likely pre-renal due to hypotension due to acute anemia - resolved w/ volume resuscitation  A-fib Was on pradaxa  prior to admit - is coagulopathic w/ active GIB, so clearly can't cont pradaxa for now - likely will not be safe to resume pradaxa at d/c   O2 dependent COPD Well compensated at present   Dispo Remain in SDU   Calvert Cantor, MD [facility] Office  [phone] Pager [phone]  On-Call/Text Page:      [email or web address]      password [facility]

## 2011-07-05 ENCOUNTER — Encounter: Payer: PRIVATE HEALTH INSURANCE | Admitting: Cardiovascular Disease

## 2011-07-05 DIAGNOSIS — D684 Acquired coagulation factor deficiency: Secondary | ICD-10-CM

## 2011-07-05 DIAGNOSIS — R197 Diarrhea, unspecified: Secondary | ICD-10-CM

## 2011-07-05 DIAGNOSIS — E871 Hypo-osmolality and hyponatremia: Secondary | ICD-10-CM

## 2011-07-05 DIAGNOSIS — K922 Gastrointestinal hemorrhage, unspecified: Secondary | ICD-10-CM

## 2011-07-05 LAB — CBC
HCT: 25.5 % — ABNORMAL LOW (ref 39.0–52.0)
Hemoglobin: 8.4 g/dL — ABNORMAL LOW (ref 13.0–17.0)
RBC: 2.88 MIL/uL — ABNORMAL LOW (ref 4.22–5.81)
RDW: 16.2 % — ABNORMAL HIGH (ref 11.5–15.5)
WBC: 5.3 10*3/uL (ref 4.0–10.5)

## 2011-07-05 LAB — BASIC METABOLIC PANEL
BUN: 12 mg/dL (ref 6–23)
CO2: 19 mEq/L (ref 19–32)
Chloride: 114 mEq/L — ABNORMAL HIGH (ref 96–112)
GFR calc Af Amer: >90 mL/min (ref >90)
Glucose, Bld: 89 mg/dL (ref 70–99)
Potassium: 4.6 mEq/L (ref 3.5–5.1)

## 2011-07-05 LAB — GLUCOSE, CAPILLARY: Glucose-Capillary: 96 mg/dL (ref 70–99)

## 2011-07-05 MED ORDER — METRONIDAZOLE 500 MG PO TABS
500.0000 mg | ORAL_TABLET | Freq: Three times a day (TID) | ORAL | Status: DC
  Administered 2011-07-05 – 2011-07-06 (×3): 500 mg via ORAL
  Filled 2011-07-05 (×7): qty 1

## 2011-07-05 NOTE — Progress Notes (Signed)
Agree with d/c note.  Dyanna Seiter, PT DPT [phone]  

## 2011-07-05 NOTE — Progress Notes (Signed)
Physical Therapy Treatment Patient Details Name: [name] MRN: [id number] DOB: 1933-05-29 Today's Date: 07/05/2011 Time: [id number] PT Time Calculation (min): 22 min  PT Assessment / Plan / Recommendation Comments on Treatment Session  Patient progressing very well. States he is able to get in and out of bed on his own. Patient stated that he didn't walk much over the weekend due to frequent BMs. Patient encouraged to ambulate with family and staff. Patient has a ramp to enter his house and has 4 steps with rails. Patient declined praticing as he doesn't think getting into the house will be an issue. Patient very motivated to get well and return home.     Follow Up Recommendations  No PT follow up    Barriers to Discharge        Equipment Recommendations  None recommended by PT    Recommendations for Other Services    Frequency     Plan All goals met and education completed, patient dischaged from PT services    Precautions / Restrictions Precautions Precautions: None   Pertinent Vitals/Pain     Mobility  Bed Mobility Bed Mobility: Not assessed Transfers Sit to Stand: 6: Modified independent (Device/Increase time) Stand to Sit: 6: Modified independent (Device/Increase time) Ambulation/Gait Ambulation/Gait Assistance: 6: Modified independent (Device/Increase time) Ambulation Distance (Feet): 400 Feet Assistive device: None Gait Pattern: Within Functional Limits    Exercises     PT Diagnosis:    PT Problem List:   PT Treatment Interventions:     PT Goals Acute Rehab PT Goals PT Goal: Supine/Side to Sit - Progress: Other (comment) (patient and daughter reports no issues) PT Goal: Sit to Supine/Side - Progress: Other (comment) (patient and daughter reports no issues) PT Goal: Sit to Stand - Progress: Met PT Goal: Stand to Sit - Progress: Met PT Goal: Ambulate - Progress: Met PT Goal: Up/Down Stairs - Progress: Other (comment) (patient has ramp. declined practice of  stairs. )  Visit Information  Last PT Received On: 07/05/11 Assistance Needed: +1    Subjective Data  Subjective: I feel pretty good, Im just ready to get out of here   Cognition  Overall Cognitive Status: Appears within functional limits for tasks assessed/performed Arousal/Alertness: Awake/alert Orientation Level: Appears intact for tasks assessed Behavior During Session: [facility] for tasks performed    Balance     End of Session PT - End of Session Activity Tolerance: Patient tolerated treatment well Patient left: in chair;with call bell/phone within reach Nurse Communication: Mobility status    Fredrich Birks 07/05/2011, 8:21 AM 07/05/2011 Fredrich Birks PTA [phone] pager [phone] office

## 2011-07-05 NOTE — Progress Notes (Signed)
[facility] [place] TEAM 1 - Stepdown/ICU TEAM  PCP:  Eustaquio Boyden, MD, MD  Interval History: [phone] admitted with complaints of generalized malaise and dizzinness associated with chills, and poor appetite ongoing for 2 days.  In the ED his BP was in the 70s at presentation, but responded to fluid challenges into the low 100s systolic, occasionally in the 96E. His renal function has taken a turn for the worse, from a baseline creatinine of 1.2 in December 2012 to 2.65 at presentation.  Of note [name] was hospitalized at [facility] a week ago (5/18-21/2013)  for an acute on chronic respiratory failure/chf decompensation. He has continued to take hypoglycemics and BP meds as prescribed, including metformin / pradaxa / amaryl / zocor / losartan / coreg / lasix, which may be contributing to the ARF/hypoglycemia and hypotension.   Subjective: States previous frequent loose stools have resolved and basically down to 1 or 2 over the past 12 hours. Very eager to become more mobile and discharge soon. Currently up and out of bed to chair.    Objective:  Intake/Output Summary (Last 24 hours) at 07/05/11 1331 Last data filed at 07/05/11 1300  Gross per 24 hour  Intake   1765 ml  Output   1600 ml  Net    165 ml   Blood pressure 150/52, pulse 90, temperature 97.9 F (36.6 C), temperature source Oral, resp. rate 20, height 5\' 8"  (1.727 m), weight 107.2 kg (236 lb 5.3 oz), SpO2 98.00%.  CBG (last 3)   Basename 07/05/11 1219 07/05/11 0822 07/04/11 2148  GLUCAP 137* 90 109*   Physical Exam: General: No acute respiratory distress - no longer pale Lungs: Clear to auscultation bilaterally without wheezes or crackles Cardiovascular: irreg irreg w/o gallup or rub - no appreciable M Abdomen: soft, non-tender, BS +, non-distended - ileal conduit in RLQ- clear urine in foley bag  Extremities: stable 2+  LE edema w/o cyanosis or clubbing  Lab Results:  Basename 07/05/11 0405  07/04/11 0405 07/03/11 0416  NA 141 139 141  K 4.6 4.0 3.2*  CL 114* 110 108  CO2 19 21 19   GLUCOSE 89 102* 121*  BUN 12 18 21   CREATININE 0.93 1.13 1.36*  CALCIUM 8.4 8.5 8.4  MG -- -- --  PHOS -- -- --    Basename 07/05/11 0405 07/04/11 1400 07/04/11 0405  WBC 5.3 6.7 8.2  NEUTROABS -- -- --  HGB 8.4* 7.9* 7.4*  HCT 25.5* 24.2* 22.4*  MCV 88.5 89.0 87.8  PLT 133* 138* 134*   Micro Results: Recent Results (from the past 240 hour(s))  URINE CULTURE     Status: Normal   Collection Time   06/26/11  4:52 PM      Component Value Range Status Comment   Specimen Description URINE, CLEAN CATCH   Final    Special Requests NONE   Final    Culture  Setup Time [id number]   Final    Colony Count >=100,000 COLONIES/ML   Final    Culture     Final    Value: Multiple bacterial morphotypes present, none predominant. Suggest appropriate recollection if clinically indicated.   Report Status 06/28/2011 FINAL   Final   CULTURE, BLOOD (ROUTINE X 2)     Status: Normal   Collection Time   06/26/11  7:53 PM      Component Value Range Status Comment   Specimen Description BLOOD LEFT ARM   Final    Special Requests  BOTTLES DRAWN AEROBIC AND ANAEROBIC 10CC   Final    Culture  Setup Time [id number]   Final    Culture NO GROWTH 5 DAYS   Final    Report Status 07/03/2011 FINAL   Final   CULTURE, BLOOD (ROUTINE X 2)     Status: Normal   Collection Time   06/26/11  8:02 PM      Component Value Range Status Comment   Specimen Description BLOOD LEFT HAND   Final    Special Requests BOTTLES DRAWN AEROBIC AND ANAEROBIC 10CC   Final    Culture  Setup Time [id number]   Final    Culture NO GROWTH 5 DAYS   Final    Report Status 07/03/2011 FINAL   Final   MRSA PCR SCREENING     Status: Normal   Collection Time   06/27/11  5:52 AM      Component Value Range Status Comment   MRSA by PCR NEGATIVE  NEGATIVE  Final   CLOSTRIDIUM DIFFICILE BY PCR     Status: Abnormal   Collection Time   07/03/11 12:47 AM       Component Value Range Status Comment   C difficile by pcr POSITIVE (*) NEGATIVE  Final    Studies/Results: All recent x-ray/radiology reports have been reviewed in detail.   Medications: I have reviewed the patient's complete medication list.  Assessment/Plan:  C. Diff colitis *Cont Flagyl but change to oral route.  *leukocytosis and diarrhea have essentially resolved.   hypovolemic hypotension in setting of Diastolic CHF - grade 1 *Present on admission and resolved with IVF *Earlier this problem had resolved and patient was resumed on his usual Lasix but with onset of diarrhea Lasix was once again stopped and IV fluids resumed *Now diarrhea has resolved so will convert IV fluids to normal saline lock but hold on Lasix for now  Chronic pedal edema *TEDS-would not use diuretics to treat pedal edema unless very severe  Melena - UGIB - hx of gastric varices *Has had repeat EGD-  APC tx of AVMs  *no blood noted on EGD *Daily ppi, f/u in GI clinic - may need further eval of a duodenal submucosal nodule as oupt.    Coagulopathy *Pradaxa is known to interfere with the INR assay, but wether or not this "falsely elevated" INR is indicative of a true PT related coagulopathy is a matter of debate - nonetheless, in this situation, we opted to tx the pt aggressively with FFP and Vitamin K due to his active bleeding - he is, of course, off the pradaxa as well  *Repeat coags reveal only a very mild elevation- PT 15.7, INR- 1.22. So we will go ahead and repeat again in the morning  Acute blood loss anemia and severe iron deficiency/? B12 deficiency *Due to GIB - baseline Hgb was 11.3 ~1 week ago - Hgb is now holding steady/improving s/p 6 units total of PRBC infused *IV Iron replacement given due to severe deficiency-last iron level was checked 07/02/2011 and was 21 *Repeat an anemia panel prior to discharge-also need to clarify B12 level. 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 - of the 6 units of PRBC he has received, 3 units were + for Duffy A ag - as a result,  we can expect that those three units will likely be hemolyzed in a subacute fashion - the patients Hgb will need to be followed even after d/c to assure that his marrow is able to keep up with the loss of this transfused blood  UTI (lower urinary tract infection) vs/ colonization (illeal conduit)  *d/c Cipro as culture reveal multiple morphotypes and is likely a colonization  Hematuria - bladder cancer s/p ileal conduit *Mild/minimal - likely oozing due to coagulopathy- resolved *Removed foley bag  cryptogenic cirrhosis *Followed by Dr Russella Dar - gastric varices confirmed on EGD but no evidence of bleeding varices  *Abdominal ultrasound to evaluate for splenic vein thrombosis was negative.   Hypoglycemia in DM2 *Due to meds + ARI - stable for now  *On Amaryl and metformin prior to admission *Continue sliding scale insulin for now  AKI (acute kidney injury) *Resolved *Likely pre-renal due to hypotension due to acute anemia - resolved w/ volume resuscitation  A-fib *Was on pradaxa prior to admit - is coagulopathic w/ active GIB, so clearly can't cont pradaxa for now - likely will not be safe to resume pradaxa at d/c  *Continue carvedilol for rate control  O2 dependent COPD *Well compensated at present   Dispo *Remain in SDU - possible d/c in next 24 hours  Lonia Blood, MD [facility] Office  [phone] Pager [phone]  On-Call/Text Page:      [email or web address]      password [facility]

## 2011-07-06 DIAGNOSIS — E871 Hypo-osmolality and hyponatremia: Secondary | ICD-10-CM

## 2011-07-06 DIAGNOSIS — A0472 Enterocolitis due to Clostridium difficile, not specified as recurrent: Secondary | ICD-10-CM | POA: Diagnosis not present

## 2011-07-06 DIAGNOSIS — K922 Gastrointestinal hemorrhage, unspecified: Secondary | ICD-10-CM

## 2011-07-06 DIAGNOSIS — R197 Diarrhea, unspecified: Secondary | ICD-10-CM

## 2011-07-06 DIAGNOSIS — K7469 Other cirrhosis of liver: Secondary | ICD-10-CM | POA: Diagnosis present

## 2011-07-06 DIAGNOSIS — D684 Acquired coagulation factor deficiency: Secondary | ICD-10-CM

## 2011-07-06 DIAGNOSIS — K3189 Other diseases of stomach and duodenum: Secondary | ICD-10-CM | POA: Clinically undetermined

## 2011-07-06 DIAGNOSIS — R768 Other specified abnormal immunological findings in serum: Secondary | ICD-10-CM | POA: Clinically undetermined

## 2011-07-06 HISTORY — DX: Enterocolitis due to Clostridium difficile, not specified as recurrent: A04.72

## 2011-07-06 LAB — BASIC METABOLIC PANEL
Calcium: 8.5 mg/dL (ref 8.4–10.5)
Creatinine, Ser: 0.9 mg/dL (ref 0.50–1.35)
GFR calc Af Amer: >90 mL/min (ref >90)
Sodium: 141 mEq/L (ref 135–145)

## 2011-07-06 LAB — TRANSFUSION REACTION: DAT C3: NEGATIVE

## 2011-07-06 LAB — CBC
MCH: 29 pg (ref 26.0–34.0)
MCV: 88.6 fL (ref 78.0–100.0)
Platelets: 143 10*3/uL — ABNORMAL LOW (ref 150–400)
RDW: 16.4 % — ABNORMAL HIGH (ref 11.5–15.5)
WBC: 4.4 10*3/uL (ref 4.0–10.5)

## 2011-07-06 MED ORDER — METFORMIN HCL 500 MG PO TABS: 500.0000 mg | ORAL_TABLET | Freq: Two times a day (BID) | ORAL | Status: DC

## 2011-07-06 MED ORDER — FERROUS SULFATE 325 (65 FE) MG PO TABS: 325.0000 mg | ORAL_TABLET | Freq: Every day | ORAL | Status: DC

## 2011-07-06 MED ORDER — METRONIDAZOLE 500 MG PO TABS: 500.0000 mg | ORAL_TABLET | Freq: Three times a day (TID) | ORAL | Status: DC

## 2011-07-06 MED ORDER — CARVEDILOL 6.25 MG PO TABS: 6.2500 mg | ORAL_TABLET | Freq: Two times a day (BID) | ORAL | Status: DC

## 2011-07-06 NOTE — Discharge Summary (Signed)
DISCHARGE SUMMARY  [name]  MR#: [id number]  DOB:07-04-1933  Date of Admission: 06/26/2011 Date of Discharge: 07/06/2011  Attending Physician:Jamillia Closson  Patient's WNU:[id number] Sharen Hones, MD, MD  Consults:  GI with [place]   Presenting Complaint: weakness  Discharge Diagnoses: Principal Problem:  *Hypotension due to blood loss Active Problems:  GI bleed  Acute blood loss anemia  Gastric and duodenal angiodysplasia  COPD - on 2-3 L O2 chornically  Hematuria  Hypoglycemia  AKI (acute kidney injury)  A-fib  Current use of long term anticoagulation  Diastolic CHF  Gastric varices  Cirrhosis, cryptogenic  C. difficile colitis  Anti-Duffy a antibodies present  Duodenal nodule    Discharge Medications: Medication List  As of 07/06/2011 10:43 AM   STOP taking these medications         aspirin 81 MG EC tablet      dabigatran 150 MG Caps      glimepiride 2 MG tablet         TAKE these medications         albuterol-ipratropium 18-103 MCG/ACT inhaler   Commonly known as: COMBIVENT   Inhale 2 puffs into the lungs every 6 (six) hours as needed. For wheezing      carvedilol 6.25 MG tablet   Commonly known as: COREG   Take 1 tablet (6.25 mg total) by mouth 2 (two) times daily with a meal.      ferrous sulfate 325 (65 FE) MG tablet   Take 1 tablet (325 mg total) by mouth daily with breakfast.      fluticasone 110 MCG/ACT inhaler   Commonly known as: FLOVENT HFA   Inhale 2 puffs into the lungs 2 (two) times daily.      furosemide 40 MG tablet   Commonly known as: LASIX   Take 20 mg by mouth 2 (two) times daily.      losartan 100 MG tablet   Commonly known as: COZAAR   Take 100 mg by mouth daily.      Magnesium 400 MG Caps   Take 400 mg by mouth daily.      metFORMIN 500 MG tablet   Commonly known as: GLUCOPHAGE   Take 1 tablet (500 mg total) by mouth 2 (two) times daily with a meal.      metroNIDAZOLE 500 MG tablet   Commonly known as: FLAGYL   Take 1  tablet (500 mg total) by mouth every 8 (eight) hours.      Nebulizer Devi   by Does not apply route. Use as directed      ONE TOUCH LANCETS Misc   One daily and as needed/ ICD code 250.00      ONE TOUCH ULTRA TEST test strip   Generic drug: glucose blood   TEST BLOOD SUGAR ONCE DAILY AS DIRECTED      potassium chloride SA 20 MEQ tablet   Commonly known as: K-DUR,KLOR-CON   Take 20 mEq by mouth daily.      simvastatin 20 MG tablet   Commonly known as: ZOCOR   Take 20 mg by mouth at bedtime.      tiotropium 18 MCG inhalation capsule   Commonly known as: SPIRIVA   Place 18 mcg into inhaler and inhale daily.      Vitamin B-12 2500 MCG Subl   Place 1 tablet under the tongue daily.             Procedures: Dg Chest 2 View  06/26/2011  *RADIOLOGY REPORT*  Clinical  Data: Nonproductive cough for the past 2 months.  Ex- smoker.  CHEST - 2 VIEW  Comparison: None.  Findings: Borderline enlarged cardiac silhouette.  Small amount of linear density at the right lung base.  Otherwise, clear lungs. The lungs are hyperexpanded with diffuse peribronchial thickening and mild prominence of the interstitial markings.  Diffuse osteopenia.  Mild thoracic spine degenerative changes.  IMPRESSION:  1.  Mild right basilar linear atelectasis or scarring. 2.  Changes of COPD and chronic bronchitis. 3.  Borderline cardiomegaly.  Original Report Authenticated By: Darrol Angel, M.D.   US Renal  06/26/2011  *RADIOLOGY REPORT*  Clinical Data: Acute renal failure.  Hematuria.  Post cystectomy for cancer.  RENAL/URINARY TRACT ULTRASOUND COMPLETE  Comparison:  CT abdomen and pelvis 10/28/2006  Findings:  Right Kidney:  The right kidney measures 11.3 cm length.  There is a cyst in the midpole of the kidney measuring 4.7 x 4 x 4.7 cm. This appears to be a simple cyst and was present on the previous CT scan.  No solid mass or hydronephrosis appreciated.  Mild parenchymal atrophy.  Left Kidney:  The left kidney measures  12.2 cm length.  No focal mass or hydronephrosis.  Mild parenchymal atrophy.  Bladder:  The bladder is surgically absent and the area was not evaluated.  IMPRESSION: Mild parenchymal atrophy in both kidneys.  No evidence of hydronephrosis.  Simple appearing cyst on the right kidney.  Original Report Authenticated By: Marlon Pel, M.D.   [place] Art/ven Flow Abd Pelv Doppler  06/30/2011  *RADIOLOGY REPORT*  Clinical Data: History of gastric varices and cryptogenic cirrhosis.  DUPLEX ULTRASOUND OF LIVER  Technique:  Color and duplex Doppler ultrasound was performed to evaluate the hepatic in-flow and out-flow vessels.  Comparison:  None.  Portal Vein Velocities: Main:      34 cm/sec Right:      19 cm/sec Left:        25 cm/sec  Hepatic Vein Velocities: Right:     49 cm/sec Middle:  56 cm/sec Left:       42 cm/sec  Hepatic Artery Velocity:   90 cm/sec Splenic Vein Velocity:      41 cm/sec  The spleen is enlarged with estimated dimensions of 13.2 x 17.1 x 5.9 cm.  Estimated splenic volume is 690 ml.  Varices:   None visualized by ultrasound. Ascites:   None visualized.  Findings: There is no evidence of splenic or portal vein thrombosis.  Portal vein velocities and flow direction are normal. There is no evidence of hepatic venoocclusive disease.  IMPRESSION: Normal patency of the splenic vein, portal vein and hepatic veins without abnormal portal velocities or abnormal direction of portal vein flow.  There is evidence of moderate splenomegaly.  Original Report Authenticated By: Reola Calkins, M.D.    echocardiogram  Hospital Course: [phone] admitted with complaints of generalized malaise and dizzinness associated with chills, and poor appetite ongoing for 2 days. In the ED [name] BP was in the 70s at presentation, but responded to fluid challenges into the low 100s systolic, occasionally in the 96E. [name] renal function had taken a turn for the worse, from a baseline creatinine of 1.2 in December 2012 to 2.65 at  presentation.  Since [name] admit, the pt has been noted to be having melanotic stools. He was also found to have a marked anemia. He admited that he had been having similar dark/black stools for "a few days or more" at home.   Principal  Problem:  *Hypotension due to blood loss  GI bleed- melena  Acute blood loss anemia  Gastric and duodenal angiodysplasia [name] hemoglobin was noted to be 6.4 and INR 3.03. [name] ASA and Pradaxa were held.  2 Units PRBC were given. EGD performed on 6/3- ENDOSCOPIC IMPRESSION:  1) Normal esophagus  2) Gastric varices in the fundus without stigmata of recent  bleeding  3) Multiple small, non-bleeding angioectasias in the cardia,  body and incisura.  4) Mild stenosis in the pyloric channel, but able to be  traversed with mild resistance  5) Normal duodenum  RECOMMENDATIONS:  1) Continue pRBC transfusion with close monitoring of Hgb/HCT  2) Can discontinue octreotide, but would continue PPI gtt until  tomorrow.  3) Monitor coags closely.  4) Consider repeat EGD once Hgb and INR/coagulation profile  stable for APC of angioectasias.  5) Given isolated gastric varices, would rule out splenic vein  Thrombosis. DR Pyrtle planned to rescope at a later day to treat the AVMs with APC.   Due to continued bleeding, he received another unit of blood- stool remained melanotic.  Repeat EGD done on 6/6  ENDOSCOPIC IMPRESSION:  1) Normal esophagus  2) Gastric varices in the fundus. Nonbleeding.  3) Angioectasias in the total stomach treated successfully with  APC.  4) Normal duodenum  5) Submucosal nodule in the bulb of duodenum.  RECOMMENDATIONS:  1) Continue daily PPI  2) Monitor Hgb/HCT and iron stores to ensure normalization.  3) GI clinic follow-up after discharge  4) The need for possible further evaluation of submucosal nodule  in duodenum can be discussed in follow-up.  Black stools eventually stopped on 5/8 and have not recurred  C. Diff  colitis Unfortunately the day he stopped passing black stool, stool became brown with mucous and more frequent. 10 or mor BMs / day. C. Diff was found to be positive. He received 2 days of IV flagyl which was transitioned to PO Flagyl on 6/10. Stools have decreased in frequency to 2-3 in a 24 hr period. He has been tolerating a regular diet since the beginning of this episode.   Hypovolemic hypotension  Present on admission and resolved with IVF.  Stopped hydration and resumed lasix as this had resolved but due to profused diarrhea, hydration once again was resumed. He is now being discahrged home on [name] usual dose of Lasix. Coreg has been signifiacntly decreased due to hypotension. He needs a f/u BP at [name] doctors office. He has an appt for 2 days from now.   Coagulopathy  Pradaxa is known to interfere with the INR assay, but wether or not this "falsely elevated" INR is indicative of a true PT related coagulopathy is a matter of debate - nonetheless, in this situation, we have treated the pt aggressively with FFP and Vitamin K due to [name] active bleeding -   Acute blood loss anemia and iron deffiency  IV Iron replacement was given due to severe deffiency - [name] PO  Iron has been increased to daily from QOD.   Hematuria - bladder cancer s/p ileal conduit  Mild/minimal - likely oozing due to coagulopathy resolved   cryptogenic cirrhosis  Followed by Dr Russella Dar - gastric varices confirmed on EGD but no evidence of bleeding varices -  ultrasound to evaluate for splenic vein thrombosis was negative.   Hypoglycemia in DM2  Due to meds + ARF Holding Amaryl and cutting back on Metformin on d/c as [name] sugars are very well controlled on a diabetic  diet. Pt admits he was not adhering to a diabetic diet at home  AKI (acute kidney injury)  Likely pre-renal due to hypotension due to acute anemia - resolved w/ volume resuscitation   A-fib  Was on pradaxa prior to admit - due to  GIB clearly can't continue  pradaxa for now   Diastolic CHF - grade 1  Well compensated at present - back to [name] baseline weight on d/c- 220 lbs  O2 dependent COPD  Well compensated during the hospital stay     Day of Discharge Physical Exam: BP 130/54  Pulse 79  Temp(Src) 98.4 F (36.9 C) (Oral)  Resp 23  Ht 5\' 8"  (1.727 m)  Wt 100.1 kg (220 lb 10.9 oz)  BMI 33.55 kg/m2  SpO2 99% Physical Exam:  General: No acute respiratory distress - no longer pale  Lungs: Clear to auscultation bilaterally without wheezes or crackles Cardiovascular: irreg irreg w/o gallup or rub - no appreciable M  Abdomen: soft, non-tender, BS +, non-distended - ileal conduit in RLQ- bag reveals clear urine Extremities: persistant 2+ LE edema w/o cyanosis or clubbing- has TEDS on   Results for orders placed during the hospital encounter of 06/26/11 (from the past 24 hour(s))  GLUCOSE, CAPILLARY     Status: Abnormal   Collection Time   07/05/11 12:19 PM      Component Value Range   Glucose-Capillary 137 (*) 70 - 99 (mg/dL)  GLUCOSE, CAPILLARY     Status: Abnormal   Collection Time   07/05/11  4:23 PM      Component Value Range   Glucose-Capillary 143 (*) 70 - 99 (mg/dL)  GLUCOSE, CAPILLARY     Status: Normal   Collection Time   07/05/11 10:03 PM      Component Value Range   Glucose-Capillary 96  70 - 99 (mg/dL)   Comment 1 Documented in Chart     Comment 2 Notify RN    CBC     Status: Abnormal   Collection Time   07/06/11  3:38 AM      Component Value Range   WBC 4.4  4.0 - 10.5 (K/uL)   RBC 2.90 (*) 4.22 - 5.81 (MIL/uL)   Hemoglobin 8.4 (*) 13.0 - 17.0 (g/dL)   HCT 46.9 (*) 62.9 - 52.0 (%)   MCV 88.6  78.0 - 100.0 (fL)   MCH 29.0  26.0 - 34.0 (pg)   MCHC 32.7  30.0 - 36.0 (g/dL)   RDW 52.8 (*) 41.3 - 15.5 (%)   Platelets 143 (*) 150 - 400 (K/uL)  BASIC METABOLIC PANEL     Status: Abnormal   Collection Time   07/06/11  3:38 AM      Component Value Range   Sodium 141  135 - 145 (mEq/L)   Potassium 4.2  3.5 - 5.1  (mEq/L)   Chloride 111  96 - 112 (mEq/L)   CO2 20  19 - 32 (mEq/L)   Glucose, Bld 93  70 - 99 (mg/dL)   BUN 10  6 - 23 (mg/dL)   Creatinine, Ser 2.44  0.50 - 1.35 (mg/dL)   Calcium 8.5  8.4 - 01.0 (mg/dL)   GFR calc non Af Amer 81 (*) >90 (mL/min)   GFR calc Af Amer >90  >90 (mL/min)  GLUCOSE, CAPILLARY     Status: Normal   Collection Time   07/06/11  8:08 AM      Component Value Range   Glucose-Capillary 92  70 - 99 (  mg/dL)   Comment 1 Documented in Chart     Comment 2 Notify RN      Disposition: stable - will need close f/u as oupt   Follow-up Appts: Discharge Orders    Future Appointments: Provider: Department: Dept Phone: Center:   07/08/2011 3:00 PM Eustaquio Boyden, MD [facility] [phone] [id number]   08/02/2011 8:55 AM [facility] [place] [phone] [facility]   08/06/2011 2:45 PM Eustaquio Boyden, MD [facility] [phone] [facility]   08/09/2011 11:15 AM Meryl Dare, MD,FACG [facility] Office [phone] [facility]     Future Orders Please Complete By Expires   Diet - low sodium heart healthy and diabetic- carb controlled     Increase activity slowly      Discharge instructions      Comments:   Please see PCP in 2 days - have him check BP as we are resuming your BP meds and Lasix. He will need to repeat CBC and B met but he can wait 5-7 days before doing this.      Follow-up as above. Needs f/u of a Duodenal nodule- GI is aware of this.    Time on Discharge: >45 min  Signed: Xzaiver Vayda 07/06/2011, 10:43 AM

## 2011-07-06 NOTE — Progress Notes (Signed)
DISCHARGE SUMMARY  [name]  MR#: [id number]  DOB:June 27, 1933  Date of Admission: 06/26/2011 Date of Discharge: 07/06/2011  Attending Physician:Jaedyn Lard  Patient's WUJ:[id number] Sharen Hones, MD, MD  Consults:  GI with    Presenting Complaint: weakness  Discharge Diagnoses: Principal Problem:  *Hypotension due to blood loss Active Problems:  GI bleed  Acute blood loss anemia  Gastric and duodenal angiodysplasia  COPD - on 2-3 L O2 chornically  Hematuria  Hypoglycemia  AKI (acute kidney injury)  A-fib  Current use of long term anticoagulation  Diastolic CHF  Gastric varices  Cirrhosis, cryptogenic  C. difficile colitis  Anti-Duffy a antibodies present  Duodenal nodule    Discharge Medications: Medication List  As of 07/06/2011 10:39 AM   STOP taking these medications         aspirin 81 MG EC tablet      dabigatran 150 MG Caps      glimepiride 2 MG tablet         TAKE these medications         albuterol-ipratropium 18-103 MCG/ACT inhaler   Commonly known as: COMBIVENT   Inhale 2 puffs into the lungs every 6 (six) hours as needed. For wheezing      carvedilol 6.25 MG tablet   Commonly known as: COREG   Take 1 tablet (6.25 mg total) by mouth 2 (two) times daily with a meal.      ferrous sulfate 325 (65 FE) MG tablet   Take 1 tablet (325 mg total) by mouth daily with breakfast.      fluticasone 110 MCG/ACT inhaler   Commonly known as: FLOVENT HFA   Inhale 2 puffs into the lungs 2 (two) times daily.      furosemide 40 MG tablet   Commonly known as: LASIX   Take 20 mg by mouth 2 (two) times daily.      losartan 100 MG tablet   Commonly known as: COZAAR   Take 100 mg by mouth daily.      Magnesium 400 MG Caps   Take 400 mg by mouth daily.      metFORMIN 500 MG tablet   Commonly known as: GLUCOPHAGE   Take 1 tablet (500 mg total) by mouth 2 (two) times daily with a meal.      metroNIDAZOLE 500 MG tablet   Commonly known as: FLAGYL   Take 1  tablet (500 mg total) by mouth every 8 (eight) hours.      Nebulizer Devi   by Does not apply route. Use as directed      ONE TOUCH LANCETS Misc   One daily and as needed/ ICD code 250.00      ONE TOUCH ULTRA TEST test strip   Generic drug: glucose blood   TEST BLOOD SUGAR ONCE DAILY AS DIRECTED      potassium chloride SA 20 MEQ tablet   Commonly known as: K-DUR,KLOR-CON   Take 20 mEq by mouth daily.      simvastatin 20 MG tablet   Commonly known as: ZOCOR   Take 20 mg by mouth at bedtime.      tiotropium 18 MCG inhalation capsule   Commonly known as: SPIRIVA   Place 18 mcg into inhaler and inhale daily.      Vitamin B-12 2500 MCG Subl   Place 1 tablet under the tongue daily.             Procedures: Dg Chest 2 View  06/26/2011  *RADIOLOGY REPORT*  Clinical  Data: Nonproductive cough for the past 2 months.  Ex- smoker.  CHEST - 2 VIEW  Comparison: None.  Findings: Borderline enlarged cardiac silhouette.  Small amount of linear density at the right lung base.  Otherwise, clear lungs. The lungs are hyperexpanded with diffuse peribronchial thickening and mild prominence of the interstitial markings.  Diffuse osteopenia.  Mild thoracic spine degenerative changes.  IMPRESSION:  1.  Mild right basilar linear atelectasis or scarring. 2.  Changes of COPD and chronic bronchitis. 3.  Borderline cardiomegaly.  Original Report Authenticated By: Darrol Angel, M.D.   US Renal  06/26/2011  *RADIOLOGY REPORT*  Clinical Data: Acute renal failure.  Hematuria.  Post cystectomy for cancer.  RENAL/URINARY TRACT ULTRASOUND COMPLETE  Comparison:  CT abdomen and pelvis 10/28/2006  Findings:  Right Kidney:  The right kidney measures 11.3 cm length.  There is a cyst in the midpole of the kidney measuring 4.7 x 4 x 4.7 cm. This appears to be a simple cyst and was present on the previous CT scan.  No solid mass or hydronephrosis appreciated.  Mild parenchymal atrophy.  Left Kidney:  The left kidney measures  12.2 cm length.  No focal mass or hydronephrosis.  Mild parenchymal atrophy.  Bladder:  The bladder is surgically absent and the area was not evaluated.  IMPRESSION: Mild parenchymal atrophy in both kidneys.  No evidence of hydronephrosis.  Simple appearing cyst on the right kidney.  Original Report Authenticated By: Marlon Pel, M.D.   [place] Art/ven Flow Abd Pelv Doppler  06/30/2011  *RADIOLOGY REPORT*  Clinical Data: History of gastric varices and cryptogenic cirrhosis.  DUPLEX ULTRASOUND OF LIVER  Technique:  Color and duplex Doppler ultrasound was performed to evaluate the hepatic in-flow and out-flow vessels.  Comparison:  None.  Portal Vein Velocities: Main:      34 cm/sec Right:      19 cm/sec Left:        25 cm/sec  Hepatic Vein Velocities: Right:     49 cm/sec Middle:  56 cm/sec Left:       42 cm/sec  Hepatic Artery Velocity:   90 cm/sec Splenic Vein Velocity:      41 cm/sec  The spleen is enlarged with estimated dimensions of 13.2 x 17.1 x 5.9 cm.  Estimated splenic volume is 690 ml.  Varices:   None visualized by ultrasound. Ascites:   None visualized.  Findings: There is no evidence of splenic or portal vein thrombosis.  Portal vein velocities and flow direction are normal. There is no evidence of hepatic venoocclusive disease.  IMPRESSION: Normal patency of the splenic vein, portal vein and hepatic veins without abnormal portal velocities or abnormal direction of portal vein flow.  There is evidence of moderate splenomegaly.  Original Report Authenticated By: Reola Calkins, M.D.    echocardiogram  Hospital Course: [phone] admitted with complaints of generalized malaise and dizzinness associated with chills, and poor appetite ongoing for 2 days. In the ED his BP was in the 70s at presentation, but responded to fluid challenges into the low 100s systolic, occasionally in the 96E. His renal function had taken a turn for the worse, from a baseline creatinine of 1.2 in December 2012 to 2.65 at  presentation.  Since his admit, the pt has been noted to be having melanotic stools. He was also found to have a marked anemia. He admited that he had been having similar dark/black stools for "a few days or more" at home.   Principal  Problem:  *Hypotension due to blood loss  GI bleed- melena  Acute blood loss anemia  Gastric and duodenal angiodysplasia His hemoglobin was noted to be 6.4 and INR 3.03. His ASA and Pradaxa were held.  2 Units PRBC were given. EGD performed on 6/3- ENDOSCOPIC IMPRESSION:  1) Normal esophagus  2) Gastric varices in the fundus without stigmata of recent  bleeding  3) Multiple small, non-bleeding angioectasias in the cardia,  body and incisura.  4) Mild stenosis in the pyloric channel, but able to be  traversed with mild resistance  5) Normal duodenum  RECOMMENDATIONS:  1) Continue pRBC transfusion with close monitoring of Hgb/HCT  2) Can discontinue octreotide, but would continue PPI gtt until  tomorrow.  3) Monitor coags closely.  4) Consider repeat EGD once Hgb and INR/coagulation profile  stable for APC of angioectasias.  5) Given isolated gastric varices, would rule out splenic vein  Thrombosis. DR Pyrtle planned to rescope at a later day to treat the AVMs with APC.   Due to continued bleeding, he received another unit of blood- stool remained melanotic.  Repeat EGD done on 6/6  ENDOSCOPIC IMPRESSION:  1) Normal esophagus  2) Gastric varices in the fundus. Nonbleeding.  3) Angioectasias in the total stomach treated successfully with  APC.  4) Normal duodenum  5) Submucosal nodule in the bulb of duodenum.  RECOMMENDATIONS:  1) Continue daily PPI  2) Monitor Hgb/HCT and iron stores to ensure normalization.  3) GI clinic follow-up after discharge  4) The need for possible further evaluation of submucosal nodule  in duodenum can be discussed in follow-up.  Black stools eventually stopped on 5/8 and have not recurred  C. Diff  colitis Unfortunately the day he stopped passing black stool, stool became brown with mucous and more frequent. 10 or mor BMs / day. C. Diff was found to be positive. He received 2 days of IV flagyl which was transitioned to PO Flagyl on 6/10. Stools have decreased in frequency to 2-3 in a 24 hr period. He has been tolerating a regular diet since the beginning of this episode.   Hypovolemic hypotension  Present on admission and resolved with IVF.  Stopped hydration and resumed lasix as this had resolved but due to profused diarrhea, hydration once again was resumed. He is now being discahrged home on his usual dose of Lasix. Coreg has been signifiacntly decreased due to hypotension. He needs a f/u BP at his doctors office. He has an appt for 2 days from now.   Coagulopathy  Pradaxa is known to interfere with the INR assay, but wether or not this "falsely elevated" INR is indicative of a true PT related coagulopathy is a matter of debate - nonetheless, in this situation, we have treated the pt aggressively with FFP and Vitamin K due to his active bleeding -   Acute blood loss anemia and iron deffiency  IV Iron replacement was given due to severe deffiency - his PO  Iron has been increased to daily from QOD.   Hematuria - bladder cancer s/p ileal conduit  Mild/minimal - likely oozing due to coagulopathy resolved   cryptogenic cirrhosis  Followed by Dr Russella Dar - gastric varices confirmed on EGD but no evidence of bleeding varices -  ultrasound to evaluate for splenic vein thrombosis was negative.   Hypoglycemia in DM2  Due to meds + ARF Holding Amaryl and cutting back on Metformin on d/c as his sugars are very well controlled on a diabetic  diet. Pt admits he was not adhering to a diabetic diet at home  AKI (acute kidney injury)  Likely pre-renal due to hypotension due to acute anemia - resolved w/ volume resuscitation   A-fib  Was on pradaxa prior to admit - due to  GIB clearly can't continue  pradaxa for now   Diastolic CHF - grade 1  Well compensated at present - back to his baseline weight on d/c- 220 lbs  O2 dependent COPD  Well compensated during the hospital stay     Day of Discharge Physical Exam: BP 130/54  Pulse 79  Temp(Src) 98.4 F (36.9 C) (Oral)  Resp 23  Ht 5\' 8"  (1.727 m)  Wt 100.1 kg (220 lb 10.9 oz)  BMI 33.55 kg/m2  SpO2 99% Physical Exam:  General: No acute respiratory distress - no longer pale  Lungs: Clear to auscultation bilaterally without wheezes or crackles Cardiovascular: irreg irreg w/o gallup or rub - no appreciable M  Abdomen: soft, non-tender, BS +, non-distended - ileal conduit in RLQ- bag reveals clear urine Extremities: persistant 2+ LE edema w/o cyanosis or clubbing- has TEDS on   Results for orders placed during the hospital encounter of 06/26/11 (from the past 24 hour(s))  GLUCOSE, CAPILLARY     Status: Abnormal   Collection Time   07/05/11 12:19 PM      Component Value Range   Glucose-Capillary 137 (*) 70 - 99 (mg/dL)  GLUCOSE, CAPILLARY     Status: Abnormal   Collection Time   07/05/11  4:23 PM      Component Value Range   Glucose-Capillary 143 (*) 70 - 99 (mg/dL)  GLUCOSE, CAPILLARY     Status: Normal   Collection Time   07/05/11 10:03 PM      Component Value Range   Glucose-Capillary 96  70 - 99 (mg/dL)   Comment 1 Documented in Chart     Comment 2 Notify RN    CBC     Status: Abnormal   Collection Time   07/06/11  3:38 AM      Component Value Range   WBC 4.4  4.0 - 10.5 (K/uL)   RBC 2.90 (*) 4.22 - 5.81 (MIL/uL)   Hemoglobin 8.4 (*) 13.0 - 17.0 (g/dL)   HCT 16.1 (*) 09.6 - 52.0 (%)   MCV 88.6  78.0 - 100.0 (fL)   MCH 29.0  26.0 - 34.0 (pg)   MCHC 32.7  30.0 - 36.0 (g/dL)   RDW 04.5 (*) 40.9 - 15.5 (%)   Platelets 143 (*) 150 - 400 (K/uL)  BASIC METABOLIC PANEL     Status: Abnormal   Collection Time   07/06/11  3:38 AM      Component Value Range   Sodium 141  135 - 145 (mEq/L)   Potassium 4.2  3.5 - 5.1  (mEq/L)   Chloride 111  96 - 112 (mEq/L)   CO2 20  19 - 32 (mEq/L)   Glucose, Bld 93  70 - 99 (mg/dL)   BUN 10  6 - 23 (mg/dL)   Creatinine, Ser 8.11  0.50 - 1.35 (mg/dL)   Calcium 8.5  8.4 - 91.4 (mg/dL)   GFR calc non Af Amer 81 (*) >90 (mL/min)   GFR calc Af Amer >90  >90 (mL/min)  GLUCOSE, CAPILLARY     Status: Normal   Collection Time   07/06/11  8:08 AM      Component Value Range   Glucose-Capillary 92  70 - 99 (  mg/dL)   Comment 1 Documented in Chart     Comment 2 Notify RN      Disposition: stable - will need close f/u as oupt   Follow-up Appts: Discharge Orders    Future Appointments: Provider: Department: Dept Phone: Center:   07/08/2011 3:00 PM Eustaquio Boyden, MD [facility] [phone] [facility]   08/02/2011 8:55 AM [facility] [phone] [facility]   08/06/2011 2:45 PM Eustaquio Boyden, MD [facility] [phone] [facility]   08/09/2011 11:15 AM Meryl Dare, MD,FACG [facility] [phone] [facility]     Future Orders Please Complete By Expires   Diet - low sodium heart healthy and diabetic- carb controlled     Increase activity slowly      Discharge instructions      Comments:   Please see PCP in 2 days - have him check BP as we are resuming your BP meds and Lasix. He will need to repeat CBC and B met but he can wait 5-7 days before doing this.      Follow-up as above. Needs f/u of a Duodenal nodule- GI is aware of this.    Time on Discharge: >45 min  Signed: Laquasha Groome 07/06/2011, 10:39 AM

## 2011-07-06 NOTE — Progress Notes (Signed)
Discharge instructions given, pt verbalized understanding and signed forms. Family at bedside to hear as well. Copies of forms and scripts given. VSS, pt asked if he could take daily medicines at home. IV removed.

## 2011-07-07 LAB — TYPE AND SCREEN
Antibody Screen: NEGATIVE
DAT, IgG: POSITIVE
Donor AG Type: NEGATIVE
Unit division: 0
Unit division: 0
Unit division: 0
Unit division: 0

## 2011-07-08 ENCOUNTER — Ambulatory Visit (INDEPENDENT_AMBULATORY_CARE_PROVIDER_SITE_OTHER): Payer: PRIVATE HEALTH INSURANCE | Admitting: Family Medicine

## 2011-07-08 ENCOUNTER — Encounter: Payer: Self-pay | Admitting: Family Medicine

## 2011-07-08 VITALS — BP 134/80 | HR 88 | Temp 97.8°F | Wt 218.0 lb

## 2011-07-08 DIAGNOSIS — I10 Essential (primary) hypertension: Secondary | ICD-10-CM

## 2011-07-08 DIAGNOSIS — Z7901 Long term (current) use of anticoagulants: Secondary | ICD-10-CM

## 2011-07-08 DIAGNOSIS — K319 Disease of stomach and duodenum, unspecified: Secondary | ICD-10-CM

## 2011-07-08 DIAGNOSIS — N179 Acute kidney failure, unspecified: Secondary | ICD-10-CM

## 2011-07-08 DIAGNOSIS — K31819 Angiodysplasia of stomach and duodenum without bleeding: Secondary | ICD-10-CM

## 2011-07-08 DIAGNOSIS — D62 Acute posthemorrhagic anemia: Secondary | ICD-10-CM

## 2011-07-08 DIAGNOSIS — K922 Gastrointestinal hemorrhage, unspecified: Secondary | ICD-10-CM

## 2011-07-08 DIAGNOSIS — A0472 Enterocolitis due to Clostridium difficile, not specified as recurrent: Secondary | ICD-10-CM

## 2011-07-08 DIAGNOSIS — K3189 Other diseases of stomach and duodenum: Secondary | ICD-10-CM

## 2011-07-08 DIAGNOSIS — J449 Chronic obstructive pulmonary disease, unspecified: Secondary | ICD-10-CM

## 2011-07-08 MED ORDER — PANTOPRAZOLE SODIUM 40 MG PO TBEC: 40.0000 mg | DELAYED_RELEASE_TABLET | Freq: Every day | ORAL | Status: DC

## 2011-07-08 MED ORDER — METRONIDAZOLE 500 MG PO TABS: 500.0000 mg | ORAL_TABLET | Freq: Three times a day (TID) | ORAL | Status: AC

## 2011-07-08 NOTE — Patient Instructions (Addendum)
Stop lisinopril. Return on Monday for blood work (kidney function and blood count). Continue other meds as up to now. I've sent in another 5 days worth of flagyl for total of 10 day course. Start protonix daily.

## 2011-07-08 NOTE — Progress Notes (Signed)
  Subjective:    Patient ID: [name], male    DOB: 1933/05/05, 78 y.o.   MRN: [id number]  HPI CC: hospital f/u  [name] is a pleasant 78 yo with complicated history including bladder cancer s/p ileal conduit and ileostomy, cryptogenic cirrhosis, T2DM, HTN, HLD and O2 dependent COPD (2-3L by [place]) and grade 1 dCHF who presents today for hospital f/u. Also h/o chronic Afib, rate controlled on beta blocker and pradaxa (recently discontinued).  Records reviewed: Recent hospitalization from 6/1-11/2011 for melanotic GI bleed with hypotension, acute blood loss anemia, ARF, complicated by c diff colitis, with EGD by Dr. Rhea [name] showing gastric and duodenal angiodysplasia which were treated with APC.  Also had anti-Duffy antibodies.  Given FFP and given vit K due to active bleeding, as well as transfused 2 units pRBC prior to D/C.  Given iron infusion as well and iron pills changed from QOD to QD.  Aspirin, pradaxa, and glimepiride were d/c at discharge.  Recommendations at discharge: Continue daily ppi (I don't see where this was started at discharge) Monitor H/H and iron (d/c hemoglobin was 8.4) F/u planned with GI (to follow duodenal submucosal nodule) - scheduled 08/09/2011  Finishing PO flagyl for c diff colitis (10 d course, but only has 2 more days worth of pills).  D/c creatinine 0.9.  Review of Systems No recent f/c, n/v, CP/tightness, SOB, black tarry stool or blood in stool, HA.  Diarrhea slowly resolving.  Some dizziness with orthostatic changes.    Objective:   Physical Exam  Nursing note and vitals reviewed. Constitutional: He appears well-developed and well-nourished. No distress.       Wearing nasal cannula  HENT:  Head: Normocephalic and atraumatic.  Mouth/Throat: Oropharynx is clear and moist. No oropharyngeal exudate.  Eyes: Conjunctivae and EOM are normal. Pupils are equal, round, and reactive to light. No scleral icterus.  Neck: Normal range of motion. Neck supple. No  hepatojugular reflux present. Carotid bruit is not present.  Cardiovascular: Normal rate, normal heart sounds and intact distal pulses.  An irregular rhythm present.  No murmur heard. Pulmonary/Chest: Effort normal and breath sounds normal. No respiratory distress. He has no wheezes. He has no rales.       Mild bibasilar crackles  Abdominal: Soft. Bowel sounds are normal. He exhibits no distension. There is no tenderness. There is no rebound and no guarding.  Musculoskeletal: He exhibits edema (trace pedal edema).  Skin: Skin is warm and dry. No rash noted.  Psychiatric: He has a normal mood and affect.       Assessment & Plan:

## 2011-07-10 NOTE — Assessment & Plan Note (Signed)
S/p treatment in hospital.

## 2011-07-10 NOTE — Assessment & Plan Note (Signed)
D/c creatinine 0.9, recheck next week. Staying hydrated.

## 2011-07-10 NOTE — Assessment & Plan Note (Signed)
Was not d/c on ppi - will start protonix until seen by GI.

## 2011-07-10 NOTE — Assessment & Plan Note (Addendum)
pradaxa on hold given recent GIB.

## 2011-07-10 NOTE — Assessment & Plan Note (Signed)
Stable

## 2011-07-10 NOTE — Assessment & Plan Note (Signed)
Return Monday for labwork.  Overall stable since d/c, denies any melena.

## 2011-07-10 NOTE — Assessment & Plan Note (Signed)
Pending f/u with GI.

## 2011-07-10 NOTE — Assessment & Plan Note (Signed)
Stable on current antihypertensive regimen, continue.  No changes today. Coreg dose has been reduced.  Still unclear why on ACEI and ARB, advised to stop lisinopril. Continue lasix.

## 2011-07-10 NOTE — Assessment & Plan Note (Signed)
Will extend 5d for full 10 day course. Decreasing diarrhea.

## 2011-07-12 ENCOUNTER — Other Ambulatory Visit (INDEPENDENT_AMBULATORY_CARE_PROVIDER_SITE_OTHER): Payer: PRIVATE HEALTH INSURANCE

## 2011-07-12 DIAGNOSIS — E119 Type 2 diabetes mellitus without complications: Secondary | ICD-10-CM

## 2011-07-12 DIAGNOSIS — E785 Hyperlipidemia, unspecified: Secondary | ICD-10-CM

## 2011-07-12 DIAGNOSIS — D62 Acute posthemorrhagic anemia: Secondary | ICD-10-CM

## 2011-07-12 LAB — CBC WITH DIFFERENTIAL/PLATELET
Basophils Absolute: 0 10*3/uL (ref 0.0–0.1)
Basophils Relative: 0.4 % (ref 0.0–3.0)
Eosinophils Absolute: 0.2 10*3/uL (ref 0.0–0.7)
Eosinophils Relative: 3.4 % (ref 0.0–5.0)
HCT: 29.1 % — ABNORMAL LOW (ref 39.0–52.0)
Hemoglobin: 9.4 g/dL — ABNORMAL LOW (ref 13.0–17.0)
Lymphocytes Relative: 24.6 % (ref 12.0–46.0)
Lymphs Abs: 1.6 10*3/uL (ref 0.7–4.0)
MCHC: 32.5 g/dL (ref 30.0–36.0)
MCV: 92.4 fl (ref 78.0–100.0)
Monocytes Absolute: 0.7 10*3/uL (ref 0.1–1.0)
Monocytes Relative: 10.6 % (ref 3.0–12.0)
Neutro Abs: 3.9 10*3/uL (ref 1.4–7.7)
Neutrophils Relative %: 61 % (ref 43.0–77.0)
Platelets: 177 10*3/uL (ref 150.0–400.0)
RBC: 3.15 Mil/uL — ABNORMAL LOW (ref 4.22–5.81)
RDW: 21.2 % — ABNORMAL HIGH (ref 11.5–14.6)
WBC: 6.3 10*3/uL (ref 4.5–10.5)

## 2011-07-12 LAB — LIPID PANEL
Cholesterol: 59 mg/dL (ref 0–200)
HDL: 22.9 mg/dL — ABNORMAL LOW
LDL Cholesterol: 14 mg/dL (ref 0–99)
Total CHOL/HDL Ratio: 3
Triglycerides: 109 mg/dL (ref 0.0–149.0)
VLDL: 21.8 mg/dL (ref 0.0–40.0)

## 2011-07-12 LAB — COMPREHENSIVE METABOLIC PANEL
BUN: 8 mg/dL (ref 6–23)
CO2: 27 mEq/L (ref 19–32)
Creatinine, Ser: 1.1 mg/dL (ref 0.4–1.5)
GFR: 66.37 mL/min (ref >60.00)
Glucose, Bld: 105 mg/dL — ABNORMAL HIGH (ref 70–99)
Sodium: 141 mEq/L (ref 135–145)
Total Bilirubin: 1.6 mg/dL — ABNORMAL HIGH (ref 0.3–1.2)
Total Protein: 6.2 g/dL (ref 6.0–8.3)

## 2011-07-12 LAB — MICROALBUMIN / CREATININE URINE RATIO
Creatinine,U: 145.6 mg/dL
Microalb, Ur: 2.1 mg/dL — ABNORMAL HIGH (ref 0.0–1.9)

## 2011-07-22 ENCOUNTER — Other Ambulatory Visit: Payer: Self-pay

## 2011-07-22 NOTE — Telephone Encounter (Signed)
[name] request refill Klor con, Coreg and Magnesium to [facility]. Dr Butler Denmark filled Coreg # 30 on 07/06/11.Please advise. Pt seen 07/08/11.

## 2011-07-23 ENCOUNTER — Encounter: Payer: Self-pay | Admitting: Cardiovascular Disease

## 2011-07-23 ENCOUNTER — Ambulatory Visit (INDEPENDENT_AMBULATORY_CARE_PROVIDER_SITE_OTHER): Payer: PRIVATE HEALTH INSURANCE | Admitting: Cardiovascular Disease

## 2011-07-23 VITALS — BP 140/72 | HR 97 | Ht 68.0 in | Wt 205.2 lb

## 2011-07-23 DIAGNOSIS — D509 Iron deficiency anemia, unspecified: Secondary | ICD-10-CM

## 2011-07-23 DIAGNOSIS — I5030 Unspecified diastolic (congestive) heart failure: Secondary | ICD-10-CM

## 2011-07-23 DIAGNOSIS — I509 Heart failure, unspecified: Secondary | ICD-10-CM

## 2011-07-23 DIAGNOSIS — I4891 Unspecified atrial fibrillation: Secondary | ICD-10-CM

## 2011-07-23 DIAGNOSIS — E119 Type 2 diabetes mellitus without complications: Secondary | ICD-10-CM

## 2011-07-23 DIAGNOSIS — E785 Hyperlipidemia, unspecified: Secondary | ICD-10-CM

## 2011-07-23 DIAGNOSIS — K922 Gastrointestinal hemorrhage, unspecified: Secondary | ICD-10-CM

## 2011-07-23 DIAGNOSIS — I499 Cardiac arrhythmia, unspecified: Secondary | ICD-10-CM

## 2011-07-23 MED ORDER — CARVEDILOL 6.25 MG PO TABS: 6.2500 mg | ORAL_TABLET | Freq: Two times a day (BID) | ORAL | Status: DC

## 2011-07-23 MED ORDER — CARVEDILOL 12.5 MG PO TABS: 12.5000 mg | ORAL_TABLET | Freq: Two times a day (BID) | ORAL | Status: DC

## 2011-07-23 MED ORDER — MAGNESIUM 400 MG PO CAPS: 400.0000 mg | ORAL_CAPSULE | Freq: Every day | ORAL | Status: DC

## 2011-07-23 MED ORDER — POTASSIUM CHLORIDE CRYS ER 20 MEQ PO TBCR: 20.0000 meq | EXTENDED_RELEASE_TABLET | Freq: Every day | ORAL | Status: DC

## 2011-07-23 NOTE — Assessment & Plan Note (Signed)
He appears to be relatively euvolemic on today's visit. We'll increase his Coreg for rate control

## 2011-07-23 NOTE — Telephone Encounter (Signed)
Called in Rx as directed.  

## 2011-07-23 NOTE — Progress Notes (Signed)
Patient ID: [name], male    DOB: 12/22/1933, 78 y.o.   MRN: [id number]  HPI Comments: [name] is a very pleasant 75 your gentleman with a long history of smoking, suspected COPD, no known coronary artery disease,  by previous echo in 2010 , who was  brought in by EMS to the hospital in mid May 2012 with respiratory distress, started on BiPAP with chest x-ray showing right lower lobe pneumonia, new atrial fibrillation. He was offered cardioversion on his last office visit though declined and preferred medical management. He had admission to [facility] in every 2013 4 right upper lobe pneumonia.  Admission 09/12/2011 4 COPD exacerbation.  He has continued to decline cardioversion on his previous office visits. He preferred to stay on anticoagulation. He had a recent hospital admission at the beginning of June 27 2011 [facility] hospital with GI bleed. He was admitted from June 1 to June 11. Upper endoscopy  showed 11 angiodysplasias with ablation, gastric varices. He was started on proton pump inhibitor. He was also treated for C. difficile . Hematocrit reached the low 20s on admission, most recent was 29 .   his anticoagulation was held at discharge . His dose of Coreg was also significantly decreased at discharge from 25 mg to 6.25 mg twice a day.  Since discharge, he reports that he is slowly feeling better. His energy is slowly improving though not at baseline . He denies any significant shortness of breath or chest pain with exertion .  Echocardiogram May 2013 shows normal LV systolic function, greater than 55%, mildly dilated left atrium, no diastolic dysfunction  EKG today shows atrial fibrillation with ventricular rate 97 beats per minute with no significant ST or T wave changes   Outpatient Encounter Prescriptions as of 07/23/2011  Medication Sig Dispense Refill  . acetaminophen (TYLENOL) 325 MG tablet Take 325 mg by mouth every 6 (six) hours as needed.      Marland Kitchen albuterol-ipratropium (COMBIVENT) 18-103  MCG/ACT inhaler Inhale 2 puffs into the lungs every 6 (six) hours as needed. For wheezing      . Cyanocobalamin (B-12) 2500 MCG TABS Take 1 tablet by mouth daily.      . ferrous sulfate (FERRO-BOB) 325 (65 FE) MG tablet Take 1 tablet (325 mg total) by mouth daily with breakfast.      . fluticasone (FLOVENT HFA) 110 MCG/ACT inhaler Inhale 2 puffs into the lungs 2 (two) times daily.      . furosemide (LASIX) 20 MG tablet Take 20 mg by mouth daily.      Marland Kitchen losartan (COZAAR) 100 MG tablet Take 100 mg by mouth daily.       . Magnesium 400 MG CAPS Take 400 mg by mouth daily.  90 capsule  3  . metFORMIN (GLUCOPHAGE) 500 MG tablet Take 500 mg by mouth daily with breakfast.      . ONE TOUCH LANCETS MISC One daily and as needed/ ICD code 250.00       . ONE TOUCH ULTRA TEST test strip TEST BLOOD SUGAR ONCE DAILY AS DIRECTED  100 each  11  . pantoprazole (PROTONIX) 40 MG tablet Take 1 tablet (40 mg total) by mouth daily.  30 tablet  1  . potassium chloride SA (K-DUR,KLOR-CON) 20 MEQ tablet Take 1 tablet (20 mEq total) by mouth daily.  90 tablet  3  . Respiratory Therapy Supplies (NEBULIZER) DEVI by Does not apply route. Use as directed       .  simvastatin (ZOCOR) 20 MG tablet Take 20 mg by mouth at bedtime.      Marland Kitchen tiotropium (SPIRIVA) 18 MCG inhalation capsule Place 18 mcg into inhaler and inhale daily.      . carvedilol (COREG) 6.25 MG tablet Take 1 tablet (6.25 mg total) by mouth 2 (two) times daily with a meal.  180 tablet  3  . DISCONTD: furosemide (LASIX) 40 MG tablet Take 20 mg by mouth 2 (two) times daily.          Review of Systems  Constitutional: Negative.   HENT: Negative.   Eyes: Negative.   Respiratory: Negative.   Cardiovascular: Negative.   Gastrointestinal: Negative.   Musculoskeletal: Positive for gait problem.  Skin: Negative.   Neurological: Negative.   Hematological: Negative.   Psychiatric/Behavioral: Negative.   All other systems reviewed and are negative.    BP 140/72   Pulse 97  Ht 5\' 8"  (1.727 m)  Wt 205 lb 4 oz (93.101 kg)  BMI 31.21 kg/m2   Physical Exam  Nursing note and vitals reviewed. Constitutional: He is oriented to person, place, and time. He appears well-developed and well-nourished.  HENT:  Head: Normocephalic.  Nose: Nose normal.  Mouth/Throat: Oropharynx is clear and moist.  Eyes: Conjunctivae are normal. Pupils are equal, round, and reactive to light.  Neck: Normal range of motion. Neck supple. No JVD present.  Cardiovascular: Normal rate, S1 normal, S2 normal, normal heart sounds and intact distal pulses.  An irregularly irregular rhythm present. Exam reveals no gallop and no friction rub.   No murmur heard. Pulmonary/Chest: Effort normal and breath sounds normal. No respiratory distress. He has no wheezes. He has no rales. He exhibits no tenderness.  Abdominal: Soft. Bowel sounds are normal. He exhibits no distension. There is no tenderness.  Musculoskeletal: Normal range of motion. He exhibits no edema and no tenderness.  Lymphadenopathy:    He has no cervical adenopathy.  Neurological: He is alert and oriented to person, place, and time. Coordination normal.  Skin: Skin is warm and dry. No rash noted. No erythema.  Psychiatric: He has a normal mood and affect. His behavior is normal. Judgment and thought content normal.           Assessment and Plan

## 2011-07-23 NOTE — Assessment & Plan Note (Signed)
We have encouraged continued exercise, careful diet management in an effort to lose weight. 

## 2011-07-23 NOTE — Assessment & Plan Note (Signed)
We'll need to discuss this with GI whether he can be restarted on anticoagulation in the near future given risk of stroke from atrial fibrillation.

## 2011-07-23 NOTE — Patient Instructions (Addendum)
You are doing well. Please increase the coreg to 12.5 mg twice a day Monitor your heart rate at home Goal heart rate less than 80  I will talk with GI about the blood thinner pills Talk with GI in mid July about restart blood thinner  Please call us if you have new issues that need to be addressed before your next appt.  Your physician wants you to follow-up in: 6 months.  You will receive a reminder letter in the mail two months in advance. If you don't receive a letter, please call our office to schedule the follow-up appointment.

## 2011-07-23 NOTE — Telephone Encounter (Signed)
Sent in

## 2011-07-23 NOTE — Assessment & Plan Note (Signed)
Recent hematocrit of 29. He is on daily iron. Slowly improving.

## 2011-07-23 NOTE — Assessment & Plan Note (Signed)
Rate is mildly elevated. We have suggested he increase his Coreg to 12.5 mg twice a day. I suspect he will likely need to be restarted on his previous dose of 25 mg twice a day. We'll closely monitor his heart rate.  We discussed anticoagulation with him and he is certainly high risk of developing left atrial thrombus and stroke. We have asked him to discuss this with GI at his followup in 2 weeks' time. He could restart pradaxa 150 mg twice a day and hold aspirin

## 2011-07-23 NOTE — Assessment & Plan Note (Signed)
We have suggested he stay on his simvastatin. Cholesterol is extremely low given his underlying coronary artery disease, likely necessary.

## 2011-07-28 ENCOUNTER — Other Ambulatory Visit: Payer: Self-pay | Admitting: Family Medicine

## 2011-07-28 ENCOUNTER — Encounter: Payer: Self-pay | Admitting: Family Medicine

## 2011-07-28 DIAGNOSIS — D509 Iron deficiency anemia, unspecified: Secondary | ICD-10-CM

## 2011-07-28 DIAGNOSIS — E119 Type 2 diabetes mellitus without complications: Secondary | ICD-10-CM

## 2011-08-02 ENCOUNTER — Other Ambulatory Visit (INDEPENDENT_AMBULATORY_CARE_PROVIDER_SITE_OTHER): Payer: PRIVATE HEALTH INSURANCE

## 2011-08-02 DIAGNOSIS — D509 Iron deficiency anemia, unspecified: Secondary | ICD-10-CM

## 2011-08-02 DIAGNOSIS — E119 Type 2 diabetes mellitus without complications: Secondary | ICD-10-CM

## 2011-08-02 LAB — CBC WITH DIFFERENTIAL/PLATELET
Basophils Absolute: 0 10*3/uL (ref 0.0–0.1)
Eosinophils Absolute: 0.2 10*3/uL (ref 0.0–0.7)
Lymphocytes Relative: 17.4 % (ref 12.0–46.0)
MCHC: 33 g/dL (ref 30.0–36.0)
Neutrophils Relative %: 70.2 % (ref 43.0–77.0)
RDW: 18.8 % — ABNORMAL HIGH (ref 11.5–14.6)

## 2011-08-02 LAB — HEMOGLOBIN A1C: Hgb A1c MFr Bld: 4.3 % — ABNORMAL LOW (ref 4.6–6.5)

## 2011-08-06 ENCOUNTER — Encounter: Payer: Self-pay | Admitting: Family Medicine

## 2011-08-06 ENCOUNTER — Ambulatory Visit (INDEPENDENT_AMBULATORY_CARE_PROVIDER_SITE_OTHER): Payer: PRIVATE HEALTH INSURANCE | Admitting: Family Medicine

## 2011-08-06 VITALS — BP 126/66 | HR 72 | Temp 97.9°F | Wt 209.5 lb

## 2011-08-06 DIAGNOSIS — D509 Iron deficiency anemia, unspecified: Secondary | ICD-10-CM

## 2011-08-06 DIAGNOSIS — J449 Chronic obstructive pulmonary disease, unspecified: Secondary | ICD-10-CM

## 2011-08-06 DIAGNOSIS — D485 Neoplasm of uncertain behavior of skin: Secondary | ICD-10-CM

## 2011-08-06 DIAGNOSIS — I509 Heart failure, unspecified: Secondary | ICD-10-CM

## 2011-08-06 DIAGNOSIS — I5030 Unspecified diastolic (congestive) heart failure: Secondary | ICD-10-CM

## 2011-08-06 DIAGNOSIS — E119 Type 2 diabetes mellitus without complications: Secondary | ICD-10-CM

## 2011-08-06 DIAGNOSIS — I10 Essential (primary) hypertension: Secondary | ICD-10-CM

## 2011-08-06 MED ORDER — FUROSEMIDE 20 MG PO TABS: 20.0000 mg | ORAL_TABLET | Freq: Two times a day (BID) | ORAL | Status: DC

## 2011-08-06 NOTE — Assessment & Plan Note (Signed)
Some crackles on lung exam today as well as noted weight gain of 5 lbs in same number of days.  Agree with increased lasix to 20mg  bid for next week.

## 2011-08-06 NOTE — Patient Instructions (Signed)
Return to see me in 3-4 months for follow up Good to see you today, call us with questions. Increase lasix (furosemide) to one pill twice daily.

## 2011-08-06 NOTE — Assessment & Plan Note (Signed)
Chronic. Anticipate low A1c due to recent acute blood loss anemia from GIB than to overtreated diabetes.  Continue metformin QAM.  Brings log of sugars which suggest great control.

## 2011-08-06 NOTE — Assessment & Plan Note (Signed)
Good control on current regimen.  Continue.

## 2011-08-06 NOTE — Assessment & Plan Note (Signed)
Not taking spiriva or flovent as too expensive.  Feels breathing is very stable, only on O2 at night now.  Denies SOB or cough.

## 2011-08-06 NOTE — Progress Notes (Signed)
  Subjective:    Patient ID: [name], male    DOB: 03-23-1933, 78 y.o.   MRN: [id number]  HPI CC: 1 mo f/u  [name] is a pleasant 78 yo with complicated history including bladder cancer s/p ileal conduit and ileostomy, cryptogenic cirrhosis, T2DM, HTN, HLD and COPD (2-3L O2 by [place] at night), chronic afib, and grade 1 dCHF who presents today for hospital f/u. Recently admitted for GIB with discontinuation of pradaxa.  Has f/u with GI next week.  Wt Readings from Last 3 Encounters:  08/06/11 209 lb 8 oz (95.029 kg)  07/23/11 205 lb 4 oz (93.101 kg)  07/08/11 218 lb (98.884 kg)   Brings log of weight, blood pressure and sugars: Weight 202 -> 207 in last week.  Told to double up on lasix Blood pressure 130-140/60-70s Sugars fasting 110-120s.  No low sugars. O2 sat 90-95% in am.  Only wearing O2 by [place] for sleep.  No chest pain, tightness, SOB, leg swelling.  No HA or dizziness.  No blood in stool.  No constipation.  Taking iron daily.  No falls in last year. Denies anhedonia, depression, sadness.  Past Medical History  Diagnosis Date  . Diabetes mellitus, type 2   . Hyperlipidemia   . Hypertension   . Cryptogenic cirrhosis     Followed by Dr Russella Dar, never biopsied, has been stable.  History of gastric varices.  . Pyloric stenosis     mild EGD 10/26/06  . Inguinal hernia     left  . Iron deficiency anemia   . Hemorrhoids   . B12 deficiency   . COPD (chronic obstructive pulmonary disease)     (Dr. Meredeth Ide)  . History of ileostomy   . History of pneumonia 2012  . Shortness of breath   . Chronic a-fib     on pradaxa, declined cardioversion in past  . Bladder cancer 1984    s/p ileal conduit  . History of bladder cancer   . GI bleed   . CHF (congestive heart failure)     diastolic. Echo 5/10 with mild LVH, EF 75%, grade 1 diastolic dysfunction, severe left atrial enlargement, aortic sclerosis  . Gastric varices      Review of Systems Per HPI    Objective:   Physical  Exam  Nursing note and vitals reviewed. Constitutional: He appears well-developed and well-nourished. No distress.  HENT:  Mouth/Throat: Oropharynx is clear and moist. No oropharyngeal exudate.  Eyes: Conjunctivae and EOM are normal. Pupils are equal, round, and reactive to light. No scleral icterus.  Neck: Normal range of motion. Neck supple. No hepatojugular reflux and no JVD present.  Cardiovascular: Normal rate, regular rhythm, normal heart sounds and intact distal pulses.   No murmur heard. Pulmonary/Chest: Effort normal. No respiratory distress. He has no wheezes. He has no rales.       Crackles bibasilarly R>L   Musculoskeletal: He exhibits edema (1+ pedal).  Skin: Skin is warm and dry. No rash noted.      Assessment & Plan:

## 2011-08-06 NOTE — Assessment & Plan Note (Signed)
Improving. Continue iron for now.  Recheck in 3-4 mo.

## 2011-08-06 NOTE — Assessment & Plan Note (Signed)
Pending derm appt - has been unable to schedule 2/2 recent acute illnesses.  Plans to schedule in next few months. Anticipate BCC, vs SCC.

## 2011-08-09 ENCOUNTER — Encounter: Payer: Self-pay | Admitting: Gastroenterology

## 2011-08-09 ENCOUNTER — Ambulatory Visit (INDEPENDENT_AMBULATORY_CARE_PROVIDER_SITE_OTHER): Payer: Medicare Other | Admitting: Gastroenterology

## 2011-08-09 VITALS — BP 140/60 | HR 77 | Ht 67.0 in | Wt 207.0 lb

## 2011-08-09 DIAGNOSIS — Q2733 Arteriovenous malformation of digestive system vessel: Secondary | ICD-10-CM

## 2011-08-09 DIAGNOSIS — K7460 Unspecified cirrhosis of liver: Secondary | ICD-10-CM

## 2011-08-09 DIAGNOSIS — K31819 Angiodysplasia of stomach and duodenum without bleeding: Secondary | ICD-10-CM

## 2011-08-09 NOTE — Patient Instructions (Addendum)
We will contact Dr. Mariah Milling to let him know that you can restart your Pradaxa. Please contact our office if you show any sign of bleeding. cc: Rolm Bookbinder, MD       Julien Nordmann, MD

## 2011-08-09 NOTE — Progress Notes (Signed)
History of Present Illness: This is a 78 year old male  with hx of cryptogenic cirrhosis with hx of gastric varices, recent hospitalization for CHF who was recently hospitalized with anemia,  Melena and hematuria. He received transfusions, Vit K and FFP. EGD on 07/01/11 showed gastric varices and several gastric AVMs treated with APC. A small duodenal nodule was noted at endoscopy. Pradaxa was temporarily discontinued and his cardiologist would like to restart Pradaxa. Since discharge he has felt well he notes normal brown colored stools. His hemoglobin has increased to 11.2.   Current Medications, Allergies, Past Medical History, Past Surgical History, Family History and Social History were reviewed in [employer] record.  Physical Exam: General: Well developed , well nourished, no acute distress Head: Normocephalic and atraumatic Eyes:  sclerae anicteric, EOMI Ears: Normal auditory acuity Mouth: No deformity or lesions Lungs: Clear throughout to auscultation Heart: Regular rate and rhythm; no murmurs, rubs or bruits Abdomen: Soft, non tender and non distended. No masses, hepatosplenomegaly or hernias noted. Normal Bowel sounds Musculoskeletal: Symmetrical with no gross deformities  Pulses:  Normal pulses noted Extremities: No clubbing, cyanosis, edema or deformities noted Neurological: Alert oriented x 4, grossly nonfocal Psychological:  Alert and cooperative. Normal mood and affect  Assessment and Recommendations:  1. Bleeding gastric AVM status post APC therapy no recurrent bleeding. He has a high risk to bleed from gastrointestinal AVMs on anticoagulants however I think it is reasonable to restart anticoagulants and observe for signs and symptoms of rebleeding. I would recommend that he avoid all aspirin and NSAID products while on anticoagulants. He is advised to contact us or one of his physicians for any signs or symptoms of gastrointestinal bleeding. I will send a  message to Dr.Gollan.  2. Small duodenal nodule undoubtedly benign. Consider further evaluation with endoscopic ultrasound later this year. This would require interrupting his anticoagulation temporarily for possible biopsy.  3. Cryptogenic cirrhosis with gastric varices. Although these have not bled they are at higher risk of bleeding on anticoagulation. He is advised to contact us or one of his physicians for any signs or symptoms of gastrointestinal bleeding.  4. Anemia secondary to acute blood loss. Hemoglobin has increased to 11.2.

## 2011-08-31 ENCOUNTER — Other Ambulatory Visit: Payer: Self-pay | Admitting: Family Medicine

## 2011-09-03 ENCOUNTER — Other Ambulatory Visit: Payer: Self-pay | Admitting: Family Medicine

## 2011-09-13 ENCOUNTER — Other Ambulatory Visit: Payer: Self-pay | Admitting: Cardiovascular Disease

## 2011-09-13 NOTE — Telephone Encounter (Signed)
Refilled Pradaxa. 

## 2011-09-22 ENCOUNTER — Ambulatory Visit: Payer: Self-pay | Admitting: Specialist

## 2011-09-22 LAB — CREATININE, SERUM
Creatinine: 1.1 mg/dL (ref 0.60–1.30)
EGFR (African American): >60
EGFR (Non-African Amer.): >60

## 2011-09-24 ENCOUNTER — Other Ambulatory Visit: Payer: Self-pay | Admitting: Family Medicine

## 2011-09-24 ENCOUNTER — Telehealth: Payer: Self-pay | Admitting: Family Medicine

## 2011-09-24 DIAGNOSIS — E119 Type 2 diabetes mellitus without complications: Secondary | ICD-10-CM

## 2011-09-24 NOTE — Telephone Encounter (Signed)
Let's restart metformin Monday - come in Tuesday for Cr check.  Drink plenty of water in interim.

## 2011-09-24 NOTE — Telephone Encounter (Signed)
Caller: Jennifer/Child; Patient Name: [name]; PCP: [facility]); Best Callback Phone Number: [phone] CT Scan of Lungs with Contrast on 09/22/11 NEEDS TO KNOW WHEN CAN RESTART METFORMIN. Checking blood sugar daily and they are wnl. PLEASE CHECK WITH DR. Sharen Hones AND LET THEM KNOW.  Best Callback Phone Number: [phone] PCP CALLS - NO TRIAGE.

## 2011-09-24 NOTE — Telephone Encounter (Signed)
Patient notified and lab appt scheduled.  

## 2011-09-28 ENCOUNTER — Other Ambulatory Visit (INDEPENDENT_AMBULATORY_CARE_PROVIDER_SITE_OTHER): Payer: Medicare Other

## 2011-09-28 DIAGNOSIS — E119 Type 2 diabetes mellitus without complications: Secondary | ICD-10-CM

## 2011-09-28 LAB — BASIC METABOLIC PANEL
GFR: 69.12 mL/min (ref >60.00)
Potassium: 3.8 mEq/L (ref 3.5–5.1)
Sodium: 142 mEq/L (ref 135–145)

## 2011-12-07 ENCOUNTER — Telehealth: Payer: Self-pay

## 2011-12-07 ENCOUNTER — Ambulatory Visit (INDEPENDENT_AMBULATORY_CARE_PROVIDER_SITE_OTHER): Payer: Medicare Other | Admitting: Family Medicine

## 2011-12-07 ENCOUNTER — Ambulatory Visit (INDEPENDENT_AMBULATORY_CARE_PROVIDER_SITE_OTHER)
Admission: RE | Admit: 2011-12-07 | Discharge: 2011-12-07 | Disposition: A | Discharge status: Home / Self Care | Payer: Medicare Other | Source: Ambulatory Visit | Attending: Family Medicine | Admitting: Family Medicine

## 2011-12-07 ENCOUNTER — Encounter: Payer: Self-pay | Admitting: Family Medicine

## 2011-12-07 VITALS — BP 138/68 | HR 60 | Temp 98.0°F | Wt 216.5 lb

## 2011-12-07 DIAGNOSIS — J449 Chronic obstructive pulmonary disease, unspecified: Secondary | ICD-10-CM

## 2011-12-07 DIAGNOSIS — L989 Disorder of the skin and subcutaneous tissue, unspecified: Secondary | ICD-10-CM

## 2011-12-07 DIAGNOSIS — I10 Essential (primary) hypertension: Secondary | ICD-10-CM

## 2011-12-07 DIAGNOSIS — E119 Type 2 diabetes mellitus without complications: Secondary | ICD-10-CM

## 2011-12-07 DIAGNOSIS — Z23 Encounter for immunization: Secondary | ICD-10-CM

## 2011-12-07 DIAGNOSIS — K922 Gastrointestinal hemorrhage, unspecified: Secondary | ICD-10-CM

## 2011-12-07 DIAGNOSIS — IMO0002 Reserved for concepts with insufficient information to code with codable children: Secondary | ICD-10-CM

## 2011-12-07 DIAGNOSIS — E785 Hyperlipidemia, unspecified: Secondary | ICD-10-CM

## 2011-12-07 DIAGNOSIS — M5410 Radiculopathy, site unspecified: Secondary | ICD-10-CM

## 2011-12-07 DIAGNOSIS — I5030 Unspecified diastolic (congestive) heart failure: Secondary | ICD-10-CM

## 2011-12-07 DIAGNOSIS — D485 Neoplasm of uncertain behavior of skin: Secondary | ICD-10-CM

## 2011-12-07 DIAGNOSIS — D509 Iron deficiency anemia, unspecified: Secondary | ICD-10-CM

## 2011-12-07 DIAGNOSIS — I509 Heart failure, unspecified: Secondary | ICD-10-CM

## 2011-12-07 LAB — COMPREHENSIVE METABOLIC PANEL
AST: 29 U/L (ref 0–37)
Albumin: 4.3 g/dL (ref 3.5–5.2)
Alkaline Phosphatase: 89 U/L (ref 39–117)
BUN: 17 mg/dL (ref 6–23)
Creatinine, Ser: 1.1 mg/dL (ref 0.4–1.5)
Potassium: 4.3 mEq/L (ref 3.5–5.1)
Total Bilirubin: 0.9 mg/dL (ref 0.3–1.2)

## 2011-12-07 LAB — CBC WITH DIFFERENTIAL/PLATELET
Basophils Relative: 0.4 % (ref 0.0–3.0)
Eosinophils Absolute: 0.2 10*3/uL (ref 0.0–0.7)
HCT: 39.8 % (ref 39.0–52.0)
Lymphs Abs: 1.5 10*3/uL (ref 0.7–4.0)
MCHC: 33.4 g/dL (ref 30.0–36.0)
MCV: 90.4 fl (ref 78.0–100.0)
Monocytes Absolute: 0.7 10*3/uL (ref 0.1–1.0)
Neutrophils Relative %: 64.9 % (ref 43.0–77.0)
RBC: 4.41 Mil/uL (ref 4.22–5.81)

## 2011-12-07 MED ORDER — AMLODIPINE BESYLATE 5 MG PO TABS: 5.0000 mg | ORAL_TABLET | Freq: Every day | ORAL | Status: DC

## 2011-12-07 NOTE — Assessment & Plan Note (Signed)
Recommended restart night time O2.

## 2011-12-07 NOTE — Telephone Encounter (Signed)
Pt took BP when got to pharmacy BP was 173/64 took again at pharmacy BP 174/71. When pt got home just now BP was 180/89.Please advise.

## 2011-12-07 NOTE — Assessment & Plan Note (Signed)
Stable on current regimen, check Cr and BUN today to assess tolerance of lasix.

## 2011-12-07 NOTE — Assessment & Plan Note (Signed)
No evidence of further bleed.  Check CBC today.

## 2011-12-07 NOTE — Assessment & Plan Note (Deleted)
Concern for BCC.  Referral to dermatologist today.

## 2011-12-07 NOTE — Patient Instructions (Addendum)
Pass by Marion's office for referral to skin doctor.  That looks like a basal cell skin cancer that needs to be removed. Recheck blood work today. Flu shot today. Check bp at store and then when you get home to compare and let me know results. For buttock pain - xrays today.  I anticipate this is coming from sciatica - stretching exercises provided today.  Let us know if progressively worsening for further evaluation, consideration of MRI.

## 2011-12-07 NOTE — Assessment & Plan Note (Signed)
Chronic, continue statin. 

## 2011-12-07 NOTE — Assessment & Plan Note (Addendum)
Has been recommended eval by derm since 07/2010.  Has not done.  Referral placed again today. Growing lesion. Concern for BCC vs SCC.  Discussed with pt likely cancer and need to have removed.

## 2011-12-07 NOTE — Assessment & Plan Note (Signed)
Chronic, seems adequately controlled. Unclear why A1c falsely low given CBGs he endorses from log. Recheck today.  May need to start checking fructosamine instead. Only on metformin.

## 2011-12-07 NOTE — Progress Notes (Signed)
Subjective:    Patient ID: [name], male    DOB: 18-Feb-1933, 78 y.o.   MRN: [id number]  HPI CC: DM f/u  [name] is a pleasant 78 yo with complicated history including bladder cancer s/p ileal conduit and ileostomy, cryptogenic cirrhosis, T2DM, HTN, HLD and COPD (2-3L O2 by [place] at night --> hasn't been using recently), chronic afib, and grade 1 dCHF, h/o GIB now back on pradaxa, who presents today for DM f/u.  Also having pain bilateral buttock, mostly worse on left side.  + radiation down lateral legs and occasionally feet.  + some early tingling/numbness.  Feels leg weakness.  No falls.  H/o HNP on CT scan 2012.  No LBP.  No bowel/bladder accidents.  Intermittent leg pains.  H/o bladder cancer.  Ongoing for last several months, progressively worsening.    DM - checks cbg's fasting every morning.  Occasional tingling.  Eye exam 12/2010.  Due for this.  Due for foot exam.  Compliant with metformin 500mg  daily. Lab Results  Component Value Date   HGBA1C 4.3* 08/02/2011  -->anticipate falsely low. Lab Results  Component Value Date   CREATININE 1.1 09/28/2011    HLD - compliant with simvastatin.  No myalgias.  HTN - morning bp running high.  Occasional morning headache.  No vision changes, CP/tightness, SOB, leg swelling.   Lesion found on liver - liver MRI scheduled for 12/20/2011.  Brings log: bp - 160-170s/70-80s. HR 50s O2 sat running 88-93 on RA in AM, then mid 90s during day. Sugars 100-140 Goal dry weight 220lbs.  Wt stable at 212lbs.  Past Medical History  Diagnosis Date  . Diabetes mellitus, type 2   . Hyperlipidemia   . Hypertension   . Cryptogenic cirrhosis     Followed by Dr Russella Dar, never biopsied, has been stable.  History of gastric varices.  . Pyloric stenosis     mild EGD 10/26/06  . Inguinal hernia     left  . Iron deficiency anemia   . Hemorrhoids   . B12 deficiency   . COPD (chronic obstructive pulmonary disease)     (Dr. Meredeth Ide)  . History of ileostomy    . History of pneumonia 2012  . Shortness of breath   . Chronic a-fib     on pradaxa, declined cardioversion in past  . Bladder cancer 1984    s/p ileal conduit  . History of bladder cancer   . GI bleed   . CHF (congestive heart failure)     diastolic. Echo 5/10 with mild LVH, EF 75%, grade 1 diastolic dysfunction, severe left atrial enlargement, aortic sclerosis  . Gastric varices      Review of Systems Per HPI    Objective:   Physical Exam  Nursing note and vitals reviewed. Constitutional: He appears well-developed and well-nourished. No distress.  HENT:  Head: Normocephalic and atraumatic.  Right Ear: External ear normal.  Left Ear: External ear normal.  Nose: Nose normal.  Mouth/Throat: Oropharynx is clear and moist. No oropharyngeal exudate.  Eyes: Conjunctivae normal and EOM are normal. Pupils are equal, round, and reactive to light. No scleral icterus.  Neck: Normal range of motion. Neck supple.  Cardiovascular: Normal rate, regular rhythm, normal heart sounds and intact distal pulses.   No murmur heard. Pulmonary/Chest: Effort normal and breath sounds normal. No respiratory distress. He has no wheezes. He has no rales.  Musculoskeletal: He exhibits no edema.       Diabetic foot exam: Normal inspection No  skin breakdown No calluses  Normal DP/PT pulses Normal sensation to light touch and monofilament Nails normal  + pain at left sciatic notch, no pain at GTB or SIJ  Lymphadenopathy:    He has no cervical adenopathy.  Neurological: He has normal strength. No sensory deficit.       Diminished reflexes bilateral LE symmetrically. Strength, sensation intact. Able to heel and toe walk.  Skin: Skin is warm and dry. No rash noted.       2 cm raised lesion with rolling borders L lateral neck, central nonhealing ulcer  Psychiatric: He has a normal mood and affect.       Assessment & Plan:

## 2011-12-07 NOTE — Assessment & Plan Note (Signed)
Good control in office, however at home running consistently elevated. ?calibrated cuff at home.  Advised to check at store and let me know if also running elevated.  Consider addition of CCB.

## 2011-12-07 NOTE — Telephone Encounter (Signed)
Noted.  Let's start amlodipine at 5mg  daily - watch for leg swelling on this med.

## 2011-12-07 NOTE — Assessment & Plan Note (Signed)
Lumbar radiculopathy vs sciatica.  Check xrays today given longstanding and progressively worsening.  Per pt h/o HNP but unsure location. Also provided with stretching exercises from [facility] pt advisor on sciatica. NSAIDs contraindicated given recent GIB earlier this year as well as would want to avoid tylenol 2/2 cryptogenic cirrhosis.  Also want to avoid steroids 2/2 DM.  ?tramadol for pain.  Pt declines further eval, states not painful enough to consider ESI. Agrees start with l-spine xray series.

## 2011-12-08 ENCOUNTER — Encounter: Payer: Self-pay | Admitting: *Deleted

## 2011-12-08 NOTE — Telephone Encounter (Signed)
Message left for daughter and Mychart message sent.

## 2011-12-08 NOTE — Telephone Encounter (Signed)
Patient notified and will start med. He will call if any leg swelling noted.

## 2011-12-08 NOTE — Telephone Encounter (Signed)
[name] pts daughter left v/m pt got call back this AM but could not remember what nurse told pt. [name] request call back [phone].

## 2011-12-09 ENCOUNTER — Other Ambulatory Visit: Payer: Self-pay | Admitting: Dermatology

## 2011-12-09 ENCOUNTER — Encounter: Payer: Self-pay | Admitting: Family Medicine

## 2011-12-20 ENCOUNTER — Ambulatory Visit: Payer: Self-pay | Admitting: Specialist

## 2011-12-20 LAB — CREATININE, SERUM: EGFR (African American): >60

## 2011-12-24 ENCOUNTER — Other Ambulatory Visit: Payer: Self-pay | Admitting: Family Medicine

## 2012-01-01 ENCOUNTER — Other Ambulatory Visit: Payer: Self-pay | Admitting: Cardiovascular Disease

## 2012-01-03 ENCOUNTER — Other Ambulatory Visit: Payer: Self-pay

## 2012-01-03 MED ORDER — DABIGATRAN ETEXILATE MESYLATE 150 MG PO CAPS: 150.0000 mg | ORAL_CAPSULE | Freq: Two times a day (BID) | ORAL | Status: DC

## 2012-01-03 NOTE — Telephone Encounter (Signed)
Refill sent for pradaxa  

## 2012-01-07 ENCOUNTER — Encounter: Payer: Self-pay | Admitting: Cardiovascular Disease

## 2012-01-07 ENCOUNTER — Ambulatory Visit (INDEPENDENT_AMBULATORY_CARE_PROVIDER_SITE_OTHER): Payer: Medicare Other | Admitting: Cardiovascular Disease

## 2012-01-07 VITALS — BP 170/60 | HR 71 | Ht 68.0 in | Wt 220.5 lb

## 2012-01-07 DIAGNOSIS — I10 Essential (primary) hypertension: Secondary | ICD-10-CM

## 2012-01-07 DIAGNOSIS — E119 Type 2 diabetes mellitus without complications: Secondary | ICD-10-CM

## 2012-01-07 DIAGNOSIS — I4891 Unspecified atrial fibrillation: Secondary | ICD-10-CM

## 2012-01-07 DIAGNOSIS — E785 Hyperlipidemia, unspecified: Secondary | ICD-10-CM

## 2012-01-07 DIAGNOSIS — R0789 Other chest pain: Secondary | ICD-10-CM

## 2012-01-07 MED ORDER — AMLODIPINE BESYLATE 10 MG PO TABS: 10.0000 mg | ORAL_TABLET | Freq: Every day | ORAL | Status: DC

## 2012-01-07 NOTE — Assessment & Plan Note (Signed)
Cholesterol is very low on his current dose of statin. No changes made.

## 2012-01-07 NOTE — Assessment & Plan Note (Signed)
Heart rate is well controlled. He is on anticoagulation.

## 2012-01-07 NOTE — Assessment & Plan Note (Signed)
Blood pressure is poorly controlled. We have suggested he increase his amlodipine to 10 mg daily.

## 2012-01-07 NOTE — Patient Instructions (Addendum)
You are doing well. Take amlodipine 10 mg daily (take 2 of the 5 mg pills until you run out)  Please call us if you have new issues that need to be addressed before your next appt.  Your physician wants you to follow-up in: 6 months.  You will receive a reminder letter in the mail two months in advance. If you don't receive a letter, please call our office to schedule the follow-up appointment.

## 2012-01-07 NOTE — Progress Notes (Signed)
Patient ID: [name], male    DOB: August 11, 1933, 78 y.o.   MRN: [id number]  HPI Comments: Mr. Lamadrid is a very pleasant 78 your gentleman with a long history of smoking, suspected COPD, no known coronary artery disease,  last  echo in 2010 , who was  brought in by EMS to the hospital in mid May 2012 with respiratory distress, started on BiPAP with chest x-ray showing right lower lobe pneumonia, new atrial fibrillation. He was offered cardioversion is though declined and preferred medical management. He was admission to [facility] in early 2013 for right upper lobe pneumonia.  Admission 09/12/2011 for COPD exacerbation.  He has continued to decline cardioversion on his previous office visits. He preferred to stay on anticoagulation. Subsequent hospital admission at the beginning of June 27 2011  with GI bleed. admitted from June 1 to June 11. Upper endoscopy  Showed angiodysplasias with ablation, gastric varices. He was started on proton pump inhibitor. He was also treated for C. difficile . Hematocrit reached the low 20s on admission.  his anticoagulation was held at discharge . His dose of Coreg was also significantly decreased at discharge from 25 mg to 6.25 mg twice a day.  In followup today, he reports that he feels well. His weight continues to trend upwards. His blood pressure is elevated. Systolic pressures usually in the 150-160 range even after amlodipine started. He reports that he is off his diabetes medications. His legs are sometimes week and he has arthritis. Nodule in his liver with recent CT scan and MRI. Results are unavailable. No significant shortness of breath, chest pain or edema.  Echocardiogram May 2013 shows normal LV systolic function, greater than 55%, mildly dilated left atrium, no diastolic dysfunction  EKG today shows atrial fibrillation with ventricular rate 71 beats per minute with no significant ST or T wave changes   Outpatient Encounter Prescriptions as of 01/07/2012   Medication Sig Dispense Refill  . acetaminophen (TYLENOL) 325 MG tablet Take 325 mg by mouth every 6 (six) hours as needed.      . carvedilol (COREG) 12.5 MG tablet Take 1 tablet (12.5 mg total) by mouth 2 (two) times daily with a meal.  60 tablet  11  . Cyanocobalamin (B-12) 2500 MCG TABS Take 1 tablet by mouth daily.      . dabigatran (PRADAXA) 150 MG CAPS Take 1 capsule (150 mg total) by mouth every 12 (twelve) hours.  60 capsule  6  . ferrous sulfate (FERRO-BOB) 325 (65 FE) MG tablet Take 1 tablet (325 mg total) by mouth daily with breakfast.      . Fluticasone-Salmeterol (ADVAIR) 100-50 MCG/DOSE AEPB Inhale 1 puff into the lungs every 12 (twelve) hours.      . furosemide (LASIX) 20 MG tablet Take 1 tablet (20 mg total) by mouth 2 (two) times daily.  60 tablet  11  . losartan (COZAAR) 100 MG tablet Take 100 mg by mouth daily.       . Magnesium 400 MG CAPS Take 400 mg by mouth daily.  90 capsule  3  . ONE TOUCH LANCETS MISC One daily and as needed/ ICD code 250.00       . ONE TOUCH ULTRA TEST test strip TEST BLOOD SUGAR ONCE DAILY AS DIRECTED  100 each  11  . pantoprazole (PROTONIX) 40 MG tablet take 1 tablet by mouth once daily  30 tablet  11  . Respiratory Therapy Supplies (NEBULIZER) DEVI by Does not apply route. Use as  directed       . simvastatin (ZOCOR) 20 MG tablet take 1 tablet by mouth once daily  30 tablet  6  .  amLODipine (NORVASC) 5 MG tablet Take 1 tablet (5 mg total) by mouth daily.  30 tablet  11     Review of Systems  Constitutional: Negative.   HENT: Negative.   Eyes: Negative.   Respiratory: Negative.   Cardiovascular: Negative.   Gastrointestinal: Negative.   Musculoskeletal: Positive for gait problem.  Skin: Negative.   Neurological: Negative.   Hematological: Negative.   Psychiatric/Behavioral: Negative.   All other systems reviewed and are negative.    BP 170/60  Pulse 71  Ht 5\' 8"  (1.727 m)  Wt 220 lb 8 oz (100.018 kg)  BMI 33.53 kg/m2  Physical  Exam  Nursing note and vitals reviewed. Constitutional: He is oriented to person, place, and time. He appears well-developed and well-nourished.  HENT:  Head: Normocephalic.  Nose: Nose normal.  Mouth/Throat: Oropharynx is clear and moist.  Eyes: Conjunctivae normal are normal. Pupils are equal, round, and reactive to light.  Neck: Normal range of motion. Neck supple. No JVD present.  Cardiovascular: Normal rate, S1 normal, S2 normal, normal heart sounds and intact distal pulses.  An irregularly irregular rhythm present. Exam reveals no gallop and no friction rub.   No murmur heard. Pulmonary/Chest: Effort normal and breath sounds normal. No respiratory distress. He has no wheezes. He has no rales. He exhibits no tenderness.  Abdominal: Soft. Bowel sounds are normal. He exhibits no distension. There is no tenderness.  Musculoskeletal: Normal range of motion. He exhibits no edema and no tenderness.  Lymphadenopathy:    He has no cervical adenopathy.  Neurological: He is alert and oriented to person, place, and time. Coordination normal.  Skin: Skin is warm and dry. No rash noted. No erythema.  Psychiatric: He has a normal mood and affect. His behavior is normal. Judgment and thought content normal.           Assessment and Plan

## 2012-01-07 NOTE — Assessment & Plan Note (Signed)
We have encouraged continued exercise, careful diet management in an effort to lose weight. 

## 2012-01-14 ENCOUNTER — Other Ambulatory Visit: Payer: Self-pay

## 2012-01-14 MED ORDER — AMLODIPINE BESYLATE 10 MG PO TABS: 10.0000 mg | ORAL_TABLET | Freq: Every day | ORAL | Status: DC

## 2012-01-14 NOTE — Telephone Encounter (Signed)
Refill sent for amlodipine 10 mg   

## 2012-03-01 ENCOUNTER — Other Ambulatory Visit: Payer: Self-pay | Admitting: Family Medicine

## 2012-03-06 ENCOUNTER — Ambulatory Visit (INDEPENDENT_AMBULATORY_CARE_PROVIDER_SITE_OTHER): Payer: Medicare Other | Admitting: Gastroenterology

## 2012-03-06 ENCOUNTER — Other Ambulatory Visit: Payer: Medicare Other

## 2012-03-06 ENCOUNTER — Encounter: Payer: Self-pay | Admitting: Gastroenterology

## 2012-03-06 VITALS — BP 152/60 | HR 68 | Ht 68.0 in | Wt 227.0 lb

## 2012-03-06 DIAGNOSIS — K7469 Other cirrhosis of liver: Secondary | ICD-10-CM

## 2012-03-06 DIAGNOSIS — R933 Abnormal findings on diagnostic imaging of other parts of digestive tract: Secondary | ICD-10-CM

## 2012-03-06 DIAGNOSIS — I868 Varicose veins of other specified sites: Secondary | ICD-10-CM

## 2012-03-06 DIAGNOSIS — I864 Gastric varices: Secondary | ICD-10-CM

## 2012-03-06 DIAGNOSIS — K7460 Unspecified cirrhosis of liver: Secondary | ICD-10-CM

## 2012-03-06 NOTE — Patient Instructions (Addendum)
Your physician has requested that you go to the basement for the following lab work before leaving today: AFP.  We will call you in 6 months to schedule your follow-up liver MRI and office visit.   cc: Nash Mantis, MD  Thank you for choosing me and [facility].  Venita Lick. Pleas Koch., MD., Clementeen Graham

## 2012-03-06 NOTE — Progress Notes (Signed)
History of Present Illness: This is a 79 year old male with hx of cryptogenic cirrhosis with hx of gastric varices and several gastric AVMs treated with APC. He underwent on MRI at [facility] on 12/20/2011 showing a 7 mm enhancing lesion in the right hepatic lobe which may represent a flash filling hemangioma versus adenoma but malignancy cannot be excluded. Liver demonstrated a micronodular contour typical for cirrhosis. In addition a 4.5 cm right renal cyst was noted. Denies weight loss, abdominal pain, constipation, diarrhea, change in stool caliber, melena, hematochezia, nausea, vomiting, dysphagia, reflux symptoms, chest pain.  Current Medications, Allergies, Past Medical History, Past Surgical History, Family History and Social History were reviewed in [employer] record.  Physical Exam: General: Well developed , well nourished, obese, no acute distress Head: Normocephalic and atraumatic Eyes:  sclerae anicteric, EOMI Ears: Normal auditory acuity Mouth: No deformity or lesions Lungs: Clear throughout to auscultation Heart: Regular rate and rhythm; no murmurs, rubs or bruits Abdomen: Soft, non tender and non distended. No masses, hepatosplenomegaly or hernias noted. Normal Bowel sounds Musculoskeletal: Symmetrical with no gross deformities  Pulses:  Normal pulses noted Extremities: No clubbing, cyanosis, edema or deformities noted Neurological: Alert oriented x 4, grossly nonfocal Psychological:  Alert and cooperative. Normal mood and affect  Assessment and Recommendations:  1. Bleeding gastric AVM status post APC therapy no recurrent bleeding. I would recommend that he avoid all aspirin and NSAID products while on anticoagulants. He is advised to contact us or one of his physicians for any signs or symptoms of gastrointestinal bleeding.   2. Small duodenal nodule undoubtedly benign.   3. Cryptogenic cirrhosis with gastric varices. Although the varices have not bled  they are at higher risk of bleeding on anticoagulation. He is advised to contact us or one of his physicians for any signs or symptoms of gastrointestinal bleeding. Would like to start Naldolol to reduce portal pressures, if ok with his Cardiologist.  4. Liver lesion on MRI with findings typical for a small hemangioma. Adenoma and early malignancy could not be fully excluded. Check AFP. Plan for repeat MRI in 6 months.  5. History of anemia. On Fe. Most recent Hb was normal.

## 2012-03-07 ENCOUNTER — Other Ambulatory Visit: Payer: Self-pay

## 2012-03-07 DIAGNOSIS — K7460 Unspecified cirrhosis of liver: Secondary | ICD-10-CM

## 2012-03-14 ENCOUNTER — Telehealth: Payer: Self-pay

## 2012-03-14 MED ORDER — NADOLOL 20 MG PO TABS: 20.0000 mg | ORAL_TABLET | Freq: Every day | ORAL | Status: DC

## 2012-03-14 NOTE — Telephone Encounter (Signed)
I spoke with the patient's daughter jennifer.  She verbalized understanding of all instructions and that they should make a follow up with Dr. Mariah Milling to monitor BP and HR in the next few weeks

## 2012-03-14 NOTE — Telephone Encounter (Signed)
Message copied by Annett Fabian on Tue Mar 14, 2012  2:37 PM ------      Message from: Claudette Head T      Created: Tue Mar 14, 2012  2:14 PM             As per Dr. Windell Hummingbird recommendation please start Naldolol 20 mg po qam and stop Coreg. He should follow up with Dr. Mariah Milling or his PCP to monitor HR and BP on the new med.                   ----- Message -----         From: Antonieta Iba, MD         Sent: 03/13/2012   6:05 PM           To: Meryl Dare, MD,FACG            Sorry for the delay, got stuck in the snow  ;-)            Nadolol should be fine.      Could use a higher dose if needed as I need beta blocker for rate control for atrial fibrillation. Would probably help  blood pressure as well. I would stop coreg and substitute nadolol.       Perhaps we can monitor blood pressure and heart rate after start of the new medication and titrate as needed.      thx      Dossie Arbour            ----- Message -----         From: Meryl Dare, MD,FACG         Sent: 03/06/2012  12:00 PM           To: Antonieta Iba, MD            Dr. Mariah Milling, I would like to use Naldolol to help reduce portal pressure and therefore help to reduce risk of UGI bleeding in this patient, unless you feel he can has contraindications to a non selective beta blocker. Thanks. Claudette Head             ------

## 2012-03-14 NOTE — Telephone Encounter (Signed)
Patient notified, he wants me to speak with his daughter and he will have her call me back.

## 2012-03-29 ENCOUNTER — Other Ambulatory Visit: Payer: Self-pay | Admitting: Family Medicine

## 2012-04-06 ENCOUNTER — Encounter: Payer: Self-pay | Admitting: Family Medicine

## 2012-04-06 ENCOUNTER — Ambulatory Visit (INDEPENDENT_AMBULATORY_CARE_PROVIDER_SITE_OTHER): Payer: Medicare Other | Admitting: Family Medicine

## 2012-04-06 VITALS — BP 148/56 | HR 64 | Temp 97.7°F

## 2012-04-06 DIAGNOSIS — E119 Type 2 diabetes mellitus without complications: Secondary | ICD-10-CM

## 2012-04-06 DIAGNOSIS — I10 Essential (primary) hypertension: Secondary | ICD-10-CM

## 2012-04-06 DIAGNOSIS — R0609 Other forms of dyspnea: Secondary | ICD-10-CM

## 2012-04-06 MED ORDER — FUROSEMIDE 20 MG PO TABS: 20.0000 mg | ORAL_TABLET | Freq: Two times a day (BID) | ORAL | Status: DC

## 2012-04-06 MED ORDER — NADOLOL 40 MG PO TABS: 40.0000 mg | ORAL_TABLET | Freq: Every day | ORAL | Status: DC

## 2012-04-06 NOTE — Patient Instructions (Addendum)
Stop carvedilol Increase nadolol to 40mg  daily. Return in 4 months for follow up. Good to see you today, call us with questions. Schedule eye doctor appointment.

## 2012-04-06 NOTE — Assessment & Plan Note (Signed)
Chronic. Reviewed meds - accidentally was taking both nadolol and coreg.  Discussed this. Stop coreg, increase nadolol to 20mg  daily. Restart lasix. Advised to monitor blood pressure with change.

## 2012-04-06 NOTE — Assessment & Plan Note (Signed)
With weight gain, bibasilar crackles, pedal edema.  Anticipate due to fluid overload in h/o CHF.  Restart lasix (had accidentally stopped) and continue to monitor.

## 2012-04-06 NOTE — Progress Notes (Signed)
Subjective:    Patient ID: [name], male    DOB: January 07, 1934, 79 y.o.   MRN: [id number]  HPI CC: 4 mo f/u  Mr. [name] is a pleasant 79 yo with complicated history including bladder cancer s/p ileal conduit and ileostomy, cryptogenic cirrhosis, T2DM, HTN, HLD and COPD (2-3L O2 by [place] at night --> hasn't been using recently), chronic afib, and grade 1 dCHF, h/o GIB now back on pradaxa, who presents today for DM f/u.  Since last seen here, saw Dr. Russella Dar who decided to do trial of nadolol intead of carvedilol.  Misunderstanding - pt stopped lasix instead of stopping coreg.  Discussed this.    DM - brings log of sugars.  Fasting 100-140s, one isolated high to 204.  Due for vision exam. Lab Results  Component Value Date   HGBA1C 5.4 12/07/2011    HTN - No HA, vision changes, CP/tightness, SOB.  Brings log of BP - 150-160/70-80s, HR 50-70s. Compliant with meds.  Confusion about beta blocker - see ablve.  COPD - doesn't regularly use oxygen.  2L.  Brings pulse ox showing 88-90s % Cough - for last 2 weeks.  Rattly.  Not purulent sputum.  Noted increased leg swelling.  Noted stable dyspnea. No fevers/chills   Wt Readings from Last 3 Encounters:  03/06/12 227 lb (102.967 kg)  01/07/12 220 lb 8 oz (100.018 kg)  12/07/11 216 lb 8 oz (98.204 kg)   BP Readings from Last 3 Encounters:  04/06/12 132/68  03/06/12 152/60  01/07/12 170/60   Past Medical History  Diagnosis Date  . Diabetes mellitus, type 2   . Hyperlipidemia   . Hypertension   . Cryptogenic cirrhosis     Followed by Dr Russella Dar, never biopsied, has been stable.  History of gastric varices.  . Pyloric stenosis     mild EGD 10/26/06  . Inguinal hernia     left  . Iron deficiency anemia   . Hemorrhoids   . B12 deficiency   . COPD (chronic obstructive pulmonary disease)     (Dr. Meredeth Ide)  . History of ileostomy   . History of pneumonia 2012  . Shortness of breath   . Chronic a-fib     on pradaxa, declined cardioversion in past   . Bladder cancer 1984    s/p ileal conduit  . History of bladder cancer   . GI bleed   . CHF (congestive heart failure)     diastolic. Echo 5/10 with mild LVH, EF 75%, grade 1 diastolic dysfunction, severe left atrial enlargement, aortic sclerosis  . Gastric varices   . Arthritis   . Pinched nerve     back     Review of Systems Per HPI    Objective:   Physical Exam  Nursing note and vitals reviewed. Constitutional: He appears well-developed and well-nourished. No distress.  HENT:  Head: Normocephalic and atraumatic.  Mouth/Throat: Oropharynx is clear and moist. No oropharyngeal exudate.  Eyes: Conjunctivae are normal. Pupils are equal, round, and reactive to light. No scleral icterus.  Neck: Normal range of motion. Neck supple.  Cardiovascular: Normal rate, regular rhythm, normal heart sounds and intact distal pulses.   No murmur heard. Pulmonary/Chest: Effort normal and breath sounds normal. No respiratory distress. He has no wheezes. He has no rales.  Bibasilar crackles  Abdominal: Soft. Bowel sounds are normal. He exhibits distension (mild). He exhibits no mass. There is no tenderness. There is no rebound and no guarding.  Musculoskeletal: He exhibits edema (1+  pitting edema).  Skin: Skin is warm and dry.       Assessment & Plan:

## 2012-04-06 NOTE — Assessment & Plan Note (Signed)
Chronic. Continue to monitor off meds. Anticipate A1c falsely low compared to cbg's he brings but overall ok readings.

## 2012-07-05 ENCOUNTER — Encounter: Payer: Self-pay | Admitting: Cardiovascular Disease

## 2012-07-05 ENCOUNTER — Ambulatory Visit (INDEPENDENT_AMBULATORY_CARE_PROVIDER_SITE_OTHER): Payer: Medicare Other | Admitting: Cardiovascular Disease

## 2012-07-05 VITALS — BP 160/60 | HR 78 | Ht 69.0 in | Wt 229.5 lb

## 2012-07-05 DIAGNOSIS — E785 Hyperlipidemia, unspecified: Secondary | ICD-10-CM

## 2012-07-05 DIAGNOSIS — R609 Edema, unspecified: Secondary | ICD-10-CM

## 2012-07-05 DIAGNOSIS — R600 Localized edema: Secondary | ICD-10-CM

## 2012-07-05 DIAGNOSIS — I10 Essential (primary) hypertension: Secondary | ICD-10-CM

## 2012-07-05 DIAGNOSIS — R002 Palpitations: Secondary | ICD-10-CM

## 2012-07-05 DIAGNOSIS — I4891 Unspecified atrial fibrillation: Secondary | ICD-10-CM

## 2012-07-05 MED ORDER — CLONIDINE HCL 0.1 MG PO TABS: 0.1000 mg | ORAL_TABLET | Freq: Two times a day (BID) | ORAL | Status: DC

## 2012-07-05 NOTE — Assessment & Plan Note (Signed)
He has converted to normal sinus rhythm. No changes made to his medications. We'll continue anticoagulation

## 2012-07-05 NOTE — Patient Instructions (Addendum)
Please cut the norvasc in 1/2 daily Please start the clonidine and take one pill twice a day  If leg swelling persists, call the office We would hold the amlodipine and increase the dose of the clonidine  Please call us if you have new issues that need to be addressed before your next appt.  Your physician wants you to follow-up in: 6 months.  You will receive a reminder letter in the mail two months in advance. If you don't receive a letter, please call our office to schedule the follow-up appointment.

## 2012-07-05 NOTE — Assessment & Plan Note (Signed)
Cholesterol was very low last year. We'll wait to see this years repeat. Continue low-dose simvastatin for now

## 2012-07-05 NOTE — Assessment & Plan Note (Signed)
Blood pressure continues to run high by his numbers. Typically 150-160 systolic. We will add clonidine 0.1 mg twice a day, decrease the amlodipine for leg edema down to 5 mg daily. If no side effects, clonidine to be increased slowly.

## 2012-07-05 NOTE — Assessment & Plan Note (Signed)
Etiology of his leg edema is not clear. Clinical exam suggestive of venous insufficiency. Possible side effect from amlodipine. We have suggested he decrease the amlodipine to 5 mg. If edema starts to improve, we may need to wean the medication altogether.

## 2012-07-05 NOTE — Progress Notes (Signed)
Patient ID: [name], male    DOB: 21-Jun-1933, 79 y.o.   MRN: [id number]  HPI Comments: [name] is a very pleasant 46 your gentleman with a long history of smoking, suspected COPD, no known coronary artery disease,  last  echo in 2010 , who was  brought in by EMS to the hospital in mid May 2012 with respiratory distress, started on BiPAP with chest x-ray showing right lower lobe pneumonia, new atrial fibrillation. He was offered cardioversion though he declined and preferred medical management.  admission to [facility] in early 2013 for right upper lobe pneumonia.  Admission 09/12/2011 for COPD exacerbation.   hospital admission June 27 2011  with GI bleed.  Upper endoscopy Showed angiodysplasias with ablation, gastric varices. He was started on proton pump inhibitor. He was also treated for C. difficile . Hematocrit reached  low 20s on admission.  his anticoagulation was held at discharge . His dose of Coreg was also significantly decreased at discharge from 25 mg to 6.25 mg twice a day.  Today he presents and is no longer on carvedilol, he is on Nadolol. He has a thick cough, otherwise feels well. He has pages of blood pressures and typically he runs 150-160 systolic a regular basis. He complains of chronic lower extremity edema worse after he gets out of bed.  Echocardiogram May 2013 shows normal LV systolic function, greater than 55%, mildly dilated left atrium, no diastolic dysfunction  EKG today shows normal sinus rhythm with first degree AV block, rate 78 beats per minute, nonspecific T wave abnormality in the inferior leads Prior EKG showed atrial fibrillation   Outpatient Encounter Prescriptions as of 07/05/2012  Medication Sig Dispense Refill  . acetaminophen (TYLENOL) 325 MG tablet Take 325 mg by mouth every 6 (six) hours as needed.      Marland Kitchen albuterol (PROVENTIL HFA;VENTOLIN HFA) 108 (90 BASE) MCG/ACT inhaler Inhale 2 puffs into the lungs every 6 (six) hours as needed for wheezing.      Marland Kitchen  amLODipine (NORVASC) 10 MG tablet Take 1 tablet (10 mg total) by mouth daily.  30 tablet  11  . Cyanocobalamin (B-12) 2500 MCG TABS Take 1 tablet by mouth daily.      . dabigatran (PRADAXA) 150 MG CAPS Take 1 capsule (150 mg total) by mouth every 12 (twelve) hours.  60 capsule  6  . ferrous sulfate (FERRO-BOB) 325 (65 FE) MG tablet Take 1 tablet (325 mg total) by mouth daily with breakfast.      . furosemide (LASIX) 20 MG tablet Take 1 tablet (20 mg total) by mouth 2 (two) times daily.  60 tablet  11  . losartan (COZAAR) 100 MG tablet take 1 tablet by mouth once daily  90 tablet  3  . Magnesium 400 MG CAPS Take 400 mg by mouth daily.  90 capsule  3  . nadolol (CORGARD) 40 MG tablet Take 1 tablet (40 mg total) by mouth daily.  30 tablet  11  . ONE TOUCH LANCETS MISC One daily and as needed/ ICD code 250.00       . ONE TOUCH ULTRA TEST test strip TEST as directed  100 each  11  . pantoprazole (PROTONIX) 40 MG tablet take 1 tablet by mouth once daily  30 tablet  11  . Respiratory Therapy Supplies (NEBULIZER) DEVI by Does not apply route. Use as directed       . simvastatin (ZOCOR) 20 MG tablet take 1 tablet by mouth once daily  30 tablet  6  . cloNIDine (CATAPRES) 0.1 MG tablet Take 1 tablet (0.1 mg total) by mouth 2 (two) times daily.  60 tablet  11    Review of Systems  Constitutional: Negative.   HENT: Negative.   Eyes: Negative.   Respiratory: Positive for cough.   Cardiovascular: Positive for leg swelling.  Gastrointestinal: Negative.   Musculoskeletal: Positive for gait problem.  Skin: Negative.   Neurological: Negative.   Psychiatric/Behavioral: Negative.   All other systems reviewed and are negative.    BP 160/60  Pulse 78  Ht 5\' 9"  (1.753 m)  Wt 229 lb 8 oz (104.101 kg)  BMI 33.88 kg/m2  Physical Exam  Nursing note and vitals reviewed. Constitutional: He is oriented to person, place, and time. He appears well-developed and well-nourished.  HENT:  Head: Normocephalic.   Nose: Nose normal.  Mouth/Throat: Oropharynx is clear and moist.  Eyes: Conjunctivae are normal. Pupils are equal, round, and reactive to light.  Neck: Normal range of motion. Neck supple. No JVD present.  Cardiovascular: Normal rate, S1 normal, S2 normal, normal heart sounds and intact distal pulses.  An irregularly irregular rhythm present. Exam reveals no gallop and no friction rub.   No murmur heard. Pulmonary/Chest: Effort normal and breath sounds normal. No respiratory distress. He has no wheezes. He has no rales. He exhibits no tenderness.  Abdominal: Soft. Bowel sounds are normal. He exhibits no distension. There is no tenderness.  Musculoskeletal: Normal range of motion. He exhibits edema. He exhibits no tenderness.  Lymphadenopathy:    He has no cervical adenopathy.  Neurological: He is alert and oriented to person, place, and time. Coordination normal.  Skin: Skin is warm and dry. No rash noted. No erythema.  Psychiatric: He has a normal mood and affect. His behavior is normal. Judgment and thought content normal.      Assessment and Plan

## 2012-07-26 ENCOUNTER — Other Ambulatory Visit: Payer: Self-pay | Admitting: Family Medicine

## 2012-08-03 ENCOUNTER — Other Ambulatory Visit: Payer: Self-pay

## 2012-08-07 ENCOUNTER — Encounter: Payer: Self-pay | Admitting: Family Medicine

## 2012-08-07 ENCOUNTER — Ambulatory Visit (INDEPENDENT_AMBULATORY_CARE_PROVIDER_SITE_OTHER): Payer: Medicare Other | Admitting: Family Medicine

## 2012-08-07 VITALS — BP 140/80 | HR 60 | Temp 98.2°F | Wt 227.5 lb

## 2012-08-07 DIAGNOSIS — I10 Essential (primary) hypertension: Secondary | ICD-10-CM

## 2012-08-07 DIAGNOSIS — K7469 Other cirrhosis of liver: Secondary | ICD-10-CM

## 2012-08-07 DIAGNOSIS — J449 Chronic obstructive pulmonary disease, unspecified: Secondary | ICD-10-CM

## 2012-08-07 DIAGNOSIS — K7460 Unspecified cirrhosis of liver: Secondary | ICD-10-CM

## 2012-08-07 DIAGNOSIS — J4489 Other specified chronic obstructive pulmonary disease: Secondary | ICD-10-CM

## 2012-08-07 DIAGNOSIS — I4891 Unspecified atrial fibrillation: Secondary | ICD-10-CM

## 2012-08-07 DIAGNOSIS — E119 Type 2 diabetes mellitus without complications: Secondary | ICD-10-CM

## 2012-08-07 LAB — FRUCTOSAMINE: Fructosamine: 318 umol/L — ABNORMAL HIGH (ref <285)

## 2012-08-07 MED ORDER — CLONIDINE HCL 0.2 MG PO TABS: 0.2000 mg | ORAL_TABLET | Freq: Two times a day (BID) | ORAL | Status: DC

## 2012-08-07 NOTE — Assessment & Plan Note (Signed)
AM O2 too low - recommended again he use night time oxygen.

## 2012-08-07 NOTE — Assessment & Plan Note (Signed)
Amlodipine likely caused ankle swelling- continue titration of clonidine to 0.2mg  bid. Stop amlodipine.

## 2012-08-07 NOTE — Assessment & Plan Note (Signed)
Sounds regular today as well.  Continue to monitor. Continue pradaxa.

## 2012-08-07 NOTE — Progress Notes (Signed)
  Subjective:    Patient ID: [name], male    DOB: 02/04/1933, 79 y.o.   MRN: [id number]  HPI CC: 4 mo f/u  [name] is a pleasant 79 yo with complicated history including bladder cancer s/p ileal conduit and ileostomy, cryptogenic cirrhosis, T2DM, HTN, HLD and COPD (2-3L O2 by [place] at night --> hasn't been using recently), chronic afib, and grade 1 dCHF, h/o GIB now back on pradaxa, who presents today for DM f/u.  Weights over last few weeks - 224-228lb.   GI - Dr. Russella [name].  DM - fasting sugars are 110-130s.  Pneumovax 2006 completed.  Foot exam today.   Lab Results  Component Value Date   HGBA1C 5.4 12/07/2011    HTN - running well controlled at home.  Recently saw cardiology, amlodipine was weaned down and pt noticed ankle swelling has improved with this change.  Started on clonidine 0.1mg  bid.  No HA, vision changes, CP/tightness, SOB, leg swelling.  Bring log of blood pressure 150-160/70s  COPD - prescribed O2 at night time but doesn't bring - brings log of O2 sats first check in am mid 80s, one 79%.    Past Medical History  Diagnosis Date  . Diabetes mellitus, type 2   . Hyperlipidemia   . Hypertension   . Cryptogenic cirrhosis     Followed by Dr [name], never biopsied, has been stable.  History of gastric varices.  . Pyloric stenosis     mild EGD 10/26/06  . Inguinal hernia     left  . Iron deficiency anemia   . Hemorrhoids   . B12 deficiency   . COPD (chronic obstructive pulmonary disease)     (Dr. Meredeth [name])  . History of ileostomy   . History of pneumonia 2012  . Shortness of breath   . Chronic a-fib     on pradaxa, declined cardioversion in past  . Bladder cancer 1984    s/p ileal conduit  . History of bladder cancer   . GI bleed   . CHF (congestive heart failure)     diastolic. Echo 5/10 with mild LVH, EF 75%, grade 1 diastolic dysfunction, severe left atrial enlargement, aortic sclerosis  . Gastric varices   . Arthritis   . Pinched nerve     back      Review of Systems Per HPI     Objective:   Physical Exam  Nursing note and vitals reviewed. Constitutional: He appears well-developed and well-nourished. No distress.  HENT:  Head: Normocephalic and atraumatic.  Nose: Nose normal.  Mouth/Throat: Oropharynx is clear and moist. No oropharyngeal exudate.  Eyes: Conjunctivae and EOM are normal. Pupils are equal, round, and reactive to light. No scleral icterus.  Neck: Normal range of motion. Neck supple.  Cardiovascular: Normal rate, regular rhythm, normal heart sounds and intact distal pulses.   No murmur heard. Pulmonary/Chest: Effort normal and breath sounds normal. No respiratory distress. He has no wheezes. He has no rales.  Musculoskeletal: He exhibits no edema.  Diabetic foot exam: Normal inspection No skin breakdown No calluses  Normal DP/PT pulses Normal sensation to light touch and monofilament Nails slightly thickened  Lymphadenopathy:    He has no cervical adenopathy.  Skin: Skin is warm and dry. No rash noted.  Psychiatric: He has a normal mood and affect.       Assessment & Plan:

## 2012-08-07 NOTE — Assessment & Plan Note (Signed)
?  falsely low A1c - recheck today. Some elevated fasting sugars.  Check fructosamine today.

## 2012-08-07 NOTE — Patient Instructions (Addendum)
Let's stop amlodipine (norvasc) and increase clonidine to 0.2mg  twice daily (may take 2 pills twice daily until you run out, then new dose will be 1 pill twice daily).   Start using oxygen at night at 2-3 L, as your oxygen levels are too low when you wake up. Schedule eye doctor appointment. Blood work today. Return in 6 months for medicare wellness visit, prior if needed Blood work today.

## 2012-08-07 NOTE — Assessment & Plan Note (Signed)
Check CMP today 

## 2012-08-08 LAB — HEMOGLOBIN A1C: Hgb A1c MFr Bld: 6.3 % (ref 4.6–6.5)

## 2012-08-08 LAB — COMPREHENSIVE METABOLIC PANEL
AST: 33 U/L (ref 0–37)
Alkaline Phosphatase: 94 U/L (ref 39–117)
BUN: 16 mg/dL (ref 6–23)
Glucose, Bld: 84 mg/dL (ref 70–99)
Total Bilirubin: 0.9 mg/dL (ref 0.3–1.2)

## 2012-08-08 LAB — MICROALBUMIN / CREATININE URINE RATIO
Creatinine,U: 56 mg/dL
Microalb Creat Ratio: 16.8 mg/g (ref 0.0–30.0)
Microalb, Ur: 9.4 mg/dL — ABNORMAL HIGH (ref 0.0–1.9)

## 2012-08-22 ENCOUNTER — Other Ambulatory Visit: Payer: Self-pay | Admitting: Cardiovascular Disease

## 2012-09-01 ENCOUNTER — Telehealth: Payer: Self-pay | Admitting: *Deleted

## 2012-09-01 NOTE — Telephone Encounter (Signed)
Spoke with patient's daughter, pt needs pradaxa 150mg  samples.  Told daughter we would leave 2 boxes at front desk for him to pick up today.  Please process request.

## 2012-09-01 NOTE — Telephone Encounter (Signed)
[name] at 09/01/2012 12:37 PM   Status: Signed            Spoke with patient's daughter, pt needs pradaxa 150mg  samples. Told daughter we would leave 2 boxes at front desk for him to pick up today. Please process request.   Spoke with pt and he is aware that samples of Pradaxa have been placed at front desk for him to pick up.

## 2012-09-06 ENCOUNTER — Other Ambulatory Visit: Payer: Medicare Other

## 2012-09-06 DIAGNOSIS — K7460 Unspecified cirrhosis of liver: Secondary | ICD-10-CM

## 2012-09-06 LAB — AFP TUMOR MARKER: AFP-Tumor Marker: 3.3 ng/mL (ref 0.0–8.0)

## 2012-10-13 ENCOUNTER — Other Ambulatory Visit: Payer: Self-pay | Admitting: Family Medicine

## 2012-11-07 ENCOUNTER — Other Ambulatory Visit: Payer: Self-pay | Admitting: Dermatology

## 2012-11-30 ENCOUNTER — Other Ambulatory Visit: Payer: Self-pay

## 2012-12-02 ENCOUNTER — Other Ambulatory Visit: Payer: Self-pay | Admitting: Family Medicine

## 2013-01-05 ENCOUNTER — Ambulatory Visit: Payer: Medicare Other | Admitting: Cardiovascular Disease

## 2013-01-08 ENCOUNTER — Encounter: Payer: Self-pay | Admitting: Cardiovascular Disease

## 2013-01-08 ENCOUNTER — Ambulatory Visit (INDEPENDENT_AMBULATORY_CARE_PROVIDER_SITE_OTHER): Payer: Medicare Other | Admitting: Cardiovascular Disease

## 2013-01-08 VITALS — BP 142/82 | HR 80 | Ht 69.0 in | Wt 223.5 lb

## 2013-01-08 DIAGNOSIS — I5030 Unspecified diastolic (congestive) heart failure: Secondary | ICD-10-CM

## 2013-01-08 DIAGNOSIS — I4891 Unspecified atrial fibrillation: Secondary | ICD-10-CM

## 2013-01-08 DIAGNOSIS — E119 Type 2 diabetes mellitus without complications: Secondary | ICD-10-CM

## 2013-01-08 DIAGNOSIS — I10 Essential (primary) hypertension: Secondary | ICD-10-CM

## 2013-01-08 DIAGNOSIS — R0789 Other chest pain: Secondary | ICD-10-CM

## 2013-01-08 DIAGNOSIS — E785 Hyperlipidemia, unspecified: Secondary | ICD-10-CM

## 2013-01-08 DIAGNOSIS — Z7901 Long term (current) use of anticoagulants: Secondary | ICD-10-CM

## 2013-01-08 DIAGNOSIS — I509 Heart failure, unspecified: Secondary | ICD-10-CM

## 2013-01-08 MED ORDER — DOXAZOSIN MESYLATE 8 MG PO TABS: 8.0000 mg | ORAL_TABLET | Freq: Every day | ORAL | Status: DC

## 2013-01-08 NOTE — Assessment & Plan Note (Signed)
Sugars have been running higher recently, likely from weight gain, overeating. Encouraged him to watch his diet

## 2013-01-08 NOTE — Assessment & Plan Note (Signed)
Appears relatively euvolemic on today's visit

## 2013-01-08 NOTE — Assessment & Plan Note (Signed)
Appears to be maintaining normal sinus rhythm, prolonged first degree AV block, asymptomatic

## 2013-01-08 NOTE — Patient Instructions (Signed)
You are doing well. Please start cardura at home, 1/2 pill to start for 1 to 2 weeks Monitor your blood pressure If it continue to run high, take cardura 1/2 pill twice a day  Please call us if you have new issues that need to be addressed before your next appt.  Your physician wants you to follow-up in: 6 months.  You will receive a reminder letter in the mail two months in advance. If you don't receive a letter, please call our office to schedule the follow-up appointment.

## 2013-01-08 NOTE — Assessment & Plan Note (Signed)
Tolerating anticoagulation with no recurrent bleeding

## 2013-01-08 NOTE — Assessment & Plan Note (Signed)
Blood pressures running high. We will start doxazosin 4 mg every evening. If he continues to run high, we'll increase this to 4 mg twice a day. We'll try to avoid calcium channel blockers

## 2013-01-08 NOTE — Assessment & Plan Note (Signed)
Encouraged him to stay on his simvastatin. 

## 2013-01-08 NOTE — Progress Notes (Signed)
Patient ID: [name], male    DOB: November 24, 1933, 79 y.o.   MRN: [id number]  HPI Comments: [name] is a very pleasant 79 yo gentleman with a long history of smoking, suspected COPD, no known coronary artery disease,  last  echo in 2010 , who was  brought in by EMS to the hospital in mid May 2012 with respiratory distress, started on BiPAP with chest x-ray showing right lower lobe pneumonia, new atrial fibrillation. He was offered cardioversion though he declined and preferred medical management.  admission to [facility] in early 2013 for right upper lobe pneumonia.  Admission 09/12/2011 for COPD exacerbation.   hospital admission June 27 2011  with GI bleed.  Upper endoscopy Showed angiodysplasias with ablation, gastric varices. He was started on proton pump inhibitor. He was also treated for C. difficile . Hematocrit reached  low 20s on admission.  his anticoagulation was held at discharge . His dose of Coreg was also significantly decreased at discharge from 25 mg to 6.25 mg twice a day.  Today he reports that overall he is doing well. Weight is up from overeating through the holidays. He has occasional chills and feels cold down to the bone. His blood pressures  typically run 150-160 systolic a regular basis. Amlodipine held previously for leg swelling. Leg swelling resolved.  Echocardiogram May 2013 shows normal LV systolic function, greater than 55%, mildly dilated left atrium, no diastolic dysfunction  EKG today shows normal sinus rhythm with significant first degree AV block, rate 80  beats per minute, nonspecific T wave abnormality in the anterolateral and inferior leads    Outpatient Encounter Prescriptions as of 01/08/2013  Medication Sig  . acetaminophen (TYLENOL) 325 MG tablet Take 325 mg by mouth every 6 (six) hours as needed.  Marland Kitchen albuterol (PROVENTIL HFA;VENTOLIN HFA) 108 (90 BASE) MCG/ACT inhaler Inhale 2 puffs into the lungs every 6 (six) hours as needed for wheezing.  . cloNIDine  (CATAPRES) 0.2 MG tablet Take 1 tablet (0.2 mg total) by mouth 2 (two) times daily.  . Cyanocobalamin (B-12) 2500 MCG TABS Take 1 tablet by mouth daily.  . ferrous sulfate (FERRO-BOB) 325 (65 FE) MG tablet Take 1 tablet (325 mg total) by mouth daily with breakfast.  . furosemide (LASIX) 20 MG tablet Take 1 tablet (20 mg total) by mouth 2 (two) times daily.  Marland Kitchen losartan (COZAAR) 100 MG tablet take 1 tablet by mouth once daily  . magnesium oxide (MAG-OX) 400 (241.3 MG) MG tablet take 1 tablet by mouth once daily  . nadolol (CORGARD) 40 MG tablet Take 1 tablet (40 mg total) by mouth daily.  . ONE TOUCH LANCETS MISC One daily and as needed/ ICD code 250.00   . ONE TOUCH ULTRA TEST test strip TEST as directed  . pantoprazole (PROTONIX) 40 MG tablet take 1 tablet by mouth once daily  . PRADAXA 150 MG CAPS take 1 capsule by mouth every 12 hours  . Respiratory Therapy Supplies (NEBULIZER) DEVI by Does not apply route. Use as directed   . simvastatin (ZOCOR) 20 MG tablet take 1 tablet by mouth once daily     Review of Systems  Constitutional: Negative.   HENT: Negative.   Eyes: Negative.   Respiratory: Positive for cough.   Gastrointestinal: Negative.   Endocrine: Negative.   Musculoskeletal: Positive for gait problem.  Skin: Negative.   Allergic/Immunologic: Negative.   Neurological: Negative.   Hematological: Negative.   Psychiatric/Behavioral: Negative.   All other systems reviewed and are  negative.    BP 142/82  Pulse 80  Ht 5\' 9"  (1.753 m)  Wt 223 lb 8 oz (101.379 kg)  BMI 32.99 kg/m2  Physical Exam  Nursing note and vitals reviewed. Constitutional: He is oriented to person, place, and time. He appears well-developed and well-nourished.  HENT:  Head: Normocephalic.  Nose: Nose normal.  Mouth/Throat: Oropharynx is clear and moist.  Eyes: Conjunctivae are normal. Pupils are equal, round, and reactive to light.  Neck: Normal range of motion. Neck supple. No JVD present.   Cardiovascular: Normal rate, S1 normal, S2 normal, normal heart sounds and intact distal pulses.  An irregularly irregular rhythm present. Exam reveals no gallop and no friction rub.   No murmur heard. Pulmonary/Chest: Effort normal and breath sounds normal. No respiratory distress. He has no wheezes. He has no rales. He exhibits no tenderness.  Abdominal: Soft. Bowel sounds are normal. He exhibits no distension. There is no tenderness.  Musculoskeletal: Normal range of motion. He exhibits no edema and no tenderness.  Lymphadenopathy:    He has no cervical adenopathy.  Neurological: He is alert and oriented to person, place, and time. Coordination normal.  Skin: Skin is warm and dry. No rash noted. No erythema.  Psychiatric: He has a normal mood and affect. His behavior is normal. Judgment and thought content normal.      Assessment and Plan

## 2013-01-27 ENCOUNTER — Encounter: Payer: Self-pay | Admitting: Family Medicine

## 2013-01-27 ENCOUNTER — Other Ambulatory Visit: Payer: Self-pay | Admitting: Family Medicine

## 2013-01-27 DIAGNOSIS — K7469 Other cirrhosis of liver: Secondary | ICD-10-CM

## 2013-01-27 DIAGNOSIS — E119 Type 2 diabetes mellitus without complications: Secondary | ICD-10-CM

## 2013-01-27 DIAGNOSIS — D509 Iron deficiency anemia, unspecified: Secondary | ICD-10-CM

## 2013-01-27 DIAGNOSIS — I10 Essential (primary) hypertension: Secondary | ICD-10-CM

## 2013-01-27 DIAGNOSIS — E785 Hyperlipidemia, unspecified: Secondary | ICD-10-CM

## 2013-02-02 ENCOUNTER — Other Ambulatory Visit (INDEPENDENT_AMBULATORY_CARE_PROVIDER_SITE_OTHER): Payer: Medicare Other

## 2013-02-02 DIAGNOSIS — I10 Essential (primary) hypertension: Secondary | ICD-10-CM

## 2013-02-02 DIAGNOSIS — D509 Iron deficiency anemia, unspecified: Secondary | ICD-10-CM

## 2013-02-02 DIAGNOSIS — K7469 Other cirrhosis of liver: Secondary | ICD-10-CM

## 2013-02-02 DIAGNOSIS — D518 Other vitamin B12 deficiency anemias: Secondary | ICD-10-CM

## 2013-02-02 DIAGNOSIS — K7460 Unspecified cirrhosis of liver: Secondary | ICD-10-CM

## 2013-02-02 DIAGNOSIS — I4891 Unspecified atrial fibrillation: Secondary | ICD-10-CM

## 2013-02-02 DIAGNOSIS — E785 Hyperlipidemia, unspecified: Secondary | ICD-10-CM

## 2013-02-02 DIAGNOSIS — E119 Type 2 diabetes mellitus without complications: Secondary | ICD-10-CM

## 2013-02-02 LAB — IBC PANEL
IRON: 108 ug/dL (ref 42–165)
Saturation Ratios: 28.5 % (ref 20.0–50.0)
Transferrin: 270.3 mg/dL (ref 212.0–360.0)

## 2013-02-02 LAB — CBC WITH DIFFERENTIAL/PLATELET
Basophils Absolute: 0 10*3/uL (ref 0.0–0.1)
Basophils Relative: 0.5 % (ref 0.0–3.0)
EOS ABS: 0.3 10*3/uL (ref 0.0–0.7)
Eosinophils Relative: 5 % (ref 0.0–5.0)
HCT: 37.6 % — ABNORMAL LOW (ref 39.0–52.0)
Hemoglobin: 13 g/dL (ref 13.0–17.0)
Lymphocytes Relative: 24 % (ref 12.0–46.0)
Lymphs Abs: 1.5 10*3/uL (ref 0.7–4.0)
MCHC: 34.6 g/dL (ref 30.0–36.0)
MCV: 90.3 fl (ref 78.0–100.0)
MONO ABS: 0.5 10*3/uL (ref 0.1–1.0)
Monocytes Relative: 8.6 % (ref 3.0–12.0)
NEUTROS PCT: 61.9 % (ref 43.0–77.0)
Neutro Abs: 3.9 10*3/uL (ref 1.4–7.7)
PLATELETS: 122 10*3/uL — AB (ref 150.0–400.0)
RBC: 4.17 Mil/uL — ABNORMAL LOW (ref 4.22–5.81)
RDW: 14.1 % (ref 11.5–14.6)
WBC: 6.3 10*3/uL (ref 4.5–10.5)

## 2013-02-02 LAB — LIPID PANEL
CHOL/HDL RATIO: 4
Cholesterol: 115 mg/dL (ref 0–200)
HDL: 31.4 mg/dL — AB (ref >39.00)
LDL Cholesterol: 65 mg/dL (ref 0–99)
Triglycerides: 94 mg/dL (ref 0.0–149.0)
VLDL: 18.8 mg/dL (ref 0.0–40.0)

## 2013-02-02 LAB — COMPREHENSIVE METABOLIC PANEL
ALBUMIN: 4 g/dL (ref 3.5–5.2)
ALK PHOS: 89 U/L (ref 39–117)
ALT: 29 U/L (ref 0–53)
AST: 33 U/L (ref 0–37)
BILIRUBIN TOTAL: 1 mg/dL (ref 0.3–1.2)
BUN: 19 mg/dL (ref 6–23)
CO2: 30 meq/L (ref 19–32)
Calcium: 9.2 mg/dL (ref 8.4–10.5)
Chloride: 110 mEq/L (ref 96–112)
Creatinine, Ser: 1.2 mg/dL (ref 0.4–1.5)
GFR: 62.3 mL/min (ref >60.00)
GLUCOSE: 145 mg/dL — AB (ref 70–99)
POTASSIUM: 4.2 meq/L (ref 3.5–5.1)
Sodium: 144 mEq/L (ref 135–145)
TOTAL PROTEIN: 7.1 g/dL (ref 6.0–8.3)

## 2013-02-02 LAB — HEMOGLOBIN A1C: HEMOGLOBIN A1C: 6.9 % — AB (ref 4.6–6.5)

## 2013-02-02 LAB — PROTIME-INR
INR: 1.4 ratio — ABNORMAL HIGH (ref 0.8–1.0)
PROTHROMBIN TIME: 14.7 s — AB (ref 10.2–12.4)

## 2013-02-02 LAB — FERRITIN: Ferritin: 64.7 ng/mL (ref 22.0–322.0)

## 2013-02-09 ENCOUNTER — Encounter: Payer: Self-pay | Admitting: Family Medicine

## 2013-02-09 ENCOUNTER — Ambulatory Visit (INDEPENDENT_AMBULATORY_CARE_PROVIDER_SITE_OTHER): Payer: Medicare Other | Admitting: Family Medicine

## 2013-02-09 VITALS — BP 118/70 | HR 80 | Temp 98.4°F | Ht 68.5 in | Wt 226.2 lb

## 2013-02-09 DIAGNOSIS — E119 Type 2 diabetes mellitus without complications: Secondary | ICD-10-CM

## 2013-02-09 DIAGNOSIS — K7469 Other cirrhosis of liver: Secondary | ICD-10-CM

## 2013-02-09 DIAGNOSIS — K7460 Unspecified cirrhosis of liver: Secondary | ICD-10-CM

## 2013-02-09 DIAGNOSIS — Z0000 Encounter for general adult medical examination without abnormal findings: Secondary | ICD-10-CM

## 2013-02-09 MED ORDER — METFORMIN HCL 500 MG PO TABS: 250.0000 mg | ORAL_TABLET | Freq: Every day | ORAL | Status: DC

## 2013-02-09 NOTE — Progress Notes (Signed)
Subjective:    Patient ID: [name], male    DOB: 10/23/1933, 80 y.o.   MRN: [id number]  HPI CC: medicare wellness visit  Lab Results  Component Value Date   HGBA1C 6.9* 02/02/2013   Lab Results  Component Value Date   CREATININE 1.2 02/02/2013   DM - sugars have been increasing recently.  Fasting sugars seen to be closer to 140-150 daily.  Has not been compliant with diet.  Vision screen and hearing screen passed. No falls in last year Denies depression/anhedonia, sadness.  Preventative: Colonoscopy 2008 - good for 10 yrs per records. Prostate screening - s/p prostatectomy for bladder cancer. Flu shot 2014 Pneumovax 2006 Td 2004 zostavax - declines Advanced directives: working on living will at home.  HCPOA would be oldest daughter.  Lives with wife.   7 children Daily caffeine use  Medications and allergies reviewed and updated in chart.  Past histories reviewed and updated if relevant as below. Patient Active Problem List   Diagnosis Date Noted  . Leg edema 07/05/2012  . Radicular leg pain 12/07/2011  . Cirrhosis, cryptogenic 07/06/2011  . C. difficile colitis 07/06/2011  . Anti-Duffy a antibodies present 07/06/2011  . Duodenal nodule 07/06/2011  . Gastric varices 06/30/2011  . GI bleed 06/28/2011  . Gastric and duodenal angiodysplasia 06/28/2011  . Hematuria 06/26/2011  . AKI (acute kidney injury) 06/26/2011  . Current use of long term anticoagulation 06/26/2011  . Diastolic CHF 54/49/2010  . Dyspnea 03/22/2011  . Neoplasm of uncertain behavior of skin 07/30/2010  . COPD - on 2-3 L O2 at night 07/30/2010  . Atrial fibrillation 06/23/2010  . ANEMIA-B12 DEFICIENCY 11/28/2007  . ANEMIA, IRON DEFICIENCY 04/03/2007  . INGUINAL HERNIA, LEFT 04/03/2007  . HEMORRHOIDS, INTERNAL 10/26/2006  . DIABETES MELLITUS, TYPE II 07/14/2006  . HYPERLIPIDEMIA 07/14/2006  . HYPERTENSION 07/14/2006  . AORTIC INSUFFICIENCY 07/14/2006   Past Medical History  Diagnosis Date    . Diabetes mellitus, type 2   . Hyperlipidemia   . Hypertension   . Cryptogenic cirrhosis     Followed by Dr Fuller Plan, never biopsied, has been stable.  History of gastric varices.  . Pyloric stenosis     mild EGD 10/26/06  . Inguinal hernia     left  . Iron deficiency anemia   . Hemorrhoids   . B12 deficiency   . COPD (chronic obstructive pulmonary disease)     (Dr. Raul Del)  . History of ileostomy   . History of pneumonia 2012  . Shortness of breath   . Chronic a-fib     on pradaxa, declined cardioversion in past  . History of bladder cancer 1984    s/p ileal conduit  . GI bleed   . CHF (congestive heart failure)     diastolic. Echo 5/10 with mild LVH, EF 07%, grade 1 diastolic dysfunction, severe left atrial enlargement, aortic sclerosis  . Gastric varices   . Arthritis   . Pinched nerve     back   Past Surgical History  Procedure Laterality Date  . Ileostomy  1984    bladder cancer  . Prostatectomy  1984    bladder cancer  . Penile prosthesis implant  1990    inflatable implant  . US echocardiography  05/1995    EF 60% TR, AI  . Colonoscopy  07/24/2003    Int. hemorrhoids  . Be  08/08/2003  . Colonoscopy  10/26/2006    Int hemorrhoids, 10 yrs  . Esophagogastroduodenoscopy  10/26/2006  Gastric varices, pyloric stenosis  . Bladder removal      s/p urostomy bag  . Esophagogastroduodenoscopy  07/01/2011    Procedure: ESOPHAGOGASTRODUODENOSCOPY (EGD);  Surgeon: Jerene Bears, MD;  Location: [place];  Service: Gastroenterology;  Laterality: N/A;  . Esophagogastroduodenoscopy  06/28/2011    Procedure: ESOPHAGOGASTRODUODENOSCOPY (EGD);  Surgeon: Jerene Bears, MD;  Location: [place];  Service: Gastroenterology;  Laterality: N/A;  . Fetal blood transfusion     History  Substance Use Topics  . Smoking status: Former Smoker -- 1.00 packs/day for 45 years    Types: Cigarettes    Quit date: 06/22/1989  . Smokeless tobacco: Never Used     Comment: Quit 1990  .  Alcohol Use: No   Family History  Problem Relation Age of Onset  . Hypertension Mother   . Osteoarthritis Mother   . Heart disease Father     MI  . Coronary artery disease Sister   . Diabetes Brother   . Heart disease Brother     MI  . Coronary artery disease Sister   . Colon cancer Neg Hx   . Stroke Other     GPs   No Known Allergies Current Outpatient Prescriptions on File Prior to Visit  Medication Sig Dispense Refill  . acetaminophen (TYLENOL) 325 MG tablet Take 325 mg by mouth every 6 (six) hours as needed.      . cloNIDine (CATAPRES) 0.2 MG tablet Take 1 tablet (0.2 mg total) by mouth 2 (two) times daily.  60 tablet  6  . Cyanocobalamin (B-12) 2500 MCG TABS Take 1 tablet by mouth daily.      . ferrous sulfate (FERRO-BOB) 325 (65 FE) MG tablet Take 1 tablet (325 mg total) by mouth daily with breakfast.      . furosemide (LASIX) 20 MG tablet Take 1 tablet (20 mg total) by mouth 2 (two) times daily.  60 tablet  11  . losartan (COZAAR) 100 MG tablet take 1 tablet by mouth once daily  90 tablet  3  . magnesium oxide (MAG-OX) 400 (241.3 MG) MG tablet take 1 tablet by mouth once daily  90 tablet  3  . nadolol (CORGARD) 40 MG tablet Take 1 tablet (40 mg total) by mouth daily.  30 tablet  11  . ONE TOUCH LANCETS MISC One daily and as needed/ ICD code 250.00       . ONE TOUCH ULTRA TEST test strip TEST as directed  100 each  11  . pantoprazole (PROTONIX) 40 MG tablet take 1 tablet by mouth once daily  30 tablet  11  . PRADAXA 150 MG CAPS take 1 capsule by mouth every 12 hours  60 capsule  6  . Respiratory Therapy Supplies (NEBULIZER) DEVI by Does not apply route. Use as directed       . simvastatin (ZOCOR) 20 MG tablet take 1 tablet by mouth once daily  30 tablet  6  . albuterol (PROVENTIL HFA;VENTOLIN HFA) 108 (90 BASE) MCG/ACT inhaler Inhale 2 puffs into the lungs every 6 (six) hours as needed for wheezing.       No current facility-administered medications on file prior to visit.       Review of Systems  Constitutional: Positive for chills. Negative for fever, activity change, appetite change, fatigue and unexpected weight change.       3/3 recall Trouble with serial 7s, only 3/5 serial 3s, [id number]  HENT: Negative for hearing loss.   Eyes: Negative for visual  disturbance.  Respiratory: Positive for cough (mild). Negative for chest tightness, shortness of breath and wheezing.   Cardiovascular: Negative for chest pain, palpitations and leg swelling.  Gastrointestinal: Negative for nausea, vomiting, abdominal pain, diarrhea, constipation, blood in stool and abdominal distention.  Genitourinary: Negative for hematuria and difficulty urinating.  Musculoskeletal: Negative for arthralgias, myalgias and neck pain.  Skin: Negative for rash.  Neurological: Negative for dizziness, seizures, syncope and headaches.  Hematological: Negative for adenopathy. Does not bruise/bleed easily.  Psychiatric/Behavioral: Negative for dysphoric mood. The patient is not nervous/anxious.        Objective:   Physical Exam  Nursing note and vitals reviewed. Constitutional: He is oriented to person, place, and time. He appears well-developed and well-nourished. No distress.  HENT:  Head: Normocephalic and atraumatic.  Right Ear: Hearing, tympanic membrane, external ear and ear canal normal.  Left Ear: Hearing, tympanic membrane, external ear and ear canal normal.  Nose: Nose normal.  Mouth/Throat: Oropharynx is clear and moist. No oropharyngeal exudate.  Eyes: Conjunctivae and EOM are normal. Pupils are equal, round, and reactive to light. No scleral icterus.  Neck: Normal range of motion. Neck supple. Carotid bruit is not present. No thyromegaly present.  Cardiovascular: Normal rate, regular rhythm, normal heart sounds and intact distal pulses.   No murmur heard. Pulses:      Radial pulses are 2+ on the right side, and 2+ on the left side.  Pulmonary/Chest: Effort normal and breath  sounds normal. No respiratory distress. He has no wheezes. He has no rales.  Abdominal: Soft. Bowel sounds are normal. He exhibits no distension and no mass. There is no tenderness. There is no rebound and no guarding.  Urostomy bag RLQ with dressings c/d/i  Musculoskeletal: Normal range of motion. He exhibits edema (tr).  Lymphadenopathy:    He has no cervical adenopathy.  Neurological: He is alert and oriented to person, place, and time.  CN grossly intact, station and gait intact  Skin: Skin is warm and dry. No rash noted.  Psychiatric: He has a normal mood and affect. His behavior is normal. Judgment and thought content normal.       Assessment & Plan:

## 2013-02-09 NOTE — Progress Notes (Signed)
Pre-visit discussion using our clinic review tool. No additional management support is needed unless otherwise documented below in the visit note.  

## 2013-02-09 NOTE — Patient Instructions (Signed)
Let's restart metformin at 250mg  daily (1/2 tablet daily) as your sugars have been creeping up.  Look into www.diabetes.org for diabetic diet. Good to see you today, return to see me in 4-6 months for diabetes follow up. Call us in interim with questions.

## 2013-02-10 NOTE — Assessment & Plan Note (Signed)
I have personally reviewed the Medicare Annual Wellness questionnaire and have noted 1. The patient's medical and social history 2. Their use of alcohol, tobacco or illicit drugs 3. Their current medications and supplements 4. The patient's functional ability including ADL's, fall risks, home safety risks and hearing or visual impairment. 5. Diet and physical activity 6. Evidence for depression or mood disorders The patients weight, height, BMI have been recorded in the chart.  Hearing and vision has been addressed. I have made referrals, counseling and provided education to the patient based review of the above and I have provided the pt with a written personalized care plan for preventive services. See scanned questionairre. Advanced directives discussed: working on this at home.  Eldest daughter would be HCPOA  Reviewed preventative protocols and updated unless pt declined Aged out of prostate screening.  UTD colonoscopy.

## 2013-02-10 NOTE — Assessment & Plan Note (Signed)
Chronic, stable 

## 2013-02-10 NOTE — Assessment & Plan Note (Signed)
Chronic. Sugars continue to run high as evidenced by log he brings today. A1c has crept up to 6.9%.   Will recommend starting metformin 250mg  daily and return in 3-4 mo for DM f/u.  Pt agrees with plan.

## 2013-02-12 ENCOUNTER — Telehealth: Payer: Self-pay

## 2013-02-12 NOTE — Telephone Encounter (Signed)
Relevant patient education assigned to patient using Emmi. ° °

## 2013-02-26 ENCOUNTER — Other Ambulatory Visit: Payer: Self-pay | Admitting: Family Medicine

## 2013-04-15 ENCOUNTER — Other Ambulatory Visit: Payer: Self-pay | Admitting: Cardiovascular Disease

## 2013-04-15 ENCOUNTER — Other Ambulatory Visit: Payer: Self-pay | Admitting: Family Medicine

## 2013-04-16 ENCOUNTER — Other Ambulatory Visit: Payer: Self-pay

## 2013-04-16 MED ORDER — MAGNESIUM OXIDE 400 (241.3 MG) MG PO TABS: ORAL_TABLET | ORAL | Status: DC

## 2013-04-16 MED ORDER — DOXAZOSIN MESYLATE 8 MG PO TABS: 4.0000 mg | ORAL_TABLET | Freq: Two times a day (BID) | ORAL | Status: DC

## 2013-04-16 MED ORDER — CLONIDINE HCL 0.2 MG PO TABS: ORAL_TABLET | ORAL | Status: DC

## 2013-04-16 NOTE — Telephone Encounter (Signed)
Refill sent for clonidine, magnesium and doxazosin.

## 2013-04-26 ENCOUNTER — Other Ambulatory Visit: Payer: Self-pay | Admitting: Family Medicine

## 2013-05-23 ENCOUNTER — Other Ambulatory Visit: Payer: Self-pay | Admitting: Cardiovascular Disease

## 2013-05-23 ENCOUNTER — Other Ambulatory Visit: Payer: Self-pay | Admitting: Family Medicine

## 2013-08-02 ENCOUNTER — Telehealth: Payer: Self-pay

## 2013-08-02 ENCOUNTER — Encounter: Payer: Self-pay | Admitting: Family Medicine

## 2013-08-02 DIAGNOSIS — Z936 Other artificial openings of urinary tract status: Secondary | ICD-10-CM | POA: Insufficient documentation

## 2013-08-02 NOTE — Telephone Encounter (Signed)
Written out script and placed in [facility] box. plz check on quantity needed.

## 2013-08-02 NOTE — Telephone Encounter (Signed)
Pt left note requesting order for wafer [id number]  2 1/4" and pouch [id number]  2 1/4". Pt request order sent to [facility] [place] [id number]  Fax # [phone]. Pt needs ASAP.

## 2013-08-03 NOTE — Telephone Encounter (Signed)
Spoke with patient. Insurance will only cover 2 boxes at a time. Filled out script to reflect with 6 refills. Faxed to [facility].

## 2013-08-08 ENCOUNTER — Ambulatory Visit (INDEPENDENT_AMBULATORY_CARE_PROVIDER_SITE_OTHER): Payer: Medicare Other | Admitting: Family Medicine

## 2013-08-08 ENCOUNTER — Encounter: Payer: Self-pay | Admitting: Family Medicine

## 2013-08-08 ENCOUNTER — Telehealth: Payer: Self-pay | Admitting: Family Medicine

## 2013-08-08 VITALS — BP 140/70 | HR 72 | Temp 98.1°F | Wt 224.5 lb

## 2013-08-08 DIAGNOSIS — E785 Hyperlipidemia, unspecified: Secondary | ICD-10-CM

## 2013-08-08 DIAGNOSIS — I509 Heart failure, unspecified: Secondary | ICD-10-CM

## 2013-08-08 DIAGNOSIS — I10 Essential (primary) hypertension: Secondary | ICD-10-CM

## 2013-08-08 DIAGNOSIS — K7460 Unspecified cirrhosis of liver: Secondary | ICD-10-CM

## 2013-08-08 DIAGNOSIS — I5032 Chronic diastolic (congestive) heart failure: Secondary | ICD-10-CM

## 2013-08-08 DIAGNOSIS — E119 Type 2 diabetes mellitus without complications: Secondary | ICD-10-CM

## 2013-08-08 DIAGNOSIS — J449 Chronic obstructive pulmonary disease, unspecified: Secondary | ICD-10-CM

## 2013-08-08 DIAGNOSIS — K7469 Other cirrhosis of liver: Secondary | ICD-10-CM

## 2013-08-08 LAB — HEMOGLOBIN A1C: HEMOGLOBIN A1C: 6.3 % (ref 4.6–6.5)

## 2013-08-08 LAB — COMPREHENSIVE METABOLIC PANEL
ALT: 38 U/L (ref 0–53)
AST: 52 U/L — ABNORMAL HIGH (ref 0–37)
Albumin: 4.1 g/dL (ref 3.5–5.2)
Alkaline Phosphatase: 98 U/L (ref 39–117)
BUN: 14 mg/dL (ref 6–23)
CALCIUM: 9.3 mg/dL (ref 8.4–10.5)
CHLORIDE: 104 meq/L (ref 96–112)
CO2: 30 meq/L (ref 19–32)
Creatinine, Ser: 1.2 mg/dL (ref 0.4–1.5)
GFR: 64.06 mL/min (ref >60.00)
Glucose, Bld: 174 mg/dL — ABNORMAL HIGH (ref 70–99)
Potassium: 4.1 mEq/L (ref 3.5–5.1)
SODIUM: 140 meq/L (ref 135–145)
TOTAL PROTEIN: 7.2 g/dL (ref 6.0–8.3)
Total Bilirubin: 1 mg/dL (ref 0.2–1.2)

## 2013-08-08 NOTE — Progress Notes (Signed)
BP 140/70  Pulse 72  Temp(Src) 98.1 F (36.7 C) (Oral)  Wt 224 lb 8 oz (101.833 kg)   CC: 6 mo f/u  Subjective:    Patient ID: [name], male    DOB: 10-Mar-1933, 80 y.o.   MRN: [id number]  HPI: [name] is a 80 y.o. male presenting on 08/08/2013 for Follow-up   Sleeps with supplemental O2 2L [place] at night time, doesn't use during the day. Weight steady at 220s.  Wt Readings from Last 3 Encounters:  08/08/13 224 lb 8 oz (101.833 kg)  02/09/13 226 lb 4 oz (102.626 kg)  01/08/13 223 lb 8 oz (101.379 kg)   Body mass index is 33.63 kg/(m^2).   DM - regularly does check sugars 100-120 fasting.  Compliant with antihyperglycemic regimen which includes: metformin 250mg  once daily.  Denies low sugars or hypoglycemic symptoms.  Denies paresthesias. Last diabetic eye exam DUE.  Pneumovax: 2006.  Prevnar: will obtain at CPE.   HTN - Compliant with current antihypertensive regimen of nadolol, clonidine, losartan, lasix.  Does check blood pressures at home: 150-160/70s. No low blood pressure readings or symptoms of dizziness/syncope.  Denies HA, vision changes, CP/tightness, SOB, leg swelling.    HLD - simvastatin 20mg  nightly.  Cryptogenic cirrhosis - followed by Dr. Fuller Plan.  Relevant past medical, surgical, family and social history reviewed and updated as indicated.  Allergies and medications reviewed and updated. Current Outpatient Prescriptions on File Prior to Visit  Medication Sig  . acetaminophen (TYLENOL) 325 MG tablet Take 325 mg by mouth every 6 (six) hours as needed.  Marland Kitchen albuterol (PROVENTIL HFA;VENTOLIN HFA) 108 (90 BASE) MCG/ACT inhaler Inhale 2 puffs into the lungs every 6 (six) hours as needed for wheezing.  . cloNIDine (CATAPRES) 0.2 MG tablet take 1 tablet by mouth twice a day  . Cyanocobalamin (B-12) 2500 MCG TABS Take 1 tablet by mouth daily.  Marland Kitchen doxazosin (CARDURA) 8 MG tablet Take 0.5 tablets (4 mg total) by mouth 2 (two) times daily.  . ferrous sulfate (FERRO-BOB)  325 (65 FE) MG tablet Take 1 tablet (325 mg total) by mouth daily with breakfast.  . furosemide (LASIX) 20 MG tablet take 1 tablet by mouth twice a day  . losartan (COZAAR) 100 MG tablet take 1 tablet place once daily  . magnesium oxide (MAG-OX) 400 (241.3 MG) MG tablet take 1 tablet by mouth once daily  . metFORMIN (GLUCOPHAGE) 500 MG tablet Take 0.5 tablets (250 mg total) by mouth daily with breakfast.  . nadolol (CORGARD) 40 MG tablet take 1 tablet by mouth once daily  . ONE TOUCH LANCETS MISC One daily and as needed/ ICD code 250.00   . ONE TOUCH ULTRA TEST test strip TEST as directed  . pantoprazole (PROTONIX) 40 MG tablet take 1 tablet by mouth once daily  . PRADAXA 150 MG CAPS capsule take 1 capsule by mouth every 12 hours  . Respiratory Therapy Supplies (NEBULIZER) DEVI by Does not apply route. Use as directed   . simvastatin (ZOCOR) 20 MG tablet take 1 tablet by mouth once daily   No current facility-administered medications on file prior to visit.    Review of Systems Per HPI unless specifically indicated above    Objective:    BP 140/70  Pulse 72  Temp(Src) 98.1 F (36.7 C) (Oral)  Wt 224 lb 8 oz (101.833 kg)  Physical Exam  Nursing note and vitals reviewed. Constitutional: He appears well-developed and well-nourished. No distress.  HENT:  Mouth/Throat: Oropharynx is clear and moist. No oropharyngeal exudate.  Cardiovascular: Normal rate, regular rhythm, normal heart sounds and intact distal pulses.   No murmur heard. Pulmonary/Chest: Effort normal and breath sounds normal. No respiratory distress. He has no wheezes. He has no rales.  Musculoskeletal: He exhibits no edema.  Diabetic foot exam: Normal inspection No skin breakdown No calluses  Normal DP pulses Normal sensation to light touch and monofilament Nails normal  Skin: Skin is warm and dry. No rash noted.       Assessment & Plan:   Problem List Items Addressed This Visit   HYPERTENSION     Chronic,  stable. Continue bp regimen. Elevated readings at home, but these are all am readings prior to bp meds. Suggested he occasionally check bp other times in day to monitor for control.    HYPERLIPIDEMIA     Chronic, stable. Continue simvastatin.    Diastolic CHF     Euvolemic. Weight remains stable in mid 220s.    COPD - on 2-3 L O2 at night     Chronic, stable. Denies respiratory sxs. Off COPD meds.    Controlled diabetes mellitus type 2 with complications - Primary     Chronic, stable. Sugar control improved based on log of cbg's he brings. Check A1c today. Continue metformin 250mg  once daily.     Cirrhosis, cryptogenic     Chronic, stable. Followed closely by GI.        Follow up plan: Return in about 6 months (around 02/08/2014), or as needed, for medicare wellness visit.

## 2013-08-08 NOTE — Assessment & Plan Note (Signed)
Chronic, stable. Continue simvastatin.  

## 2013-08-08 NOTE — Assessment & Plan Note (Signed)
Chronic, stable. Followed closely by GI.

## 2013-08-08 NOTE — Assessment & Plan Note (Signed)
Chronic, stable. Continue bp regimen. Elevated readings at home, but these are all am readings prior to bp meds. Suggested he occasionally check bp other times in day to monitor for control.

## 2013-08-08 NOTE — Progress Notes (Signed)
Pre visit review using our clinic review tool, if applicable. No additional management support is needed unless otherwise documented below in the visit note. 

## 2013-08-08 NOTE — Telephone Encounter (Signed)
Relevant patient education assigned to patient using Emmi. ° °

## 2013-08-08 NOTE — Assessment & Plan Note (Signed)
Chronic, stable. Sugar control improved based on log of cbg's he brings. Check A1c today. Continue metformin 250mg  once daily.

## 2013-08-08 NOTE — Assessment & Plan Note (Signed)
Euvolemic. Weight remains stable in mid 220s.

## 2013-08-08 NOTE — Assessment & Plan Note (Signed)
Chronic, stable. Denies respiratory sxs. Off COPD meds.

## 2013-08-08 NOTE — Patient Instructions (Signed)
Schedule eye exam as you're due. Blood work today. Good to see you today, call us with questions. Return as needed or in 6 months for medicare wellness visit.

## 2013-08-20 ENCOUNTER — Other Ambulatory Visit: Payer: Self-pay | Admitting: Family Medicine

## 2013-08-27 ENCOUNTER — Ambulatory Visit (INDEPENDENT_AMBULATORY_CARE_PROVIDER_SITE_OTHER): Payer: Medicare Other | Admitting: Cardiovascular Disease

## 2013-08-27 ENCOUNTER — Encounter: Payer: Self-pay | Admitting: Cardiovascular Disease

## 2013-08-27 VITALS — BP 120/60 | HR 54 | Ht 68.0 in | Wt 225.2 lb

## 2013-08-27 DIAGNOSIS — R600 Localized edema: Secondary | ICD-10-CM

## 2013-08-27 DIAGNOSIS — I509 Heart failure, unspecified: Secondary | ICD-10-CM

## 2013-08-27 DIAGNOSIS — I480 Paroxysmal atrial fibrillation: Secondary | ICD-10-CM

## 2013-08-27 DIAGNOSIS — I10 Essential (primary) hypertension: Secondary | ICD-10-CM

## 2013-08-27 DIAGNOSIS — E785 Hyperlipidemia, unspecified: Secondary | ICD-10-CM

## 2013-08-27 DIAGNOSIS — I5032 Chronic diastolic (congestive) heart failure: Secondary | ICD-10-CM

## 2013-08-27 DIAGNOSIS — R609 Edema, unspecified: Secondary | ICD-10-CM

## 2013-08-27 DIAGNOSIS — K31819 Angiodysplasia of stomach and duodenum without bleeding: Secondary | ICD-10-CM

## 2013-08-27 DIAGNOSIS — I4891 Unspecified atrial fibrillation: Secondary | ICD-10-CM

## 2013-08-27 DIAGNOSIS — R0789 Other chest pain: Secondary | ICD-10-CM

## 2013-08-27 NOTE — Assessment & Plan Note (Signed)
Leg edema has resolved by holding amlodipine in with Lasix.

## 2013-08-27 NOTE — Patient Instructions (Addendum)
You are doing well. Please hold the pradaxa, take for atrial fibrillation Closely monitor your heart rate Call us for heart arrhythmia  Please call us if you have new issues that need to be addressed before your next appt.  Your physician wants you to follow-up in: 6 months.  You will receive a reminder letter in the mail two months in advance. If you don't receive a letter, please call our office to schedule the follow-up appointment.

## 2013-08-27 NOTE — Assessment & Plan Note (Signed)
Cholesterol is at goal on the current lipid regimen. No changes to the medications were made.  

## 2013-08-27 NOTE — Assessment & Plan Note (Signed)
Blood pressure is well controlled on today's visit. No changes made to the medications. 

## 2013-08-27 NOTE — Assessment & Plan Note (Signed)
Given his prior GI bleed history, no recurrent atrial fibrillation in over one year, we'll hold his anticoagulation

## 2013-08-27 NOTE — Progress Notes (Signed)
Patient ID: [name], male    DOB: February 18, 1933, 80 y.o.   MRN: [id number]  HPI Comments: Mr. [name] is a very pleasant 80 yo gentleman with a long history of smoking, suspected COPD, no known coronary artery disease,  last  echo in 2010 , who was  brought in by EMS to the hospital in mid May 2012 with respiratory distress, started on BiPAP with chest x-ray showing right lower lobe pneumonia, new atrial fibrillation. He was offered cardioversion though he declined and preferred medical management.  admission to [facility] in early 2013 for right upper lobe pneumonia.  Admission 09/12/2011 for COPD exacerbation. He presents for routine followup today  In general he reports that he is doing well. Hemoglobin A1c down to 6.3, total cholesterol 150, LDL 65 He denies having any tachycardia or palpitations concerning for atrial fibrillation. His last documented nature fibrillation episode was in December 2013. He denies having any further episodes since that time He would like to stop his anticoagulation as it is very expensive and he is in the donut hole. He is also concerned about recurrent GI bleed. He denies having any significant lower extremity swelling. This has improved without amlodipine  Previous hospital admission June 27 2011  with GI bleed.  Upper endoscopy Showed angiodysplasias with ablation, gastric varices. He was started on proton pump inhibitor. He was also treated for C. difficile . Hematocrit reached  low 20s on admission.  his anticoagulation was held at discharge . His dose of Coreg was also significantly decreased at discharge from 25 mg to 6.25 mg twice a day.  Echocardiogram May 2013 shows normal LV systolic function, greater than 55%, mildly dilated left atrium, no diastolic dysfunction  EKG today shows normal sinus rhythm with significant first degree AV block, rate 54  beats per minute, nonspecific T wave abnormality in the anterolateral and inferior leads    Outpatient Encounter  Prescriptions as of 08/27/2013  Medication Sig  . acetaminophen (TYLENOL) 325 MG tablet Take 325 mg by mouth every 6 (six) hours as needed.  Marland Kitchen albuterol (PROVENTIL HFA;VENTOLIN HFA) 108 (90 BASE) MCG/ACT inhaler Inhale 2 puffs into the lungs every 6 (six) hours as needed for wheezing.  . cloNIDine (CATAPRES) 0.2 MG tablet take 1 tablet by mouth twice a day  . Cyanocobalamin (B-12) 2500 MCG TABS Take 1 tablet by mouth daily.  Marland Kitchen doxazosin (CARDURA) 8 MG tablet Take 0.5 tablets (4 mg total) by mouth 2 (two) times daily.  . ferrous sulfate (FERRO-BOB) 325 (65 FE) MG tablet Take 1 tablet (325 mg total) by mouth daily with breakfast.  . furosemide (LASIX) 20 MG tablet take 1 tablet by mouth twice a day  . losartan (COZAAR) 100 MG tablet take 1 tablet place once daily  . magnesium oxide (MAG-OX) 400 (241.3 MG) MG tablet take 1 tablet by mouth once daily  . metFORMIN (GLUCOPHAGE) 500 MG tablet Take 0.5 tablets (250 mg total) by mouth daily with breakfast.  . nadolol (CORGARD) 40 MG tablet take 1 tablet by mouth once daily  . ONE TOUCH LANCETS MISC One daily and as needed/ ICD code 250.00   . ONE TOUCH ULTRA TEST test strip TEST as directed  . pantoprazole (PROTONIX) 40 MG tablet take 1 tablet by mouth once daily  . PRADAXA 150 MG CAPS capsule take 1 capsule by mouth every 12 hours  . Respiratory Therapy Supplies (NEBULIZER) DEVI by Does not apply route. Use as directed   . simvastatin (ZOCOR) 20  MG tablet take 1 tablet by mouth once daily     Review of Systems  Constitutional: Negative.   HENT: Negative.   Eyes: Negative.   Respiratory: Negative.   Cardiovascular: Negative.   Gastrointestinal: Negative.   Endocrine: Negative.   Musculoskeletal: Positive for gait problem.  Skin: Negative.   Allergic/Immunologic: Negative.   Neurological: Negative.   Hematological: Negative.   Psychiatric/Behavioral: Negative.   All other systems reviewed and are negative. BP 120/60  Pulse 54  Ht 5\' 8"   (1.727 m)  Wt 225 lb 4 oz (102.173 kg)  BMI 34.26 kg/m2  Physical Exam  Nursing note and vitals reviewed. Constitutional: He is oriented to person, place, and time. He appears well-developed and well-nourished.  HENT:  Head: Normocephalic.  Nose: Nose normal.  Mouth/Throat: Oropharynx is clear and moist.  Eyes: Conjunctivae are normal. Pupils are equal, round, and reactive to light.  Neck: Normal range of motion. Neck supple. No JVD present.  Cardiovascular: Normal rate, S1 normal, S2 normal, normal heart sounds and intact distal pulses.  An irregularly irregular rhythm present. Exam reveals no gallop and no friction rub.   No murmur heard. Pulmonary/Chest: Effort normal and breath sounds normal. No respiratory distress. He has no wheezes. He has no rales. He exhibits no tenderness.  Abdominal: Soft. Bowel sounds are normal. He exhibits no distension. There is no tenderness.  Musculoskeletal: Normal range of motion. He exhibits no edema and no tenderness.  Lymphadenopathy:    He has no cervical adenopathy.  Neurological: He is alert and oriented to person, place, and time. Coordination normal.  Skin: Skin is warm and dry. No rash noted. No erythema.  Psychiatric: He has a normal mood and affect. His behavior is normal. Judgment and thought content normal.      Assessment and Plan

## 2013-08-27 NOTE — Assessment & Plan Note (Signed)
No recent documentation or symptoms of atrial fibrillation. He is interested in stopping his anticoagulation. We spent most of the visit discussing ways that he can track his heart rhythm. Daughter reports that she also has some medical training and can monitor his pulse rate. If he continues to maintain normal sinus rhythm, anticoagulation could be held. We did provide him with samples of pradaxa for recurrent episodes of arrhythmia.

## 2013-08-27 NOTE — Assessment & Plan Note (Signed)
He appears relatively euvolemic on today's visit. We did not change any of his medications apart from anticoagulation

## 2013-09-23 IMAGING — CR DG CHEST 2V
1 series · 2 of 2 positions shown · non-contrast
Comparison: none

REASON FOR EXAM: diff breathing
COMMENTS:

PROCEDURE:     DXR - DXR CHEST PA (OR AP) AND LATERAL  - June 11, 2011  [DATE]
RESULT:     Comparison: 03/22/2011

[Series 1: pa · 0.17mm/px · 2 of 2 slices shown]
[im 1/2]
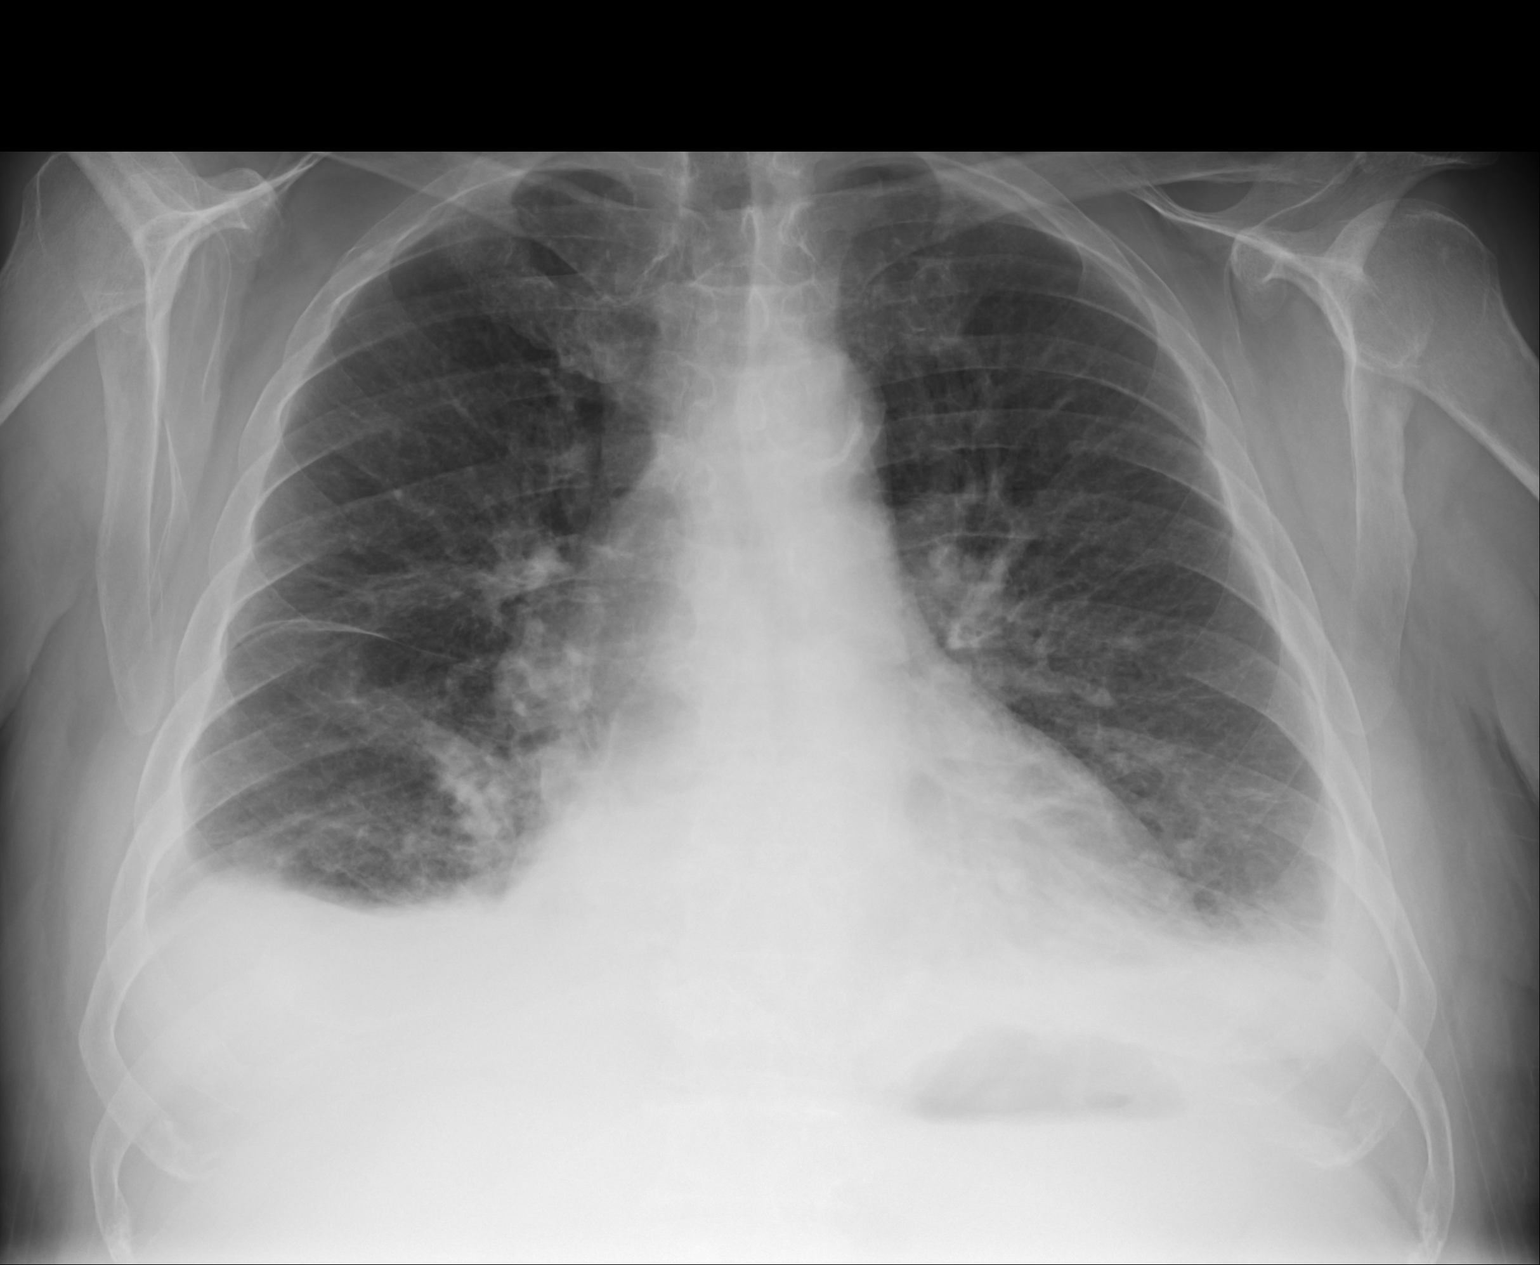
[im 2/2]
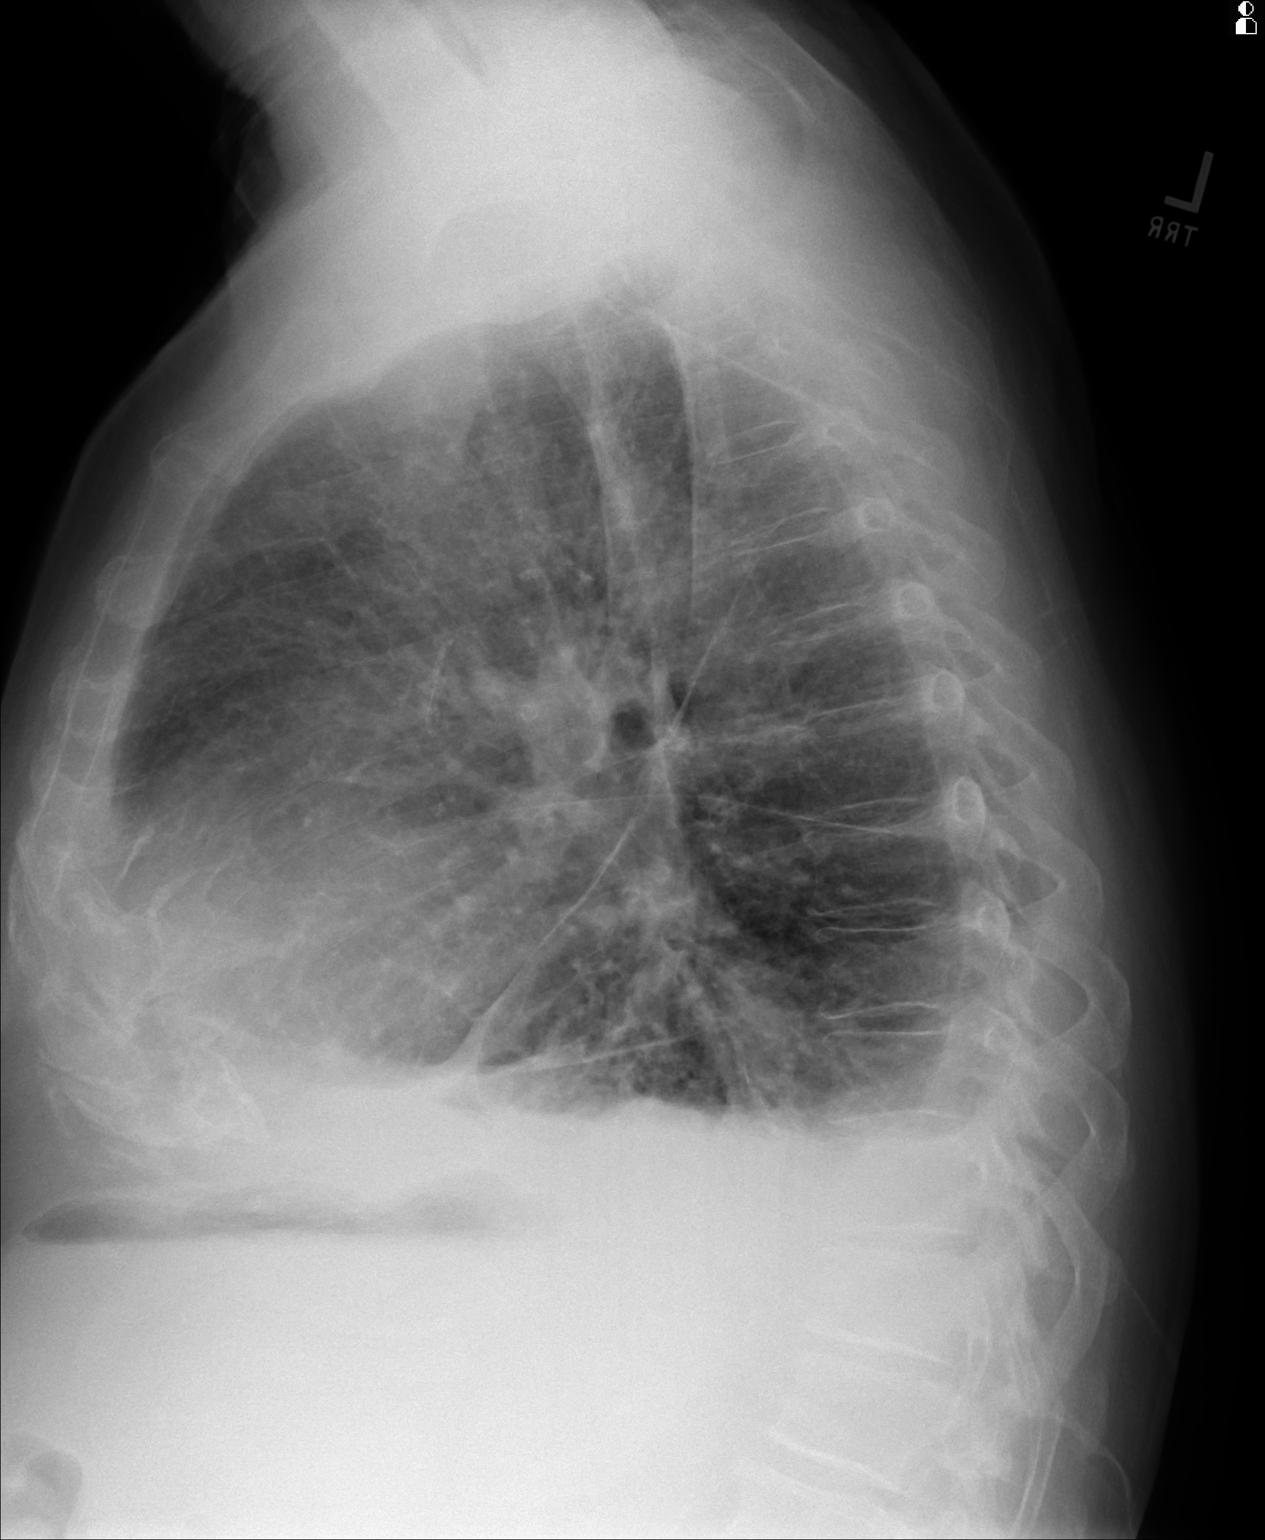

[2 of 2 positions shown; findings below may reference images not displayed]

FINDINGS: Cardiomegaly and the mediastinum are similar to prior. There are small
bilateral pleural effusions. There mild heterogeneous opacities in the mid
and lower lungs.
IMPRESSION: Small bilateral pleural effusions. Mild heterogeneous opacities in the lower
lungs may be secondary to mild pulmonary edema or atelectasis. Infection is
not excluded. Followup PA and lateral chest radiographs are recommended to
ensure resolution.

## 2013-10-12 ENCOUNTER — Other Ambulatory Visit: Payer: Self-pay

## 2013-10-12 MED ORDER — METFORMIN HCL 500 MG PO TABS: 250.0000 mg | ORAL_TABLET | Freq: Every day | ORAL | Status: DC

## 2013-10-12 NOTE — Telephone Encounter (Signed)
Pt called and does not want to split metformin in half and request 250 mg. Advised does not come in 250 mg (confirmed with Judson Roch at [facility]); Medication phoned to [facility] [facility] pharmacy as instructed. Pt notified done.

## 2013-10-17 ENCOUNTER — Telehealth: Payer: Self-pay

## 2013-10-17 NOTE — Telephone Encounter (Signed)
Mendel Ryder with [facility] left v/m requesting cb and current fax # to send over request for ostomy supplies. I spoke with Di Kindle at [facility] [place] and advised fax # [phone].

## 2013-10-21 ENCOUNTER — Encounter: Payer: Self-pay | Admitting: Family Medicine

## 2013-11-19 ENCOUNTER — Other Ambulatory Visit: Payer: Self-pay | Admitting: Cardiovascular Disease

## 2013-12-10 ENCOUNTER — Other Ambulatory Visit: Payer: Self-pay | Admitting: Cardiovascular Disease

## 2013-12-21 ENCOUNTER — Other Ambulatory Visit: Payer: Self-pay | Admitting: Family Medicine

## 2013-12-26 LAB — HM DIABETES EYE EXAM

## 2013-12-28 ENCOUNTER — Other Ambulatory Visit: Payer: Self-pay | Admitting: Family Medicine

## 2014-01-12 ENCOUNTER — Encounter: Payer: Self-pay | Admitting: Family Medicine

## 2014-01-25 ENCOUNTER — Telehealth: Payer: Self-pay | Admitting: Nurse Practitioner

## 2014-01-25 DIAGNOSIS — R059 Cough, unspecified: Secondary | ICD-10-CM

## 2014-01-25 DIAGNOSIS — R05 Cough: Secondary | ICD-10-CM

## 2014-01-25 NOTE — Progress Notes (Signed)
We are sorry that you are not feeling well.  Here is how we plan to help!  Your cough may represent a more serious condition. You will need a face-to-face visit for complicated/severe symptoms which could represent a more serious condition.  If you are having a true medical emergency please call 911.  If you need an urgent face to face visit, [facility] has four urgent care centers for your convenience.  . [facility] a Provider at this Location  [address] [place], [place] [address] . 8 am to 8 pm Monday-Friday . 9 am to 7 pm Saturday-Sunday  . [facility] [place] a Provider at this Location  [address], [place], [place] [address] . 8 am to 8 pm Monday-Friday . 9 am to 6 pm Saturday . 11 am to 6 pm Sunday   . [facility] [place] Get Driving Directions  [address] [place], [place] [address] . 8 am to 8 pm Monday-Friday . 9 am to 4 pm Saturday-Sunday   . [facility] (a walk in primary care provider)  [place] a Provider at this Location  [place], [place] [address] . 8 am to 8:30 pm Monday-Thursday . 8 am to 6 pm Friday . 8 am to 4 pm Saturday-Sunday  Your e-visit answers were reviewed by a board certified advanced clinical practitioner to complete your personal care plan.  Depending on the condition, your plan could have included both over the counter or prescription medications.  You will get an e-mail in the next two days asking about your experience.  I hope that your e-visit has been valuable and will speed your recovery . Thank you for choosing an e-visit.

## 2014-01-28 ENCOUNTER — Ambulatory Visit (INDEPENDENT_AMBULATORY_CARE_PROVIDER_SITE_OTHER)
Admission: RE | Admit: 2014-01-28 | Discharge: 2014-01-28 | Disposition: A | Discharge status: Home / Self Care | Payer: Medicare Other | Source: Ambulatory Visit | Attending: Family Medicine | Admitting: Family Medicine

## 2014-01-28 ENCOUNTER — Ambulatory Visit (INDEPENDENT_AMBULATORY_CARE_PROVIDER_SITE_OTHER): Payer: Medicare Other | Admitting: Family Medicine

## 2014-01-28 ENCOUNTER — Encounter: Payer: Self-pay | Admitting: Family Medicine

## 2014-01-28 VITALS — BP 120/44 | HR 70 | Temp 98.2°F | Wt 218.2 lb

## 2014-01-28 DIAGNOSIS — J181 Lobar pneumonia, unspecified organism: Secondary | ICD-10-CM

## 2014-01-28 DIAGNOSIS — J189 Pneumonia, unspecified organism: Secondary | ICD-10-CM | POA: Insufficient documentation

## 2014-01-28 DIAGNOSIS — R05 Cough: Secondary | ICD-10-CM

## 2014-01-28 DIAGNOSIS — R059 Cough, unspecified: Secondary | ICD-10-CM

## 2014-01-28 DIAGNOSIS — J449 Chronic obstructive pulmonary disease, unspecified: Secondary | ICD-10-CM

## 2014-01-28 HISTORY — DX: Pneumonia, unspecified organism: J18.9

## 2014-01-28 LAB — CBC WITH DIFFERENTIAL/PLATELET
BASOS PCT: 0.4 % (ref 0.0–3.0)
Basophils Absolute: 0 10*3/uL (ref 0.0–0.1)
EOS ABS: 0.2 10*3/uL (ref 0.0–0.7)
Eosinophils Relative: 3.2 % (ref 0.0–5.0)
HCT: 38.2 % — ABNORMAL LOW (ref 39.0–52.0)
HEMOGLOBIN: 12.5 g/dL — AB (ref 13.0–17.0)
Lymphocytes Relative: 15.9 % (ref 12.0–46.0)
Lymphs Abs: 1.1 10*3/uL (ref 0.7–4.0)
MCHC: 32.7 g/dL (ref 30.0–36.0)
MCV: 91.5 fl (ref 78.0–100.0)
MONO ABS: 0.6 10*3/uL (ref 0.1–1.0)
Monocytes Relative: 9.2 % (ref 3.0–12.0)
Neutro Abs: 5 10*3/uL (ref 1.4–7.7)
Neutrophils Relative %: 71.3 % (ref 43.0–77.0)
Platelets: 175 10*3/uL (ref 150.0–400.0)
RBC: 4.17 Mil/uL — ABNORMAL LOW (ref 4.22–5.81)
RDW: 14 % (ref 11.5–15.5)
WBC: 7 10*3/uL (ref 4.0–10.5)

## 2014-01-28 LAB — COMPREHENSIVE METABOLIC PANEL
ALK PHOS: 105 U/L (ref 39–117)
ALT: 32 U/L (ref 0–53)
AST: 42 U/L — ABNORMAL HIGH (ref 0–37)
Albumin: 3.6 g/dL (ref 3.5–5.2)
BUN: 23 mg/dL (ref 6–23)
CO2: 28 mEq/L (ref 19–32)
Calcium: 8.9 mg/dL (ref 8.4–10.5)
Chloride: 104 mEq/L (ref 96–112)
Creatinine, Ser: 1.3 mg/dL (ref 0.4–1.5)
GFR: 54.71 mL/min — ABNORMAL LOW (ref >60.00)
GLUCOSE: 212 mg/dL — AB (ref 70–99)
Potassium: 4.2 mEq/L (ref 3.5–5.1)
Sodium: 141 mEq/L (ref 135–145)
Total Bilirubin: 0.9 mg/dL (ref 0.2–1.2)
Total Protein: 7 g/dL (ref 6.0–8.3)

## 2014-01-28 MED ORDER — LEVOFLOXACIN 500 MG PO TABS: 500.0000 mg | ORAL_TABLET | Freq: Every day | ORAL | Status: DC

## 2014-01-28 NOTE — Progress Notes (Addendum)
Subjective:   Patient ID: [name], male    DOB: 1933-11-26, 81 y.o.   MRN: [id number]  [name] is a pleasant 81 y.o. year old male pt of Dr. Darnell Level, new to me, with complicated medical history, who presents to clinic today with his daughter with Cough  on 01/28/2014  HPI:  Followed by Dr. Raul Del for COPD- wears O2 at night only.  Does not use daily inhaler/inhaled steroid.  Uses albuterol prn only. Has been using this more frequently.  Per pt, saw him last month and per pt, given 10 day course of levaquin for persistent cough.  He did also state that he had an abnormal CXR.  I do not see these notes in Epic.  Per pt, cough improved initially but started worsening last week. Had chills/subjective fever two nights ago.  Cough is productive. Feels weak today. No CP.  Current Outpatient Prescriptions on File Prior to Visit  Medication Sig Dispense Refill  . acetaminophen (TYLENOL) 325 MG tablet Take 325 mg by mouth every 6 (six) hours as needed.    Marland Kitchen albuterol (PROVENTIL HFA;VENTOLIN HFA) 108 (90 BASE) MCG/ACT inhaler Inhale 2 puffs into the lungs every 6 (six) hours as needed for wheezing.    . cloNIDine (CATAPRES) 0.2 MG tablet take 1 tablet by mouth twice a day 60 tablet 6  . Cyanocobalamin (B-12) 2500 MCG TABS Take 1 tablet by mouth daily.    Marland Kitchen doxazosin (CARDURA) 8 MG tablet take 1/2 tablet by mouth twice a day 30 tablet 3  . ferrous sulfate (FERRO-BOB) 325 (65 FE) MG tablet Take 1 tablet (325 mg total) by mouth daily with breakfast.    . furosemide (LASIX) 20 MG tablet take 1 tablet by mouth twice a day 60 tablet 3  . losartan (COZAAR) 100 MG tablet take 1 tablet place once daily 90 tablet 3  . magnesium oxide (MAG-OX) 400 (241.3 MG) MG tablet take 1 tablet by mouth once daily 90 tablet 3  . metFORMIN (GLUCOPHAGE) 500 MG tablet Take 0.5 tablets (250 mg total) by mouth daily with breakfast. 45 tablet 1  . nadolol (CORGARD) 40 MG tablet take 1 tablet by mouth once daily 30 tablet 11    . ONE TOUCH LANCETS MISC One daily and as needed/ ICD code 250.00     . ONE TOUCH ULTRA TEST test strip TEST as directed 100 each 11  . pantoprazole (PROTONIX) 40 MG tablet take 1 tablet by mouth once daily 30 tablet 3  . Respiratory Therapy Supplies (NEBULIZER) DEVI by Does not apply route. Use as directed     . simvastatin (ZOCOR) 20 MG tablet take 1 tablet by mouth once daily 30 tablet 6   No current facility-administered medications on file prior to visit.    No Known Allergies  Past Medical History  Diagnosis Date  . Diabetes mellitus, type 2   . Hyperlipidemia   . Hypertension   . Cryptogenic cirrhosis     Followed by Dr Fuller Plan, never biopsied, has been stable.  History of gastric varices.  . Pyloric stenosis     mild EGD 10/26/06  . Inguinal hernia     left  . Iron deficiency anemia   . Hemorrhoids   . B12 deficiency   . COPD (chronic obstructive pulmonary disease)     (Dr. Raul Del)  . History of ileostomy   . History of pneumonia 2012  . Shortness of breath   . Chronic a-fib  on pradaxa, declined cardioversion in past  . History of bladder cancer 1984    s/p ileal conduit  . GI bleed   . CHF (congestive heart failure)     diastolic. Echo 5/10 with mild LVH, EF 81%, grade 1 diastolic dysfunction, severe left atrial enlargement, aortic sclerosis  . Gastric varices   . Arthritis   . Pinched nerve     back    Past Surgical History  Procedure Laterality Date  . Ileostomy  1984    bladder cancer  . Prostatectomy  1984    bladder cancer  . Penile prosthesis implant  1990    inflatable implant  . US echocardiography  05/1995    EF 60% TR, AI  . Colonoscopy  07/24/2003    Int. hemorrhoids  . Be  08/08/2003  . Colonoscopy  10/26/2006    Int hemorrhoids, 10 yrs  . Esophagogastroduodenoscopy  10/26/2006    Gastric varices, pyloric stenosis  . Bladder removal      s/p urostomy bag  . Esophagogastroduodenoscopy  07/01/2011    Procedure:  ESOPHAGOGASTRODUODENOSCOPY (EGD);  Surgeon: Jerene Bears, MD;  Location: [facility];  Service: Gastroenterology;  Laterality: N/A;  . Esophagogastroduodenoscopy  06/28/2011    Procedure: ESOPHAGOGASTRODUODENOSCOPY (EGD);  Surgeon: Jerene Bears, MD;  Location: [facility];  Service: Gastroenterology;  Laterality: N/A;  . Fetal blood transfusion      Family History  Problem Relation Age of Onset  . Hypertension Mother   . Osteoarthritis Mother   . Heart disease Father     MI  . Coronary artery disease Sister   . Diabetes Brother   . Heart disease Brother     MI  . Coronary artery disease Sister   . Colon cancer Neg Hx   . Stroke Other     GPs    History   Social History  . Marital Status: Married    Spouse Name: N/A    Number of Children: 7  . Years of Education: N/A   Occupational History  . trucking, retired    Social History Main Topics  . Smoking status: Former Smoker -- 1.00 packs/day for 45 years    Types: Cigarettes    Quit date: 06/22/1989  . Smokeless tobacco: Never Used     Comment: Quit 1990  . Alcohol Use: No  . Drug Use: No  . Sexual Activity: Not Currently   Other Topics Concern  . Not on file   Social History Narrative   Lives with wife.     7 children   Daily caffeine use   The PMH, PSH, Social History, Family History, Medications, and allergies have been reviewed in [facility], and have been updated if relevant.    Review of Systems  Constitutional: Positive for fever, chills and fatigue.  HENT: Negative for sinus pressure and sore throat.   Respiratory: Positive for cough and shortness of breath. Negative for chest tightness.   Cardiovascular: Negative.   Gastrointestinal: Negative.   Skin: Negative.   Neurological: Negative.   Psychiatric/Behavioral: Negative.   All other systems reviewed and are negative.      Objective:    BP 120/44 mmHg  Pulse 70  Temp(Src) 98.2 F (36.8 C) (Oral)  Wt 218 lb 4 oz (98.998 kg)  SpO2  88%   Physical Exam  Constitutional: He appears well-developed and well-nourished. No distress.  HENT:  Head: Normocephalic.  Eyes: Pupils are equal, round, and reactive to light.  Neck: Normal range of  motion.  Cardiovascular: Normal rate.   Pulmonary/Chest: Effort normal. No accessory muscle usage. No respiratory distress. He has decreased breath sounds in the right middle field, the right lower field and the left lower field. He has no wheezes. He has no rhonchi. He has no rales.  Skin: Skin is warm and dry.  Psychiatric: He has a normal mood and affect. His speech is normal and behavior is normal. Judgment and thought content normal. Cognition and memory are normal.  Nursing note and vitals reviewed.         Assessment & Plan:   Cough - Plan: CBC with Differential, Comprehensive metabolic panel, DG Chest 2 View  Chronic obstructive pulmonary disease, unspecified COPD, unspecified chronic bronchitis type No Follow-up on file.

## 2014-01-28 NOTE — Patient Instructions (Signed)
Great to meet you. Please wear your oxygen day and night. Take Levaquin as directed- 1 tablet daily for 7 days. I will call you with your xray and lab results.

## 2014-01-28 NOTE — Assessment & Plan Note (Signed)
Now with productive cough and hypoxia. Advised using O2 around the clock, rather than just at night. CXR now- start levaquin 500 mg daily x 7 days. Check labs as well. If any symptoms worsen, he is advised to go straight to the ER. Has follow up scheduled with PCP. The patient indicates understanding of these issues and agrees with the plan.

## 2014-01-28 NOTE — Progress Notes (Signed)
Pre visit review using our clinic review tool, if applicable. No additional management support is needed unless otherwise documented below in the visit note. 

## 2014-02-04 ENCOUNTER — Other Ambulatory Visit: Payer: Self-pay | Admitting: Family Medicine

## 2014-02-04 DIAGNOSIS — E785 Hyperlipidemia, unspecified: Secondary | ICD-10-CM

## 2014-02-04 DIAGNOSIS — E118 Type 2 diabetes mellitus with unspecified complications: Secondary | ICD-10-CM

## 2014-02-05 ENCOUNTER — Other Ambulatory Visit (INDEPENDENT_AMBULATORY_CARE_PROVIDER_SITE_OTHER): Payer: Medicare Other

## 2014-02-05 DIAGNOSIS — E118 Type 2 diabetes mellitus with unspecified complications: Secondary | ICD-10-CM

## 2014-02-05 DIAGNOSIS — E785 Hyperlipidemia, unspecified: Secondary | ICD-10-CM

## 2014-02-05 LAB — LIPID PANEL
CHOL/HDL RATIO: 6
Cholesterol: 103 mg/dL (ref 0–200)
HDL: 18.3 mg/dL — ABNORMAL LOW (ref >39.00)
LDL CALC: 59 mg/dL (ref 0–99)
NonHDL: 84.7
Triglycerides: 128 mg/dL (ref 0.0–149.0)
VLDL: 25.6 mg/dL (ref 0.0–40.0)

## 2014-02-05 LAB — HEMOGLOBIN A1C: HEMOGLOBIN A1C: 6.8 % — AB (ref 4.6–6.5)

## 2014-02-07 ENCOUNTER — Telehealth: Payer: Self-pay | Admitting: Family Medicine

## 2014-02-07 NOTE — Telephone Encounter (Signed)
I did not call pt. 

## 2014-02-07 NOTE — Telephone Encounter (Signed)
Spoke to pt see result note 

## 2014-02-07 NOTE — Telephone Encounter (Signed)
Pt states that he is returning a call from Dr Lambert Keto. Requests a call back.

## 2014-02-12 ENCOUNTER — Encounter: Payer: Self-pay | Admitting: Family Medicine

## 2014-02-12 ENCOUNTER — Ambulatory Visit (INDEPENDENT_AMBULATORY_CARE_PROVIDER_SITE_OTHER): Payer: Medicare Other | Admitting: Family Medicine

## 2014-02-12 VITALS — BP 110/50 | HR 54 | Temp 97.9°F | Ht 68.0 in | Wt 215.5 lb

## 2014-02-12 DIAGNOSIS — E118 Type 2 diabetes mellitus with unspecified complications: Secondary | ICD-10-CM

## 2014-02-12 DIAGNOSIS — K7469 Other cirrhosis of liver: Secondary | ICD-10-CM

## 2014-02-12 DIAGNOSIS — E785 Hyperlipidemia, unspecified: Secondary | ICD-10-CM

## 2014-02-12 DIAGNOSIS — Z7189 Other specified counseling: Secondary | ICD-10-CM

## 2014-02-12 DIAGNOSIS — J181 Lobar pneumonia, unspecified organism: Secondary | ICD-10-CM

## 2014-02-12 DIAGNOSIS — J189 Pneumonia, unspecified organism: Secondary | ICD-10-CM

## 2014-02-12 DIAGNOSIS — J449 Chronic obstructive pulmonary disease, unspecified: Secondary | ICD-10-CM

## 2014-02-12 DIAGNOSIS — Z0000 Encounter for general adult medical examination without abnormal findings: Secondary | ICD-10-CM

## 2014-02-12 DIAGNOSIS — Z23 Encounter for immunization: Secondary | ICD-10-CM

## 2014-02-12 DIAGNOSIS — I10 Essential (primary) hypertension: Secondary | ICD-10-CM

## 2014-02-12 NOTE — Progress Notes (Signed)
Pre visit review using our clinic review tool, if applicable. No additional management support is needed unless otherwise documented below in the visit note. 

## 2014-02-12 NOTE — Addendum Note (Signed)
Addended by: Royann Shivers A on: 02/12/2014 04:24 PM   Modules accepted: Orders

## 2014-02-12 NOTE — Assessment & Plan Note (Signed)
Chronic, stable. Continue regimen. 

## 2014-02-12 NOTE — Assessment & Plan Note (Addendum)
Recent PNA treated with levaquin course - reviewed xray findings with patient and printed out imaging report as well as placed films on CD for pt to take to pulmonologist. Radiology recommends continued monitoring until findings have resolved: IMPRESSION: Focal area of atelectasis and infiltrate at the right base posterior medially. Follow-up is recommended to ensure clearing. Discussed with patient.

## 2014-02-12 NOTE — Assessment & Plan Note (Signed)
Advanced directives: working on living will at home. HCPOA would be oldest daughter.  Will bring [place] a copy when he's done.

## 2014-02-12 NOTE — Assessment & Plan Note (Signed)
Chronic, stable. Continue f/u with GI

## 2014-02-12 NOTE — Assessment & Plan Note (Signed)
Chronic, stable. Tolerating simvastatin. Discussed increased aerobic exercise to improve HDL.

## 2014-02-12 NOTE — Patient Instructions (Addendum)
Bring [place] a copy of your advanced directives when you're done. Let's do flu shot today. Pneumonia shot once you're feeling better. Copy of xray provided today to take with you to Dr Raul Del. Return as needed or in 6 months for follow up.

## 2014-02-12 NOTE — Assessment & Plan Note (Signed)

## 2014-02-12 NOTE — Progress Notes (Signed)
BP 110/50 mmHg  Pulse 54  Temp(Src) 97.9 F (36.6 C) (Oral)  Ht 5\' 8"  (1.727 m)  Wt 215 lb 8 oz (97.75 kg)  BMI 32.77 kg/m2  SpO2 96%   CC: medicare wellness visit  Subjective:    Patient ID: [name], male    DOB: May 09, 1933, 81 y.o.   MRN: [id number]  HPI: [name] is a 81 y.o. male presenting on 02/12/2014 for Annual Exam   Pt with DM, HTN, HLD, cryptogenic cirrhosis.   Since seen by myself, saw Dr Deborra Medina with cough 01/28/2014, treated with levaquin course. Seen by Dr Raul Del last month with similar sxs s/p levaquin course as well. Xray 01/28/2014 showing atx vs consolidation RLL. rec f/u for resolution  Hearing screen passed. Vision screen done at eye doctor recently. No falls in last year Denies depression/anhedonia, sadness. Did have death in family recently - BIL with PNA.  Preventative: Colonoscopy 2008 - good for 10 yrs per records. Prostate screening - s/p prostatectomy for bladder cancer. Flu shot - due Pneumovax 2006, prevnar - due Td 2004 zostavax - declines Advanced directives: working on living will at home. HCPOA would be oldest daughter.  Will bring [place] a copy when he's done.  Daily caffeine use - 1-2 cups/day coffee Lives with wife.   7 children Occ: retired, was Administrator. Activity: no regular exercise Diet: good water, fruits/vegetables daily  Relevant past medical, surgical, family and social history reviewed and updated as indicated. Interim medical history since our last visit reviewed. Allergies and medications reviewed and updated. Current Outpatient Prescriptions on File Prior to Visit  Medication Sig  . acetaminophen (TYLENOL) 325 MG tablet Take 325 mg by mouth every 6 (six) hours as needed.  Marland Kitchen albuterol (PROVENTIL HFA;VENTOLIN HFA) 108 (90 BASE) MCG/ACT inhaler Inhale 2 puffs into the lungs every 6 (six) hours as needed for wheezing.  . cloNIDine (CATAPRES) 0.2 MG tablet take 1 tablet by mouth twice a day  . Cyanocobalamin (B-12) 2500  MCG TABS Take 1 tablet by mouth daily.  Marland Kitchen doxazosin (CARDURA) 8 MG tablet take 1/2 tablet by mouth twice a day  . ferrous sulfate (FERRO-BOB) 325 (65 FE) MG tablet Take 1 tablet (325 mg total) by mouth daily with breakfast.  . furosemide (LASIX) 20 MG tablet take 1 tablet by mouth twice a day  . losartan (COZAAR) 100 MG tablet take 1 tablet place once daily  . magnesium oxide (MAG-OX) 400 (241.3 MG) MG tablet take 1 tablet by mouth once daily  . metFORMIN (GLUCOPHAGE) 500 MG tablet Take 0.5 tablets (250 mg total) by mouth daily with breakfast.  . nadolol (CORGARD) 40 MG tablet take 1 tablet by mouth once daily  . ONE TOUCH LANCETS MISC One daily and as needed/ ICD code 250.00   . ONE TOUCH ULTRA TEST test strip TEST as directed  . pantoprazole (PROTONIX) 40 MG tablet take 1 tablet by mouth once daily  . Respiratory Therapy Supplies (NEBULIZER) DEVI by Does not apply route. Use as directed   . simvastatin (ZOCOR) 20 MG tablet take 1 tablet by mouth once daily   No current facility-administered medications on file prior to visit.    Review of Systems Per HPI unless specifically indicated above     Objective:    BP 110/50 mmHg  Pulse 54  Temp(Src) 97.9 F (36.6 C) (Oral)  Ht 5\' 8"  (1.727 m)  Wt 215 lb 8 oz (97.75 kg)  BMI 32.77 kg/m2  SpO2  96%  Wt Readings from Last 3 Encounters:  02/12/14 215 lb 8 oz (97.75 kg)  01/28/14 218 lb 4 oz (98.998 kg)  08/27/13 225 lb 4 oz (102.173 kg)    Physical Exam  Constitutional: He is oriented to person, place, and time. He appears well-developed and well-nourished. No distress.  HENT:  Head: Normocephalic and atraumatic.  Right Ear: Hearing, tympanic membrane, external ear and ear canal normal.  Left Ear: Hearing, tympanic membrane, external ear and ear canal normal.  Nose: Nose normal.  Mouth/Throat: Uvula is midline, oropharynx is clear and moist and mucous membranes are normal. No oropharyngeal exudate, posterior oropharyngeal edema or  posterior oropharyngeal erythema.  Eyes: Conjunctivae and EOM are normal. Pupils are equal, round, and reactive to light. No scleral icterus.  Neck: Normal range of motion. Neck supple. Carotid bruit is not present. No thyromegaly present.  Cardiovascular: Normal rate, regular rhythm, normal heart sounds and intact distal pulses.   No murmur heard. Pulses:      Radial pulses are 2+ on the right side, and 2+ on the left side.  Pulmonary/Chest: Effort normal. No respiratory distress. He has decreased breath sounds in the right middle field and the right lower field. He has no wheezes. He has rhonchi (R sided). He has no rales.  Abdominal: Soft. Bowel sounds are normal. He exhibits no distension and no mass. There is no tenderness. There is no rebound and no guarding.  Urostomy clean /dry /intact  Musculoskeletal: Normal range of motion. He exhibits no edema.  Lymphadenopathy:    He has no cervical adenopathy.  Neurological: He is alert and oriented to person, place, and time.  CN grossly intact, station and gait intact Recall 3/3  Calculation 4/5 serial 3s 5/5 [id number]  Skin: Skin is warm and dry. No rash noted.  Psychiatric: He has a normal mood and affect. His behavior is normal. Judgment and thought content normal.  Nursing note and vitals reviewed.  Results for orders placed or performed in visit on 02/05/14  Lipid panel  Result Value Ref Range   Cholesterol 103 0 - 200 mg/dL   Triglycerides 128.0 0.0 - 149.0 mg/dL   HDL 18.30 (L) >39.00 mg/dL   VLDL 25.6 0.0 - 40.0 mg/dL   LDL Cholesterol 59 0 - 99 mg/dL   Total CHOL/HDL Ratio 6    NonHDL 84.70   Hemoglobin A1c  Result Value Ref Range   Hgb A1c MFr Bld 6.8 (H) 4.6 - 6.5 %      Assessment & Plan:   Problem List Items Addressed This Visit    RLL pneumonia    Recent PNA treated with levaquin course - reviewed xray findings with patient and printed out imaging report as well as placed films on CD for pt to take to  pulmonologist. Radiology recommends continued monitoring until findings have resolved: IMPRESSION: Focal area of atelectasis and infiltrate at the right base posterior medially. Follow-up is recommended to ensure clearing. Discussed with patient.      Medicare annual wellness visit, subsequent - Primary    I have personally reviewed the Medicare Annual Wellness questionnaire and have noted 1. The patient's medical and social history 2. Their use of alcohol, tobacco or illicit drugs 3. Their current medications and supplements 4. The patient's functional ability including ADL's, fall risks, home safety risks and hearing or visual impairment. 5. Diet and physical activity 6. Evidence for depression or mood disorders The patients weight, height, BMI have been recorded in the chart.  Hearing and vision has been addressed. I have made referrals, counseling and provided education to the patient based review of the above and I have provided the pt with a written personalized care plan for preventive services. Provider list updated - see scanned questionairre. Reviewed preventative protocols and updated unless pt declined.      HLD (hyperlipidemia)    Chronic, stable. Tolerating simvastatin. Discussed increased aerobic exercise to improve HDL.      Essential hypertension    Chronic, stable. Continue regimen.      COPD - on 2-3 L O2 at night   Controlled diabetes mellitus type 2 with complications    Chronic, stable. Continue regimen.      Cirrhosis, cryptogenic    Chronic, stable. Continue f/u with GI.      Advanced care planning/counseling discussion    Advanced directives: working on living will at home. HCPOA would be oldest daughter.  Will bring [place] a copy when he's done.          Follow up plan: Return in about 6 months (around 08/13/2014), or as needed, for follow up visit.

## 2014-02-21 ENCOUNTER — Encounter: Payer: Self-pay | Admitting: Cardiovascular Disease

## 2014-02-21 ENCOUNTER — Ambulatory Visit (INDEPENDENT_AMBULATORY_CARE_PROVIDER_SITE_OTHER): Payer: Medicare Other | Admitting: Cardiovascular Disease

## 2014-02-21 VITALS — BP 138/60 | HR 66 | Ht 68.0 in | Wt 217.8 lb

## 2014-02-21 DIAGNOSIS — J181 Lobar pneumonia, unspecified organism: Secondary | ICD-10-CM

## 2014-02-21 DIAGNOSIS — I10 Essential (primary) hypertension: Secondary | ICD-10-CM

## 2014-02-21 DIAGNOSIS — R0602 Shortness of breath: Secondary | ICD-10-CM

## 2014-02-21 DIAGNOSIS — K31819 Angiodysplasia of stomach and duodenum without bleeding: Secondary | ICD-10-CM

## 2014-02-21 DIAGNOSIS — E785 Hyperlipidemia, unspecified: Secondary | ICD-10-CM

## 2014-02-21 DIAGNOSIS — E118 Type 2 diabetes mellitus with unspecified complications: Secondary | ICD-10-CM

## 2014-02-21 DIAGNOSIS — I5032 Chronic diastolic (congestive) heart failure: Secondary | ICD-10-CM

## 2014-02-21 DIAGNOSIS — I4891 Unspecified atrial fibrillation: Secondary | ICD-10-CM

## 2014-02-21 DIAGNOSIS — J189 Pneumonia, unspecified organism: Secondary | ICD-10-CM

## 2014-02-21 NOTE — Assessment & Plan Note (Signed)
Blood pressure is well controlled on today's visit. No changes made to the medications. 

## 2014-02-21 NOTE — Assessment & Plan Note (Signed)
Cholesterol is at goal on the current lipid regimen. No changes to the medications were made.  

## 2014-02-21 NOTE — Assessment & Plan Note (Signed)
Pneumonia symptoms seem to have resolved though still with residual deep cough, nonproductive

## 2014-02-21 NOTE — Patient Instructions (Addendum)
You are in atrial fibrillation today, possibly the past month  We will help make an appt with Fuller Plan to discuss anticoagulation options You are scheduled to see Dr. Lynne Leader PA 02/27/14 @ 10:15  Please call us if you have new issues that need to be addressed before your next appt.  Your physician wants you to follow-up in: 1 month.

## 2014-02-21 NOTE — Assessment & Plan Note (Signed)
We had a long discussion about anticoagulation today. See above. Suggested follow-up with GI for further discussion

## 2014-02-21 NOTE — Assessment & Plan Note (Signed)
We have encouraged continued exercise, careful diet management in an effort to lose weight. 

## 2014-02-21 NOTE — Assessment & Plan Note (Signed)
Recurrent atrial fibrillation noted on today's visit, likely started in December 2015 in the setting of upper respiratory infection. Daughter noticed irregular pulse at that time. Long discussion today concerning anticoagulation. He is high risk of recurrent GI bleeding. Prior AV malformations, gastric varices, GI bleed in 2013. No recent follow-up with Dr. Fuller Plan of GI. For now we have not restarted anticoagulation. Recommended he call us if he has any TIA type symptoms. We have helped to facilitate a follow-up appointment with GI for further discussion on his risk of recurrent GI bleed on anticoagulation. Options include no anticoagulation, low-dose eliquis 2.5 mill grams twice a day. Would probably shy away from high-dose eliquis 5 mg twice a day. As he is relatively asymptomatic, no plans for TEE and cardioversion at this time as this would require several weeks of anticoagulation, full strength.

## 2014-02-21 NOTE — Assessment & Plan Note (Signed)
Appears relatively euvolemic on today's visit. Recommended he call us if he has any lower extremity edema. He is on Lasix daily

## 2014-02-21 NOTE — Progress Notes (Signed)
Patient ID: [name], male    DOB: Jan 14, 1934, 81 y.o.   MRN: [id number]  HPI Comments: Mr. [name] is a very pleasant 81 yo gentleman with a long history of smoking, suspected COPD, no known coronary artery disease,    mid May 2012 with respiratory distress, right lower lobe pneumonia, new atrial fibrillation,  [facility] for right [facility] lobe pneumonia. GI bleed June 2013, followed by Dr. Fuller Plan for gastric varices, AV malformations on upper GI,  Admission 09/12/2011 for COPD exacerbation. He presents for routine followup today of his atrial fibrillation  Daughter presents with him today. She reports he had an irregular pulse at home in late December 2015. Treated for upper respiratory infection in December 2015, with pneumonia January 2016. He declined to see medicine when symptoms first started. Treated with antibiotics On today's visit, continues to have a cough, though feeling much better. Asymptomatic from atrial fibrillation EKG today showing atrial fibrillation with good rate control in the 60s He denies any lower extremity edema, no PND or orthopnea.  EKG on today's visit showing atrial fibrillation with ventricular rate 66 bpm   Hemoglobin A1c down to 6.3, total cholesterol 150, LDL 65  Other past medical history Previous episode of atrial fibrillation in December 2013.  He did not restart his anticoagulation as it was very expensive and he was in the donut hole. He is also concerned about recurrent GI bleed. Previous leg edema on amlodipine  Previous hospital admission June 27 2011  with GI bleed.  Upper endoscopy Showed angiodysplasias with ablation, gastric varices. He was started on proton pump inhibitor. He was also treated for C. difficile . Hematocrit reached  low 20s on admission.  his anticoagulation was held at discharge . His dose of Coreg was also significantly decreased at discharge from 25 mg to 6.25 mg twice a day.  Echocardiogram May 2013 shows  normal LV systolic function, greater than 55%, mildly dilated left atrium, no diastolic dysfunction   No Known Allergies  Outpatient Encounter Prescriptions as of 02/21/2014  Medication Sig  . acetaminophen (TYLENOL) 325 MG tablet Take 325 mg by mouth every 6 (six) hours as needed.  Marland Kitchen albuterol (PROVENTIL HFA;VENTOLIN HFA) 108 (90 BASE) MCG/ACT inhaler Inhale 2 puffs into the lungs every 6 (six) hours as needed for wheezing.  . cloNIDine (CATAPRES) 0.2 MG tablet take 1 tablet by mouth twice a day  . Cyanocobalamin (B-12) 2500 MCG TABS Take 1 tablet by mouth daily.  Marland Kitchen doxazosin (CARDURA) 8 MG tablet take 1/2 tablet by mouth twice a day  . ferrous sulfate (FERRO-BOB) 325 (65 FE) MG tablet Take 1 tablet (325 mg total) by mouth daily with breakfast.  . furosemide (LASIX) 20 MG tablet take 1 tablet by mouth twice a day  . losartan (COZAAR) 100 MG tablet take 1 tablet place once daily  . magnesium oxide (MAG-OX) 400 (241.3 MG) MG tablet take 1 tablet by mouth once daily  . metFORMIN (GLUCOPHAGE) 500 MG tablet Take 0.5 tablets (250 mg total) by mouth daily with breakfast.  . nadolol (CORGARD) 40 MG tablet take 1 tablet by mouth once daily  . ONE TOUCH LANCETS MISC One daily and as needed/ ICD code 250.00   . ONE TOUCH ULTRA TEST test strip TEST as directed  . pantoprazole (PROTONIX) 40 MG tablet take 1 tablet by mouth once daily  . Respiratory Therapy Supplies (NEBULIZER) DEVI by Does not apply route. Use as directed   .  simvastatin (ZOCOR) 20 MG tablet take 1 tablet by mouth once daily    Past Medical History  Diagnosis Date  . Diabetes mellitus, type 2   . Hyperlipidemia   . Hypertension   . Cryptogenic cirrhosis     Followed by Dr Fuller Plan, never biopsied, has been stable.  History of gastric varices.  . Pyloric stenosis     mild EGD 10/26/06  . Inguinal hernia     left  . Iron deficiency anemia   . Hemorrhoids   . B12 deficiency   . COPD (chronic obstructive pulmonary disease)      (Dr. Raul Del)  . History of ileostomy   . History of pneumonia 2012  . Shortness of breath   . Chronic a-fib     on pradaxa, declined cardioversion in past  . History of bladder cancer 1984    s/p ileal conduit  . GI bleed   . CHF (congestive heart failure)     diastolic. Echo 5/10 with mild LVH, EF 35%, grade 1 diastolic dysfunction, severe left atrial enlargement, aortic sclerosis  . Gastric varices   . Arthritis   . Pinched nerve     back    Past Surgical History  Procedure Laterality Date  . Ileostomy  1984    bladder cancer  . Prostatectomy  1984    bladder cancer  . Penile prosthesis implant  1990    inflatable implant  . US echocardiography  05/1995    EF 60% TR, AI  . Colonoscopy  07/24/2003    Int. hemorrhoids  . Be  08/08/2003  . Colonoscopy  10/26/2006    Int hemorrhoids, 10 yrs  . Esophagogastroduodenoscopy  10/26/2006    Gastric varices, pyloric stenosis  . Bladder removal      s/p urostomy bag  . Esophagogastroduodenoscopy  07/01/2011    Procedure: ESOPHAGOGASTRODUODENOSCOPY (EGD);  Surgeon: Jerene Bears, MD;  Location: [place];  Service: Gastroenterology;  Laterality: N/A;  . Esophagogastroduodenoscopy  06/28/2011    Procedure: ESOPHAGOGASTRODUODENOSCOPY (EGD);  Surgeon: Jerene Bears, MD;  Location: [place];  Service: Gastroenterology;  Laterality: N/A;  . Fetal blood transfusion      Social History  reports that he quit smoking about 24 years ago. His smoking use included Cigarettes. He has a 45 pack-year smoking history. He has never used smokeless tobacco. He reports that he does not drink alcohol or use illicit drugs.  Family History family history includes Coronary artery disease in his sister and sister; Diabetes in his brother; Heart disease in his brother and father; Hypertension in his mother; Osteoarthritis in his mother; Stroke in his other. There is no history of Colon cancer.      Review of Systems  Constitutional: Negative.    Respiratory: Positive for cough.   Cardiovascular: Negative.   Gastrointestinal: Negative.   Musculoskeletal: Positive for gait problem.  Neurological: Negative.   Hematological: Negative.   Psychiatric/Behavioral: Negative.   All other systems reviewed and are negative. BP 138/60 mmHg  Pulse 66  Ht 5\' 8"  (1.727 m)  Wt 217 lb 12 oz (98.771 kg)  BMI 33.12 kg/m2  Physical Exam  Constitutional: He is oriented to person, place, and time. He appears well-developed and well-nourished.  HENT:  Head: Normocephalic.  Nose: Nose normal.  Mouth/Throat: Oropharynx is clear and moist.  Eyes: Conjunctivae are normal. Pupils are equal, round, and reactive to light.  Neck: Normal range of motion. Neck supple. No JVD present.  Cardiovascular: Normal rate, S1 normal,  S2 normal, normal heart sounds and intact distal pulses.  An irregularly irregular rhythm present. Exam reveals no gallop and no friction rub.   No murmur heard. Pulmonary/Chest: Effort normal. No respiratory distress. He has wheezes. He has rales. He exhibits no tenderness.  Abdominal: Soft. Bowel sounds are normal. He exhibits no distension. There is no tenderness.  Musculoskeletal: Normal range of motion. He exhibits no edema or tenderness.  Lymphadenopathy:    He has no cervical adenopathy.  Neurological: He is alert and oriented to person, place, and time. Coordination normal.  Skin: Skin is warm and dry. No rash noted. No erythema.  Psychiatric: He has a normal mood and affect. His behavior is normal. Judgment and thought content normal.      Assessment and Plan   Nursing note and vitals reviewed.

## 2014-02-27 ENCOUNTER — Ambulatory Visit (INDEPENDENT_AMBULATORY_CARE_PROVIDER_SITE_OTHER): Payer: Medicare Other | Admitting: Physician Assistant

## 2014-02-27 ENCOUNTER — Encounter: Payer: Self-pay | Admitting: Physician Assistant

## 2014-02-27 ENCOUNTER — Other Ambulatory Visit: Payer: Medicare Other

## 2014-02-27 VITALS — BP 138/58 | HR 60 | Ht 68.25 in | Wt 220.0 lb

## 2014-02-27 DIAGNOSIS — I864 Gastric varices: Secondary | ICD-10-CM

## 2014-02-27 DIAGNOSIS — K769 Liver disease, unspecified: Secondary | ICD-10-CM

## 2014-02-27 DIAGNOSIS — K7469 Other cirrhosis of liver: Secondary | ICD-10-CM

## 2014-02-27 DIAGNOSIS — K7689 Other specified diseases of liver: Secondary | ICD-10-CM

## 2014-02-27 NOTE — Patient Instructions (Addendum)
Your physician has requested that you go to the basement for the following lab work before leaving today: AFP  You have been scheduled for an MRI at [facility] entrance on Monday 03/11/14. Your appointment time is 10:45 am. Please arrive at 10:15 am for registration purposes. Please make certain not to have anything to eat or drink 6 hours prior to your test. In addition, if you have any metal in your body, have a pacemaker or defibrillator, please be sure to let your ordering physician know. This test typically takes 45 minutes to 1 hour to complete.  Please follow up with Dr Fuller Plan in 1 year.  CC:Dr [name]

## 2014-02-27 NOTE — Progress Notes (Signed)
Reviewed and agree with management plan. MRI to further evaluate hepatic lesion.  Continue Corgard to reduce portal pressures, reduce the risk of variceal bleed. This patient is at a much higher risk of GI bleeding with anticoagulation given his history of gastric varices and AVMs. Final decision on anticoagulation by Cardiology and patient.   Pricilla Riffle. Fuller Plan, MD [facility]

## 2014-02-27 NOTE — Progress Notes (Signed)
Patient ID: [name], male   DOB: 08/09/1933, 81 y.o.   MRN: [id number]     History of Present Illness:    Mr. sites is a delightful 81 year old male who was last seen here in February 2014. He is known to Dr. Fuller Plan with a history of cryptogenic cirrhosis, gastric varices, and several gastric AVMs that were treated when in the hospital in late 2013. The patient had an MRI at [facility] in November 2013 that revealed a 7 mm enhancing lesion in the right hepatic lobe which may represent a flash filling hemangioma versus adenoma but malignancy could not be excluded. The liver demonstrated a micronodular contour typical for cirrhosis. A 4.5 cm right renal cyst was also noted. The patient was recently evaluated by his cardiologist, Dr. Conni Slipper, for atrial fibrillation which started in December 2015 in the setting of an upper respiratory infection. Dr.Gollin Recommended that the patient see GI again to discuss his options for anticoagulation and his risk of recurrent GI bleeding. The patient has felt fine and has had no bright red blood per rectum or melena and his hemoglobin has been stable   Past Medical History  Diagnosis Date  . Diabetes mellitus, type 2   . Hyperlipidemia   . Hypertension   . Cryptogenic cirrhosis     Followed by Dr Fuller Plan, never biopsied, has been stable.  History of gastric varices.  . Pyloric stenosis     mild EGD 10/26/06  . Inguinal hernia     left  . Iron deficiency anemia   . Hemorrhoids   . B12 deficiency   . COPD (chronic obstructive pulmonary disease)     (Dr. Raul Del)  . History of ileostomy   . History of pneumonia 2012  . Shortness of breath   . Chronic a-fib     on pradaxa, declined cardioversion in past  . History of bladder cancer 1984    s/p ileal conduit  . GI bleed   . CHF (congestive heart failure)     diastolic. Echo 5/10 with mild LVH, EF 91%, grade 1 diastolic dysfunction, severe left atrial enlargement, aortic sclerosis  .  Gastric varices   . Arthritis   . Pinched nerve     back    Past Surgical History  Procedure Laterality Date  . Ileostomy  1984    bladder cancer  . Prostatectomy  1984    bladder cancer  . Penile prosthesis implant  1990    inflatable implant  . US echocardiography  05/1995    EF 60% TR, AI  . Colonoscopy  07/24/2003    Int. hemorrhoids  . Be  08/08/2003  . Colonoscopy  10/26/2006    Int hemorrhoids, 10 yrs  . Esophagogastroduodenoscopy  10/26/2006    Gastric varices, pyloric stenosis  . Bladder removal      s/p urostomy bag  . Esophagogastroduodenoscopy  07/01/2011    Procedure: ESOPHAGOGASTRODUODENOSCOPY (EGD);  Surgeon: Jerene Bears, MD;  Location: [place];  Service: Gastroenterology;  Laterality: N/A;  . Esophagogastroduodenoscopy  06/28/2011    Procedure: ESOPHAGOGASTRODUODENOSCOPY (EGD);  Surgeon: Jerene Bears, MD;  Location: [place];  Service: Gastroenterology;  Laterality: N/A;  . Fetal blood transfusion     Family History  Problem Relation Age of Onset  . Hypertension Mother   . Osteoarthritis Mother   . Heart disease Father     MI  . Coronary artery disease Sister   . Diabetes Brother   . Heart disease  Brother     MI  . Coronary artery disease Sister   . Colon cancer Neg Hx   . Stroke Other     GPs   History  Substance Use Topics  . Smoking status: Former Smoker -- 1.00 packs/day for 45 years    Types: Cigarettes    Quit date: 06/22/1989  . Smokeless tobacco: Never Used     Comment: Quit 1990  . Alcohol Use: No   Current Outpatient Prescriptions  Medication Sig Dispense Refill  . acetaminophen (TYLENOL) 325 MG tablet Take 325 mg by mouth every 6 (six) hours as needed.    Marland Kitchen albuterol (PROVENTIL HFA;VENTOLIN HFA) 108 (90 BASE) MCG/ACT inhaler Inhale 2 puffs into the lungs every 6 (six) hours as needed for wheezing.    . cloNIDine (CATAPRES) 0.2 MG tablet take 1 tablet by mouth twice a day 60 tablet 6  . Cyanocobalamin (B-12) 2500 MCG TABS  Take 1 tablet by mouth daily.    Marland Kitchen doxazosin (CARDURA) 8 MG tablet take 1/2 tablet by mouth twice a day 30 tablet 3  . ferrous sulfate (FERRO-BOB) 325 (65 FE) MG tablet Take 1 tablet (325 mg total) by mouth daily with breakfast.    . furosemide (LASIX) 20 MG tablet take 1 tablet by mouth twice a day 60 tablet 3  . losartan (COZAAR) 100 MG tablet take 1 tablet place once daily 90 tablet 3  . magnesium oxide (MAG-OX) 400 (241.3 MG) MG tablet take 1 tablet by mouth once daily 90 tablet 3  . metFORMIN (GLUCOPHAGE) 500 MG tablet Take 0.5 tablets (250 mg total) by mouth daily with breakfast. 45 tablet 1  . nadolol (CORGARD) 40 MG tablet take 1 tablet by mouth once daily 30 tablet 11  . ONE TOUCH LANCETS MISC One daily and as needed/ ICD code 250.00     . ONE TOUCH ULTRA TEST test strip TEST as directed 100 each 11  . pantoprazole (PROTONIX) 40 MG tablet take 1 tablet by mouth once daily 30 tablet 3  . Respiratory Therapy Supplies (NEBULIZER) DEVI by Does not apply route. Use as directed     . simvastatin (ZOCOR) 20 MG tablet take 1 tablet by mouth once daily 30 tablet 6   No current facility-administered medications for this visit.   No Known Allergies    Review of Systems: Gen: Denies any fever, chills, sweats, anorexia, fatigue, weakness, malaise, weight loss, and sleep disorder CV: Denies chest pain, angina, palpitations, syncope, orthopnea, PND, peripheral edema, and claudication. Resp: Denies dyspnea at rest, dyspnea with exercise, cough, sputum, wheezing, coughing up blood, and pleurisy. GI: Denies vomiting blood, jaundice, and fecal incontinence.   Denies dysphagia or odynophagia. GU : Denies urinary burning, blood in urine, urinary frequency, urinary hesitancy, nocturnal urination, and urinary incontinence. MS: Denies joint pain, limitation of movement, and swelling, stiffness, low back pain, extremity pain. Denies muscle weakness, cramps, atrophy.  Derm: Denies rash, itching, dry skin,  hives, moles, warts, or unhealing ulcers.  Psych: Denies depression, anxiety, memory loss, suicidal ideation, hallucinations, paranoia, and confusion. Heme: Denies bruising, bleeding, and enlarged lymph nodes. Neuro:  Denies any headaches, dizziness, paresthesia Endo:  Denies any problems with DM, thyroid, adrenal     Physical Exam: General: Pleasant, well developed male in no acute distress Head: Normocephalic and atraumatic Eyes:  sclerae anicteric, conjunctiva pink  Ears: Normal auditory acuity Lungs: Clear throughout to auscultation Heart: irreg ireeg Abdomen: Soft, non distended, non-tender. No masses, no hepatomegaly. Normal bowel sounds.  No HSM. Musculoskeletal: Symmetrical with no gross deformities  Extremities: No edema  Neurological: Alert oriented x 4, grossly nonfocal Psychological:  Alert and cooperative. Normal mood and affect  Assessment and Recommendations: #1. History of bleeding gastric AVM. The patient is at high risk for a GI bleed if on anticoagulation, and it is preferable if anticoagulation could be avoided as gastric varices cannot be banded. It would be ideal to avoid anticoagulation, but if absolutely necessary the patient needs to be aware that he may develop a potentially life-threatening bleed from gastric varices.  #2. Cryptogenic cirrhosis with gastric varices. As stated above although his varices have not recently bled they are at higher risk of bleeding on anticoagulation. Patient should continue his now until all to reduce portal pressures.  #3. Liver lesion on MRI in 2013 with findings typical for small hemangioma. Adenoma in early malignancy could not be fully excluded. At his visit in February 2014 an AFP and MRI were ordered however the patient never had them done. These will be scheduled for him today. He will follow up in 1 year sooner if needed.    Arda Daggs, Vita Barley PA-C 02/27/2014,

## 2014-02-28 LAB — AFP TUMOR MARKER: AFP-Tumor Marker: 4.6 ng/mL (ref <6.1)

## 2014-03-04 ENCOUNTER — Other Ambulatory Visit: Payer: Self-pay | Admitting: Family Medicine

## 2014-03-11 ENCOUNTER — Ambulatory Visit: Payer: Self-pay | Admitting: Physician Assistant

## 2014-03-12 ENCOUNTER — Ambulatory Visit (HOSPITAL_COMMUNITY): Payer: Medicare Other

## 2014-03-20 ENCOUNTER — Other Ambulatory Visit: Payer: Self-pay | Admitting: Cardiovascular Disease

## 2014-03-25 ENCOUNTER — Ambulatory Visit (INDEPENDENT_AMBULATORY_CARE_PROVIDER_SITE_OTHER): Payer: Medicare Other | Admitting: Cardiovascular Disease

## 2014-03-25 ENCOUNTER — Encounter: Payer: Self-pay | Admitting: Cardiovascular Disease

## 2014-03-25 VITALS — BP 144/58 | HR 64 | Ht 68.0 in | Wt 218.8 lb

## 2014-03-25 DIAGNOSIS — E785 Hyperlipidemia, unspecified: Secondary | ICD-10-CM

## 2014-03-25 DIAGNOSIS — I5032 Chronic diastolic (congestive) heart failure: Secondary | ICD-10-CM

## 2014-03-25 DIAGNOSIS — I4891 Unspecified atrial fibrillation: Secondary | ICD-10-CM

## 2014-03-25 DIAGNOSIS — I10 Essential (primary) hypertension: Secondary | ICD-10-CM

## 2014-03-25 NOTE — Assessment & Plan Note (Signed)
Encouraged him to stay on Lasix 20 mg twice a day Appears relatively euvolemic

## 2014-03-25 NOTE — Assessment & Plan Note (Signed)
Blood pressure is well controlled on today's visit. No changes made to the medications. 

## 2014-03-25 NOTE — Patient Instructions (Signed)
You are doing well. No medication changes were made.  Please call the office if you have any symptoms concerning for stoke or baby stroke  Please call us if you have new issues that need to be addressed before your next appt.  Your physician wants you to follow-up in: 6 months.  You will receive a reminder letter in the mail two months in advance. If you don't receive a letter, please call our office to schedule the follow-up appointment.

## 2014-03-25 NOTE — Assessment & Plan Note (Signed)
Encouraged him to stay on his simvastatin. Goal LDL less than 70

## 2014-03-25 NOTE — Progress Notes (Signed)
Patient ID: [name], male    DOB: Oct 02, 1933, 81 y.o.   MRN: [id number]  HPI Comments: [name] is a very pleasant 81 yo gentleman with a long history of smoking, suspected COPD, no known coronary artery disease,    mid May 2012 with respiratory distress, right lower lobe pneumonia, new atrial fibrillation,  admission to [facility] in early 2013 for right upper lobe pneumonia. GI bleed June 2013, followed by Dr. Fuller Plan for gastric varices, AV malformations on upper GI,  Admission 09/12/2011 for COPD exacerbation. He presents for routine followup today of his atrial fibrillation  In follow-up, he reports that he is doing well. Continues to be in chronic atrial fibrillation. Relatively asymptomatic. No significant shortness of breath, leg swelling. No significant cough No recent TIA symptoms. He does not want anticoagulation He has seen GI and was felt to be high risk given varices.  EKG today showing atrial fibrillation with good rate control in the 60s  Other past medical history  Hemoglobin A1c down to 6.3, total cholesterol 150, LDL 65  Previous episode of atrial fibrillation in December 2013.  He did not restart his anticoagulation as it was very expensive and he was in the donut hole. He is also concerned about recurrent GI bleed. Previous leg edema on amlodipine  Previous hospital admission June 27 2011  with GI bleed.  Upper endoscopy Showed angiodysplasias with ablation, gastric varices. He was started on proton pump inhibitor. He was also treated for C. difficile . Hematocrit reached  low 20s on admission.  his anticoagulation was held at discharge . His dose of Coreg was also significantly decreased at discharge from 25 mg to 6.25 mg twice a day.  Echocardiogram May 2013 shows normal LV systolic function, greater than 55%, mildly dilated left atrium, no diastolic dysfunction   No Known Allergies  Outpatient Encounter Prescriptions as of 03/25/2014  Medication Sig  . acetaminophen  (TYLENOL) 325 MG tablet Take 325 mg by mouth every 6 (six) hours as needed.  Marland Kitchen albuterol (PROVENTIL HFA;VENTOLIN HFA) 108 (90 BASE) MCG/ACT inhaler Inhale 2 puffs into the lungs every 6 (six) hours as needed for wheezing.  . cloNIDine (CATAPRES) 0.2 MG tablet take 1 tablet by mouth twice a day  . Cyanocobalamin (B-12) 2500 MCG TABS Take 1 tablet by mouth daily.  Marland Kitchen doxazosin (CARDURA) 8 MG tablet TAKE 1/2 TABLET BY MOUTH TWICE A DAY  . ferrous sulfate (FERRO-BOB) 325 (65 FE) MG tablet Take 1 tablet (325 mg total) by mouth daily with breakfast.  . furosemide (LASIX) 20 MG tablet take 1 tablet by mouth twice a day  . losartan (COZAAR) 100 MG tablet take 1 tablet by mouth once daily  . magnesium oxide (MAG-OX) 400 (241.3 MG) MG tablet take 1 tablet by mouth once daily  . metFORMIN (GLUCOPHAGE) 500 MG tablet Take 0.5 tablets (250 mg total) by mouth daily with breakfast.  . nadolol (CORGARD) 40 MG tablet take 1 tablet by mouth once daily  . ONE TOUCH LANCETS MISC One daily and as needed/ ICD code 250.00   . ONE TOUCH ULTRA TEST test strip TEST as directed  . pantoprazole (PROTONIX) 40 MG tablet take 1 tablet by mouth once daily  . Respiratory Therapy Supplies (NEBULIZER) DEVI by Does not apply route. Use as directed   . simvastatin (ZOCOR) 20 MG tablet take 1 tablet by mouth once daily    Past Medical History  Diagnosis Date  . Diabetes mellitus, type 2   .  Hyperlipidemia   . Hypertension   . Cryptogenic cirrhosis     Followed by Dr Fuller Plan, never biopsied, has been stable.  History of gastric varices.  . Pyloric stenosis     mild EGD 10/26/06  . Inguinal hernia     left  . Iron deficiency anemia   . Hemorrhoids   . B12 deficiency   . COPD (chronic obstructive pulmonary disease)     (Dr. Raul Del)  . History of ileostomy   . History of pneumonia 2012  . Shortness of breath   . Chronic a-fib     on pradaxa, declined cardioversion in past  . History of bladder cancer 1984    s/p ileal  conduit  . GI bleed   . CHF (congestive heart failure)     diastolic. Echo 5/10 with mild LVH, EF 23%, grade 1 diastolic dysfunction, severe left atrial enlargement, aortic sclerosis  . Gastric varices   . Arthritis   . Pinched nerve     back    Past Surgical History  Procedure Laterality Date  . Ileostomy  1984    bladder cancer  . Prostatectomy  1984    bladder cancer  . Penile prosthesis implant  1990    inflatable implant  . US echocardiography  05/1995    EF 60% TR, AI  . Colonoscopy  07/24/2003    Int. hemorrhoids  . Be  08/08/2003  . Colonoscopy  10/26/2006    Int hemorrhoids, 10 yrs  . Esophagogastroduodenoscopy  10/26/2006    Gastric varices, pyloric stenosis  . Bladder removal      s/p urostomy bag  . Esophagogastroduodenoscopy  07/01/2011    Procedure: ESOPHAGOGASTRODUODENOSCOPY (EGD);  Surgeon: Jerene Bears, MD;  Location: [place];  Service: Gastroenterology;  Laterality: N/A;  . Esophagogastroduodenoscopy  06/28/2011    Procedure: ESOPHAGOGASTRODUODENOSCOPY (EGD);  Surgeon: Jerene Bears, MD;  Location: [place];  Service: Gastroenterology;  Laterality: N/A;  . Fetal blood transfusion      Social History  reports that he quit smoking about 24 years ago. His smoking use included Cigarettes. He has a 45 pack-year smoking history. He has never used smokeless tobacco. He reports that he does not drink alcohol or use illicit drugs.  Family History family history includes Coronary artery disease in his sister and sister; Diabetes in his brother; Heart disease in his brother and father; Hypertension in his mother; Osteoarthritis in his mother; Stroke in his other. There is no history of Colon cancer.   Review of Systems  Constitutional: Negative.   Respiratory: Negative.   Cardiovascular: Negative.   Gastrointestinal: Negative.   Musculoskeletal: Positive for gait problem.  Neurological: Negative.   Hematological: Negative.   Psychiatric/Behavioral:  Negative.   All other systems reviewed and are negative. BP 144/58 mmHg  Pulse 64  Ht 5\' 8"  (1.727 m)  Wt 218 lb 12 oz (99.224 kg)  BMI 33.27 kg/m2  Physical Exam  Constitutional: He is oriented to person, place, and time. He appears well-developed and well-nourished.  HENT:  Head: Normocephalic.  Nose: Nose normal.  Mouth/Throat: Oropharynx is clear and moist.  Eyes: Conjunctivae are normal. Pupils are equal, round, and reactive to light.  Neck: Normal range of motion. Neck supple. No JVD present.  Cardiovascular: Normal rate, S1 normal, S2 normal, normal heart sounds and intact distal pulses.  An irregularly irregular rhythm present. Exam reveals no gallop and no friction rub.   No murmur heard. Pulmonary/Chest: Effort normal. No respiratory distress. He  exhibits no tenderness.  Abdominal: Soft. Bowel sounds are normal. He exhibits no distension. There is no tenderness.  Musculoskeletal: Normal range of motion. He exhibits no edema or tenderness.  Lymphadenopathy:    He has no cervical adenopathy.  Neurological: He is alert and oriented to person, place, and time. Coordination normal.  Skin: Skin is warm and dry. No rash noted. No erythema.  Psychiatric: He has a normal mood and affect. His behavior is normal. Judgment and thought content normal.      Assessment and Plan   Nursing note and vitals reviewed.

## 2014-03-25 NOTE — Assessment & Plan Note (Signed)
Chronic atrial fibrillation. Not on anticoagulation given prior GI bleed, underlying varices, AV malformations. Felt to be high risk of recurrent bleed. Discussed at length with patient. He is okay with no anticoagulation and is aware of the risk

## 2014-04-11 ENCOUNTER — Other Ambulatory Visit: Payer: Self-pay | Admitting: Family Medicine

## 2014-04-17 ENCOUNTER — Other Ambulatory Visit: Payer: Self-pay | Admitting: Family Medicine

## 2014-04-23 ENCOUNTER — Other Ambulatory Visit: Payer: Self-pay | Admitting: Family Medicine

## 2014-05-19 NOTE — H&P (Signed)
PATIENT NAME:  [name], [name] MR#:  [id number] DATE OF BIRTH:  July 23, 1933  DATE OF ADMISSION:  06/12/2011  REFERRING PHYSICIAN: Dr. Payton Doughty.  PRIMARY CARDIOLOGIST: Dr. Rockey Situ.   FAMILY PHYSICIAN: Dr. [name], [place].   CHIEF COMPLAINT: Shortness of breath.   HISTORY OF PRESENT ILLNESS: Mr. Cromie is a 81 year old male with significant past medical history of chronic obstructive pulmonary disease, hyperlipidemia, hypertension, type 2 diabetes, chronic respiratory failure secondary to chronic obstructive pulmonary disease on 3 to 4 liters nasal cannula at baseline and atrial fibrillation, history of aortic insufficiency. The patient reports she has history of congestive heart failure even though there is no documentation of that and no echo in the system to be seen. The patient reports he has baseline chronic respiratory failure, on 3 to 4 liters nasal cannula at home at his baseline. The patient reports he has been feeling worsening shortness of breath over the last couple of weeks where he reports it is mainly on lying supine and exertional where the patient reports he is at baseline, uses 2 to 3 pillows but has been recently sleeping while sitting up in a chair. As well, the patient reports his lower extremity has significant swelling. He reports at baseline he has mild edema, but he reports it has significantly worsened. The patient denies any chest pain, lightheadedness, dizziness, palpitations, nausea, diaphoresis, or any increase in his fluid intake and reports he is compliant with diet and medication. The patient had the chest x-ray done which did show vascular congestion and mild bilateral effusion. It did show right lung infiltrate which once compared to the last x-ray done in February, it was present then when he was admitted for pneumonia. The patient denies any wheezing, any cough, any productive phlegm. He reports he did not require an increased use of his inhalers. The patient had negative  cardiac enzymes. No significant EKG changes and was found to have sodium of 153. Requested lab to recheck the level, sodium 147. BNP of 608.  PAST MEDICAL HISTORY:  1. Hypertension.  2. Hyperlipidemia.  3. Type 2 diabetes.  4. Chronic respiratory failure.  5. Oxygen dependent chronic obstructive pulmonary disease, on 3 to 4 liters nasal cannula.  6. Atrial fibrillation, on Pradaxa, rate controlled.  7. History of prostate cancer.  8. Anemia of chronic disease with iron deficiency as well as B12 deficiency.  42. Former tobacco abuse.  10. Cryptogenic cirrhosis.  11. Gastric varices.   12. Pyloric stenosis.  13. History of aortic insufficiency.  14. History of adrenal insufficiency.  53. Hospitalized May 2012 for pneumonia as well February 2013.  16. Left inguinal hernia.   ALLERGIES: No known drug allergies.   HOME MEDICATIONS:  1. Tylenol 325 mg daily as needed.  2. Albuterol/ipratropium 103/18 mcg inhalation as needed every four hours.  3. Amaryl 2 milligrams daily.  4. Amlodipine 10 mg oral daily.  5. Aspirin 81 mg daily.  6. Betamethasone 0.05% topical ointment.  7. Coreg 25 mg oral 2 times a day.  8. Cyanocobalamin 2,500 mg sublingual daily.  9. Lasix 20 mg daily.  10. Glimepiride 2 mg daily.  11. Metformin 500 mg 2 times a day.  12. Pradaxa 150 mg oral 2 times a day.  13. Simvastatin 20 mg daily.   PAST SURGICAL HISTORY:  1. Prostatectomy.  2. Removal of bladder status post urostomy bag.  3. Penile implant.  4. History of ileostomy.  5. Colonoscopy revealing internal hemorrhoids.  6. Esophagogastroduodenoscopy revealing gastric varices and  pyloric stenosis.   FAMILY HISTORY: Positive for coronary artery disease and hypertension.   SOCIAL HISTORY: Former tobacco abuse, nothing currently. Quit smoking in 1990. Occasional alcohol. No history of illicit drug use.   REVIEW OF SYSTEMS: CONSTITUTIONAL: Denies any fever. Complains of weakness. EYES: Denies any blurry  vision, double vision, pain. ENT: Denies any tinnitus, ear pain, hearing loss. RESPIRATORY: Denies any cough, wheezing, or hemoptysis. Has baseline dyspnea worsened. CARDIOVASCULAR: Denies any chest pain or orthopnea, has significant edema. Has history of atrial fibrillation. GASTROINTESTINAL: Denies any nausea, vomiting, diarrhea, or abdominal pain. GENITOURINARY: Denies any renal colic or hematuria. Has urostomy. ENDOCRINE: Denies any polyuria, polydipsia, heat or cold intolerance. INTEGUMENTARY: Denies any acne, rash or itching. MUSCULOSKELETAL: Denies any back pain, cramp, gout. NEUROLOGIC: Denies any numbness, dysarthria, epilepsy, tremors. PSYCH: Denies any anxiety, insomnia, or substance abuse.   PHYSICAL EXAMINATION:  VITAL SIGNS: Temperature 98.3, pulse 69, respiratory rate 22, blood pressure 142/60, saturating 91% on 4 liters nasal cannula.   GENERAL: Well-nourished male, is comfortable in bed in no apparent distress.   HEENT: Head atraumatic, normocephalic. Pupils equal, reactive to light. Pink conjunctivae. Anicteric sclerae. Moist oral mucosa.   NECK: Supple. No thyromegaly. Had JVD 5 to 8 cm.   CHEST: The patient had good air entry bilaterally with bibasilar crackles. No wheezing.   ABDOMEN: Obese, soft, nontender, nondistended. Bowel sounds present. Urostomy plus.   EXTREMITIES: +3 edema up to the thigh, warm. No clubbing. No cyanosis.   NEUROLOGIC: Cranial nerves grossly intact. Motor five out of five in four extremities.   PSYCHIATRIC: Appropriate affect. Awake, alert x3. Intact judgment and insight.   LABORATORY, RADIOLOGICAL AND DIAGNOSTIC DATA: BNP 628, sodium 153, rechecked 147, glucose 153, BUN 20, creatinine 0.98, chloride 113, CO2 28, total protein 7.5, albumin 4.1, total bilirubin 0.8, alkaline phosphatase 110, AST 22, ALT 20. Troponin less than 0.02. White blood cells 6.6, hemoglobin 11.3, hematocrit 34, platelet 172. Chest x-ray: Vascular congestion, mild bilateral  pleural effusion, has right mid to lower lung opacity present on last x-ray on February of this year.   ASSESSMENT AND PLAN:  1. Acute on chronic respiratory failure. The patient does not appear to be in any chronic obstructive pulmonary disease exacerbation, appears to be at his baseline. Denies any wheezing, any cough, any productive phlegm. We will continue him on Spiriva and nebulizer treatment as needed. His respiratory failure appears to be most likely due to congestive heart failure at this point. There is no documentation of previous history of congestive heart failure or any recent echo on hospital records. Will check 2-[name] echo. Will start the patient on diuresis, IV Lasix 40 mg daily. We will keep strict ins and outs. We will keep daily weights. The patient is already on beta blocker, Coreg 25 mg p.o. b.i.[name]. We will add low-dose lisinopril 5 mg oral daily. The patient had negative cardiac enzymes. Will consult the patient's primary cardiology, Dr. Rockey Situ from [facility].  2. Chronic obstructive pulmonary disease exacerbation, seems to be at baseline. Continue on Spiriva and nebulizer treatment p.r.n. and will continue the patient on Advair one puff b.i.[name].  3. History of atrial fibrillation, rate controlled, on Coreg. Continue with Pradaxa 150 mg p.o. b.i.[name]. for anticoagulation.  4. Diabetes mellitus. We will hold the patient's oral hypoglycemic agent, metformin and glimepiride and will have him on insulin sliding scale during his hospital stay.  5. Hyperlipidemia. Continue with simvastatin.  6. Anemia. Continue with B12 and iron supplement.  7. CODE  STATUS: The patient is FULL CODE.   TOTAL TIME SPENT ON PATIENT CARE: 55 minutes.   ____________________________ Albertine Patricia, MD dse:ap [name]: 06/12/2011 01:30:53 ET T: 06/12/2011 08:58:06 ET JOB#: [id number]  cc: Albertine Patricia, MD, <Dictator> Ria Bush, MD Anthea Udovich Graciela Husbands MD ELECTRONICALLY SIGNED 06/13/2011 0:06

## 2014-05-19 NOTE — Discharge Summary (Signed)
PATIENT NAME:  Lizak, Shakeel D MR#:  [id number] DATE OF BIRTH:  27-Sep-1933  DATE OF ADMISSION:  03/22/2011 DATE OF DISCHARGE:  03/24/2011  DISCHARGE DIAGNOSES:  1. Right-sided pneumonia.  2. Acute on chronic respiratory failure due to chronic obstructive pulmonary disease exacerbation. 3. Chronic atrial fibrillation. 4. Hypertension. 5. Hyperlipidemia. 6. Diabetes. 7. Anemia of chronic disease with history of B12 and iron deficiency.  8. History of cryptogenic cirrhosis.   DISPOSITION: The patient is being discharged home.   FOLLOWUP: Follow up with primary care physician and Dr. Raul Del in 1 to 2 weeks after discharge.   DIET: Low sodium, 1800 calorie ADA diet.   ACTIVITY: As tolerated.   DISCHARGE MEDICATIONS:  1. Advair 250/50, 1 puff b.i.d.  2. Spiriva 18 mcg inhaled daily.  3. Levaquin 500 mg daily for five days. 4. Prednisone taper as prescribed.  5. Tylenol 325 mg as needed.  6. Albuterol/ipratropium 2 puffs every 4 hours p.r.n.  7. Aspirin 81 mg daily.  8. Betamethasone apply to affected area as needed.  9. Cyanocobalamin 2500 mcg sublingual once a day.  10. Dabigatran 150 mg b.i.d.  11. Glimepiride 2 mg daily. 12. Losartan 100 mg daily.  13. Metformin 500 mg b.i.d.  14. Simvastatin 20 mg daily.  15. Lasix 20 mg daily.  16. Amlodipine 10 mg daily.  17. Coreg 25 mg b.i.d.  18. Ferrous sulfate 325 mg every other day. 19. Potassium gluconate 595 mg, 1 tablet once a day.   OTHER: Respiratory Therapy supplies nebulizer device, used as directed.   CONSULTATIONS: Pulmonary consultation with Dr. Raul Del.  LABORATORY, DIAGNOSTIC AND RADIOLOGICAL DATA:  Chest x-ray showed minimal right upper lobe perihilar and right lung base atelectasis versus infiltrate.  Microbiology: Blood cultures show no growth in 36 hours.  CBC is essentially normal other than hemoglobin of 11 to 10.4.  BNP 790.  Complete metabolic panel essentially normal.  Cardiac enzymes negative. Lipid  profile normal.  LDL 50, HDL 28.   HOSPITAL COURSE: The patient is a 81 year old male with past medical history of chronic respiratory failure due to chronic obstructive pulmonary disease, on home O2, chronic atrial fibrillation, hyperlipidemia, bladder cancer, who presented with shortness of breath. He was found to have right-sided pneumonia and acute on chronic respiratory failure due to chronic obstructive pulmonary disease exacerbation. The patient was admitted to the hospital and started on empiric antibiotics. Blood cultures were sent which have been negative so far. He was started on Advair, Spiriva nebulizers and IV steroids. At the time of discharge, he has been transitioned to oral steroids. A Pulmonary consultation with Dr. Raul Del was obtained, who recommended conservative management. The patient's atrial fibrillation remained stable and rate controlled. He is on a beta blocker and Pradaxa. A fasting lipid profile was checked, and his LDL is at goal. His hemoglobin A1c is 5.5. He had history of cryptogenic cirrhosis, therefore, liver function tests were checked, and he had very mildly elevated AST. During the hospital course, the patient continued to feel better and is being discharged home in a stable condition.    TIME SPENT: 45 minutes.    ____________________________ Cherre Huger, MD sp:cbb D: 03/24/2011 17:06:48 ET T: 03/24/2011 18:21:23 ET JOB#: [id number]  cc: Cherre Huger, MD, <Dictator> Cherre Huger MD ELECTRONICALLY SIGNED 03/25/2011 15:46

## 2014-05-19 NOTE — Discharge Summary (Signed)
PATIENT NAME:  [name], [name] DATE OF BIRTH:  1933/10/11  DATE OF ADMISSION:  06/12/2011 DATE OF DISCHARGE:  06/15/2011  PRIMARY CARE PHYSICIAN: [name], CNM  FINAL DIAGNOSES:  1. Acute on chronic respiratory failure. The patient requires 24/7 oxygen. The patient is only wearing his oxygen p.r.n. which could be driving congestive heart failure.  2. Acute congestive heart failure. Echocardiogram was reported completed, but I do not see results in the computer.  3. Chronic obstructive pulmonary disease, on oxygen, needs to wear this 24/7.  4. Hypertension.  5. Diabetes.  6. Hyperlipidemia.  7. Atrial fibrillation.  8. Hypernatremia, hypomagnesemia, and hypokalemia.   DISCHARGE MEDICATIONS: 1. Acetaminophen 325 mg daily.  2. Combivent 2 puffs every four hours as needed for wheezing or shortness of breath.  3. Aspirin 81 mg daily.  4. Cyanocobalamin 2500 mcg 1 tablet sublingually daily.  5. Metformin 500 mg twice a day. 6. Simvastatin 20 mg daily.  7. Pradaxa 150 mg twice a day. 8. Carvedilol 25 mg twice a day. 9. Spiriva 1 inhalation daily.  10. Lasix 40 mg p.o. twice a day. 11. K-Dur 20 milliequivalents p.o. daily.  12. Magnesium oxide 400 mg p.o. daily.  13. Lisinopril 5 mg p.o. daily.  14. Flovent 110 mcg 2 puffs twice a day. 15. Prednisone taper as written on prescription until completion. 16. Zithromax 250 mg daily for four more days then stop.   NOTE: Do not take Norvasc. Do not take Amaryl.  HOME OXYGEN: 3 liters nasal cannula.   DIET: Low sodium, 1800 ADA diet.   ACTIVITY: Activity as tolerated.  DISCHARGE FOLLOWUP: Followup with Dr. Rockey [name] in one week and Ms. [name] in two weeks.   REASON FOR ADMISSION: The patient was admitted 06/12/2011 and discharged 06/15/2011. The patient came in with shortness of breath.   HISTORY OF PRESENT ILLNESS: This is a 81 year old man with chronic obstructive pulmonary disease, hyperlipidemia,  diabetes, respiratory failure, and history of congestive heart failure. He was having worsening shortness of breath over the last few weeks. Hospitalist services were contacted for admission. For acute on chronic respiratory failure, believed secondary to congestive heart failure and was started on IV Lasix 40 mg daily. Added low dose lisinopril. Cardiology consultation was ordered, Dr. Rockey [name]. The patient and was seen in consultation by Dr. Caryl [name], cardiology, and then Dr. Rockey [name], cardiology.  LABS/RADIOLOGIC: BNP 608. Troponin negative. Glucose 153, BUN 20, creatinine 0.98, sodium 153, potassium 4.2, chloride 113, CO2 28, and calcium 9.1. Liver function tests normal. White blood cell count 6.6, hemoglobin and hematocrit 11.3 and 34.0, and platelet count 172.   Chest x-ray showed small bilateral pleural effusions, mild pulmonary edema.   EKG showed atrial fibrillation, rightward axis, nonspecific ST-T wave changes.   Magnesium 1.7.  Pulse oximetry after ambulation on room air 74%.   Creatinine upon discharge 1.13 and BUN 27.      HOSPITAL COURSE PER PROBLEM LIST:  1. For the patient's acute on chronic respiratory failure. The patient does require oxygen 24/7. This was stressed to him at length. He must wear this oxygen all the time. This could be driving his congestive heart failure.  2. Acute congestive heart failure, most likely diastolic. The patient was diuresed with IV Lasix. Cardiology saw the patient and increased Lasix to 60 mg IV three times daily and then decrease to 40 mg three times daily and switched over to 40 mg p.o. twice a day upon discharge. Low-dose lisinopril was added.  The patient is already on Coreg. The patient diuresed and lost over 10 pounds during the hospital stay. The patient still does have some edema on him. Close clinical follow-up with Dr. Rockey [name]. May need to be seen on a weekly basis.  3. Chronic obstructive pulmonary disease, on oxygen. The patient was having a  slight wheeze. He was given a prednisone taper and inhalers and a short course of Zithromax.  4. For his hypertension, blood pressure is on the lower side, 102/43. His Norvasc was held secondary to lower extremity edema.  5. For his diabetes, I restarted his Glucophage, but I held his Amaryl.  6. For his hyperlipidemia, he is on simvastatin.  7. For his atrial fibrillation, cardiology was considering a cardioversion, but when Dr. Rockey [name] saw the patient he wanted to follow him up as an outpatient. With his respiratory status, he is high risk. He may consider amiodarone prior to a cardioversion as an outpatient, but he is rate controlled on Coreg. He is anticoagulated with Pradaxa.  8. For his hypernatremia, hypomagnesemia, and hypokalemia he is given oral magnesium upon discharge. He did receive IV magnesium in the hospital. For his hypokalemia, he did receive potassium supplementation with his high dose Lasix and this will be continued as an outpatient. His hypernatremia improved. Recheck a BMP with follow-up appointment. Close clinical follow-up on kidney function with diuretics needed. If his creatinine goes above 1.4, then the Glucophage will be contraindicated.  TIME SPENT ON DISCHARGE: 35 minutes.  ____________________________ Michael Conch. Leslye Peer, MD rjw:slb D: 06/15/2011 16:41:55 ET     T: 06/16/2011 10:52:44 ET        JOB#: [id number] cc: Michael Conch. Leslye Peer, MD, <Dictator> [name], CNM Michael Merritts, MD Michael Brooklyn MD ELECTRONICALLY SIGNED 06/17/2011 13:16

## 2014-05-19 NOTE — Consult Note (Signed)
Chief Complaint:   Subjective/Chief Complaint feels better,  O2 desats noted   VITAL SIGNS/ANCILLARY NOTES: **Vital Signs.:   19-May-13 08:02   Vital Signs Type POCT   Temperature Temperature (F) 96.2   Celsius 35.6   Pulse Pulse 81   Respirations Respirations 16   Systolic BP Systolic BP 539   Diastolic BP (mmHg) Diastolic BP (mmHg) 64   Mean BP 90   Pulse Ox % Pulse Ox % 95   Pulse Ox Activity Level  At rest   Oxygen Delivery 3L   Nurse Fingerstick (mg/dL) FSBS (fasting range 65-99 mg/dL) 81   Comments/Interventions  Nurse Notified    12:32   Vital Signs Type Q 4hr   Temperature Temperature (F) 99.5   Celsius 37.5   Temperature Source oral   Pulse Pulse 60   Pulse source per Dinamap   Respirations Respirations 16   Systolic BP Systolic BP 93   Diastolic BP (mmHg) Diastolic BP (mmHg) 54   Mean BP 67   BP Source Dinamap   Pulse Ox % Pulse Ox % 94   Pulse Ox Activity Level  At rest   Oxygen Delivery 3L    12:33   Vital Signs Type POCT   Nurse Fingerstick (mg/dL) FSBS (fasting range 65-99 mg/dL) 112   Comments/Interventions  Nurse Notified  *Intake and Output.:   Daily 19-May-13 07:00   Grand Totals Intake:  240 Output:  2050    Net:  -[phone] 41 Hr.:  -[phone]   Oral Intake      In:  240   Urine ml     Out:  2050   Length of Stay Totals Intake:  240 Output:  2900    Net:  -[phone]   Brief Assessment:   Cardiac Regular  no murmur  + LE edema  3+edema    Respiratory clear BS    Gastrointestinal Normal    Gastrointestinal details normal Soft    Additional Physical Exam a and OXX3 skin some erythema   Routine Hem:  18-May-13 08:03    WBC (CBC) 5.9   RBC (CBC) 3.33   Hemoglobin (CBC) 10.0   Hematocrit (CBC) 30.5   Platelet Count (CBC) 150   MCV 92   MCH 29.9   MCHC 32.7   RDW 15.6  Routine Chem:  18-May-13 08:03    Glucose, Serum 75   BUN 19   Creatinine (comp) 0.87   Sodium, Serum 144   Potassium, Serum 3.9   Chloride, Serum 107   CO2, Serum 29    Calcium (Total), Serum 8.6   Osmolality (calc) 288   eGFR (African American) >60   eGFR (Non-African American) >60   Anion Gap 8  Routine Hem:  18-May-13 08:03    Neutrophil % 65.2   Lymphocyte % 17.6   Monocyte % 11.3   Eosinophil % 5.3   Basophil % 0.6   Neutrophil # 3.8   Lymphocyte # 1.0   Monocyte # 0.7   Eosinophil # 0.3   Basophil # 0.0  Routine Chem:  18-May-13 08:03    Magnesium, Serum 1.7  19-May-13 05:52    Glucose, Serum 70   BUN 20   Creatinine (comp) 0.94   Sodium, Serum 144   Potassium, Serum 4.0   Chloride, Serum 107   CO2, Serum 28   Calcium (Total), Serum 8.6   Osmolality (calc) 288   eGFR (African American) >60   eGFR (Non-African American) >60   Anion Gap 9  Magnesium, Serum 1.9   Assessment/Plan:  Assessment/Plan:   Assessment 1) Acute/Chronic CHF [presumed HFPEF 2) COPD 3) AF CVR 4) Anemia    Plan 1) continue aggressive diuresis, until Bun/Cr bump 2) 24/7 O2 reviewed the importance with him 3) discussion in am with Dr Deidre Ala re DCCV, pt amenable 4) echo P 5) follow Cr and K   Electronic Signatures: Deboraha Sprang (MD)  (Signed 19-May-13 14:08)  Authored: Chief Complaint, VITAL SIGNS/ANCILLARY NOTES, Brief Assessment, Lab Results, Assessment/Plan   Last Updated: 19-May-13 14:08 by Deboraha Sprang (MD)

## 2014-05-19 NOTE — H&P (Signed)
PATIENT NAME:  [name], [name] MR#:  [id number] DATE OF BIRTH:  [birth year]  DATE OF ADMISSION:  03/22/2011  REFERRING PHYSICIAN: Dr. Mariea Clonts PRIMARY CARE PHYSICIAN: Dr. Shella Spearing  PRIMARY PULMONOLOGIST: Dr. Raul Del  CHIEF COMPLAINT: "I couldn't breathe good."   HISTORY OF PRESENT ILLNESS: Patient is a pleasant 81 year old male with past medical history as listed below presents to the Emergency Department with complaints of worsening shortness of breath over the past three weeks associated with nonproductive cough and some chills. Denies any fevers. He normally wears oxygen at home chronically at 3 to 4 L/min via nasal cannula continuously. He is using his nebulizers and inhalers at home as well with mild improvement of his shortness of breath. He denies any recent travel or sick contact exposure. He also reports lower extremity swelling for which he is on Lasix at home. States he has been feeling worse ever since prednisone was stopped a couple of weeks ago. Also reports some wheezing. He came to the ER for further evaluation and chest x-ray suggestive of possible right upper lobe pneumonia. Otherwise he is without specific complaints at this time. Hospitalist services were contacted for further evaluation and for hospital admission.   PAST SURGICAL HISTORY:  1. Prostatectomy. 2. Removal of bladder status post urostomy bag.  3. Penile implant.  4. History of ileostomy.  5. Colonoscopy revealing internal hemorrhoids. 6. EGD revealing gastric varices and pyloric stenosis.   PAST MEDICAL HISTORY:  1. Hypertension. 2. Hyperlipidemia. 3. Type 2 diabetes mellitus. 4. Chronic respiratory failure. 5. Oxygen-dependent chronic obstructive pulmonary disease.  6. Atrial fibrillation.  7. History of bladder cancer.  8. Anemia of chronic disease with iron deficiency as well as B12 deficiency. 41. Former tobacco abuse.  10. Cryptogenic cirrhosis.  11. Gastric varices. 12. Pyloric stenosis.   13. History of aortic insufficiency.  14. History of renal insufficiency.  21. Hospitalization May 2012 for bilateral pneumonia.  16. Left inguinal hernia.   ALLERGIES: No known drug allergies.   HOME MEDICATIONS:  1. Acetaminophen 325 mg daily as needed.  2. DuoNebs 2 puffs inhaled q.4 hours p.r.n.  3. Amlodipine 10 mg daily. 4. Aspirin 81 mg daily.  5. Betamethasone dipropionate Augmentin 0.05% topical ointment apply to affected area topically as needed. 6. Coreg 25 mg b.i.[name].  7. Cyanocobalamin 25 mcg sublingually daily.  8. Pradaxa 150 mg p.o. every 12 hours.  9. Iron sulfate 325 mg every other day.  10. Lasix 20 mg daily. 11. Glimepiride 2 mg daily.  12. Losartan 100 mg daily.  13. Metformin 500 mg b.i.[name].  14. Potassium gluconate 595 mg daily. 15. Zocor 20 mg daily.   FAMILY HISTORY: Positive for coronary artery disease and hypertension.   SOCIAL HISTORY: Tobacco-Former abuse, none currently. In the past he was smoking 1 pack per day for 45 years, quit in 1990. Alcohol-Occasional beer. Illicit drugs-None. Patient lives at home with his wife.   REVIEW OF SYSTEMS: CONSTITUTIONAL: Reports shortness of breath and chills. Denies fevers, pain, or recent changes in weight. HEAD/EYES: Denies headache or blurry vision. ENT: Denies tinnitus, earache, nasal discharge, sore throat. RESPIRATORY: Reports shortness of breath, cough, and wheezing. CARDIOVASCULAR: Denies chest pain, heart palpitations. Has lower extremity edema. GASTROINTESTINAL: Denies nausea, vomiting, diarrhea, constipation, melena, hematochezia, abdominal pain. GENITOURINARY: Denies dysuria, hematuria. ENDOCRINE: Denies heat or cold intolerance. HEME/LYMPH: Denies easy bruising or bleeding. INTEGUMENT: Denies rash or lesions. MUSCULOSKELETAL: Denies joint or back pain syndrome. NEUROLOGICAL: Denies headache, numbness, weakness, tingling or dysarthria. PSYCH: Denies depression  or anxiety.    PHYSICAL EXAMINATION:  VITAL SIGNS:  Temperature 98.1, pulse 86, blood pressure 159/67, respirations 18, oxygen saturation 95% on 2.5 liters nasal cannula.   GENERAL: Patient alert and oriented, not acutely distressed.   HEENT: Normocephalic, atraumatic. Pupils are equal, round, and reactive to light and accommodation. Extraocular movements are intact. Anicteric sclerae. Conjunctivae pink. Hearing intact to voice. Nares without drainage. Oral mucosa moist without lesions.   NECK: Supple with full range of motion. No JVD, lymphadenopathy or carotid bruits bilaterally. No thyromegaly or tenderness to palpation over thyroid gland.    CHEST: Normal respiratory effort without use of accessory respiratory muscles. Patient has right upper lobe wheezing and some crackles noted and some coarse breath sounds as well. Overall he has got decreased breath sounds bilaterally with hyperresonance to percussion. He has got some scattered wheezing in the left lung field as well but right greater than left.   CARDIOVASCULAR: Irregularly irregular. No murmurs, rubs, or gallops. PMI is non- lateralized.   ABDOMEN: Soft, nontender, nondistended. Normoactive bowel sounds. No hepatosplenomegaly or palpable masses. No hernias.   EXTREMITIES: 2+ pitting edema bilateral lower extremities without clubbing, cyanosis, erythema, or warmth. Pedal pulses are palpable bilaterally.   SKIN: No suspicious rashes.   LYMPH: No cervical lymphadenopathy.   NEUROLOGICAL: Alert and oriented x3. Cranial nerves II through XII grossly intact. No focal deficits.   PSYCH: Pleasant with appropriate affect.   LABORATORY, DIAGNOSTIC, AND RADIOLOGICAL DATA: Chest x-ray PA and lateral findings concerning for minimal right upper lobe perihilar infiltrate in right lung base, infiltrate versus atelectasis. Trace pleural effusions are present. Right upper lobe infiltrate is more prominent posteriorly on the lateral view.   BNP 790.   Troponin less than 0.02.   BMP: Sodium 146,  potassium 4.2, chloride 108, bicarbonate 26, BUN 14, creatinine 0.76, glucose 101, calcium 8.8, anion gap 12.   CBC: WBC 8, hemoglobin 11, hematocrit 32.9, platelets 210, MCV 91.   EKG: Atrial fibrillation 76 beats per minute with right axis deviation, nonspecific ST and T wave changes. No acute ischemic changes noted.   ASSESSMENT AND PLAN: 81 year old male with extensive past medical history including hypertension, diabetes mellitus, hyperlipidemia, chronic anemia, bladder cancer, chronic respiratory failure oxygen dependent, chronic obstructive pulmonary disease, atrial fibrillation, cryptogenic cirrhosis here with worsening shortness of breath over the past three weeks associated with nonproductive cough, chills, and wheezing, also with some lower extremity swelling here with:  1. Right upper lobe pneumonia-Suspect possible community-acquired pneumonia. Recommend hospital admission for further evaluation and management. Blood cultures have been obtained and will start IV antibiotics in the form of Rocephin and azithromycin and monitor clinical response. Continue oxygen support. Will manage his chronic obstructive pulmonary disease exacerbation as below. Further work-up and management to follow depending on patient's clinical course and please see below for further details.  2. Acute chronic obstructive pulmonary disease exacerbation-Due to pneumonia. Keep patient on supplemental oxygen and titrate to keep his sats above 90%. Start IV steroids. IV antibiotics as above for pneumonia and follow up blood cultures. Bronchodilator support with DuoNebs scheduled for now and also p.r.n. and also give p.r.n. albuterol metered-dose inhaler. Also start Advair and Spiriva. Obtain pulmonology consultation for further recommendations.  3. Chronic atrial fibrillation-Continue rate control with beta blocker and continue Pradaxa for cerebrovascular accident prophylaxis and monitor patient on off unit telemetry.   4. Lower extremity edema-With echocardiogram 06/10/2010 benign revealing LVEF greater than 55%. Patient's BNP is 790. His lower extremity edema could  be caused from low albumin caused by cirrhosis and we will await LFTs versus underlying venous insufficiency. He will be on IV diuresis for now with Lasix and will monitor ins and outs closely and also keep a close eye on his renal function and also apply TED compression stockings to bilateral lower extremities.  5. Hypertension-Continue Coreg, Norvasc and losartan as well as Lasix and monitor blood pressure closely. 6. Hyperlipidemia-Continue Zocor. Check fasting lipid profile in a.m. and also check transaminases.  7. Type 2 diabetes mellitus-Hold metformin for now. Keep patient on glimepiride and sliding scale insulin.  8. Anemia of chronic disease with history of iron deficiency and B12 deficiency-Continue B12 and iron supplementation and follow hemoglobin and hematocrit closely. Currently there are no indications for blood transfusion. 9. History of cryptogenic cirrhosis-Check liver function tests.  10. Deep vein thrombosis prophylaxis-Pradaxa. 11. CODE STATUS: FULL CODE.   TIME SPENT ON ADMISSION: Approximately 50 minutes.   ____________________________ Romie Jumper, MD knl:cms [name]: 03/22/2011 21:31:06 ET T: 03/23/2011 06:41:55 ET JOB#: [id number]  cc: Romie Jumper, MD, <Dictator> Herbon E. Raul Del, MD Ria Bush, MD Romie Jumper MD ELECTRONICALLY SIGNED 03/31/2011 21:06

## 2014-07-08 ENCOUNTER — Other Ambulatory Visit: Payer: Self-pay | Admitting: Cardiovascular Disease

## 2014-07-14 ENCOUNTER — Other Ambulatory Visit: Payer: Self-pay | Admitting: Cardiovascular Disease

## 2014-07-24 ENCOUNTER — Other Ambulatory Visit: Payer: Self-pay

## 2014-07-24 MED ORDER — DOXAZOSIN MESYLATE 8 MG PO TABS: 4.0000 mg | ORAL_TABLET | Freq: Two times a day (BID) | ORAL | Status: DC

## 2014-07-24 NOTE — Telephone Encounter (Signed)
Refill sent for doxazosin ?

## 2014-07-31 ENCOUNTER — Other Ambulatory Visit: Payer: Self-pay | Admitting: Family Medicine

## 2014-08-26 ENCOUNTER — Ambulatory Visit: Payer: Medicare Other | Admitting: Family Medicine

## 2014-08-28 ENCOUNTER — Other Ambulatory Visit: Payer: Self-pay | Admitting: Family Medicine

## 2014-09-09 ENCOUNTER — Encounter: Payer: Self-pay | Admitting: Family Medicine

## 2014-09-09 ENCOUNTER — Ambulatory Visit (INDEPENDENT_AMBULATORY_CARE_PROVIDER_SITE_OTHER): Payer: Medicare Other | Admitting: Family Medicine

## 2014-09-09 VITALS — BP 136/62 | HR 60 | Temp 98.2°F | Wt 216.0 lb

## 2014-09-09 DIAGNOSIS — K31819 Angiodysplasia of stomach and duodenum without bleeding: Secondary | ICD-10-CM

## 2014-09-09 DIAGNOSIS — Z23 Encounter for immunization: Secondary | ICD-10-CM | POA: Diagnosis not present

## 2014-09-09 DIAGNOSIS — I10 Essential (primary) hypertension: Secondary | ICD-10-CM | POA: Diagnosis not present

## 2014-09-09 DIAGNOSIS — I4891 Unspecified atrial fibrillation: Secondary | ICD-10-CM

## 2014-09-09 DIAGNOSIS — E118 Type 2 diabetes mellitus with unspecified complications: Secondary | ICD-10-CM

## 2014-09-09 DIAGNOSIS — K7469 Other cirrhosis of liver: Secondary | ICD-10-CM | POA: Diagnosis not present

## 2014-09-09 DIAGNOSIS — D509 Iron deficiency anemia, unspecified: Secondary | ICD-10-CM

## 2014-09-09 DIAGNOSIS — L409 Psoriasis, unspecified: Secondary | ICD-10-CM

## 2014-09-09 LAB — CBC WITH DIFFERENTIAL/PLATELET
Basophils Absolute: 0 10*3/uL (ref 0.0–0.1)
Basophils Relative: 0.4 % (ref 0.0–3.0)
Eosinophils Absolute: 0.2 10*3/uL (ref 0.0–0.7)
Eosinophils Relative: 4.5 % (ref 0.0–5.0)
HEMATOCRIT: 35.4 % — AB (ref 39.0–52.0)
Hemoglobin: 11.7 g/dL — ABNORMAL LOW (ref 13.0–17.0)
Lymphocytes Relative: 28.1 % (ref 12.0–46.0)
Lymphs Abs: 1.2 10*3/uL (ref 0.7–4.0)
MCHC: 33.2 g/dL (ref 30.0–36.0)
MCV: 91 fl (ref 78.0–100.0)
MONOS PCT: 8.7 % (ref 3.0–12.0)
Monocytes Absolute: 0.4 10*3/uL (ref 0.1–1.0)
NEUTROS ABS: 2.6 10*3/uL (ref 1.4–7.7)
Neutrophils Relative %: 58.3 % (ref 43.0–77.0)
RBC: 3.89 Mil/uL — ABNORMAL LOW (ref 4.22–5.81)
RDW: 16.1 % — ABNORMAL HIGH (ref 11.5–15.5)
WBC: 4.4 10*3/uL (ref 4.0–10.5)

## 2014-09-09 LAB — BASIC METABOLIC PANEL
BUN: 16 mg/dL (ref 6–23)
CHLORIDE: 106 meq/L (ref 96–112)
CO2: 33 mEq/L — ABNORMAL HIGH (ref 19–32)
CREATININE: 1.23 mg/dL (ref 0.40–1.50)
Calcium: 9.5 mg/dL (ref 8.4–10.5)
GFR: 60.3 mL/min (ref >60.00)
Glucose, Bld: 93 mg/dL (ref 70–99)
Potassium: 4.9 mEq/L (ref 3.5–5.1)
Sodium: 144 mEq/L (ref 135–145)

## 2014-09-09 LAB — HEMOGLOBIN A1C: Hgb A1c MFr Bld: 6.2 % (ref 4.6–6.5)

## 2014-09-09 MED ORDER — TRIAMCINOLONE ACETONIDE 0.1 % EX CREA: 1.0000 "application " | TOPICAL_CREAM | Freq: Two times a day (BID) | CUTANEOUS | Status: DC

## 2014-09-09 NOTE — Patient Instructions (Addendum)
prevnar today. labwork today. Try triamcinolone steroid cream for patches of psoriasis.  Return in 6 months for medicare wellness visit.  You are doing well today, return sooner if needed.

## 2014-09-09 NOTE — Assessment & Plan Note (Signed)
Recheck CBC today to monitor.

## 2014-09-09 NOTE — Assessment & Plan Note (Signed)
Start TCI cream.

## 2014-09-09 NOTE — Progress Notes (Signed)
BP 136/62 mmHg  Pulse 60  Temp(Src) 98.2 F (36.8 C) (Oral)  Wt 216 lb (97.977 kg)   CC: 6 mo f/u visit  Subjective:    Patient ID: [name], male    DOB: 1934/01/23, 81 y.o.   MRN: [id number]  HPI: [name] is a 81 y.o. male presenting on 09/09/2014 for Follow-up and Psoriasis   Pt with DM, HTN, HLD, cryptogenic cirrhosis present for 6 mo f/u visit.  H/o chronic atrial fibrillation - not on anticoagulant 2/2 prior GI bleed, varices and AV malformations.   Psoriasis - h/o this - treated by Dr Council Mechanic for Rx cream.   Cryptogenic cirrhosis - stable.  HTN - Compliant with current antihypertensive regimen of catapres 0.1mg  bid, cardura 4mg  bid, lasix 20mg  bid, losartan 100mg  daily, nadolol 40mg  daily.  Does check blood pressures at home: 130-140/60s.  No low blood pressure readings or symptoms of syncope. Occasional dizziness. Denies HA, vision changes, CP/tightness, SOB, leg swelling.    DM - regularly does check sugars fasting 90-130, pre dinner 130-150. Compliant with antihyperglycemic regimen which includes: metformin 250mg  daily.  Denies low sugars or hypoglycemic symptoms.  Denies paresthesias. Last diabetic eye exam 12/2013.  Pneumovax: 2006.  Prevnar: DUE. Recent PNA illness. Lab Results  Component Value Date   HGBA1C 6.8* 02/05/2014   Diabetic Foot Exam - Simple   No data filed       HLD - compliant with simvastatin 20mg  daily without myalgias.   Pt states CXR cleared up on rpt CXR by Dr Leane Platt.  Relevant past medical, surgical, family and social history reviewed and updated as indicated. Interim medical history since our last visit reviewed. Allergies and medications reviewed and updated. Current Outpatient Prescriptions on File Prior to Visit  Medication Sig  . acetaminophen (TYLENOL) 325 MG tablet Take 325 mg by mouth every 6 (six) hours as needed.  . cloNIDine (CATAPRES) 0.2 MG tablet take 1 tablet by mouth twice a day  . Cyanocobalamin (B-12) 2500 MCG  TABS Take 1 tablet by mouth daily.  Marland Kitchen doxazosin (CARDURA) 8 MG tablet Take 0.5 tablets (4 mg total) by mouth 2 (two) times daily.  . ferrous sulfate (FERRO-BOB) 325 (65 FE) MG tablet Take 1 tablet (325 mg total) by mouth daily with breakfast.  . furosemide (LASIX) 20 MG tablet take 1 tablet by mouth twice a day  . glucose blood (ONE TOUCH ULTRA TEST) test strip Use to test sugar once daily and as needed Dx: E11.8  . losartan (COZAAR) 100 MG tablet take 1 tablet by mouth once daily  . magnesium oxide (MAG-OX) 400 (241.3 MG) MG tablet take 1 tablet by mouth once daily  . metFORMIN (GLUCOPHAGE) 500 MG tablet TAKE 1/2 TABLET BY MOUTH EVERY DAY WITH BREAKFAST  . nadolol (CORGARD) 40 MG tablet take 1 tablet by mouth once daily  . ONE TOUCH LANCETS MISC One daily and as needed/ ICD code 250.00   . pantoprazole (PROTONIX) 40 MG tablet take 1 tablet by mouth once daily  . simvastatin (ZOCOR) 20 MG tablet take 1 tablet by mouth once daily  . albuterol (PROVENTIL HFA;VENTOLIN HFA) 108 (90 BASE) MCG/ACT inhaler Inhale 2 puffs into the lungs every 6 (six) hours as needed for wheezing.  Marland Kitchen Respiratory Therapy Supplies (NEBULIZER) DEVI by Does not apply route. Use as directed    No current facility-administered medications on file prior to visit.    Review of Systems Per HPI unless specifically indicated above  Objective:    BP 136/62 mmHg  Pulse 60  Temp(Src) 98.2 F (36.8 C) (Oral)  Wt 216 lb (97.977 kg)  Wt Readings from Last 3 Encounters:  09/09/14 216 lb (97.977 kg)  03/25/14 218 lb 12 oz (99.224 kg)  02/27/14 220 lb (99.791 kg)    Physical Exam  Constitutional: He appears well-developed and well-nourished. No distress.  HENT:  Mouth/Throat: Oropharynx is clear and moist. No oropharyngeal exudate.  Eyes: Conjunctivae and EOM are normal. Pupils are equal, round, and reactive to light. No scleral icterus.  Neck: Normal range of motion. Neck supple. No thyromegaly present.    Cardiovascular: Normal rate, regular rhythm and normal heart sounds.   No murmur heard. Pulmonary/Chest: Effort normal and breath sounds normal. No respiratory distress. He has no wheezes. He has no rales.  Abdominal:  H/o ileostomy  Musculoskeletal: He exhibits no edema.  Lymphadenopathy:    He has no cervical adenopathy.  Skin: Skin is warm and dry. Rash noted.  Erythematous patches on anterior knees  Psychiatric: He has a normal mood and affect.  Nursing note and vitals reviewed.     Assessment & Plan:   Problem List Items Addressed This Visit    Controlled diabetes mellitus type 2 with complications - Primary    Chronic, stable.  Recheck A1c and fructosamine today (in h/o cirrhosis and anemia).      Relevant Orders   Hemoglobin R4E   Basic metabolic panel   Fructosamine   Iron deficiency anemia    Continue 1 tablet OTC iron daily      Essential hypertension    Chronic, stable. Continue current regimen.      Atrial fibrillation    Chronic, off AC due to GIB and AVMs.      Gastric and duodenal angiodysplasia    Recheck CBC today to monitor.      Relevant Orders   CBC with Differential/Platelet   Cirrhosis, cryptogenic   Psoriasis    Start TCI cream.       Other Visit Diagnoses    Need for prophylactic vaccination against Streptococcus pneumoniae (pneumococcus)        Relevant Orders    Pneumococcal conjugate vaccine 13-valent IM (Completed)        Follow up plan: Return in about 6 months (around 03/12/2015), or as needed, for medicare wellness visit.

## 2014-09-09 NOTE — Assessment & Plan Note (Signed)
Chronic, stable.  Recheck A1c and fructosamine today (in h/o cirrhosis and anemia).

## 2014-09-09 NOTE — Assessment & Plan Note (Signed)
Chronic, off AC due to GIB and AVMs.

## 2014-09-09 NOTE — Assessment & Plan Note (Addendum)
Continue 1 tablet OTC iron daily

## 2014-09-09 NOTE — Assessment & Plan Note (Signed)
Chronic, stable. Continue current regimen. 

## 2014-09-09 NOTE — Progress Notes (Signed)
Pre visit review using our clinic review tool, if applicable. No additional management support is needed unless otherwise documented below in the visit note. 

## 2014-09-11 LAB — FRUCTOSAMINE: FRUCTOSAMINE: 305 umol/L — AB (ref 190–270)

## 2014-09-23 ENCOUNTER — Encounter: Payer: Self-pay | Admitting: Cardiovascular Disease

## 2014-09-23 ENCOUNTER — Ambulatory Visit (INDEPENDENT_AMBULATORY_CARE_PROVIDER_SITE_OTHER): Payer: Medicare Other | Admitting: Cardiovascular Disease

## 2014-09-23 VITALS — BP 128/60 | HR 61 | Ht 68.0 in | Wt 216.8 lb

## 2014-09-23 DIAGNOSIS — R600 Localized edema: Secondary | ICD-10-CM

## 2014-09-23 DIAGNOSIS — R079 Chest pain, unspecified: Secondary | ICD-10-CM | POA: Diagnosis not present

## 2014-09-23 DIAGNOSIS — K31819 Angiodysplasia of stomach and duodenum without bleeding: Secondary | ICD-10-CM

## 2014-09-23 DIAGNOSIS — I10 Essential (primary) hypertension: Secondary | ICD-10-CM | POA: Diagnosis not present

## 2014-09-23 DIAGNOSIS — E118 Type 2 diabetes mellitus with unspecified complications: Secondary | ICD-10-CM

## 2014-09-23 DIAGNOSIS — E785 Hyperlipidemia, unspecified: Secondary | ICD-10-CM

## 2014-09-23 DIAGNOSIS — I4891 Unspecified atrial fibrillation: Secondary | ICD-10-CM | POA: Diagnosis not present

## 2014-09-23 NOTE — Progress Notes (Signed)
Patient ID: [name], male    DOB: 1933-08-27, 81 y.o.   MRN: [id number]  HPI Comments: [name] is a very pleasant 81 yo gentleman with a long history of smoking, suspected COPD, no known coronary artery disease,    mid May 2012 with respiratory distress, right lower lobe pneumonia, new atrial fibrillation,  admission to [facility] in early 2013 for right upper lobe pneumonia. GI bleed June 2013, followed by Dr. Fuller Plan for gastric varices, AV malformations on upper GI,  Admission 09/12/2011 for COPD exacerbation. He presents for routine followup today of his atrial fibrillation  In follow-up, he reports that he is doing well. No significant shortness of breath, leg swelling. No recent TIA symptoms.  He is currently not on anticoagulation secondary to history of GI bleeding He has seen GI and was felt to be high risk given varices. Active, working in his garden, no regular exercise program. Some gait instability. No falls. He does use a cane at times  EKG today showing atrial fibrillation with rate of 61 bpm, nonspecific T-wave abnormality inferiorly  Other past medical history  Hemoglobin A1c down to 6.3, total cholesterol 150, LDL 65  Previous episode of atrial fibrillation in December 2013.  He did not restart his anticoagulation as it was very expensive and he was in the donut hole. He is also concerned about recurrent GI bleed. Previous leg edema on amlodipine  Previous hospital admission June 27 2011  with GI bleed.  Upper endoscopy Showed angiodysplasias with ablation, gastric varices. He was started on proton pump inhibitor. He was also treated for C. difficile . Hematocrit reached  low 20s on admission.  his anticoagulation was held at discharge . His dose of Coreg was also significantly decreased at discharge from 25 mg to 6.25 mg twice a day.  Echocardiogram May 2013 shows normal LV systolic function, greater than 55%, mildly dilated left atrium, no diastolic dysfunction   No Known  Allergies  Outpatient Encounter Prescriptions as of 09/23/2014  Medication Sig  . acetaminophen (TYLENOL) 325 MG tablet Take 325 mg by mouth every 6 (six) hours as needed.  Marland Kitchen albuterol (PROVENTIL HFA;VENTOLIN HFA) 108 (90 BASE) MCG/ACT inhaler Inhale 2 puffs into the lungs every 6 (six) hours as needed for wheezing.  . cloNIDine (CATAPRES) 0.2 MG tablet take 1 tablet by mouth twice a day  . Cyanocobalamin (B-12) 2500 MCG TABS Take 1 tablet by mouth daily.  Marland Kitchen doxazosin (CARDURA) 8 MG tablet Take 0.5 tablets (4 mg total) by mouth 2 (two) times daily.  . ferrous sulfate (FERRO-BOB) 325 (65 FE) MG tablet Take 1 tablet (325 mg total) by mouth daily with breakfast.  . furosemide (LASIX) 20 MG tablet take 1 tablet by mouth twice a day  . glucose blood (ONE TOUCH ULTRA TEST) test strip Use to test sugar once daily and as needed Dx: E11.8  . losartan (COZAAR) 100 MG tablet take 1 tablet by mouth once daily  . magnesium oxide (MAG-OX) 400 (241.3 MG) MG tablet take 1 tablet by mouth once daily  . metFORMIN (GLUCOPHAGE) 500 MG tablet TAKE 1/2 TABLET BY MOUTH EVERY DAY WITH BREAKFAST  . nadolol (CORGARD) 40 MG tablet take 1 tablet by mouth once daily  . ONE TOUCH LANCETS MISC One daily and as needed/ ICD code 250.00   . pantoprazole (PROTONIX) 40 MG tablet take 1 tablet by mouth once daily  . Respiratory Therapy Supplies (NEBULIZER) DEVI by Does not apply route. Use as directed   .  simvastatin (ZOCOR) 20 MG tablet take 1 tablet by mouth once daily  . triamcinolone cream (KENALOG) 0.1 % Apply 1 application topically 2 (two) times daily. Apply to AA.   No facility-administered encounter medications on file as of 09/23/2014.    Past Medical History  Diagnosis Date  . Diabetes mellitus, type 2   . Hyperlipidemia   . Hypertension   . Cryptogenic cirrhosis     Followed by Dr Fuller Plan, never biopsied, has been stable.  History of gastric varices.  . Pyloric stenosis     mild EGD 10/26/06  . Inguinal hernia      left  . Iron deficiency anemia   . Hemorrhoids   . B12 deficiency   . COPD (chronic obstructive pulmonary disease)     (Dr. Raul Del)  . History of ileostomy   . History of pneumonia 2012  . Shortness of breath   . Chronic a-fib     on pradaxa, declined cardioversion in past  . History of bladder cancer 1984    s/p ileal conduit  . GI bleed   . CHF (congestive heart failure)     diastolic. Echo 5/10 with mild LVH, EF 62%, grade 1 diastolic dysfunction, severe left atrial enlargement, aortic sclerosis  . Gastric varices   . Arthritis   . Pinched nerve     back  . C. difficile colitis 07/06/2011  . Gastric and duodenal angiodysplasia 06/28/2011  . RLL pneumonia 01/28/2014    Past Surgical History  Procedure Laterality Date  . Ileostomy  1984    bladder cancer  . Prostatectomy  1984    bladder cancer  . Penile prosthesis implant  1990    inflatable implant  . US echocardiography  05/1995    EF 60% TR, AI  . Colonoscopy  07/24/2003    Int. hemorrhoids  . Be  08/08/2003  . Colonoscopy  10/26/2006    Int hemorrhoids, 10 yrs  . Esophagogastroduodenoscopy  10/26/2006    Gastric varices, pyloric stenosis  . Bladder removal      s/p urostomy bag  . Esophagogastroduodenoscopy  07/01/2011    Procedure: ESOPHAGOGASTRODUODENOSCOPY (EGD);  Surgeon: Jerene Bears, MD;  Location: [facility];  Service: Gastroenterology;  Laterality: N/A;  . Esophagogastroduodenoscopy  06/28/2011    Procedure: ESOPHAGOGASTRODUODENOSCOPY (EGD);  Surgeon: Jerene Bears, MD;  Location: [facility];  Service: Gastroenterology;  Laterality: N/A;  . Fetal blood transfusion      Social History  reports that he quit smoking about 25 years ago. His smoking use included Cigarettes. He has a 45 pack-year smoking history. He has never used smokeless tobacco. He reports that he does not drink alcohol or use illicit drugs.  Family History family history includes Coronary artery disease in his sister and sister;  Diabetes in his brother; Heart disease in his brother and father; Hypertension in his mother; Osteoarthritis in his mother; Stroke in his other. There is no history of Colon cancer.   Review of Systems  Constitutional: Negative.   Respiratory: Negative.   Cardiovascular: Negative.   Gastrointestinal: Negative.   Musculoskeletal: Positive for gait problem.  Neurological: Negative.   Hematological: Negative.   Psychiatric/Behavioral: Negative.   All other systems reviewed and are negative. BP 128/60 mmHg  Pulse 61  Ht 5\' 8"  (1.727 m)  Wt 216 lb 12 oz (98.317 kg)  BMI 32.96 kg/m2  Physical Exam  Constitutional: He is oriented to person, place, and time. He appears well-developed and well-nourished.  HENT:  Head:  Normocephalic.  Nose: Nose normal.  Mouth/Throat: Oropharynx is clear and moist.  Eyes: Conjunctivae are normal. Pupils are equal, round, and reactive to light.  Neck: Normal range of motion. Neck supple. No JVD present.  Cardiovascular: Normal rate, S1 normal, S2 normal, normal heart sounds and intact distal pulses.  An irregularly irregular rhythm present. Exam reveals no gallop and no friction rub.   No murmur heard. Pulmonary/Chest: Effort normal. No respiratory distress. He exhibits no tenderness.  Abdominal: Soft. Bowel sounds are normal. He exhibits no distension. There is no tenderness.  Musculoskeletal: Normal range of motion. He exhibits no edema or tenderness.  Lymphadenopathy:    He has no cervical adenopathy.  Neurological: He is alert and oriented to person, place, and time. Coordination normal.  Skin: Skin is warm and dry. No rash noted. No erythema.  Psychiatric: He has a normal mood and affect. His behavior is normal. Judgment and thought content normal.      Assessment and Plan   Nursing note and vitals reviewed.

## 2014-09-23 NOTE — Assessment & Plan Note (Signed)
We have encouraged continued exercise, careful diet management in an effort to lose weight.  Hemoglobin A1c in the 6 range

## 2014-09-23 NOTE — Patient Instructions (Signed)
You are doing well. No medication changes were made.  Please call us if you have new issues that need to be addressed before your next appt.  Your physician wants you to follow-up in: 6 months.  You will receive a reminder letter in the mail two months in advance. If you don't receive a letter, please call our office to schedule the follow-up appointment.   

## 2014-09-23 NOTE — Assessment & Plan Note (Signed)
GI pathology discussed with him  High risk of bleeding on anticoagulation  He is okay not to be on anticoagulation at this time

## 2014-09-23 NOTE — Assessment & Plan Note (Signed)
Blood pressure is well controlled on today's visit. No changes made to the medications. 

## 2014-09-23 NOTE — Assessment & Plan Note (Signed)
Cholesterol is at goal on the current lipid regimen. No changes to the medications were made.  

## 2014-09-23 NOTE — Assessment & Plan Note (Signed)
No significant edema on today's visit.  Minimal above the sock line. Recommended he stay on his Lasix

## 2014-09-23 NOTE — Assessment & Plan Note (Signed)
Heart rate relatively well-controlled  Not a good candidate for anticoagulation given previous GI bleed and underlying pathology

## 2014-11-13 ENCOUNTER — Other Ambulatory Visit: Payer: Self-pay | Admitting: Cardiovascular Disease

## 2014-11-13 ENCOUNTER — Other Ambulatory Visit: Payer: Self-pay | Admitting: Family Medicine

## 2015-01-30 ENCOUNTER — Other Ambulatory Visit: Payer: Self-pay | Admitting: Cardiovascular Disease

## 2015-02-04 ENCOUNTER — Other Ambulatory Visit: Payer: Self-pay | Admitting: Cardiovascular Disease

## 2015-02-04 NOTE — Telephone Encounter (Signed)
Requested Prescriptions   Pending Prescriptions Disp Refills  . cloNIDine (CATAPRES) 0.2 MG tablet [Pharmacy Med Name: CLONIDINE HCL 0.2 MG TABLET] 60 tablet 3    Sig: take 1 tablet by mouth twice a day

## 2015-02-27 ENCOUNTER — Other Ambulatory Visit: Payer: Self-pay | Admitting: *Deleted

## 2015-02-27 MED ORDER — LOSARTAN POTASSIUM 100 MG PO TABS: 100.0000 mg | ORAL_TABLET | Freq: Every day | ORAL | Status: DC

## 2015-02-27 MED ORDER — SIMVASTATIN 20 MG PO TABS: 20.0000 mg | ORAL_TABLET | Freq: Every day | ORAL | Status: DC

## 2015-03-05 ENCOUNTER — Other Ambulatory Visit (INDEPENDENT_AMBULATORY_CARE_PROVIDER_SITE_OTHER): Payer: Medicare Other

## 2015-03-05 ENCOUNTER — Other Ambulatory Visit: Payer: Self-pay | Admitting: Family Medicine

## 2015-03-05 DIAGNOSIS — E785 Hyperlipidemia, unspecified: Secondary | ICD-10-CM | POA: Diagnosis not present

## 2015-03-05 DIAGNOSIS — K7469 Other cirrhosis of liver: Secondary | ICD-10-CM | POA: Diagnosis not present

## 2015-03-05 DIAGNOSIS — E118 Type 2 diabetes mellitus with unspecified complications: Secondary | ICD-10-CM | POA: Diagnosis not present

## 2015-03-05 DIAGNOSIS — I4891 Unspecified atrial fibrillation: Secondary | ICD-10-CM

## 2015-03-05 DIAGNOSIS — D518 Other vitamin B12 deficiency anemias: Secondary | ICD-10-CM

## 2015-03-05 DIAGNOSIS — I10 Essential (primary) hypertension: Secondary | ICD-10-CM

## 2015-03-05 DIAGNOSIS — K31819 Angiodysplasia of stomach and duodenum without bleeding: Secondary | ICD-10-CM

## 2015-03-05 DIAGNOSIS — D509 Iron deficiency anemia, unspecified: Secondary | ICD-10-CM

## 2015-03-05 LAB — COMPREHENSIVE METABOLIC PANEL
ALBUMIN: 3.8 g/dL (ref 3.5–5.2)
ALT: 14 U/L (ref 0–53)
AST: 27 U/L (ref 0–37)
Alkaline Phosphatase: 99 U/L (ref 39–117)
BUN: 14 mg/dL (ref 6–23)
CHLORIDE: 109 meq/L (ref 96–112)
CO2: 32 mEq/L (ref 19–32)
Calcium: 9.2 mg/dL (ref 8.4–10.5)
Creatinine, Ser: 1.17 mg/dL (ref 0.40–1.50)
GFR: 63.8 mL/min (ref >60.00)
GLUCOSE: 98 mg/dL (ref 70–99)
POTASSIUM: 4.3 meq/L (ref 3.5–5.1)
SODIUM: 147 meq/L — AB (ref 135–145)
Total Bilirubin: 0.8 mg/dL (ref 0.2–1.2)
Total Protein: 6.7 g/dL (ref 6.0–8.3)

## 2015-03-05 LAB — IBC PANEL
Iron: 54 ug/dL (ref 42–165)
SATURATION RATIOS: 14.5 % — AB (ref 20.0–50.0)
TRANSFERRIN: 266 mg/dL (ref 212.0–360.0)

## 2015-03-05 LAB — CBC WITH DIFFERENTIAL/PLATELET
Basophils Absolute: 0 10*3/uL (ref 0.0–0.1)
Basophils Relative: 0.7 % (ref 0.0–3.0)
EOS PCT: 5.1 % — AB (ref 0.0–5.0)
Eosinophils Absolute: 0.2 10*3/uL (ref 0.0–0.7)
HEMATOCRIT: 37 % — AB (ref 39.0–52.0)
HEMOGLOBIN: 12.3 g/dL — AB (ref 13.0–17.0)
Lymphocytes Relative: 29.8 % (ref 12.0–46.0)
Lymphs Abs: 1.2 10*3/uL (ref 0.7–4.0)
MCHC: 33.4 g/dL (ref 30.0–36.0)
MCV: 90.7 fl (ref 78.0–100.0)
MONOS PCT: 8.9 % (ref 3.0–12.0)
Monocytes Absolute: 0.4 10*3/uL (ref 0.1–1.0)
Neutro Abs: 2.2 10*3/uL (ref 1.4–7.7)
Neutrophils Relative %: 55.5 % (ref 43.0–77.0)
Platelets: 101 10*3/uL — ABNORMAL LOW (ref 150.0–400.0)
RBC: 4.07 Mil/uL — AB (ref 4.22–5.81)
RDW: 15.5 % (ref 11.5–15.5)
WBC: 4 10*3/uL (ref 4.0–10.5)

## 2015-03-05 LAB — LIPID PANEL
Cholesterol: 104 mg/dL (ref 0–200)
HDL: 31.8 mg/dL — ABNORMAL LOW (ref >39.00)
LDL CALC: 53 mg/dL (ref 0–99)
NONHDL: 72.62
Total CHOL/HDL Ratio: 3
Triglycerides: 96 mg/dL (ref 0.0–149.0)
VLDL: 19.2 mg/dL (ref 0.0–40.0)

## 2015-03-05 LAB — FERRITIN: Ferritin: 59.5 ng/mL (ref 22.0–322.0)

## 2015-03-05 LAB — MICROALBUMIN / CREATININE URINE RATIO
CREATININE, U: 105.8 mg/dL
MICROALB UR: 14.2 mg/dL — AB (ref 0.0–1.9)
Microalb Creat Ratio: 13.4 mg/g (ref 0.0–30.0)

## 2015-03-05 LAB — LACTATE DEHYDROGENASE: LDH: 169 U/L (ref 94–250)

## 2015-03-05 LAB — HEMOGLOBIN A1C: HEMOGLOBIN A1C: 5.9 % (ref 4.6–6.5)

## 2015-03-05 LAB — TSH: TSH: 4.04 u[IU]/mL (ref 0.35–4.50)

## 2015-03-05 LAB — VITAMIN B12: Vitamin B-12: 1437 pg/mL — ABNORMAL HIGH (ref 211–911)

## 2015-03-07 LAB — FRUCTOSAMINE: Fructosamine: 296 umol/L — ABNORMAL HIGH (ref 190–270)

## 2015-03-12 ENCOUNTER — Ambulatory Visit (INDEPENDENT_AMBULATORY_CARE_PROVIDER_SITE_OTHER): Payer: Medicare Other | Admitting: Family Medicine

## 2015-03-12 ENCOUNTER — Encounter: Payer: Self-pay | Admitting: Family Medicine

## 2015-03-12 VITALS — BP 122/62 | HR 76 | Temp 97.6°F | Wt 216.8 lb

## 2015-03-12 DIAGNOSIS — I4891 Unspecified atrial fibrillation: Secondary | ICD-10-CM

## 2015-03-12 DIAGNOSIS — I5032 Chronic diastolic (congestive) heart failure: Secondary | ICD-10-CM

## 2015-03-12 DIAGNOSIS — D485 Neoplasm of uncertain behavior of skin: Secondary | ICD-10-CM

## 2015-03-12 DIAGNOSIS — I10 Essential (primary) hypertension: Secondary | ICD-10-CM

## 2015-03-12 DIAGNOSIS — Z936 Other artificial openings of urinary tract status: Secondary | ICD-10-CM

## 2015-03-12 DIAGNOSIS — Z0000 Encounter for general adult medical examination without abnormal findings: Secondary | ICD-10-CM

## 2015-03-12 DIAGNOSIS — E118 Type 2 diabetes mellitus with unspecified complications: Secondary | ICD-10-CM

## 2015-03-12 DIAGNOSIS — K31819 Angiodysplasia of stomach and duodenum without bleeding: Secondary | ICD-10-CM

## 2015-03-12 DIAGNOSIS — K7469 Other cirrhosis of liver: Secondary | ICD-10-CM

## 2015-03-12 DIAGNOSIS — D509 Iron deficiency anemia, unspecified: Secondary | ICD-10-CM

## 2015-03-12 DIAGNOSIS — E785 Hyperlipidemia, unspecified: Secondary | ICD-10-CM

## 2015-03-12 DIAGNOSIS — D638 Anemia in other chronic diseases classified elsewhere: Secondary | ICD-10-CM

## 2015-03-12 DIAGNOSIS — I864 Gastric varices: Secondary | ICD-10-CM

## 2015-03-12 DIAGNOSIS — Z7189 Other specified counseling: Secondary | ICD-10-CM

## 2015-03-12 MED ORDER — METFORMIN HCL 500 MG PO TABS: ORAL_TABLET | ORAL | Status: DC

## 2015-03-12 MED ORDER — LOSARTAN POTASSIUM 100 MG PO TABS: 100.0000 mg | ORAL_TABLET | Freq: Every day | ORAL | Status: DC

## 2015-03-12 MED ORDER — SIMVASTATIN 20 MG PO TABS: 20.0000 mg | ORAL_TABLET | Freq: Every day | ORAL | Status: DC

## 2015-03-12 MED ORDER — PANTOPRAZOLE SODIUM 40 MG PO TBEC: 40.0000 mg | DELAYED_RELEASE_TABLET | Freq: Every day | ORAL | Status: DC

## 2015-03-12 MED ORDER — DOXAZOSIN MESYLATE 8 MG PO TABS: 4.0000 mg | ORAL_TABLET | Freq: Two times a day (BID) | ORAL | Status: DC

## 2015-03-12 MED ORDER — CLONIDINE HCL 0.2 MG PO TABS: 0.2000 mg | ORAL_TABLET | Freq: Two times a day (BID) | ORAL | Status: DC

## 2015-03-12 MED ORDER — NADOLOL 40 MG PO TABS: 40.0000 mg | ORAL_TABLET | Freq: Every day | ORAL | Status: DC

## 2015-03-12 NOTE — Assessment & Plan Note (Signed)
Chronic, stable. Continue current regimen. 

## 2015-03-12 NOTE — Assessment & Plan Note (Signed)
Sounds regular today. Not anticoagulant candidate.

## 2015-03-12 NOTE — Patient Instructions (Addendum)
Decrease b12 to 574mcg daily. Bring me copy of living will.  I'd be ok if you take lower simvastatin dose (10mg  daily) - check with GI For spot on left cheek - treat with vaseline or triple antibiotic ointment and cover with bandaid daily for 1-2 weeks. If not healed with this, I recommend seeing dermatology - let us know for referral if needed.  Return in 6 months for follow up.   Health Maintenance, Male A healthy lifestyle and preventative care can promote health and wellness.  Maintain regular health, dental, and eye exams.  Eat a healthy diet. Foods like vegetables, fruits, whole grains, low-fat dairy products, and lean protein foods contain the nutrients you need and are low in calories. Decrease your intake of foods high in solid fats, added sugars, and salt. Get information about a proper diet from your health care provider, if necessary.  Regular physical exercise is one of the most important things you can do for your health. Most adults should get at least 150 minutes of moderate-intensity exercise (any activity that increases your heart rate and causes you to sweat) each week. In addition, most adults need muscle-strengthening exercises on 2 or more days a week.   Maintain a healthy weight. The body mass index (BMI) is a screening tool to identify possible weight problems. It provides an estimate of body fat based on height and weight. Your health care provider can find your BMI and can help you achieve or maintain a healthy weight. For males 20 years and older:  A BMI below 18.5 is considered underweight.  A BMI of 18.5 to 24.9 is normal.  A BMI of 25 to 29.9 is considered overweight.  A BMI of 30 and above is considered obese.  Maintain normal blood lipids and cholesterol by exercising and minimizing your intake of saturated fat. Eat a balanced diet with plenty of fruits and vegetables. Blood tests for lipids and cholesterol should begin at age 42 and be repeated every 5  years. If your lipid or cholesterol levels are high, you are over age 30, or you are at high risk for heart disease, you may need your cholesterol levels checked more frequently.Ongoing high lipid and cholesterol levels should be treated with medicines if diet and exercise are not working.  If you smoke, find out from your health care provider how to quit. If you do not use tobacco, do not start.  Lung cancer screening is recommended for adults aged 55-80 years who are at high risk for developing lung cancer because of a history of smoking. A yearly low-dose CT scan of the lungs is recommended for people who have at least a 30-pack-year history of smoking and are current smokers or have quit within the past 15 years. A pack year of smoking is smoking an average of 1 pack of cigarettes a day for 1 year (for example, a 30-pack-year history of smoking could mean smoking 1 pack a day for 30 years or 2 packs a day for 15 years). Yearly screening should continue until the smoker has stopped smoking for at least 15 years. Yearly screening should be stopped for people who develop a health problem that would prevent them from having lung cancer treatment.  If you choose to drink alcohol, do not have more than 2 drinks per day. One drink is considered to be 12 oz (360 mL) of beer, 5 oz (150 mL) of wine, or 1.5 oz (45 mL) of liquor.  Avoid the use of  street drugs. Do not share needles with anyone. Ask for help if you need support or instructions about stopping the use of drugs.  High blood pressure causes heart disease and increases the risk of stroke. High blood pressure is more likely to develop in:  People who have blood pressure in the end of the normal range (100-139/85-89 mm Hg).  People who are overweight or obese.  People who are African American.  If you are 42-8 years of age, have your blood pressure checked every 3-5 years. If you are 39 years of age or older, have your blood pressure checked  every year. You should have your blood pressure measured twice--once when you are at a hospital or clinic, and once when you are not at a hospital or clinic. Record the average of the two measurements. To check your blood pressure when you are not at a hospital or clinic, you can use:  An automated blood pressure machine at a pharmacy.  A home blood pressure monitor.  If you are 102-31 years old, ask your health care provider if you should take aspirin to prevent heart disease.  Diabetes screening involves taking a blood sample to check your fasting blood sugar level. This should be done once every 3 years after age 55 if you are at a normal weight and without risk factors for diabetes. Testing should be considered at a younger age or be carried out more frequently if you are overweight and have at least 1 risk factor for diabetes.  Colorectal cancer can be detected and often prevented. Most routine colorectal cancer screening begins at the age of 78 and continues through age 70. However, your health care provider may recommend screening at an earlier age if you have risk factors for colon cancer. On a yearly basis, your health care provider may provide home test kits to check for hidden blood in the stool. A small camera at the end of a tube may be used to directly examine the colon (sigmoidoscopy or colonoscopy) to detect the earliest forms of colorectal cancer. Talk to your health care provider about this at age 21 when routine screening begins. A direct exam of the colon should be repeated every 5-10 years through age 14, unless early forms of precancerous polyps or small growths are found.  People who are at an increased risk for hepatitis B should be screened for this virus. You are considered at high risk for hepatitis B if:  You were born in a country where hepatitis B occurs often. Talk with your health care provider about which countries are considered high risk.  Your parents were born in a  high-risk country and you have not received a shot to protect against hepatitis B (hepatitis B vaccine).  You have HIV or AIDS.  You use needles to inject street drugs.  You live with, or have sex with, someone who has hepatitis B.  You are a man who has sex with other men (MSM).  You get hemodialysis treatment.  You take certain medicines for conditions like cancer, organ transplantation, and autoimmune conditions.  Hepatitis C blood testing is recommended for all people born from [birth year] through 1963 and any individual with known risk factors for hepatitis C.  Healthy men should no longer receive prostate-specific antigen (PSA) blood tests as part of routine cancer screening. Talk to your health care provider about prostate cancer screening.  Testicular cancer screening is not recommended for adolescents or adult males who have no symptoms. Screening  includes self-exam, a health care provider exam, and other screening tests. Consult with your health care provider about any symptoms you have or any concerns you have about testicular cancer.  Practice safe sex. Use condoms and avoid high-risk sexual practices to reduce the spread of sexually transmitted infections (STIs).  You should be screened for STIs, including gonorrhea and chlamydia if:  You are sexually active and are younger than 24 years.  You are older than 24 years, and your health care provider tells you that you are at risk for this type of infection.  Your sexual activity has changed since you were last screened, and you are at an increased risk for chlamydia or gonorrhea. Ask your health care provider if you are at risk.  If you are at risk of being infected with HIV, it is recommended that you take a prescription medicine daily to prevent HIV infection. This is called pre-exposure prophylaxis (PrEP). You are considered at risk if:  You are a man who has sex with other men (MSM).  You are a heterosexual man who is  sexually active with multiple partners.  You take drugs by injection.  You are sexually active with a partner who has HIV.  Talk with your health care provider about whether you are at high risk of being infected with HIV. If you choose to begin PrEP, you should first be tested for HIV. You should then be tested every 3 months for as long as you are taking PrEP.  Use sunscreen. Apply sunscreen liberally and repeatedly throughout the day. You should seek shade when your shadow is shorter than you. Protect yourself by wearing long sleeves, pants, a wide-brimmed hat, and sunglasses year round whenever you are outdoors.  Tell your health care provider of new moles or changes in moles, especially if there is a change in shape or color. Also, tell your health care provider if a mole is larger than the size of a pencil eraser.  A one-time screening for abdominal aortic aneurysm (AAA) and surgical repair of large AAAs by ultrasound is recommended for men aged 34-75 years who are current or former smokers.  Stay current with your vaccines (immunizations).   This information is not intended to replace advice given to you by your health care provider. Make sure you discuss any questions you have with your health care provider.   Document Released: 07/10/2007 Document Revised: 02/01/2014 Document Reviewed: 06/08/2010 Elsevier Interactive Patient Education [employer].

## 2015-03-12 NOTE — Progress Notes (Signed)
BP 122/62 mmHg  Pulse 76  Temp(Src) 97.6 F (36.4 C) (Oral)  Wt 216 lb 12 oz (98.317 kg)   CC: medicare wellnes visit  Subjective:    Patient ID: [name], male    DOB: 22-Jun-1933, 82 y.o.   MRN: [id number]  HPI: [name] is a 82 y.o. male presenting on 03/12/2015 for Annual Exam   Lesion L cheek present for months. Scabbing, not healing. He does pick at it.   Diabetic Foot Exam - Simple   Simple Foot Form  Diabetic Foot exam was performed with the following findings:  Yes 03/12/2015 12:39 PM  Visual Inspection  See comments:  Yes  Sensation Testing  Intact to touch and monofilament testing bilaterally:  Yes  Pulse Check  Posterior Tibialis and Dorsalis pulse intact bilaterally:  Yes  Comments  Thickened onychomycotic great nails bilaterally, L 2nd nail Some scaling of soles       Hearing screen passed. Vision screen done at eye doctor recently. No falls in last year PHQ9 = 6.  Preventative: Colonoscopy 2008 - good for 10 yrs per records. Prostate screening - s/p prostatectomy for bladder cancer. Flu shot - yearly Pneumovax 2006, prevnar - 2016 Td 2004 zostavax - declines Advanced directives: completed living will at home - will bring copy.HCPOA would be oldest daughter Langley Gauss). Seat belt use discussed Sunscreen use discussed. No changing moles on skin  Daily caffeine use - 1-2 cups/day coffee Lives with wife.  7 children Occ: retired, was Administrator. Activity: no regular exercise Diet: good water, fruits/vegetables daily  Relevant past medical, surgical, family and social history reviewed and updated as indicated. Interim medical history since our last visit reviewed. Allergies and medications reviewed and updated. Current Outpatient Prescriptions on File Prior to Visit  Medication Sig  . acetaminophen (TYLENOL) 325 MG tablet Take 325 mg by mouth every 6 (six) hours as needed.  . ferrous sulfate (FERRO-BOB) 325 (65 FE) MG tablet Take 1 tablet  (325 mg total) by mouth daily with breakfast.  . furosemide (LASIX) 20 MG tablet take 1 tablet by mouth twice a day  . glucose blood (ONE TOUCH ULTRA TEST) test strip Use to test sugar once daily and as needed Dx: E11.8  . magnesium oxide (MAG-OX) 400 (241.3 Mg) MG tablet take 1 tablet by mouth once daily  . ONE TOUCH LANCETS MISC One daily and as needed/ ICD code 250.00   . Respiratory Therapy Supplies (NEBULIZER) DEVI by Does not apply route. Use as directed   . triamcinolone cream (KENALOG) 0.1 % Apply 1 application topically 2 (two) times daily. Apply to AA.   No current facility-administered medications on file prior to visit.    Review of Systems  Constitutional: Negative for fever, chills, activity change, appetite change, fatigue and unexpected weight change.  HENT: Negative for hearing loss.   Eyes: Negative for visual disturbance.  Respiratory: Negative for cough, chest tightness, shortness of breath and wheezing.   Cardiovascular: Negative for chest pain, palpitations and leg swelling.  Gastrointestinal: Negative for nausea, vomiting, abdominal pain, diarrhea, constipation, blood in stool and abdominal distention.  Genitourinary: Negative for hematuria and difficulty urinating.  Musculoskeletal: Negative for myalgias, arthralgias and neck pain.       Intermittent leg pains.   Skin: Negative for rash.  Neurological: Negative for dizziness, seizures, syncope and headaches.  Hematological: Negative for adenopathy. Does not bruise/bleed easily.  Psychiatric/Behavioral: Positive for dysphoric mood (mild, declines treatment). The patient is not nervous/anxious.  Per HPI unless specifically indicated in ROS section     Objective:    BP 122/62 mmHg  Pulse 76  Temp(Src) 97.6 F (36.4 C) (Oral)  Wt 216 lb 12 oz (98.317 kg)  Wt Readings from Last 3 Encounters:  03/12/15 216 lb 12 oz (98.317 kg)  09/23/14 216 lb 12 oz (98.317 kg)  09/09/14 216 lb (97.977 kg)    Physical Exam   Constitutional: He is oriented to person, place, and time. He appears well-developed and well-nourished. No distress.  HENT:  Head: Normocephalic and atraumatic.  Right Ear: Hearing, tympanic membrane, external ear and ear canal normal.  Left Ear: Hearing, tympanic membrane, external ear and ear canal normal.  Nose: Nose normal.  Mouth/Throat: Uvula is midline, oropharynx is clear and moist and mucous membranes are normal. No oropharyngeal exudate, posterior oropharyngeal edema or posterior oropharyngeal erythema.  Scabbed lesion L cheek 89mm diameter  Eyes: Conjunctivae and EOM are normal. Pupils are equal, round, and reactive to light. No scleral icterus.  Neck: Normal range of motion. Neck supple. Carotid bruit is not present. No thyromegaly present.  Cardiovascular: Normal rate, regular rhythm, normal heart sounds and intact distal pulses.   No murmur heard. Pulses:      Radial pulses are 2+ on the right side, and 2+ on the left side.  Pulmonary/Chest: Effort normal and breath sounds normal. No respiratory distress. He has no wheezes. He has no rales.  Abdominal: Soft. Bowel sounds are normal. He exhibits no distension and no mass. There is no tenderness. There is no rebound and no guarding.  Musculoskeletal: Normal range of motion. He exhibits no edema.  See HPI for foot exam 2+ DP bilaterally  Lymphadenopathy:    He has no cervical adenopathy.  Neurological: He is alert and oriented to person, place, and time.  CN grossly intact, station and gait intact Recall 3/3 Calculation 5/5 serial 3s  Skin: Skin is warm and dry. No rash noted.  Psychiatric: He has a normal mood and affect. His behavior is normal. Judgment and thought content normal.  Nursing note and vitals reviewed.  Results for orders placed or performed in visit on 03/05/15  Lipid panel  Result Value Ref Range   Cholesterol 104 0 - 200 mg/dL   Triglycerides 96.0 0.0 - 149.0 mg/dL   HDL 31.80 (L) >39.00 mg/dL   VLDL  19.2 0.0 - 40.0 mg/dL   LDL Cholesterol 53 0 - 99 mg/dL   Total CHOL/HDL Ratio 3    NonHDL 72.62   Comprehensive metabolic panel  Result Value Ref Range   Sodium 147 (H) 135 - 145 mEq/L   Potassium 4.3 3.5 - 5.1 mEq/L   Chloride 109 96 - 112 mEq/L   CO2 32 19 - 32 mEq/L   Glucose, Bld 98 70 - 99 mg/dL   BUN 14 6 - 23 mg/dL   Creatinine, Ser 1.17 0.40 - 1.50 mg/dL   Total Bilirubin 0.8 0.2 - 1.2 mg/dL   Alkaline Phosphatase 99 39 - 117 U/L   AST 27 0 - 37 U/L   ALT 14 0 - 53 U/L   Total Protein 6.7 6.0 - 8.3 g/dL   Albumin 3.8 3.5 - 5.2 g/dL   Calcium 9.2 8.4 - 10.5 mg/dL   GFR 63.80 >60.00 mL/min  Hemoglobin A1c  Result Value Ref Range   Hgb A1c MFr Bld 5.9 4.6 - 6.5 %  CBC with Differential/Platelet  Result Value Ref Range   WBC 4.0 4.0 -  10.5 K/uL   RBC 4.07 (L) 4.22 - 5.81 Mil/uL   Hemoglobin 12.3 (L) 13.0 - 17.0 g/dL   HCT 37.0 (L) 39.0 - 52.0 %   MCV 90.7 78.0 - 100.0 fl   MCHC 33.4 30.0 - 36.0 g/dL   RDW 15.5 11.5 - 15.5 %   Platelets 101.0 (L) 150.0 - 400.0 K/uL   Neutrophils Relative % 55.5 43.0 - 77.0 %   Lymphocytes Relative 29.8 12.0 - 46.0 %   Monocytes Relative 8.9 3.0 - 12.0 %   Eosinophils Relative 5.1 (H) 0.0 - 5.0 %   Basophils Relative 0.7 0.0 - 3.0 %   Neutro Abs 2.2 1.4 - 7.7 K/uL   Lymphs Abs 1.2 0.7 - 4.0 K/uL   Monocytes Absolute 0.4 0.1 - 1.0 K/uL   Eosinophils Absolute 0.2 0.0 - 0.7 K/uL   Basophils Absolute 0.0 0.0 - 0.1 K/uL  Vitamin B12  Result Value Ref Range   Vitamin B-12 1437 (H) 211 - 911 pg/mL  Ferritin  Result Value Ref Range   Ferritin 59.5 22.0 - 322.0 ng/mL  IBC panel  Result Value Ref Range   Iron 54 42 - 165 ug/dL   Transferrin 266.0 212.0 - 360.0 mg/dL   Saturation Ratios 14.5 (L) 20.0 - 50.0 %  TSH  Result Value Ref Range   TSH 4.04 0.35 - 4.50 uIU/mL  Microalbumin / creatinine urine ratio  Result Value Ref Range   Microalb, Ur 14.2 (H) 0.0 - 1.9 mg/dL   Creatinine,U 105.8 mg/dL   Microalb Creat Ratio 13.4 0.0 -  30.0 mg/g  Lactate Dehydrogenase  Result Value Ref Range   LDH 169 94 - 250 U/L  Fructosamine  Result Value Ref Range   Fructosamine 296 (H) 190 - 270 umol/L      Assessment & Plan:   Problem List Items Addressed This Visit    Presence of urostomy ([place])    Site clean, dry, intact.      Neoplasm of uncertain behavior of skin    Left cheek - present for last several months. Poorly healing but he has been picking at this.  rec treat with vaseline or triple abx ointment and bandage x 2 wks, if no improvement rec derm referral - pt agrees and would like to go to [facility] if needed.      Medicare annual wellness visit, subsequent - Primary    I have personally reviewed the Medicare Annual Wellness questionnaire and have noted 1. The patient's medical and social history 2. Their use of alcohol, tobacco or illicit drugs 3. Their current medications and supplements 4. The patient's functional ability including ADL's, fall risks, home safety risks and hearing or visual impairment. Cognitive function has been assessed and addressed as indicated.  5. Diet and physical activity 6. Evidence for depression or mood disorders The patients weight, height, BMI have been recorded in the chart. I have made referrals, counseling and provided education to the patient based on review of the above and I have provided the pt with a written personalized care plan for preventive services. Provider list updated.. See scanned questionairre as needed for further documentation. Reviewed preventative protocols and updated unless pt declined.       Iron deficiency anemia    Continue 1 tab ferrous sulfate daily.      Relevant Medications   vitamin B-12 (CYANOCOBALAMIN) 500 MCG tablet   HLD (hyperlipidemia)    Chronic, great control on simvastatin 20mg  daily. Discussed would be ok  to decrease dose to 10mg  daily - but to check with GI first.      Relevant Medications   cloNIDine (CATAPRES) 0.2 MG  tablet   doxazosin (CARDURA) 8 MG tablet   losartan (COZAAR) 100 MG tablet   simvastatin (ZOCOR) 20 MG tablet   nadolol (CORGARD) 40 MG tablet   Health maintenance examination    Preventative protocols reviewed and updated unless pt declined. Discussed healthy diet and lifestyle.       Gastric varices    Continue nadolol.       Relevant Medications   cloNIDine (CATAPRES) 0.2 MG tablet   doxazosin (CARDURA) 8 MG tablet   losartan (COZAAR) 100 MG tablet   simvastatin (ZOCOR) 20 MG tablet   nadolol (CORGARD) 40 MG tablet   Gastric and duodenal angiodysplasia    Sees GI. Avoid anticoagulation      Relevant Medications   cloNIDine (CATAPRES) 0.2 MG tablet   doxazosin (CARDURA) 8 MG tablet   losartan (COZAAR) 100 MG tablet   simvastatin (ZOCOR) 20 MG tablet   nadolol (CORGARD) 40 MG tablet   Essential hypertension    Chronic, stable. Continue current regimen.      Relevant Medications   cloNIDine (CATAPRES) 0.2 MG tablet   doxazosin (CARDURA) 8 MG tablet   losartan (COZAAR) 100 MG tablet   simvastatin (ZOCOR) 20 MG tablet   nadolol (CORGARD) 40 MG tablet   Diastolic CHF (HCC)    No recent issue. euvolemic today and blood pressure well controlled. Continue furosemide and losartan.       Relevant Medications   cloNIDine (CATAPRES) 0.2 MG tablet   doxazosin (CARDURA) 8 MG tablet   losartan (COZAAR) 100 MG tablet   simvastatin (ZOCOR) 20 MG tablet   nadolol (CORGARD) 40 MG tablet   Controlled diabetes mellitus type 2 with complications ([place])    Anticipate falsely low A1c given chronic anemia and liver disease. Fructosamine level correlates with A1c of 7.5%. Still adequate with goal A1c <8%. Continue metformin 250mg  daily.      Relevant Medications   losartan (COZAAR) 100 MG tablet   metFORMIN (GLUCOPHAGE) 500 MG tablet   simvastatin (ZOCOR) 20 MG tablet   Cirrhosis, cryptogenic ([place])    Appreciate GI care of patient.       Atrial fibrillation ([place])    Sounds  regular today. Not anticoagulant candidate.       Relevant Medications   cloNIDine (CATAPRES) 0.2 MG tablet   doxazosin (CARDURA) 8 MG tablet   losartan (COZAAR) 100 MG tablet   simvastatin (ZOCOR) 20 MG tablet   nadolol (CORGARD) 40 MG tablet   Anemia, chronic disease    Mild. Continue to monitor. B12 has been repleted. Continues iron daily      Relevant Medications   vitamin B-12 (CYANOCOBALAMIN) 500 MCG tablet   Advanced care planning/counseling discussion    Advanced directives: completed living will at home - will bring copy.HCPOA would be oldest daughter Langley Gauss).          Follow up plan: Return in about 6 months (around 09/09/2015), or as needed, for follow up visit.

## 2015-03-12 NOTE — Assessment & Plan Note (Signed)
Appreciate GI care of patient. 

## 2015-03-12 NOTE — Assessment & Plan Note (Signed)
Sees GI. Avoid anticoagulation

## 2015-03-12 NOTE — Progress Notes (Signed)
Pre visit review using our clinic review tool, if applicable. No additional management support is needed unless otherwise documented below in the visit note. 

## 2015-03-12 NOTE — Assessment & Plan Note (Signed)
Left cheek - present for last several months. Poorly healing but he has been picking at this.  rec treat with vaseline or triple abx ointment and bandage x 2 wks, if no improvement rec derm referral - pt agrees and would like to go to [facility] if needed.

## 2015-03-12 NOTE — Assessment & Plan Note (Signed)
Continue 1 tab ferrous sulfate daily.

## 2015-03-12 NOTE — Assessment & Plan Note (Signed)
Continue nadolol. 

## 2015-03-12 NOTE — Assessment & Plan Note (Signed)

## 2015-03-12 NOTE — Assessment & Plan Note (Signed)
Preventative protocols reviewed and updated unless pt declined. Discussed healthy diet and lifestyle.  

## 2015-03-12 NOTE — Assessment & Plan Note (Signed)
Chronic, great control on simvastatin 20mg  daily. Discussed would be ok to decrease dose to 10mg  daily - but to check with GI first.

## 2015-03-12 NOTE — Assessment & Plan Note (Signed)
No recent issue. euvolemic today and blood pressure well controlled. Continue furosemide and losartan.

## 2015-03-12 NOTE — Assessment & Plan Note (Addendum)
Anticipate falsely low A1c given chronic anemia and liver disease. Fructosamine level correlates with A1c of 7.5%. Still adequate with goal A1c <8%. Continue metformin 250mg  daily.

## 2015-03-12 NOTE — Assessment & Plan Note (Signed)
Mild. Continue to monitor. B12 has been repleted. Continues iron daily

## 2015-03-12 NOTE — Assessment & Plan Note (Signed)
Site clean, dry, intact.

## 2015-03-12 NOTE — Assessment & Plan Note (Signed)
Advanced directives: completed living will at home - will bring copy.HCPOA would be oldest daughter Langley Gauss).

## 2015-03-25 ENCOUNTER — Ambulatory Visit (INDEPENDENT_AMBULATORY_CARE_PROVIDER_SITE_OTHER): Payer: Medicare Other | Admitting: Cardiovascular Disease

## 2015-03-25 ENCOUNTER — Encounter: Payer: Self-pay | Admitting: Cardiovascular Disease

## 2015-03-25 VITALS — BP 155/64 | HR 63 | Ht 68.0 in | Wt 215.8 lb

## 2015-03-25 DIAGNOSIS — E785 Hyperlipidemia, unspecified: Secondary | ICD-10-CM

## 2015-03-25 DIAGNOSIS — I4891 Unspecified atrial fibrillation: Secondary | ICD-10-CM | POA: Diagnosis not present

## 2015-03-25 DIAGNOSIS — I10 Essential (primary) hypertension: Secondary | ICD-10-CM | POA: Diagnosis not present

## 2015-03-25 DIAGNOSIS — I5032 Chronic diastolic (congestive) heart failure: Secondary | ICD-10-CM

## 2015-03-25 NOTE — Assessment & Plan Note (Signed)
Blood pressure initially elevated on today's visit, improved on recheck Encouraged him to monitor his pressure at home and call our office if this continues to run high

## 2015-03-25 NOTE — Assessment & Plan Note (Signed)
Appears relatively euvolemic on today's visit Continues to take Lasix twice a day No other medication changes made

## 2015-03-25 NOTE — Assessment & Plan Note (Signed)
Cholesterol 104, potentially could decrease the simvastatin in half This was discussed with Mr. [name]

## 2015-03-25 NOTE — Patient Instructions (Signed)
You are doing well. No medication changes were made.  Talk with Dr. Darnell Level about restless leg, maybe a pill like neurontin/gabapentin  Please call us if you have new issues that need to be addressed before your next appt.  Your physician wants you to follow-up in: 6 months.  You will receive a reminder letter in the mail two months in advance. If you don't receive a letter, please call our office to schedule the follow-up appointment.

## 2015-03-25 NOTE — Assessment & Plan Note (Signed)
Long discussion again today concerning anticoagulation No recent bleeding in the past several years Anticoagulation held in the past given his high risk. Patient was also reluctant to take anticoagulation in the past. This was again discussed with him and his daughter, risk and benefit. Daughter reports they are due to schedule an appointment with Dr. Fuller Plan. They will address this again with him. My suspicion is he will be high risk given gastric varices and AV malformations

## 2015-03-25 NOTE — Progress Notes (Signed)
Patient ID: [name], male    DOB: [birth year], 82 y.o.   MRN: [id number]  HPI Comments: [name] is a very pleasant 82 yo gentleman with a long history of smoking, suspected COPD, no known coronary artery disease,    mid May 2012 with respiratory distress, right lower lobe pneumonia, new atrial fibrillation,  admission to [facility] in early 2013 for right upper lobe pneumonia. GI bleed June 2013, followed by [name] for gastric varices, AV malformations on upper GI,  Admission 09/12/2011 for COPD exacerbation. He presents for routine followup today of his atrial fibrillation  In follow-up, he reports that he is doing well. He is sedentary, legs are getting weaker Biggest complaint is leg pain at nighttime, could be one leg, then the other, has to get out of bed and move his legs to make the pain go away Does not feel it as a cramping, more of a nerve pain  No significant shortness of breath, leg swelling.  He is currently not on anticoagulation secondary to history of GI bleeding Denies any recent GI bleeding Was seen by GI February 2016 and February 2015 In the past it was felt he was high risk of bleeding given gastric varices and AV malformations  Lab work reviewed with him showing total cholesterol 104, LDL 53, hemoglobin A1c 5.9  EKG today showing atrial fibrillation with rate of 63 bpm, nonspecific T-wave abnormality  Other past medical history  episode of atrial fibrillation in December 2013.  He did not restart his anticoagulation as it was very expensive and he was in the donut hole. He is also concerned about recurrent GI bleed. Previous leg edema on amlodipine  Previous hospital admission June 27 2011  with GI bleed.  Upper endoscopy Showed angiodysplasias with ablation, gastric varices. He was started on proton pump inhibitor. He was also treated for C. difficile . Hematocrit reached  low 20s on admission.  his anticoagulation was held at discharge . His dose of Coreg was also  significantly decreased at discharge from 25 mg to 6.25 mg twice a day.  Echocardiogram May 2013 shows normal LV systolic function, greater than 55%, mildly dilated left atrium, no diastolic dysfunction   No Known Allergies  Outpatient Encounter Prescriptions as of 03/25/2015  Medication Sig  . acetaminophen (TYLENOL) 325 MG tablet Take 325 mg by mouth every 6 (six) hours as needed.  . cloNIDine (CATAPRES) 0.2 MG tablet Take 1 tablet (0.2 mg total) by mouth 2 (two) times daily.  Marland Kitchen doxazosin (CARDURA) 8 MG tablet Take 0.5 tablets (4 mg total) by mouth 2 (two) times daily.  . ferrous sulfate (FERRO-BOB) 325 (65 FE) MG tablet Take 1 tablet (325 mg total) by mouth daily with breakfast.  . furosemide (LASIX) 20 MG tablet take 1 tablet by mouth twice a day  . glucose blood (ONE TOUCH ULTRA TEST) test strip Use to test sugar once daily and as needed Dx: E11.8  . losartan (COZAAR) 100 MG tablet Take 1 tablet (100 mg total) by mouth daily.  . magnesium oxide (MAG-OX) 400 (241.3 Mg) MG tablet take 1 tablet by mouth once daily  . metFORMIN (GLUCOPHAGE) 500 MG tablet TAKE 1/2 TABLET BY MOUTH EVERY DAY WITH BREAKFAST  . nadolol (CORGARD) 40 MG tablet Take 1 tablet (40 mg total) by mouth daily.  . ONE TOUCH LANCETS MISC One daily and as needed/ ICD code 250.00   . pantoprazole (PROTONIX) 40 MG tablet Take 1 tablet (40 mg total) by  mouth daily.  Marland Kitchen Respiratory Therapy Supplies (NEBULIZER) DEVI by Does not apply route. Use as directed   . simvastatin (ZOCOR) 20 MG tablet Take 1 tablet (20 mg total) by mouth daily.  Marland Kitchen triamcinolone cream (KENALOG) 0.1 % Apply 1 application topically 2 (two) times daily. Apply to AA.  . vitamin B-12 (CYANOCOBALAMIN) 500 MCG tablet Take 500 mcg by mouth daily.   No facility-administered encounter medications on file as of 03/25/2015.    Past Medical History  Diagnosis Date  . Diabetes mellitus, type 2 ([place])   . Hyperlipidemia   . Hypertension   . Cryptogenic cirrhosis  ([place])     Followed by Dr Fuller [name], never biopsied, has been stable.  History of gastric varices.  . Pyloric stenosis     mild EGD 10/26/06  . Inguinal hernia     left  . Iron deficiency anemia   . Hemorrhoids   . B12 deficiency   . COPD (chronic obstructive pulmonary disease) ([place])     ([name])  . History of ileostomy ([name])   . History of pneumonia 2012  . Shortness of breath   . Chronic a-fib ([place])     on pradaxa, declined cardioversion in past  . History of bladder cancer 1984    s/p ileal conduit  . GI bleed   . CHF (congestive heart failure) ([facility])     diastolic. Echo 5/10 with mild LVH, EF [id number], grade 1 diastolic dysfunction, severe left atrial enlargement, aortic sclerosis  . Gastric varices   . Arthritis   . Pinched nerve     back  . C. difficile colitis 07/06/2011  . Gastric and duodenal angiodysplasia 06/28/2011  . RLL pneumonia 01/28/2014  . Atrial fibrillation ([place]) 06/23/2010    Past Surgical History  Procedure Laterality Date  . Ileostomy  1984    bladder cancer  . Prostatectomy  1984    bladder cancer  . Penile prosthesis implant  1990    inflatable implant  . US echocardiography  05/1995    EF 60% TR, AI  . Colonoscopy  07/24/2003    Int. hemorrhoids  . Be  08/08/2003  . Colonoscopy  10/26/2006    Int hemorrhoids, 10 yrs  . Esophagogastroduodenoscopy  10/26/2006    Gastric varices, pyloric stenosis  . Bladder removal      s/p urostomy bag  . Esophagogastroduodenoscopy  07/01/2011    Procedure: ESOPHAGOGASTRODUODENOSCOPY (EGD);  Surgeon: Michael Bears, MD;  Location: [facility];  Service: Gastroenterology;  Laterality: N/A;  . Esophagogastroduodenoscopy  06/28/2011    Procedure: ESOPHAGOGASTRODUODENOSCOPY (EGD);  Surgeon: Michael Bears, MD;  Location: [facility];  Service: Gastroenterology;  Laterality: N/A;  . Fetal blood transfusion      Social History  reports that he quit smoking about 25 years ago. His smoking use included Cigarettes. He has a 45  pack-year smoking history. He has never used smokeless tobacco. He reports that he does not drink alcohol or use illicit drugs.  Family History family history includes Coronary artery disease in his sister and sister; Diabetes in his brother; Heart disease in his brother and father; Hypertension in his mother; Osteoarthritis in his mother; Stroke in his other. There is no history of Colon cancer.   Review of Systems  Constitutional: Negative.   Respiratory: Negative.   Cardiovascular: Negative.   Gastrointestinal: Negative.   Musculoskeletal: Positive for gait problem.       Shooting nerve pain in his legs at night  Neurological: Negative.  Hematological: Negative.   Psychiatric/Behavioral: Negative.   All other systems reviewed and are negative. BP 155/64 mmHg  Pulse 63  Ht 5\' 8"  (1.727 m)  Wt 215 lb 12 oz (97.864 kg)  BMI 32.81 kg/m2  Physical Exam  Constitutional: He is oriented to person, place, and time. He appears well-developed and well-nourished.  Obese  HENT:  Head: Normocephalic.  Nose: Nose normal.  Mouth/Throat: Oropharynx is clear and moist.  Eyes: Conjunctivae are normal. Pupils are equal, round, and reactive to light.  Neck: Normal range of motion. Neck supple. No JVD present.  Cardiovascular: Normal rate, S1 normal, S2 normal, normal heart sounds and intact distal pulses.  An irregularly irregular rhythm present. Exam reveals no gallop and no friction rub.   No murmur heard. Pulmonary/Chest: Effort normal. No respiratory distress. He exhibits no tenderness.  Abdominal: Soft. Bowel sounds are normal. He exhibits no distension. There is no tenderness.  Musculoskeletal: Normal range of motion. He exhibits no edema or tenderness.  Lymphadenopathy:    He has no cervical adenopathy.  Neurological: He is alert and oriented to person, place, and time. Coordination normal.  Skin: Skin is warm and dry. No rash noted. No erythema.  Psychiatric: He has a normal mood and  affect. His behavior is normal. Judgment and thought content normal.      Assessment and [name]   Nursing note and vitals reviewed.

## 2015-07-19 ENCOUNTER — Other Ambulatory Visit: Payer: Self-pay | Admitting: Cardiovascular Disease

## 2015-07-23 ENCOUNTER — Other Ambulatory Visit: Payer: Self-pay | Admitting: Family Medicine

## 2015-09-09 ENCOUNTER — Ambulatory Visit (INDEPENDENT_AMBULATORY_CARE_PROVIDER_SITE_OTHER): Payer: Medicare Other | Admitting: Family Medicine

## 2015-09-09 ENCOUNTER — Encounter: Payer: Self-pay | Admitting: Family Medicine

## 2015-09-09 VITALS — BP 140/70 | HR 71 | Wt 216.8 lb

## 2015-09-09 DIAGNOSIS — E118 Type 2 diabetes mellitus with unspecified complications: Secondary | ICD-10-CM

## 2015-09-09 DIAGNOSIS — R531 Weakness: Secondary | ICD-10-CM

## 2015-09-09 DIAGNOSIS — K7469 Other cirrhosis of liver: Secondary | ICD-10-CM | POA: Diagnosis not present

## 2015-09-09 DIAGNOSIS — D638 Anemia in other chronic diseases classified elsewhere: Secondary | ICD-10-CM

## 2015-09-09 DIAGNOSIS — I5032 Chronic diastolic (congestive) heart failure: Secondary | ICD-10-CM | POA: Diagnosis not present

## 2015-09-09 DIAGNOSIS — K31819 Angiodysplasia of stomach and duodenum without bleeding: Secondary | ICD-10-CM

## 2015-09-09 DIAGNOSIS — R0609 Other forms of dyspnea: Secondary | ICD-10-CM

## 2015-09-09 DIAGNOSIS — J449 Chronic obstructive pulmonary disease, unspecified: Secondary | ICD-10-CM

## 2015-09-09 DIAGNOSIS — I10 Essential (primary) hypertension: Secondary | ICD-10-CM

## 2015-09-09 MED ORDER — UMECLIDINIUM-VILANTEROL 62.5-25 MCG/INH IN AEPB: 1.0000 | INHALATION_SPRAY | Freq: Every day | RESPIRATORY_TRACT | 1 refills | Status: DC

## 2015-09-09 NOTE — Assessment & Plan Note (Signed)
Worsened pedal edema with bibasilar crackles - concern for acute CHF exacerbation. Check BNP, increase furosemide to 40mg  BID x 5 days then return to 20mg  BID dosing.

## 2015-09-09 NOTE — Assessment & Plan Note (Signed)
Patient has noticed progressive decline since last visit here, more dyspneic, more weak. Finding needing more assistance with ADLs at home. Will refer to home health for further evaluation.

## 2015-09-09 NOTE — Assessment & Plan Note (Signed)
Chronic, stable. Continue current regimen. 

## 2015-09-09 NOTE — Patient Instructions (Addendum)
Increase lasix to 2 tablets (40mg ) twice daily for 5 days then back down to 1 tablet twice daily. We will refer you to home health for respiratory therapy and nursing evaluation.  Labs today.  Return in 3 months for follow up visit.

## 2015-09-09 NOTE — Progress Notes (Signed)
BP 140/70   Pulse 71   Wt 216 lb 12.8 oz (98.3 kg)   SpO2 90%   BMI 32.96 kg/m    CC: 83mo f/u visit Subjective:    Patient ID: [name], male    DOB: [birth year], 82 y.o.   MRN: [id number]  HPI: [name] is a 82 y.o. male presenting on 09/09/2015 for Follow-up and Breathing Problem (SOB)   Worsening dyspnea in COPD, HFpEF, cryptogenic cirrhosis and diabetes - has seen Dr Raul Del. On nocturnal oxygen 2L, for last 1.5 wks has increased to 3L [place]. Started on anoro ellipta - has not filled this. Combivent respimat was filled by pharmacy instead. More fatigued. More generalized weakness noted.   Denies chest pain, tightness, coughing, headaches, dizziness, pedal edema.   Daughter who is registered home care nurse has been caring for patient.  Requests more assistance at home. Wife has chronic illnesses as well.   Relevant past medical, surgical, family and social history reviewed and updated as indicated. Interim medical history since our last visit reviewed. Allergies and medications reviewed and updated. Current Outpatient Prescriptions on File Prior to Visit  Medication Sig  . acetaminophen (TYLENOL) 325 MG tablet Take 325 mg by mouth every 6 (six) hours as needed.  . cloNIDine (CATAPRES) 0.2 MG tablet Take 1 tablet (0.2 mg total) by mouth 2 (two) times daily.  Marland Kitchen doxazosin (CARDURA) 8 MG tablet Take 0.5 tablets (4 mg total) by mouth 2 (two) times daily.  . ferrous sulfate (FERRO-BOB) 325 (65 FE) MG tablet Take 1 tablet (325 mg total) by mouth daily with breakfast.  . furosemide (LASIX) 20 MG tablet take 1 tablet by mouth twice a day  . glucose blood (ONE TOUCH ULTRA TEST) test strip Use to test sugar once daily and as needed Dx: E11.8  . losartan (COZAAR) 100 MG tablet Take 1 tablet (100 mg total) by mouth daily.  . Magnesium Oxide 400 (240 Mg) MG TABS take 1 tablet by mouth once daily  . metFORMIN (GLUCOPHAGE) 500 MG tablet TAKE 1/2 TABLET BY MOUTH EVERY DAY WITH BREAKFAST  .  nadolol (CORGARD) 40 MG tablet Take 1 tablet (40 mg total) by mouth daily.  . ONE TOUCH LANCETS MISC One daily and as needed/ ICD code 250.00   . pantoprazole (PROTONIX) 40 MG tablet Take 1 tablet (40 mg total) by mouth daily.  Marland Kitchen Respiratory Therapy Supplies (NEBULIZER) DEVI by Does not apply route. Use as directed   . simvastatin (ZOCOR) 20 MG tablet Take 1 tablet (20 mg total) by mouth daily.  Marland Kitchen triamcinolone cream (KENALOG) 0.1 % Apply 1 application topically 2 (two) times daily. Apply to AA.  . vitamin B-12 (CYANOCOBALAMIN) 500 MCG tablet Take 500 mcg by mouth daily.   No current facility-administered medications on file prior to visit.     Review of Systems Per HPI unless specifically indicated in ROS section     Objective:    BP 140/70   Pulse 71   Wt 216 lb 12.8 oz (98.3 kg)   SpO2 90%   BMI 32.96 kg/m   Wt Readings from Last 3 Encounters:  09/09/15 216 lb 12.8 oz (98.3 kg)  03/25/15 215 lb 12 oz (97.9 kg)  03/12/15 216 lb 12 oz (98.3 kg)    Physical Exam  Constitutional: He appears well-developed and well-nourished. No distress.  HENT:  Mouth/Throat: Oropharynx is clear and moist. No oropharyngeal exudate.  Cardiovascular: Normal rate, normal heart sounds and intact distal pulses.  An irregular rhythm present.  No murmur heard. Pulmonary/Chest: Effort normal and breath sounds normal. No respiratory distress. He has no wheezes. He has no rales.  Bibasilar crackles  Musculoskeletal: He exhibits edema (1+).  Skin: Skin is warm and dry. No rash noted.  Nursing note and vitals reviewed.  Results for orders placed or performed in visit on 03/05/15  Lipid panel  Result Value Ref Range   Cholesterol 104 0 - 200 mg/dL   Triglycerides 96.0 0.0 - 149.0 mg/dL   HDL 31.80 (L) >39.00 mg/dL   VLDL 19.2 0.0 - 40.0 mg/dL   LDL Cholesterol 53 0 - 99 mg/dL   Total CHOL/HDL Ratio 3    NonHDL 72.62   Comprehensive metabolic panel  Result Value Ref Range   Sodium 147 (H) 135 -  145 mEq/L   Potassium 4.3 3.5 - 5.1 mEq/L   Chloride 109 96 - 112 mEq/L   CO2 32 19 - 32 mEq/L   Glucose, Bld 98 70 - 99 mg/dL   BUN 14 6 - 23 mg/dL   Creatinine, Ser 1.17 0.40 - 1.50 mg/dL   Total Bilirubin 0.8 0.2 - 1.2 mg/dL   Alkaline Phosphatase 99 39 - 117 U/L   AST 27 0 - 37 U/L   ALT 14 0 - 53 U/L   Total Protein 6.7 6.0 - 8.3 g/dL   Albumin 3.8 3.5 - 5.2 g/dL   Calcium 9.2 8.4 - 10.5 mg/dL   GFR 63.80 >60.00 mL/min  Hemoglobin A1c  Result Value Ref Range   Hgb A1c MFr Bld 5.9 4.6 - 6.5 %  CBC with Differential/Platelet  Result Value Ref Range   WBC 4.0 4.0 - 10.5 K/uL   RBC 4.07 (L) 4.22 - 5.81 Mil/uL   Hemoglobin 12.3 (L) 13.0 - 17.0 g/dL   HCT 37.0 (L) 39.0 - 52.0 %   MCV 90.7 78.0 - 100.0 fl   MCHC 33.4 30.0 - 36.0 g/dL   RDW 15.5 11.5 - 15.5 %   Platelets 101.0 (L) 150.0 - 400.0 K/uL   Neutrophils Relative % 55.5 43.0 - 77.0 %   Lymphocytes Relative 29.8 12.0 - 46.0 %   Monocytes Relative 8.9 3.0 - 12.0 %   Eosinophils Relative 5.1 (H) 0.0 - 5.0 %   Basophils Relative 0.7 0.0 - 3.0 %   Neutro Abs 2.2 1.4 - 7.7 K/uL   Lymphs Abs 1.2 0.7 - 4.0 K/uL   Monocytes Absolute 0.4 0.1 - 1.0 K/uL   Eosinophils Absolute 0.2 0.0 - 0.7 K/uL   Basophils Absolute 0.0 0.0 - 0.1 K/uL  Vitamin B12  Result Value Ref Range   Vitamin B-12 1,437 (H) 211 - 911 pg/mL  Ferritin  Result Value Ref Range   Ferritin 59.5 22.0 - 322.0 ng/mL  IBC panel  Result Value Ref Range   Iron 54 42 - 165 ug/dL   Transferrin 266.0 212.0 - 360.0 mg/dL   Saturation Ratios 14.5 (L) 20.0 - 50.0 %  TSH  Result Value Ref Range   TSH 4.04 0.35 - 4.50 uIU/mL  Microalbumin / creatinine urine ratio  Result Value Ref Range   Microalb, Ur 14.2 (H) 0.0 - 1.9 mg/dL   Creatinine,U 105.8 mg/dL   Microalb Creat Ratio 13.4 0.0 - 30.0 mg/g  Lactate Dehydrogenase  Result Value Ref Range   LDH 169 94 - 250 U/L  Fructosamine  Result Value Ref Range   Fructosamine 296 (H) 190 - 270 umol/L      Assessment  &  Plan:  Over 25 minutes were spent face-to-face with the patient during this encounter and >50% of that time was spent on counseling and coordination of care  Problem List Items Addressed This Visit    Anemia, chronic disease    Update CBC today.       Relevant Orders   CBC with Differential/Platelet   Cirrhosis, cryptogenic ([place])    Encouraged f/u with GI as overdue. Check CMP, CBC today.       Relevant Orders   Comprehensive metabolic panel   Controlled diabetes mellitus type 2 with complications ([place]) - Primary    Update A1c/fructosamine. Will follow fructosamine due to known anemia of chronic disease.       Relevant Orders   Hemoglobin A1c   Fructosamine   COPD - on 2-3 L O2 at night    Per latest pulm note, pt should be on portable oxygen due to worsening exertional desaturations. This was not set up. He was also prescribed anoro ellipta but this was not filled. I have refilled anoro ellipta and will refer to home health for assistance with daytime portable O2 PRN exertion.      Relevant Medications   umeclidinium-vilanterol (ANORO ELLIPTA) 62.5-25 MCG/INH AEPB   Other Relevant Orders   Ambulatory referral to [place]   Diastolic CHF ([place])    Worsened pedal edema with bibasilar crackles - concern for acute CHF exacerbation. Check BNP, increase furosemide to 40mg  BID x 5 days then return to 20mg  BID dosing.       Relevant Orders   Brain natriuretic peptide   Essential hypertension    Chronic, stable. Continue current regimen.       Gastric and duodenal angiodysplasia    Encouraged f/u with GI - overdue.       Generalized weakness    Patient has noticed progressive decline since last visit here, more dyspneic, more weak. Finding needing more assistance with ADLs at home. Will refer to home health for further evaluation.      Relevant Orders   Ambulatory referral to [place]    Other Visit Diagnoses    Dyspnea on exertion       Relevant Orders    Ambulatory referral to [place]       Follow up plan: Return in about 3 months (around 12/10/2015), or as needed, for follow up visit.  Ria Bush, MD

## 2015-09-09 NOTE — Assessment & Plan Note (Signed)
Update CBC today. 

## 2015-09-09 NOTE — Progress Notes (Signed)
Pre visit review using our clinic review tool, if applicable. No additional management support is needed unless otherwise documented below in the visit note. 

## 2015-09-09 NOTE — Assessment & Plan Note (Signed)
Update A1c/fructosamine. Will follow fructosamine due to known anemia of chronic disease.

## 2015-09-09 NOTE — Assessment & Plan Note (Addendum)
Per latest pulm note, pt should be on portable oxygen due to worsening exertional desaturations. This was not set up. He was also prescribed anoro ellipta but this was not filled. I have refilled anoro ellipta and will refer to home health for assistance with daytime portable O2 PRN exertion.

## 2015-09-09 NOTE — Assessment & Plan Note (Signed)
Encouraged f/u with GI - overdue.

## 2015-09-09 NOTE — Assessment & Plan Note (Signed)
Encouraged f/u with GI as overdue. Check CMP, CBC today.

## 2015-09-10 LAB — COMPREHENSIVE METABOLIC PANEL
ALBUMIN: 4.1 g/dL (ref 3.5–5.2)
ALT: 13 U/L (ref 0–53)
AST: 27 U/L (ref 0–37)
Alkaline Phosphatase: 105 U/L (ref 39–117)
BUN: 15 mg/dL (ref 6–23)
CHLORIDE: 105 meq/L (ref 96–112)
CO2: 32 mEq/L (ref 19–32)
CREATININE: 1.18 mg/dL (ref 0.40–1.50)
Calcium: 9.3 mg/dL (ref 8.4–10.5)
GFR: 63.09 mL/min (ref >60.00)
GLUCOSE: 79 mg/dL (ref 70–99)
POTASSIUM: 4.2 meq/L (ref 3.5–5.1)
SODIUM: 143 meq/L (ref 135–145)
TOTAL PROTEIN: 7.1 g/dL (ref 6.0–8.3)
Total Bilirubin: 0.8 mg/dL (ref 0.2–1.2)

## 2015-09-10 LAB — CBC WITH DIFFERENTIAL/PLATELET
BASOS PCT: 0.4 % (ref 0.0–3.0)
Basophils Absolute: 0 10*3/uL (ref 0.0–0.1)
EOS ABS: 0.2 10*3/uL (ref 0.0–0.7)
EOS PCT: 5.8 % — AB (ref 0.0–5.0)
HEMATOCRIT: 32 % — AB (ref 39.0–52.0)
Hemoglobin: 10.7 g/dL — ABNORMAL LOW (ref 13.0–17.0)
LYMPHS ABS: 0.9 10*3/uL (ref 0.7–4.0)
Lymphocytes Relative: 27.9 % (ref 12.0–46.0)
MCHC: 33.5 g/dL (ref 30.0–36.0)
MCV: 90 fl (ref 78.0–100.0)
MONO ABS: 0.3 10*3/uL (ref 0.1–1.0)
Monocytes Relative: 8.1 % (ref 3.0–12.0)
Neutro Abs: 1.9 10*3/uL (ref 1.4–7.7)
Neutrophils Relative %: 57.8 % (ref 43.0–77.0)
Platelets: 101 10*3/uL — ABNORMAL LOW (ref 150.0–400.0)
RBC: 3.55 Mil/uL — AB (ref 4.22–5.81)
RDW: 15.6 % — ABNORMAL HIGH (ref 11.5–15.5)
WBC: 3.3 10*3/uL — AB (ref 4.0–10.5)

## 2015-09-10 LAB — HEMOGLOBIN A1C: Hgb A1c MFr Bld: 5.8 % (ref 4.6–6.5)

## 2015-09-10 LAB — BRAIN NATRIURETIC PEPTIDE: Pro B Natriuretic peptide (BNP): 131 pg/mL — ABNORMAL HIGH (ref 0.0–100.0)

## 2015-09-11 ENCOUNTER — Encounter: Payer: Self-pay | Admitting: *Deleted

## 2015-09-11 LAB — FRUCTOSAMINE: FRUCTOSAMINE: 302 umol/L — AB (ref 190–270)

## 2015-09-22 ENCOUNTER — Ambulatory Visit (INDEPENDENT_AMBULATORY_CARE_PROVIDER_SITE_OTHER): Payer: Medicare Other | Admitting: Cardiovascular Disease

## 2015-09-22 ENCOUNTER — Encounter: Payer: Self-pay | Admitting: Cardiovascular Disease

## 2015-09-22 VITALS — BP 130/60 | HR 60 | Ht 68.0 in | Wt 213.0 lb

## 2015-09-22 DIAGNOSIS — E785 Hyperlipidemia, unspecified: Secondary | ICD-10-CM | POA: Diagnosis not present

## 2015-09-22 DIAGNOSIS — E118 Type 2 diabetes mellitus with unspecified complications: Secondary | ICD-10-CM

## 2015-09-22 DIAGNOSIS — I5032 Chronic diastolic (congestive) heart failure: Secondary | ICD-10-CM

## 2015-09-22 DIAGNOSIS — I10 Essential (primary) hypertension: Secondary | ICD-10-CM

## 2015-09-22 DIAGNOSIS — I4891 Unspecified atrial fibrillation: Secondary | ICD-10-CM | POA: Diagnosis not present

## 2015-09-22 MED ORDER — FUROSEMIDE 20 MG PO TABS: 20.0000 mg | ORAL_TABLET | Freq: Three times a day (TID) | ORAL | 11 refills | Status: DC | PRN

## 2015-09-22 NOTE — Patient Instructions (Addendum)
Medication Instructions:   No medication changes made  Please take extra lasix 40 mg as needed for shortness of breath, leg swelling, weight gain  Labwork:  No new labs needed  Testing/Procedures:  No further testing at this time   Follow-Up: It was a pleasure seeing you in the office today. Please call us if you have new issues that need to be addressed before your next appt.  [phone]  Your physician wants you to follow-up in: 6 months.  You will receive a reminder letter in the mail two months in advance. If you don't receive a letter, please call our office to schedule the follow-up appointment.  If you need a refill on your cardiac medications before your next appointment, please call your pharmacy.

## 2015-09-22 NOTE — Progress Notes (Signed)
Cardiology Office Note  Date:  09/22/2015   ID:  [name], DOB Nov 01, 1933, MRN [id number]  PCP:  Michael Bush, MD   Chief Complaint  Patient presents with  . Other    6 month f/u c/o fluid retention. Meds reviewed verbally with pt.    HPI:  [name] is a very pleasant 82 yo gentleman with a long history of smoking, suspected COPD, no known coronary artery disease,    mid May 2012 with respiratory distress, right lower lobe pneumonia, new atrial fibrillation,  admission to [facility] in early 2013 for right upper lobe pneumonia. GI bleed June 2013, followed by Dr. Fuller [name] for gastric varices, AV malformations on upper GI,  Admission 09/12/2011 for COPD exacerbation. He presents for routine followup today of his atrial fibrillation  In follow-up today, he reports that 2 weeks ago he had hypoxia, increasing shortness of breath Home since her noted drop in oxygen level into the 80s, high 70s with walking Seen by primary care, recommended to increase his Lasix Now better following lasix 40 BID for 5 days, Now back to 20 BID Still with some pitting leg edema He does drink significant fluids in the daytime Also recently started on inhaler, does not feel it is helping very much  He is sedentary, legs are getting weaker  Previously reported leg pain at nighttime, could be one leg, then the other, has to get out of bed and move his legs to make the pain [name] away  more of a nerve pain  He is currently not on anticoagulation secondary to history of GI bleeding Denies any recent GI bleeding Was seen by GI February 2016 and February 2015 In the past it was felt he was high risk of bleeding given gastric varices and AV malformations  Discussed anticoagulation with him again, he is not interested in starting a blood thinner  Previous total cholesterol 104, LDL 53, hemoglobin A1c 5.9  EKG today showing atrial fibrillation with rate of 60 bpm, nonspecific T-wave abnormality  Other past  medical history  episode of atrial fibrillation in December 2013.  He did not restart his anticoagulation as it was very expensive and he was in the donut hole. He is also concerned about recurrent GI bleed. Previous leg edema on amlodipine  Previous hospital admission June 27 2011  with GI bleed.  Upper endoscopy Showed angiodysplasias with ablation, gastric varices. He was started on proton pump inhibitor. He was also treated for C. difficile . Hematocrit reached  low 20s on admission.  his anticoagulation was held at discharge . His dose of Coreg was also significantly decreased at discharge from 25 mg to 6.25 mg twice a day.  Echocardiogram May 2013 shows normal LV systolic function, greater than 55%, mildly dilated left atrium, no diastolic dysfunction   PMH:   has a past medical history of Arthritis; Atrial fibrillation ([place]) (06/23/2010); B12 deficiency; C. difficile colitis (07/06/2011); CHF (congestive heart failure) ([place]); Chronic a-fib (HCC); COPD (chronic obstructive pulmonary disease) ([place]); Cryptogenic cirrhosis ([place]); Diabetes mellitus, type 2 ([place]); Gastric and duodenal angiodysplasia (06/28/2011); Gastric varices; GI bleed; Hemorrhoids; History of bladder cancer (1984); History of ileostomy ([place]); History of pneumonia (2012); Hyperlipidemia; Hypertension; Inguinal hernia; Iron deficiency anemia; Pinched nerve; Pyloric stenosis; RLL pneumonia (01/28/2014); and Shortness of breath.  PSH:    Past Surgical History:  Procedure Laterality Date  . BE  08/08/2003  . BLADDER REMOVAL     s/p urostomy bag  . COLONOSCOPY  07/24/2003  Int. hemorrhoids  . COLONOSCOPY  10/26/2006   Int hemorrhoids, 10 yrs  . ESOPHAGOGASTRODUODENOSCOPY  10/26/2006   Gastric varices, pyloric stenosis  . ESOPHAGOGASTRODUODENOSCOPY  07/01/2011   Procedure: ESOPHAGOGASTRODUODENOSCOPY (EGD);  Surgeon: Michael Bears, MD;  Location: [place];  Service: Gastroenterology;  Laterality: N/A;  .  ESOPHAGOGASTRODUODENOSCOPY  06/28/2011   Procedure: ESOPHAGOGASTRODUODENOSCOPY (EGD);  Surgeon: Michael Bears, MD;  Location: [place];  Service: Gastroenterology;  Laterality: N/A;  . FETAL BLOOD TRANSFUSION    . ILEOSTOMY  1984   bladder cancer  . PENILE PROSTHESIS IMPLANT  1990   inflatable implant  . PROSTATECTOMY  1984   bladder cancer  . US ECHOCARDIOGRAPHY  05/1995   EF 60% TR, AI    Current Outpatient Prescriptions  Medication Sig Dispense Refill  . acetaminophen (TYLENOL) 325 MG tablet Take 325 mg by mouth every 6 (six) hours as needed.    . cloNIDine (CATAPRES) 0.2 MG tablet Take 1 tablet (0.2 mg total) by mouth 2 (two) times daily. 180 tablet 3  . doxazosin (CARDURA) 8 MG tablet Take 0.5 tablets (4 mg total) by mouth 2 (two) times daily. 90 tablet 3  . ferrous sulfate (FERRO-BOB) 325 (65 FE) MG tablet Take 1 tablet (325 mg total) by mouth daily with breakfast. (Patient taking differently: Take 325 mg by mouth daily. )    . furosemide (LASIX) 20 MG tablet Take 1 tablet (20 mg total) by mouth 3 (three) times daily as needed. 90 tablet 11  . glucose blood (ONE TOUCH ULTRA TEST) test strip Use to test sugar once daily and as needed Dx: E11.8 100 each 1  . losartan (COZAAR) 100 MG tablet Take 1 tablet (100 mg total) by mouth daily. 90 tablet 3  . Magnesium Oxide 400 (240 Mg) MG TABS take 1 tablet by mouth once daily 90 tablet 3  . metFORMIN (GLUCOPHAGE) 500 MG tablet TAKE 1/2 TABLET BY MOUTH EVERY DAY WITH BREAKFAST 45 tablet 3  . nadolol (CORGARD) 40 MG tablet Take 1 tablet (40 mg total) by mouth daily. 90 tablet 3  . ONE TOUCH LANCETS MISC One daily and as needed/ ICD code 250.00     . pantoprazole (PROTONIX) 40 MG tablet Take 1 tablet (40 mg total) by mouth daily. 90 tablet 3  . Respiratory Therapy Supplies (NEBULIZER) DEVI by Does not apply route. Use as directed     . simvastatin (ZOCOR) 20 MG tablet Take 1 tablet (20 mg total) by mouth daily. 90 tablet 3  . triamcinolone  cream (KENALOG) 0.1 % Apply 1 application topically 2 (two) times daily. Apply to AA. 30 g 0  . umeclidinium-vilanterol (ANORO ELLIPTA) 62.5-25 MCG/INH AEPB Inhale 1 puff into the lungs daily. Per Dr Raul Del 60 each 1  . vitamin B-12 (CYANOCOBALAMIN) 500 MCG tablet Take 500 mcg by mouth daily.     No current facility-administered medications for this visit.      Allergies:   Review of patient's allergies indicates no known allergies.   Social History:  The patient  reports that he quit smoking about 26 years ago. His smoking use included Cigarettes. He has a 45.00 pack-year smoking history. He has never used smokeless tobacco. He reports that he does not drink alcohol or use drugs.   Family History:   family history includes Coronary artery disease in his sister and sister; Diabetes in his brother; Heart disease in his brother and father; Hypertension in his mother; Osteoarthritis in his mother; Stroke in his other.  Review of Systems: Review of Systems  Constitutional: Negative.   Respiratory: Positive for shortness of breath.   Cardiovascular: Positive for leg swelling.  Gastrointestinal: Negative.   Musculoskeletal: Negative.   Neurological: Negative.   Psychiatric/Behavioral: Negative.   All other systems reviewed and are negative.    PHYSICAL EXAM: VS:  BP 130/60 (BP Location: Left Arm, Patient Position: Sitting, Cuff Size: Normal)   Pulse 60   Ht 5\' 8"  (1.727 m)   Wt 213 lb (96.6 kg)   BMI 32.39 kg/m  , BMI Body mass index is 32.39 kg/m. GEN: Well nourished, well developed, in no acute distress  HEENT: normal  Neck: no JVD, carotid bruits, or masses Cardiac: RRR; no murmurs, rubs, or gallops, trace lower extremity edema bilateral lower extremities to the low shins Respiratory:  Mildly decreased breath sounds throughout particularly at the bases, normal work of breathing GI: soft, nontender, nondistended, + BS MS: no deformity or atrophy  Skin: warm and dry, no  rash Neuro:  Strength and sensation are intact Psych: euthymic mood, full affect    Recent Labs: 03/05/2015: TSH 4.04 09/09/2015: ALT 13; BUN 15; Creatinine, Ser 1.18; Hemoglobin 10.7; Platelets 101.0; Potassium 4.2; Pro B Natriuretic peptide (BNP) 131.0; Sodium 143    Lipid Panel Lab Results  Component Value Date   CHOL 104 03/05/2015   HDL 31.80 (L) 03/05/2015   LDLCALC 53 03/05/2015   TRIG 96.0 03/05/2015      Wt Readings from Last 3 Encounters:  09/22/15 213 lb (96.6 kg)  09/09/15 216 lb 12.8 oz (98.3 kg)  03/25/15 215 lb 12 oz (97.9 kg)       ASSESSMENT AND [name]:  Atrial fibrillation, unspecified type ([place]) - [name]: EKG 12-Lead Rate well controlled, Does not want anticoagulation given history of GI bleeding  HLD (hyperlipidemia) Cholesterol is at goal on the current lipid regimen. No changes to the medications were made.  Essential hypertension Blood pressure is well controlled on today's visit. No changes made to the medications.  Chronic diastolic congestive heart failure (HCC) Recent acute on chronic diastolic CHF exacerbation improved with Lasix 40 twice a day for 5 days Encouraged him to watch his fluid, weights daily Will likely need 40 mg Lasix here and there, possibly once or twice a week to maintain his weight. CHF education provided.   Controlled type 2 diabetes mellitus with complication, without long-term current use of insulin ([place]) We have encouraged continued exercise, careful diet management in an effort to lose weight.    Total encounter time more than 25 minutes  Greater than 50% was spent in counseling and coordination of care with the patient   Disposition:   F/U  6 months   Orders Placed This Encounter  Procedures  . EKG 12-Lead     Signed, Esmond Plants, M.D., Ph.D. 09/22/2015  [place], [place]

## 2015-11-04 ENCOUNTER — Encounter: Payer: Self-pay | Admitting: Family Medicine

## 2015-11-04 LAB — HM DIABETES EYE EXAM

## 2015-11-06 ENCOUNTER — Encounter: Payer: Self-pay | Admitting: Family Medicine

## 2015-12-10 ENCOUNTER — Other Ambulatory Visit: Payer: Self-pay | Admitting: Family Medicine

## 2015-12-10 ENCOUNTER — Other Ambulatory Visit (INDEPENDENT_AMBULATORY_CARE_PROVIDER_SITE_OTHER): Payer: Medicare Other

## 2015-12-10 DIAGNOSIS — K7469 Other cirrhosis of liver: Secondary | ICD-10-CM | POA: Diagnosis not present

## 2015-12-10 DIAGNOSIS — D638 Anemia in other chronic diseases classified elsewhere: Secondary | ICD-10-CM

## 2015-12-10 DIAGNOSIS — E118 Type 2 diabetes mellitus with unspecified complications: Secondary | ICD-10-CM

## 2015-12-10 LAB — CBC WITH DIFFERENTIAL/PLATELET
Basophils Absolute: 0 10*3/uL (ref 0.0–0.1)
Basophils Relative: 0.6 % (ref 0.0–3.0)
EOS ABS: 0.2 10*3/uL (ref 0.0–0.7)
EOS PCT: 5.6 % — AB (ref 0.0–5.0)
HEMATOCRIT: 30.5 % — AB (ref 39.0–52.0)
Hemoglobin: 10.1 g/dL — ABNORMAL LOW (ref 13.0–17.0)
Lymphocytes Relative: 24.8 % (ref 12.0–46.0)
Lymphs Abs: 0.8 10*3/uL (ref 0.7–4.0)
MCHC: 33.2 g/dL (ref 30.0–36.0)
MCV: 88.9 fl (ref 78.0–100.0)
MONO ABS: 0.3 10*3/uL (ref 0.1–1.0)
MONOS PCT: 9.2 % (ref 3.0–12.0)
NEUTROS PCT: 59.8 % (ref 43.0–77.0)
Neutro Abs: 2 10*3/uL (ref 1.4–7.7)
PLATELETS: 93 10*3/uL — AB (ref 150.0–400.0)
RBC: 3.43 Mil/uL — ABNORMAL LOW (ref 4.22–5.81)
RDW: 15.9 % — AB (ref 11.5–15.5)
WBC: 3.4 10*3/uL — AB (ref 4.0–10.5)

## 2015-12-10 LAB — HEPATIC FUNCTION PANEL
ALBUMIN: 3.7 g/dL (ref 3.5–5.2)
ALT: 11 U/L (ref 0–53)
AST: 23 U/L (ref 0–37)
Alkaline Phosphatase: 100 U/L (ref 39–117)
Bilirubin, Direct: 0.3 mg/dL (ref 0.0–0.3)
Total Bilirubin: 0.9 mg/dL (ref 0.2–1.2)
Total Protein: 6.9 g/dL (ref 6.0–8.3)

## 2015-12-10 LAB — BASIC METABOLIC PANEL
BUN: 16 mg/dL (ref 6–23)
CO2: 30 mEq/L (ref 19–32)
CREATININE: 1.19 mg/dL (ref 0.40–1.50)
Calcium: 8.9 mg/dL (ref 8.4–10.5)
Chloride: 108 mEq/L (ref 96–112)
GFR: 62.44 mL/min (ref >60.00)
Glucose, Bld: 106 mg/dL — ABNORMAL HIGH (ref 70–99)
POTASSIUM: 4 meq/L (ref 3.5–5.1)
Sodium: 145 mEq/L (ref 135–145)

## 2015-12-10 LAB — HEMOGLOBIN A1C: HEMOGLOBIN A1C: 5.6 % (ref 4.6–6.5)

## 2015-12-10 LAB — PROTIME-INR
INR: 1.3 ratio — AB (ref 0.8–1.0)
Prothrombin Time: 13.8 s — ABNORMAL HIGH (ref 9.6–13.1)

## 2015-12-12 LAB — FRUCTOSAMINE: FRUCTOSAMINE: 305 umol/L — AB (ref 190–270)

## 2015-12-16 ENCOUNTER — Ambulatory Visit (INDEPENDENT_AMBULATORY_CARE_PROVIDER_SITE_OTHER): Payer: Medicare Other | Admitting: Family Medicine

## 2015-12-16 ENCOUNTER — Encounter: Payer: Self-pay | Admitting: Family Medicine

## 2015-12-16 VITALS — BP 140/70 | HR 81 | Wt 221.8 lb

## 2015-12-16 DIAGNOSIS — E118 Type 2 diabetes mellitus with unspecified complications: Secondary | ICD-10-CM | POA: Diagnosis not present

## 2015-12-16 DIAGNOSIS — K7469 Other cirrhosis of liver: Secondary | ICD-10-CM | POA: Diagnosis not present

## 2015-12-16 DIAGNOSIS — Z23 Encounter for immunization: Secondary | ICD-10-CM | POA: Diagnosis not present

## 2015-12-16 DIAGNOSIS — Z936 Other artificial openings of urinary tract status: Secondary | ICD-10-CM

## 2015-12-16 DIAGNOSIS — R600 Localized edema: Secondary | ICD-10-CM

## 2015-12-16 DIAGNOSIS — R531 Weakness: Secondary | ICD-10-CM

## 2015-12-16 DIAGNOSIS — R6889 Other general symptoms and signs: Secondary | ICD-10-CM | POA: Diagnosis not present

## 2015-12-16 DIAGNOSIS — D61818 Other pancytopenia: Secondary | ICD-10-CM

## 2015-12-16 DIAGNOSIS — J449 Chronic obstructive pulmonary disease, unspecified: Secondary | ICD-10-CM

## 2015-12-16 DIAGNOSIS — K31819 Angiodysplasia of stomach and duodenum without bleeding: Secondary | ICD-10-CM

## 2015-12-16 DIAGNOSIS — I5032 Chronic diastolic (congestive) heart failure: Secondary | ICD-10-CM

## 2015-12-16 MED ORDER — FUROSEMIDE 20 MG PO TABS: ORAL_TABLET | ORAL | 11 refills | Status: DC

## 2015-12-16 MED ORDER — POTASSIUM CHLORIDE CRYS ER 10 MEQ PO TBCR: 10.0000 meq | EXTENDED_RELEASE_TABLET | Freq: Every day | ORAL | 11 refills | Status: DC

## 2015-12-16 NOTE — Assessment & Plan Note (Signed)
Reports compliance with anoro ellipta and rescue medication PRN. Stable period regarding COPD.

## 2015-12-16 NOTE — Assessment & Plan Note (Addendum)
Will need to update TSH next visit.

## 2015-12-16 NOTE — Assessment & Plan Note (Signed)
Fluid overloaded on exam today - will increase lasix to 40/20mg  daily and will start potassium 35mEq daily.  Anticipate goal dry weight closer to 205-210lbs.

## 2015-12-16 NOTE — Assessment & Plan Note (Signed)
Deteriorated - will increase lasix to 40mg /20mg  daily. See above.

## 2015-12-16 NOTE — Progress Notes (Signed)
BP 140/70   Pulse 81   Wt 221 lb 12.8 oz (100.6 kg)   SpO2 93%   BMI 33.72 kg/m    CC: 43mo f/u visit Subjective:    Patient ID: [name], male    DOB: September 08, 1933, 82 y.o.   MRN: [id number]  HPI: [name] is a 82 y.o. male presenting on 12/16/2015 for Follow-up   See prior note for details. Worsening dyspnea in COPD, HFpEF, cryptogenic cirrhosis and diabetes. Last visit we [facility] to [facility] for further assistance at home and for portable oxygen. Worsening exertional desaturations noted. At that time, concern for acute CHF exacerbation - treated with increased lasix burst x 5 days (40mg  bid).   Not currently on supplemental O2 - but has tank in car.  Increase in fatigue and exertional dyspnea. Denies fevers/chills, abd pain, cough, chest pain, skin infection, diarrhea.   COPD - on anoro ellipta 1 puff daily. GI - needs to schedule f/u (last seen 02/2014).  CHF followed by Dr Rockey Situ - currently on lasix 20mg  bid with extra PRN swelling.   Relevant past medical, surgical, family and social history reviewed and updated as indicated. Interim medical history since our last visit reviewed. Allergies and medications reviewed and updated. Current Outpatient Prescriptions on File Prior to Visit  Medication Sig  . acetaminophen (TYLENOL) 325 MG tablet Take 325 mg by mouth every 6 (six) hours as needed.  . cloNIDine (CATAPRES) 0.2 MG tablet Take 1 tablet (0.2 mg total) by mouth 2 (two) times daily.  Marland Kitchen doxazosin (CARDURA) 8 MG tablet Take 0.5 tablets (4 mg total) by mouth 2 (two) times daily.  . ferrous sulfate (FERRO-BOB) 325 (65 FE) MG tablet Take 1 tablet (325 mg total) by mouth daily with breakfast. (Patient taking differently: Take 325 mg by mouth daily. )  . glucose blood (ONE TOUCH ULTRA TEST) test strip Use to test sugar once daily and as needed Dx: E11.8  . losartan (COZAAR) 100 MG tablet Take 1 tablet (100 mg total) by mouth daily.  . Magnesium Oxide 400 (240 Mg) MG TABS take 1  tablet by mouth once daily  . metFORMIN (GLUCOPHAGE) 500 MG tablet TAKE 1/2 TABLET BY MOUTH EVERY DAY WITH BREAKFAST  . nadolol (CORGARD) 40 MG tablet Take 1 tablet (40 mg total) by mouth daily.  . ONE TOUCH LANCETS MISC One daily and as needed/ ICD code 250.00   . pantoprazole (PROTONIX) 40 MG tablet Take 1 tablet (40 mg total) by mouth daily.  Marland Kitchen Respiratory Therapy Supplies (NEBULIZER) DEVI by Does not apply route. Use as directed   . simvastatin (ZOCOR) 20 MG tablet Take 1 tablet (20 mg total) by mouth daily.  Marland Kitchen triamcinolone cream (KENALOG) 0.1 % Apply 1 application topically 2 (two) times daily. Apply to AA.  . umeclidinium-vilanterol (ANORO ELLIPTA) 62.5-25 MCG/INH AEPB Inhale 1 puff into the lungs daily. Per Dr Raul Del  . vitamin B-12 (CYANOCOBALAMIN) 500 MCG tablet Take 500 mcg by mouth daily.   No current facility-administered medications on file prior to visit.     Review of Systems Per HPI unless specifically indicated in ROS section     Objective:    BP 140/70   Pulse 81   Wt 221 lb 12.8 oz (100.6 kg)   SpO2 93%   BMI 33.72 kg/m   Wt Readings from Last 3 Encounters:  12/16/15 221 lb 12.8 oz (100.6 kg)  09/22/15 213 lb (96.6 kg)  09/09/15 216 lb 12.8 oz (98.3  kg)    Physical Exam  Constitutional: He is oriented to person, place, and time. He appears well-developed and well-nourished. No distress.  HENT:  Mouth/Throat: Oropharynx is clear and moist. No oropharyngeal exudate.  Cardiovascular: Normal rate, regular rhythm, normal heart sounds and intact distal pulses.   No murmur heard. Pulmonary/Chest: Effort normal and breath sounds normal. No respiratory distress. He has no wheezes. He has no rales.  Bibasilar crackles  Musculoskeletal: He exhibits edema (2+ pedal edema to knees).  Neurological: He is alert and oriented to person, place, and time.  Skin: Skin is warm and dry. No rash noted.  Psychiatric: He has a normal mood and affect.  Nursing note and vitals  reviewed.  Results for orders placed or performed in visit on 12/10/15  CBC with Differential/Platelet  Result Value Ref Range   WBC 3.4 (L) 4.0 - 10.5 K/uL   RBC 3.43 (L) 4.22 - 5.81 Mil/uL   Hemoglobin 10.1 (L) 13.0 - 17.0 g/dL   HCT 30.5 (L) 39.0 - 52.0 %   MCV 88.9 78.0 - 100.0 fl   MCHC 33.2 30.0 - 36.0 g/dL   RDW 15.9 (H) 11.5 - 15.5 %   Platelets 93.0 (L) 150.0 - 400.0 K/uL   Neutrophils Relative % 59.8 43.0 - 77.0 %   Lymphocytes Relative 24.8 12.0 - 46.0 %   Monocytes Relative 9.2 3.0 - 12.0 %   Eosinophils Relative 5.6 (H) 0.0 - 5.0 %   Basophils Relative 0.6 0.0 - 3.0 %   Neutro Abs 2.0 1.4 - 7.7 K/uL   Lymphs Abs 0.8 0.7 - 4.0 K/uL   Monocytes Absolute 0.3 0.1 - 1.0 K/uL   Eosinophils Absolute 0.2 0.0 - 0.7 K/uL   Basophils Absolute 0.0 0.0 - 0.1 K/uL  Basic metabolic panel  Result Value Ref Range   Sodium 145 135 - 145 mEq/L   Potassium 4.0 3.5 - 5.1 mEq/L   Chloride 108 96 - 112 mEq/L   CO2 30 19 - 32 mEq/L   Glucose, Bld 106 (H) 70 - 99 mg/dL   BUN 16 6 - 23 mg/dL   Creatinine, Ser 1.19 0.40 - 1.50 mg/dL   Calcium 8.9 8.4 - 10.5 mg/dL   GFR 62.44 >60.00 mL/min  Hemoglobin A1c  Result Value Ref Range   Hgb A1c MFr Bld 5.6 4.6 - 6.5 %  Fructosamine  Result Value Ref Range   Fructosamine 305 (H) 190 - 270 umol/L  Hepatic function panel  Result Value Ref Range   Total Bilirubin 0.9 0.2 - 1.2 mg/dL   Bilirubin, Direct 0.3 0.0 - 0.3 mg/dL   Alkaline Phosphatase 100 39 - 117 U/L   AST 23 0 - 37 U/L   ALT 11 0 - 53 U/L   Total Protein 6.9 6.0 - 8.3 g/dL   Albumin 3.7 3.5 - 5.2 g/dL  Protime-INR  Result Value Ref Range   INR 1.3 (H) 0.8 - 1.0 ratio   Prothrombin Time 13.8 (H) 9.6 - 13.1 sec   Lab Results  Component Value Date   TSH 4.04 03/05/2015       Assessment & Plan:   Problem List Items Addressed This Visit    Cirrhosis, cryptogenic ([place]) - Primary    Encouraged f/u with GI as overdue. MELD score = 11. Consider addition of spironolactone if  lasix ineffective.  plt 93, albumin 3.7.       Cold intolerance    Will need to update TSH next visit.  Controlled diabetes mellitus type 2 with complications (HCC)    Fructosamine likely more reliable in this patient - correlating to A1c 7.5%. No changes today.      COPD - on 2-3 L O2 at night    Reports compliance with anoro ellipta and rescue medication PRN. Stable period regarding COPD.       Diastolic CHF (HCC)    Fluid overloaded on exam today - will increase lasix to 40/20mg  daily and will start potassium 38mEq daily.  Anticipate goal dry weight closer to 205-210lbs.       Relevant Medications   furosemide (LASIX) 20 MG tablet   Gastric and duodenal angiodysplasia   Relevant Medications   furosemide (LASIX) 20 MG tablet   Generalized weakness   Leg edema    Deteriorated - will increase lasix to 40mg /20mg  daily. See above.      Other pancytopenia ([place])    Stable. [facility]its close monitoring. F/u 3 mo.      Presence of urostomy ([place])       Follow up plan: Return in about 3 months (around 03/17/2016), or if symptoms worsen or fail to improve, for follow up visit.  Ria Bush, MD

## 2015-12-16 NOTE — Patient Instructions (Addendum)
Flu shot today Schedule GI follow up appointment as we're overdue.  Increase lasix to 40mg  in the morning, 20mg  in the afternoon. New dose.  Start potassium 60mEq daily.  Goal weight is around 210lbs.  Return in 3 months for follow up visit, sooner if needed

## 2015-12-16 NOTE — Assessment & Plan Note (Signed)
Fructosamine likely more reliable in this patient - correlating to A1c 7.5%. No changes today.

## 2015-12-16 NOTE — Assessment & Plan Note (Addendum)
Encouraged f/u with GI as overdue. MELD score = 11. Consider addition of spironolactone if lasix ineffective.  plt 93, albumin 3.7.

## 2015-12-16 NOTE — Assessment & Plan Note (Addendum)
Stable. [facility]its close monitoring. F/u 3 mo.

## 2016-02-08 DIAGNOSIS — J449 Chronic obstructive pulmonary disease, unspecified: Secondary | ICD-10-CM | POA: Diagnosis not present

## 2016-02-10 ENCOUNTER — Ambulatory Visit (INDEPENDENT_AMBULATORY_CARE_PROVIDER_SITE_OTHER): Payer: PPO | Admitting: Gastroenterology

## 2016-02-10 ENCOUNTER — Encounter: Payer: Self-pay | Admitting: Gastroenterology

## 2016-02-10 VITALS — BP 138/66 | HR 80 | Ht 68.0 in | Wt 212.6 lb

## 2016-02-10 DIAGNOSIS — K7460 Unspecified cirrhosis of liver: Secondary | ICD-10-CM

## 2016-02-10 DIAGNOSIS — R188 Other ascites: Principal | ICD-10-CM

## 2016-02-10 NOTE — Patient Instructions (Signed)
You have been scheduled for an abdominal ultrasound at [facility] (1st floor of hospital) on 02/17/16 at 9:30am. Please arrive 15 minutes prior to your appointment for registration. Make certain not to have anything to eat or drink 6 hours prior to your appointment. Should you need to reschedule your appointment, please contact radiology at [phone]. This test typically takes about 30 minutes to perform.  Please follow up with our new [facility] Gastroenterologist Dr. [name] in [place]. The phone number to make an appointment is [phone].  Thank you for choosing me and [facility].  Pricilla Riffle. Dagoberto Ligas., MD., Marval Regal

## 2016-02-10 NOTE — Progress Notes (Signed)
    History of Present Illness: This is an 83 year old male with cryptogenic cirrhosis returning for follow-up. He is accompanied by his daughter. He is overdue for GI follow-up with last GI office visit about 2 years ago. He was evaluated by Dr. [name] in November with increasing fluid overload felt secondary to CHF. Blood work was obtained and furosemide dose was increased. I reviewed the blood work. The patient relates that he has been following daily weights and he has had a 9 pound weight loss since mid-November. He has no other gastrointestinal complaints.  Current Medications, Allergies, Past Medical History, Past Surgical History, Family History and Social History were reviewed in [employer] record.  Physical Exam: General: Well developed, well nourished, no acute distress Head: Normocephalic and atraumatic Eyes:  sclerae anicteric, EOMI Ears: Normal auditory acuity Mouth: No deformity or lesions Lungs: Clear throughout to auscultation Heart: Regular rate and rhythm; no murmurs, rubs or bruits Abdomen: Soft, non tender and mildly distended. Possible fluid wave. No masses, hepatosplenomegaly or hernias noted. Normal Bowel sounds Musculoskeletal: Symmetrical with no gross deformities  Pulses:  Normal pulses noted Extremities: No clubbing, cyanosis or deformities noted. 1+ pedal edema Neurological: Alert oriented x 4, grossly nonfocal Psychological:  Alert and cooperative. Normal mood and affect  Assessment and Recommendations:  1. Cryptogenic cirrhosis with possible ascites and known gastric varices. Furosemide increased by Dr. [name] in November and pt relates a 9 pound weight loss. Schedule abdominal ultrasound for HCC screening and to assess for ascites. Continue furosemide at current dosing. Consider adding Aldactone if ascites is identified on ultrasound. Follow a 2 g sodium diet. Daily weights, same time of day and same scale. Continue Corgard 40 mg  daily to reduce portal pressures. I recommended GI/liver follow up at least every 6 months. Patient and his daughter relate they would like to find a gastroenterologist in [place] as it is much easier, closer for them for ongoing follow-up. Consider EGD for follow up of gastric varices and screening for esophageal varices, defer to his new gastroenterologist. I suggested Dr. [name].   2. Hepatic lesion at the dome of the right hepatic lobe felt to be a hemangioma or other benign lesion. Reassess with ultrasound as above. Consider follow-up hepatic MRI pending ultrasound review.

## 2016-02-17 ENCOUNTER — Ambulatory Visit (HOSPITAL_COMMUNITY)
Admission: RE | Admit: 2016-02-17 | Discharge: 2016-02-17 | Disposition: A | Discharge status: Home / Self Care | Payer: PPO | Source: Ambulatory Visit | Attending: Gastroenterology | Admitting: Gastroenterology

## 2016-02-17 DIAGNOSIS — R161 Splenomegaly, not elsewhere classified: Secondary | ICD-10-CM | POA: Diagnosis not present

## 2016-02-17 DIAGNOSIS — K760 Fatty (change of) liver, not elsewhere classified: Secondary | ICD-10-CM | POA: Insufficient documentation

## 2016-02-17 DIAGNOSIS — N281 Cyst of kidney, acquired: Secondary | ICD-10-CM | POA: Insufficient documentation

## 2016-02-17 DIAGNOSIS — N261 Atrophy of kidney (terminal): Secondary | ICD-10-CM | POA: Diagnosis not present

## 2016-02-17 DIAGNOSIS — K7460 Unspecified cirrhosis of liver: Secondary | ICD-10-CM | POA: Diagnosis not present

## 2016-02-17 DIAGNOSIS — R188 Other ascites: Secondary | ICD-10-CM

## 2016-02-26 DIAGNOSIS — N261 Atrophy of kidney (terminal): Secondary | ICD-10-CM

## 2016-02-26 HISTORY — DX: Atrophy of kidney (terminal): N26.1

## 2016-03-09 ENCOUNTER — Encounter: Payer: Self-pay | Admitting: Gastroenterology

## 2016-03-09 ENCOUNTER — Ambulatory Visit (INDEPENDENT_AMBULATORY_CARE_PROVIDER_SITE_OTHER): Payer: PPO | Admitting: Gastroenterology

## 2016-03-09 ENCOUNTER — Other Ambulatory Visit
Admission: RE | Admit: 2016-03-09 | Discharge: 2016-03-09 | Disposition: A | Discharge status: Home / Self Care | Payer: PPO | Source: Ambulatory Visit | Attending: Gastroenterology | Admitting: Gastroenterology

## 2016-03-09 VITALS — BP 128/76 | HR 75 | Ht 68.0 in | Wt 217.0 lb

## 2016-03-09 DIAGNOSIS — K7460 Unspecified cirrhosis of liver: Secondary | ICD-10-CM | POA: Diagnosis not present

## 2016-03-09 DIAGNOSIS — R932 Abnormal findings on diagnostic imaging of liver and biliary tract: Secondary | ICD-10-CM | POA: Diagnosis not present

## 2016-03-09 LAB — HEPATIC FUNCTION PANEL
ALT: 16 U/L — AB (ref 17–63)
AST: 34 U/L (ref 15–41)
Albumin: 3.8 g/dL (ref 3.5–5.0)
Alkaline Phosphatase: 105 U/L (ref 38–126)
BILIRUBIN INDIRECT: 0.7 mg/dL (ref 0.3–0.9)
Bilirubin, Direct: 0.2 mg/dL (ref 0.1–0.5)
TOTAL PROTEIN: 7.1 g/dL (ref 6.5–8.1)
Total Bilirubin: 0.9 mg/dL (ref 0.3–1.2)

## 2016-03-09 NOTE — Progress Notes (Signed)
Gastroenterology Consultation  Referring Provider:     Ria Bush, MD Primary Care Physician:  Ria Bush, MD Primary Gastroenterologist:  Dr. [name]  Reason for Consultation:     Cirrhosis of the liver         HPI:   [name] is a 83 y.o. y/o male referred for consultation & management  by Dr. Ria Bush, MD.    [name] being referred for cirrhosis of the liver . He used to receive his care at [facility] with Dr Fuller Plan, He was last seen about 2 years back. His last note mentions that he has had gastric varices, gastric AVM's treated last in 2013/ Etiology of cirrhosis appears cryptogenic.He is on home oxygen. On lasix for CHF. He says he was diagnosed with cirrhosis many years back.   Labs from 11/2015- INR 1.3,   Hepatic function panel normal. Cr 1.19   RUQ usg 01/2016 shows fatty infiltration of the liver, cirrhotic appearance, mild intrahepatic biliary dilation . CBD not dilated. Splenomegaly. 5.2 cm left renal cyst that is simple.   Feeling well. No issues with memory . Occasional confusion. One bowel movement daily.Not hard. Not passed any blood in his stool.   Presently he says the cardiologist is titrating his diuretics.   Diet is very low in salt.   BP 128/76   Pulse 75   Ht 5\' 8"  (1.727 m)   Wt 217 lb (98.4 kg)   BMI 32.99 kg/m    Past Medical History:  Diagnosis Date  . Arthritis   . Atrial fibrillation ([place]) 06/23/2010  . B12 deficiency   . C. difficile colitis 07/06/2011  . CHF (congestive heart failure) (HCC)    diastolic. Echo 5/10 with mild LVH, EF [id number], grade 1 diastolic dysfunction, severe left atrial enlargement, aortic sclerosis  . Chronic a-fib (HCC)    on pradaxa, declined cardioversion in past  . COPD (chronic obstructive pulmonary disease) ([place])    (Dr. Raul Del)  . Cryptogenic cirrhosis ([place])    Followed by Dr Fuller Plan, never biopsied, has been stable.  History of gastric varices.  . Diabetes mellitus, type 2  ([place])   . Gastric and duodenal angiodysplasia 06/28/2011  . Gastric varices   . GI bleed   . Hemorrhoids   . History of bladder cancer 1984   s/p ileal conduit  . History of ileostomy   . History of pneumonia 2012  . Hyperlipidemia   . Hypertension   . Inguinal hernia    left  . Iron deficiency anemia   . Pinched nerve    back  . Pyloric stenosis    mild EGD 10/26/06  . RLL pneumonia ([place]) 01/28/2014  . Shortness of breath     Past Surgical History:  Procedure Laterality Date  . BE  08/08/2003  . BLADDER REMOVAL     s/p urostomy bag  . COLONOSCOPY  07/24/2003   Int. hemorrhoids  . COLONOSCOPY  10/26/2006   Int hemorrhoids, 10 yrs  . ESOPHAGOGASTRODUODENOSCOPY  10/26/2006   Gastric varices, pyloric stenosis  . ESOPHAGOGASTRODUODENOSCOPY  07/01/2011   Procedure: ESOPHAGOGASTRODUODENOSCOPY (EGD);  Surgeon: Jerene Bears, MD;  Location: [facility];  Service: Gastroenterology;  Laterality: N/A;  . ESOPHAGOGASTRODUODENOSCOPY  06/28/2011   Procedure: ESOPHAGOGASTRODUODENOSCOPY (EGD);  Surgeon: Jerene Bears, MD;  Location: [facility];  Service: Gastroenterology;  Laterality: N/A;  . FETAL BLOOD TRANSFUSION    . ILEOSTOMY  1984   bladder cancer  . PENILE PROSTHESIS  IMPLANT  1990   inflatable implant  . PROSTATECTOMY  1984   bladder cancer  . US ECHOCARDIOGRAPHY  05/1995   EF 60% TR, AI    Prior to Admission medications   Medication Sig Start Date End Date Taking? Authorizing Provider  acetaminophen (TYLENOL) 325 MG tablet Take 325 mg by mouth every 6 (six) hours as needed.    Historical Provider, MD  cloNIDine (CATAPRES) 0.2 MG tablet Take 1 tablet (0.2 mg total) by mouth 2 (two) times daily. 03/12/15   Ria Bush, MD  doxazosin (CARDURA) 8 MG tablet Take 0.5 tablets (4 mg total) by mouth 2 (two) times daily. 03/12/15   Ria Bush, MD  ferrous sulfate (FERRO-BOB) 325 (65 FE) MG tablet Take 1 tablet (325 mg total) by mouth daily with breakfast. Patient taking  differently: Take 325 mg by mouth daily.  07/06/11   Debbe Odea, MD  furosemide (LASIX) 20 MG tablet Take 40mg  in morning, 20mg  in afternoon 12/16/15   Ria Bush, MD  glucose blood (ONE TOUCH ULTRA TEST) test strip Use to test sugar once daily and as needed Dx: E11.8 08/28/14   Ria Bush, MD  losartan (COZAAR) 100 MG tablet Take 1 tablet (100 mg total) by mouth daily. 03/12/15   Ria Bush, MD  Magnesium Oxide 400 (240 Mg) MG TABS take 1 tablet by mouth once daily 07/23/15   Ria Bush, MD  metFORMIN (GLUCOPHAGE) 500 MG tablet TAKE 1/2 TABLET BY MOUTH EVERY DAY WITH BREAKFAST 03/12/15   Ria Bush, MD  nadolol (CORGARD) 40 MG tablet Take 1 tablet (40 mg total) by mouth daily. 03/12/15   Ria Bush, MD  ONE TOUCH LANCETS MISC One daily and as needed/ ICD code 250.00     Historical Provider, MD  pantoprazole (PROTONIX) 40 MG tablet Take 1 tablet (40 mg total) by mouth daily. 03/12/15   Ria Bush, MD  potassium chloride SA (K-DUR,KLOR-CON) 10 MEQ tablet Take 1 tablet (10 mEq total) by mouth daily. 12/16/15   Ria Bush, MD  Respiratory Therapy Supplies (NEBULIZER) DEVI by Does not apply route. Use as directed     Historical Provider, MD  simvastatin (ZOCOR) 20 MG tablet Take 1 tablet (20 mg total) by mouth daily. 03/12/15   Ria Bush, MD  triamcinolone cream (KENALOG) 0.1 % Apply 1 application topically 2 (two) times daily. Apply to [place]. 09/09/14   Ria Bush, MD  umeclidinium-vilanterol [facility] ELLIPTA) 62.5-25 MCG/INH AEPB Inhale 1 puff into the lungs daily. Per Dr Raul Del 09/09/15   Ria Bush, MD  vitamin B-12 (CYANOCOBALAMIN) 500 MCG tablet Take 500 mcg by mouth daily.    Historical Provider, MD    Family History  Problem Relation Age of Onset  . Hypertension Mother   . Osteoarthritis Mother   . Heart disease Father     MI  . Coronary artery disease Sister   . Diabetes Brother   . Heart disease Brother     MI  . Coronary artery  disease Sister   . Stroke Other     GPs  . Colon cancer Neg Hx      Social History  Substance Use Topics  . Smoking status: Former Smoker    Packs/day: 1.00    Years: 45.00    Types: Cigarettes    Quit date: 06/22/1989  . Smokeless tobacco: Never Used     Comment: Quit 1990  . Alcohol use No    Allergies as of 03/09/2016  . (No Known Allergies)  Review of Systems:    All systems reviewed and negative except where noted in HPI.   Physical Exam:  There were no vitals taken for this visit. No LMP for male patient. Psych:  Alert and cooperative. Normal mood and affect. General:   Alert,  Well-developed,obese, well-nourished, pleasant and cooperative in NAD Head:  Normocephalic and atraumatic. Eyes:  Sclera clear, no icterus.   Conjunctiva pink. Ears:  Normal auditory acuity. Nose:  No deformity, discharge, or lesions. Mouth:  No deformity or lesions,oropharynx pink & moist. Neck:  Supple; no masses or thyromegaly. Lungs:  Respirations even and unlabored.  Clear throughout to auscultation.   No wheezes, crackles, or rhonchi. No acute distress. Heart:  Regular rate and rhythm; no murmurs, clicks, rubs, or gallops. Abdomen:  Normal bowel sounds.  No bruits.  Soft, non-tender and non-distended without masses, hepatosplenomegaly or hernias noted.  No guarding or rebound tenderness.    Msk:  Symmetrical without gross deformities. Good, equal movement & strength bilaterally. Pulses:  Normal pulses noted. Extremities:  No clubbing or edema.  No cyanosis. Neurologic:  Alert and oriented x3;  grossly normal neurologically. Psych:  Alert and cooperative. Normal mood and affect.  Imaging Studies: US Abdomen Complete  Result Date: 02/17/2016 CLINICAL DATA:  Cirrhosis. EXAM: ABDOMEN ULTRASOUND COMPLETE COMPARISON:  MRI 03/11/2014. FINDINGS: Gallbladder: No gallstones or wall thickening visualized. No sonographic Murphy sign noted by sonographer. Common bile duct: Diameter: 2.8 mm. Mild  intrahepatic biliary ductal dilatation noted. Liver: No focal hepatic abnormality identified. Liver has a heterogeneous parenchymal pattern suggesting fatty infiltration and/or hepatocellular disease. Irregular contour noted consistent with the patient's history of cirrhosis. IVC: No abnormality visualized. Pancreas: Visualized portion unremarkable. Spleen: Spleen is enlarged at 15.8 cm with a volume of 826.5 cc. Right Kidney: Length: 10.9 cm. Cortical thinning . Echogenicity within normal limits. No mass or hydronephrosis visualized. Left Kidney: Length: 11.6 cm. Cortical thinning. Echogenicity within normal limits. No hydronephrosis visualized. 5.2 cm simple cyst. Abdominal aorta: No aneurysm visualized. Other findings: None. IMPRESSION: 1. Heterogeneous parenchymal pattern consistent with fatty infiltration and/or hepatocellular disease. Irregular hepatic contour consistent patient's known history of cirrhosis. No focal hepatic abnormality identified. 2. Mild intrahepatic biliary ductal dilatation. Common bile duct is nondilated. Further evaluation can be obtained with gadolinium-enhanced MRI with MRCP. 3.  Splenomegaly. 4. Bilateral renal cortical thinning consistent with atrophy. 5.2 cm simple left renal cyst. Electronically Signed   By: Marcello Moores  Register   On: 02/17/2016 10:25    Assessment and Plan:   ADONIAS ABUD is a 83 y.o. y/o male has been referred for transfer of care  For cirrhosis of the liver, based on his history it appears etiology is more likely NASH. He is compensated. Doing well he has gastric and esophageal varices and is on nadolol. No ascites. USG liver shows intrahepatic biliary dilation.   Plan :  1. No ascites , no encephalopathy .  2. Low salt diet  3. MRCP to evaluate abnormal liver ultrasound 4. Check for hepatitis A/B immunity and will possibly need vaccination based on results    Follow up in 4-6 weeks  Dr [name] MD

## 2016-03-09 NOTE — Addendum Note (Signed)
Addended by: Clarnce Flock E on: 03/09/2016 02:28 PM   Modules accepted: Orders

## 2016-03-10 DIAGNOSIS — J449 Chronic obstructive pulmonary disease, unspecified: Secondary | ICD-10-CM | POA: Diagnosis not present

## 2016-03-10 LAB — HEPATITIS B SURFACE ANTIBODY, QUANTITATIVE

## 2016-03-10 LAB — HEPATITIS A ANTIBODY, TOTAL: HEP A TOTAL AB: NEGATIVE

## 2016-03-11 ENCOUNTER — Telehealth: Payer: Self-pay

## 2016-03-11 NOTE — Telephone Encounter (Signed)
-----   Message from [name], MD sent at 03/10/2016 12:19 PM EST ----- Needs hep a/b vaccine

## 2016-03-11 NOTE — Telephone Encounter (Signed)
Pt notified of lab results and to let his PCP know he is needing the Hep A & B vaccines.

## 2016-03-15 ENCOUNTER — Other Ambulatory Visit: Payer: Self-pay | Admitting: Family Medicine

## 2016-03-19 ENCOUNTER — Encounter: Payer: Self-pay | Admitting: Family Medicine

## 2016-03-19 ENCOUNTER — Ambulatory Visit (INDEPENDENT_AMBULATORY_CARE_PROVIDER_SITE_OTHER): Payer: PPO | Admitting: Family Medicine

## 2016-03-19 ENCOUNTER — Ambulatory Visit (INDEPENDENT_AMBULATORY_CARE_PROVIDER_SITE_OTHER): Payer: PPO

## 2016-03-19 VITALS — BP 122/72 | HR 69 | Temp 97.5°F | Ht 67.25 in | Wt 216.2 lb

## 2016-03-19 DIAGNOSIS — K7469 Other cirrhosis of liver: Secondary | ICD-10-CM

## 2016-03-19 DIAGNOSIS — I10 Essential (primary) hypertension: Secondary | ICD-10-CM

## 2016-03-19 DIAGNOSIS — D638 Anemia in other chronic diseases classified elsewhere: Secondary | ICD-10-CM | POA: Diagnosis not present

## 2016-03-19 DIAGNOSIS — E118 Type 2 diabetes mellitus with unspecified complications: Secondary | ICD-10-CM | POA: Diagnosis not present

## 2016-03-19 DIAGNOSIS — R600 Localized edema: Secondary | ICD-10-CM

## 2016-03-19 DIAGNOSIS — Z23 Encounter for immunization: Secondary | ICD-10-CM

## 2016-03-19 DIAGNOSIS — R6889 Other general symptoms and signs: Secondary | ICD-10-CM

## 2016-03-19 DIAGNOSIS — I5032 Chronic diastolic (congestive) heart failure: Secondary | ICD-10-CM

## 2016-03-19 DIAGNOSIS — Z936 Other artificial openings of urinary tract status: Secondary | ICD-10-CM | POA: Diagnosis not present

## 2016-03-19 DIAGNOSIS — Z0000 Encounter for general adult medical examination without abnormal findings: Secondary | ICD-10-CM | POA: Diagnosis not present

## 2016-03-19 DIAGNOSIS — J449 Chronic obstructive pulmonary disease, unspecified: Secondary | ICD-10-CM

## 2016-03-19 LAB — BASIC METABOLIC PANEL
BUN: 18 mg/dL (ref 7–25)
CHLORIDE: 109 mmol/L (ref 98–110)
CO2: 24 mmol/L (ref 20–31)
CREATININE: 1.55 mg/dL — AB (ref 0.70–1.11)
Calcium: 9 mg/dL (ref 8.6–10.3)
GLUCOSE: 137 mg/dL — AB (ref 65–99)
POTASSIUM: 4.2 mmol/L (ref 3.5–5.3)
Sodium: 142 mmol/L (ref 135–146)

## 2016-03-19 LAB — TSH: TSH: 4.17 mIU/L (ref 0.40–4.50)

## 2016-03-19 NOTE — Progress Notes (Signed)
PCP notes:   Health maintenance:  Tetanus - postponed/insurance Foot exam - PCP will complete at next appt  Abnormal screenings:   Hearing - failed   Patient concerns:   None  Nurse concerns:  None  Next PCP appt:   09/16/16 @ 1500

## 2016-03-19 NOTE — Progress Notes (Signed)
BP 122/72 (BP Location: Left Arm, Patient Position: Sitting, Cuff Size: Normal)   Pulse 69   Temp 97.5 F (36.4 C) (Oral)   Ht 5' 7.25" (1.708 m)   Wt 216 lb 4 oz (98.1 kg)   SpO2 92%   BMI 33.62 kg/m    CC: 76mo f/u visit Subjective:    Patient ID: DERWIN POKORNEY, male    DOB: [birth year], 83 y.o.   MRN: [id number]  HPI: DEMETRI DALPIAZ is a 83 y.o. male presenting on 03/19/2016 for No chief complaint on file.   Seeing Katha Cabal today for medicare wellness visit, note will be reviewed.   H/o COPD, HFpEF, cryptogenic cirrhosis, and diabetes.  He has established care with local GI Dr Vicente Males. Latest note reviewed. Advised he needed Hep A/B shots. Pending MRI/MRCP for abnormal liver.   Last visit concern for some fluid overload developing - we increased lasix to 40/20mg  daily. Dyspnea some better. No pedal edema. [place] didn't show significant ascites.   Declines shingles shot.   Relevant past medical, surgical, family and social history reviewed and updated as indicated. Interim medical history since our last visit reviewed. Allergies and medications reviewed and updated. Outpatient Medications Prior to Visit  Medication Sig Dispense Refill  . acetaminophen (TYLENOL) 325 MG tablet Take 325 mg by mouth every 6 (six) hours as needed.    . cloNIDine (CATAPRES) 0.2 MG tablet Take 1 tablet (0.2 mg total) by mouth 2 (two) times daily. 180 tablet 3  . doxazosin (CARDURA) 8 MG tablet take 1/2 tablet by mouth twice a day 90 tablet 1  . ferrous sulfate (FERRO-BOB) 325 (65 FE) MG tablet Take 1 tablet (325 mg total) by mouth daily with breakfast. (Patient taking differently: Take 325 mg by mouth daily. )    . furosemide (LASIX) 20 MG tablet Take 40mg  in morning, 20mg  in afternoon 90 tablet 11  . glucose blood (ONE TOUCH ULTRA TEST) test strip Use to test sugar once daily and as needed Dx: E11.8 100 each 1  . losartan (COZAAR) 100 MG tablet Take 1 tablet (100 mg total) by mouth daily. 90 tablet 3  . Magnesium  Oxide 400 (240 Mg) MG TABS take 1 tablet by mouth once daily 90 tablet 3  . metFORMIN (GLUCOPHAGE) 500 MG tablet TAKE 1/2 TABLET BY MOUTH EVERY DAY WITH BREAKFAST 45 tablet 3  . nadolol (CORGARD) 40 MG tablet Take 1 tablet (40 mg total) by mouth daily. 90 tablet 3  . ONE TOUCH LANCETS MISC One daily and as needed/ ICD code 250.00     . pantoprazole (PROTONIX) 40 MG tablet Take 1 tablet (40 mg total) by mouth daily. 90 tablet 3  . potassium chloride SA (K-DUR,KLOR-CON) 10 MEQ tablet Take 1 tablet (10 mEq total) by mouth daily. 30 tablet 11  . Respiratory Therapy Supplies (NEBULIZER) DEVI by Does not apply route. Use as directed     . simvastatin (ZOCOR) 20 MG tablet Take 1 tablet (20 mg total) by mouth daily. 90 tablet 3  . triamcinolone cream (KENALOG) 0.1 % Apply 1 application topically 2 (two) times daily. Apply to AA. 30 g 0  . vitamin B-12 (CYANOCOBALAMIN) 500 MCG tablet Take 500 mcg by mouth daily.    Marland Kitchen umeclidinium-vilanterol (ANORO ELLIPTA) 62.5-25 MCG/INH AEPB Inhale 1 puff into the lungs daily. Per Dr Raul Del 60 each 1   No facility-administered medications prior to visit.      Per HPI unless specifically indicated in ROS section below  Review of Systems     Objective:    BP 122/72 (BP Location: Left Arm, Patient Position: Sitting, Cuff Size: Normal)   Pulse 69   Temp 97.5 F (36.4 C) (Oral)   Ht 5' 7.25" (1.708 m)   Wt 216 lb 4 oz (98.1 kg)   SpO2 92%   BMI 33.62 kg/m   Wt Readings from Last 3 Encounters:  03/19/16 216 lb 4 oz (98.1 kg)  03/19/16 216 lb 4 oz (98.1 kg)  03/09/16 217 lb (98.4 kg)    Physical Exam  Constitutional: He appears well-developed and well-nourished. No distress.  HENT:  Mouth/Throat: Oropharynx is clear and moist. No oropharyngeal exudate.  Cardiovascular: Normal rate, regular rhythm, normal heart sounds and intact distal pulses.   No murmur heard. Pulmonary/Chest: Effort normal and breath sounds normal. No respiratory distress. He has no  wheezes. He has no rales.  Musculoskeletal: He exhibits no edema.  Skin: Skin is warm and dry. No rash noted.  Psychiatric: He has a normal mood and affect.  Nursing note and vitals reviewed.  Results for orders placed or performed during the hospital encounter of 03/09/16  Hepatitis A antibody, total  Result Value Ref Range   Hep A Total Ab Negative Negative  Hepatitis B surface antibody  Result Value Ref Range   Hepatitis B-Post <3.1 (L) Immunity>9.9 mIU/mL  Hepatic function panel  Result Value Ref Range   Total Protein 7.1 6.5 - 8.1 g/dL   Albumin 3.8 3.5 - 5.0 g/dL   AST 34 15 - 41 U/L   ALT 16 (L) 17 - 63 U/L   Alkaline Phosphatase 105 38 - 126 U/L   Total Bilirubin 0.9 0.3 - 1.2 mg/dL   Bilirubin, Direct 0.2 0.1 - 0.5 mg/dL   Indirect Bilirubin 0.7 0.3 - 0.9 mg/dL   Lab Results  Component Value Date   TSH 4.04 03/05/2015    Lab Results  Component Value Date   CREATININE 1.19 12/10/2015       Assessment & Plan:   Problem List Items Addressed This Visit    Anemia, chronic disease   Cirrhosis, cryptogenic ([place])    Has established with Dr Vicente Males. Appreciate GI care of patient. Discussing MRCP      Cold intolerance   Relevant Orders   TSH   Controlled diabetes mellitus type 2 with complications (HCC)   COPD - on 2-3 L O2 at night    Stable period. Has f/u with pulm next week.       Relevant Medications   tiotropium (SPIRIVA) 18 MCG inhalation capsule   Diastolic CHF (HCC)    Stable period - continue lasix 40/20mg  daily, continue potassium 32mEq daily. Update Cr/K today.       Essential hypertension    Chronic, stable. Continue current regimen.      Leg edema - Primary    Improved on current lasix dose - update Cr/K to ensure kidneys tolerating higher dose.        Relevant Orders   Basic metabolic panel   Presence of urostomy ([place])    Stable.           Follow up plan: No Follow-up on file.  Ria Bush, MD

## 2016-03-19 NOTE — Assessment & Plan Note (Signed)
Chronic, stable. Continue current regimen. 

## 2016-03-19 NOTE — Progress Notes (Signed)
Subjective:   [name] is a 83 y.o. male who presents for Medicare Annual/Subsequent preventive examination.  Review of Systems:  N/A Cardiac Risk Factors include: advanced age (>45men, >59 women);male gender;obesity (BMI >30kg/m2);diabetes mellitus;dyslipidemia;hypertension     Objective:    Vitals: BP 122/72 (BP Location: Left Arm, Patient Position: Sitting, Cuff Size: Normal)   Pulse 69   Temp 97.5 F (36.4 C) (Oral)   Ht 5' 7.25" (1.708 m) Comment: no shoes  Wt 216 lb 4 oz (98.1 kg)   SpO2 92%   BMI 33.62 kg/m   Body mass index is 33.62 kg/m.  Tobacco History  Smoking Status  . Former Smoker  . Packs/day: 1.00  . Years: 45.00  . Types: Cigarettes  . Quit date: 06/22/1989  Smokeless Tobacco  . Never Used    Comment: Quit 1990     Counseling given: No   Past Medical History:  Diagnosis Date  . Arthritis   . Atrial fibrillation ([place]) 06/23/2010  . B12 deficiency   . C. difficile colitis 07/06/2011  . CHF (congestive heart failure) (HCC)    diastolic. Echo 5/10 with mild LVH, EF [id number], grade 1 diastolic dysfunction, severe left atrial enlargement, aortic sclerosis  . Chronic a-fib (HCC)    on pradaxa, declined cardioversion in past  . COPD (chronic obstructive pulmonary disease) ([place])    (Dr. Raul Del)  . Cryptogenic cirrhosis ([place])    Followed by Dr Fuller Plan, never biopsied, has been stable.  History of gastric varices.  . Diabetes mellitus, type 2 ([place])   . Gastric and duodenal angiodysplasia 06/28/2011  . Gastric varices   . GI bleed   . Hemorrhoids   . History of bladder cancer 1984   s/p ileal conduit  . History of ileostomy   . History of pneumonia 2012  . Hyperlipidemia   . Hypertension   . Inguinal hernia    left  . Iron deficiency anemia   . Pinched nerve    back  . Pyloric stenosis    mild EGD 10/26/06  . RLL pneumonia ([place]) 01/28/2014  . Shortness of breath    Past Surgical History:  Procedure Laterality Date  . BE  08/08/2003  . BLADDER  REMOVAL     s/p urostomy bag  . COLONOSCOPY  07/24/2003   Int. hemorrhoids  . COLONOSCOPY  10/26/2006   Int hemorrhoids, 10 yrs  . ESOPHAGOGASTRODUODENOSCOPY  10/26/2006   Gastric varices, pyloric stenosis  . ESOPHAGOGASTRODUODENOSCOPY  07/01/2011   Procedure: ESOPHAGOGASTRODUODENOSCOPY (EGD);  Surgeon: Jerene Bears, MD;  Location: [facility];  Service: Gastroenterology;  Laterality: N/A;  . ESOPHAGOGASTRODUODENOSCOPY  06/28/2011   Procedure: ESOPHAGOGASTRODUODENOSCOPY (EGD);  Surgeon: Jerene Bears, MD;  Location: [facility];  Service: Gastroenterology;  Laterality: N/A;  . FETAL BLOOD TRANSFUSION    . ILEOSTOMY  1984   bladder cancer  . PENILE PROSTHESIS IMPLANT  1990   inflatable implant  . PROSTATECTOMY  1984   bladder cancer  . US ECHOCARDIOGRAPHY  05/1995   EF 60% TR, AI   Family History  Problem Relation Age of Onset  . Hypertension Mother   . Osteoarthritis Mother   . Heart disease Father     MI  . Coronary artery disease Sister   . Diabetes Brother   . Heart disease Brother     MI  . Coronary artery disease Sister   . Stroke Other     GPs  . Colon cancer Neg Hx    History  Sexual Activity  . Sexual activity: Not Currently    Outpatient Encounter Prescriptions as of 03/19/2016  Medication Sig  . acetaminophen (TYLENOL) 325 MG tablet Take 325 mg by mouth every 6 (six) hours as needed.  . cloNIDine (CATAPRES) 0.2 MG tablet Take 1 tablet (0.2 mg total) by mouth 2 (two) times daily.  Marland Kitchen doxazosin (CARDURA) 8 MG tablet take 1/2 tablet by mouth twice a day  . ferrous sulfate (FERRO-BOB) 325 (65 FE) MG tablet Take 1 tablet (325 mg total) by mouth daily with breakfast. (Patient taking differently: Take 325 mg by mouth daily. )  . furosemide (LASIX) 20 MG tablet Take 40mg  in morning, 20mg  in afternoon  . glucose blood (ONE TOUCH ULTRA TEST) test strip Use to test sugar once daily and as needed Dx: E11.8  . losartan (COZAAR) 100 MG tablet Take 1 tablet (100 mg total) by  mouth daily.  . Magnesium Oxide 400 (240 Mg) MG TABS take 1 tablet by mouth once daily  . metFORMIN (GLUCOPHAGE) 500 MG tablet TAKE 1/2 TABLET BY MOUTH EVERY DAY WITH BREAKFAST  . nadolol (CORGARD) 40 MG tablet Take 1 tablet (40 mg total) by mouth daily.  . ONE TOUCH LANCETS MISC One daily and as needed/ ICD code 250.00   . pantoprazole (PROTONIX) 40 MG tablet Take 1 tablet (40 mg total) by mouth daily.  . potassium chloride SA (K-DUR,KLOR-CON) 10 MEQ tablet Take 1 tablet (10 mEq total) by mouth daily.  Marland Kitchen Respiratory Therapy Supplies (NEBULIZER) DEVI by Does not apply route. Use as directed   . simvastatin (ZOCOR) 20 MG tablet Take 1 tablet (20 mg total) by mouth daily.  Marland Kitchen tiotropium (SPIRIVA) 18 MCG inhalation capsule Place 18 mcg into inhaler and inhale daily.  Marland Kitchen triamcinolone cream (KENALOG) 0.1 % Apply 1 application topically 2 (two) times daily. Apply to AA.  . vitamin B-12 (CYANOCOBALAMIN) 500 MCG tablet Take 500 mcg by mouth daily.   No facility-administered encounter medications on file as of 03/19/2016.     Activities of Daily Living In your present state of health, do you have any difficulty performing the following activities: 03/19/2016  Hearing? N  Vision? N  Difficulty concentrating or making decisions? N  Walking or climbing stairs? N  Dressing or bathing? N  Doing errands, shopping? Y  Preparing Food and eating ? N  Using the Toilet? N  In the past six months, have you accidently leaked urine? N  Do you have problems with loss of bowel control? N  Managing your Medications? N  Managing your Finances? N  Housekeeping or managing your Housekeeping? N  Some recent data might be hidden    Patient Care Team: Ria Bush, MD as PCP - General (Family Medicine) Minna Merritts, MD as Consulting Physician (Cardiology) Eula Flax, OD as Consulting Physician (Optometry)   Assessment:     Hearing Screening   125Hz  250Hz  500Hz  1000Hz  2000Hz  3000Hz  4000Hz  6000Hz   8000Hz   Right ear:   40 40 40  0    Left ear:   40 40 40  0    Vision Screening Comments: Last vision exam in 2017 with Dr. Glennon Mac at [place] and Dietary recommendations Current Exercise Habits: Home exercise routine, Type of exercise: walking;Other - see comments (yard work), Time (Minutes): 30, Frequency (Times/Week): 4, Weekly Exercise (Minutes/Week): 120, Intensity: Mild, Exercise limited by: None identified  Goals    . Increase physical activity  As weather permits, I will continue to walk and do yard work for 30 minutes several days per week.       Fall Risk Fall Risk  03/19/2016 03/12/2015 02/12/2014 02/09/2013  Falls in the past year? No No No No   Depression Screen PHQ 2/9 Scores 03/19/2016 03/12/2015 02/09/2013 08/06/2011  PHQ - 2 Score 0 2 0 0  PHQ- 9 Score - 6 - -    Cognitive Function MMSE - Mini Mental State Exam 03/19/2016  Orientation to time 5  Orientation to Place 5  Registration 3  Attention/ Calculation 0  Recall 3  Language- name 2 objects 0  Language- repeat 1  Language- follow 3 step command 3  Language- read & follow direction 0  Write a sentence 0  Copy design 0  Total score 20     PLEASE NOTE: A Mini-Cog screen was completed. Maximum score is 20. A value of 0 denotes this part of [place] MMSE was not completed or the patient failed this part of the Mini-Cog screening.   Mini-Cog Screening Orientation to Time - Max 5 pts Orientation to Place - Max 5 pts Registration - Max 3 pts Recall - Max 3 pts Language Repeat - Max 1 pts Language Follow 3 Step Command - Max 3 pts     Immunization History  Administered Date(s) Administered  . Influenza Split 02/05/2011, 12/07/2011  . Influenza Whole 11/11/2005, 12/24/2009, 12/26/2012  . Influenza,inj,Quad PF,36+ Mos 02/12/2014, 12/16/2015  . Influenza-Unspecified 12/25/2014  . Pneumococcal Conjugate-13 09/09/2014  . Pneumococcal Polysaccharide-23 05/25/2004  .  Td 06/01/2002   Screening Tests Health Maintenance  Topic Date Due  . FOOT EXAM  03/10/2017 (Originally 03/11/2016)  . DTaP/Tdap/Td (1 - Tdap) 05/31/2022 (Originally 06/02/2002)  . TETANUS/TDAP  05/31/2022 (Originally 05/31/2012)  . HEMOGLOBIN A1C  06/08/2016  . OPHTHALMOLOGY EXAM  11/03/2016  . INFLUENZA VACCINE  Completed  . PNA vac Low Risk Adult  Completed      Plan:      I have personally reviewed and addressed the Medicare Annual Wellness questionnaire and have noted the following in the patient's chart:  A. Medical and social history B. Use of alcohol, tobacco or illicit drugs  C. Current medications and supplements D. Functional ability and status E.  Nutritional status F.  Physical activity G. Advance directives H. List of other physicians I.  Hospitalizations, surgeries, and ER visits in previous 12 months J.  [place] to include hearing, vision, cognitive, depression L. Referrals and appointments - none  In addition, I have reviewed and discussed with patient certain preventive protocols, quality metrics, and best practice recommendations. A written personalized care plan for preventive services as well as general preventive health recommendations were provided to patient.  See attached scanned questionnaire for additional information.   Signed,   Lindell Noe, MHA, BS, LPN Health Coach

## 2016-03-19 NOTE — Assessment & Plan Note (Signed)
Has established with Dr Vicente Males. Appreciate GI care of patient. Discussing MRCP

## 2016-03-19 NOTE — Assessment & Plan Note (Signed)
Stable

## 2016-03-19 NOTE — Patient Instructions (Addendum)
---Please contact your insurance to determine coverage for Tetanus vaccine.    [name] , Thank you for taking time to come for your Medicare Wellness Visit. I appreciate your ongoing commitment to your health goals. Please review the following plan we discussed and let me know if I can assist you in the future.   These are the goals we discussed: Goals    . Increase physical activity          As weather permits, I will continue to walk and do yard work for 30 minutes several days per week.        This is a list of the screening recommended for you and due dates:  Health Maintenance  Topic Date Due  . Complete foot exam   03/10/2017*  . DTaP/Tdap/Td vaccine (1 - Tdap) 05/31/2022*  . Tetanus Vaccine  05/31/2022*  . Hemoglobin A1C  06/08/2016  . Eye exam for diabetics  11/03/2016  . Flu Shot  Completed  . Pneumonia vaccines  Completed  *Topic was postponed. The date shown is not the original due date.   Preventive Care for Adults  A healthy lifestyle and preventive care can promote health and wellness. Preventive health guidelines for adults include the following key practices.  . A routine yearly physical is a good way to check with your health care provider about your health and preventive screening. It is a chance to share any concerns and updates on your health and to receive a thorough exam.  . Visit your dentist for a routine exam and preventive care every 6 months. Brush your teeth twice a day and floss once a day. Good oral hygiene prevents tooth decay and gum disease.  . The frequency of eye exams is based on your age, health, family medical history, use  of contact lenses, and other factors. Follow your health care provider's ecommendations for frequency of eye exams.  . Eat a healthy diet. Foods like vegetables, fruits, whole grains, low-fat dairy products, and lean protein foods contain the nutrients you need without too many calories. Decrease your intake of foods  high in solid fats, added sugars, and salt. Eat the right amount of calories for you. Get information about a proper diet from your health care provider, if necessary.  . Regular physical exercise is one of the most important things you can do for your health. Most adults should get at least 150 minutes of moderate-intensity exercise (any activity that increases your heart rate and causes you to sweat) each week. In addition, most adults need muscle-strengthening exercises on 2 or more days a week.  Silver Sneakers may be a benefit available to you. To determine eligibility, you may visit the website: www.silversneakers.com or contact program at 702-557-0661 Mon-Fri between 8AM-8PM.   . Maintain a healthy weight. The body mass index (BMI) is a screening tool to identify possible weight problems. It provides an estimate of body fat based on height and weight. Your health care provider can find your BMI and can help you achieve or maintain a healthy weight.   For adults 20 years and older: ? A BMI below 18.5 is considered underweight. ? A BMI of 18.5 to 24.9 is normal. ? A BMI of 25 to 29.9 is considered overweight. ? A BMI of 30 and above is considered obese.   . Maintain normal blood lipids and cholesterol levels by exercising and minimizing your intake of saturated fat. Eat a balanced diet with plenty of fruit and vegetables.  Blood tests for lipids and cholesterol should begin at age 53 and be repeated every 5 years. If your lipid or cholesterol levels are high, you are over 50, or you are at high risk for heart disease, you may need your cholesterol levels checked more frequently. Ongoing high lipid and cholesterol levels should be treated with medicines if diet and exercise are not working.  . If you smoke, find out from your health care provider how to quit. If you do not use tobacco, please do not start.  . If you choose to drink alcohol, please do not consume more than 2 drinks per day.  One drink is considered to be 12 ounces (355 mL) of beer, 5 ounces (148 mL) of wine, or 1.5 ounces (44 mL) of liquor.  . If you are 67-11 years old, ask your health care provider if you should take aspirin to prevent strokes.  . Use sunscreen. Apply sunscreen liberally and repeatedly throughout the day. You should seek shade when your shadow is shorter than you. Protect yourself by wearing long sleeves, pants, a wide-brimmed hat, and sunglasses year round, whenever you are outdoors.  . Once a month, do a whole body skin exam, using a mirror to look at the skin on your back. Tell your health care provider of new moles, moles that have irregular borders, moles that are larger than a pencil eraser, or moles that have changed in shape or color.

## 2016-03-19 NOTE — Assessment & Plan Note (Addendum)
Improved on current lasix dose - update Cr/K to ensure kidneys tolerating higher dose.

## 2016-03-19 NOTE — Assessment & Plan Note (Signed)
Stable period. Has f/u with pulm next week.

## 2016-03-19 NOTE — Patient Instructions (Addendum)
Start hep A/B series - first shot today.  Labs today. Good to see you today.  Return to see me in 4-6 months for follow up visit.

## 2016-03-19 NOTE — Progress Notes (Signed)
Pre visit review using our clinic review tool, if applicable. No additional management support is needed unless otherwise documented below in the visit note. 

## 2016-03-19 NOTE — Assessment & Plan Note (Signed)
Stable period - continue lasix 40/20mg  daily, continue potassium 73mEq daily. Update Cr/K today.

## 2016-03-20 ENCOUNTER — Other Ambulatory Visit: Payer: Self-pay | Admitting: Family Medicine

## 2016-03-20 MED ORDER — FUROSEMIDE 20 MG PO TABS: 20.0000 mg | ORAL_TABLET | Freq: Two times a day (BID) | ORAL | 11 refills | Status: DC

## 2016-03-21 NOTE — Progress Notes (Signed)
I reviewed health advisor's note, was available for consultation, and agree with documentation and plan.  

## 2016-03-22 ENCOUNTER — Telehealth: Payer: Self-pay | Admitting: Gastroenterology

## 2016-03-22 ENCOUNTER — Other Ambulatory Visit: Payer: Self-pay | Admitting: Family Medicine

## 2016-03-22 DIAGNOSIS — J449 Chronic obstructive pulmonary disease, unspecified: Secondary | ICD-10-CM | POA: Diagnosis not present

## 2016-03-22 DIAGNOSIS — R0609 Other forms of dyspnea: Secondary | ICD-10-CM | POA: Diagnosis not present

## 2016-03-23 ENCOUNTER — Ambulatory Visit: Payer: PPO | Admitting: Cardiovascular Disease

## 2016-03-23 ENCOUNTER — Ambulatory Visit: Payer: PPO

## 2016-03-24 ENCOUNTER — Ambulatory Visit: Payer: Medicare Other | Admitting: Cardiovascular Disease

## 2016-03-25 ENCOUNTER — Ambulatory Visit (INDEPENDENT_AMBULATORY_CARE_PROVIDER_SITE_OTHER): Payer: PPO | Admitting: Cardiovascular Disease

## 2016-03-25 ENCOUNTER — Encounter: Payer: Self-pay | Admitting: Cardiovascular Disease

## 2016-03-25 VITALS — BP 120/62 | HR 55 | Ht 67.0 in | Wt 218.0 lb

## 2016-03-25 DIAGNOSIS — E785 Hyperlipidemia, unspecified: Secondary | ICD-10-CM | POA: Diagnosis not present

## 2016-03-25 DIAGNOSIS — I4891 Unspecified atrial fibrillation: Secondary | ICD-10-CM

## 2016-03-25 DIAGNOSIS — I10 Essential (primary) hypertension: Secondary | ICD-10-CM

## 2016-03-25 DIAGNOSIS — I5032 Chronic diastolic (congestive) heart failure: Secondary | ICD-10-CM | POA: Diagnosis not present

## 2016-03-25 NOTE — Progress Notes (Signed)
Cardiology Office Note  Date:  03/25/2016   ID:  [name], DOB 03/01/1933, MRN [id number]  PCP:  Ria Bush, MD   Chief Complaint  Patient presents with  . other    69mo f/u. Pt c/o chest pain, sob at times. Reviewed meds with pt verbally.    HPI:  [name] is a very pleasant 83 yo gentleman with a long history of smoking, suspected COPD, no known coronary artery disease, mid May 2012 with respiratory distress, right lower lobe pneumonia, Persistent atrial fibrillation, admission to [facility] in early 2013 for right upper lobe pneumonia. GI bleed June 2013, followed by Dr. Fuller Plan for gastric varices, AV malformations on upper GI, Admission 09/12/2011 for COPD exacerbation. He presents for routine followup today of his atrial fibrillation History of chronic diastolic CHF   in follow-up today he reports that his weight is approximately 212 up to 214 pounds Weight is up 5 pounds from 6 months ago Lasix dosing was recently decreased from 40 mill grams in the morning and 20 mg in the afternoon , down to 20 ng twice a day as creatinine was elevated 1.55   He does have some lower extremity edema,   reports he has been watching his weight daily at home and write it down Chronic mild shortness of breath, no significant change Previously reported having high fluid intake   In 2017 had periodic hypoxia, increased shortness of breath, oxygen levels and the 80s, high 70s with walking, seen by primary care. At that time was recommended to increase Lasix up to 40 mill grams twice a day for 5 days He had improvement of his symptoms  He is sedentary, legs are getting weaker, no recent falls   chronic nerve pain in his legs   not on anticoagulation secondary to history of GI bleeding Denies any recent GI bleeding seen by GI February 2016 and February 2015 In the past it was felt he was high risk of bleeding given gastric varices and AV malformations   again on today's visit, Discussed  anticoagulation with him again, he is not interested in starting a blood thinner  Previous total cholesterol 104, LDL 53, hemoglobin A1c 5.9  EKG today showing atrial fibrillation with rate of 55 bpm, nonspecific T-wave abnormality  Other past medical history episode of atrial fibrillation in December 2013.  He did not restart his anticoagulation as it was very expensive and he was in the donut hole. He is also concerned about recurrent GI bleed. Previous leg edema on amlodipine  Previous hospital admission June 27 2011 with GI bleed. Upper endoscopy Showed angiodysplasias with ablation, gastric varices. He was started on proton pump inhibitor. He was also treated for C. difficile . Hematocrit reached low 20s on admission. his anticoagulation was held at discharge . His dose of Coreg was also significantly decreased at discharge from 25 mg to 6.25 mg twice a day.  Echocardiogram May 2013 shows normal LV systolic function, greater than 55%, mildly dilated left atrium, no diastolic dysfunction   PMH:   has a past medical history of Arthritis; Atrial fibrillation ([place]) (06/23/2010); B12 deficiency; C. difficile colitis (07/06/2011); CHF (congestive heart failure) ([place]); Chronic a-fib ([facility]); COPD (chronic obstructive pulmonary disease) ([place]); Cryptogenic cirrhosis ([place]); Diabetes mellitus, type 2 ([place]); Gastric and duodenal angiodysplasia (06/28/2011); Gastric varices; GI bleed; Hemorrhoids; History of bladder cancer (1984); History of ileostomy; History of pneumonia (2012); Hyperlipidemia; Hypertension; Inguinal hernia; Iron deficiency anemia; Kidney atrophy (02/2016); Pinched nerve; Pyloric stenosis; RLL pneumonia (  [place]) (01/28/2014); and Shortness of breath.  PSH:    Past Surgical History:  Procedure Laterality Date  . BE  08/08/2003  . BLADDER REMOVAL     s/p urostomy bag  . COLONOSCOPY  07/24/2003   Int. hemorrhoids  . COLONOSCOPY  10/26/2006   Int hemorrhoids, 10 yrs  .  ESOPHAGOGASTRODUODENOSCOPY  10/26/2006   Gastric varices, pyloric stenosis  . ESOPHAGOGASTRODUODENOSCOPY  07/01/2011   Procedure: ESOPHAGOGASTRODUODENOSCOPY (EGD);  Surgeon: Jerene Bears, MD;  Location: [place];  Service: Gastroenterology;  Laterality: N/A;  . ESOPHAGOGASTRODUODENOSCOPY  06/28/2011   Procedure: ESOPHAGOGASTRODUODENOSCOPY (EGD);  Surgeon: Jerene Bears, MD;  Location: [place];  Service: Gastroenterology;  Laterality: N/A;  . FETAL BLOOD TRANSFUSION    . ILEOSTOMY  1984   bladder cancer  . PENILE PROSTHESIS IMPLANT  1990   inflatable implant  . PROSTATECTOMY  1984   bladder cancer  . US ECHOCARDIOGRAPHY  05/1995   EF 60% TR, AI    Current Outpatient Prescriptions  Medication Sig Dispense Refill  . acetaminophen (TYLENOL) 325 MG tablet Take 325 mg by mouth every 6 (six) hours as needed.    . cloNIDine (CATAPRES) 0.2 MG tablet take 1 tablet by mouth twice a day 180 tablet 3  . doxazosin (CARDURA) 8 MG tablet take 1/2 tablet by mouth twice a day 90 tablet 1  . ferrous sulfate (FERRO-BOB) 325 (65 FE) MG tablet Take 1 tablet (325 mg total) by mouth daily with breakfast. (Patient taking differently: Take 325 mg by mouth daily. )    . furosemide (LASIX) 20 MG tablet Take 1 tablet (20 mg total) by mouth 2 (two) times daily. Take 40mg  in morning, 20mg  in afternoon 60 tablet 11  . glucose blood (ONE TOUCH ULTRA TEST) test strip Use to test sugar once daily and as needed Dx: E11.8 100 each 1  . losartan (COZAAR) 100 MG tablet Take 1 tablet (100 mg total) by mouth daily. 90 tablet 3  . Magnesium Oxide 400 (240 Mg) MG TABS take 1 tablet by mouth once daily 90 tablet 3  . metFORMIN (GLUCOPHAGE) 500 MG tablet TAKE 1/2 TABLET BY MOUTH EVERY DAY WITH BREAKFAST 45 tablet 3  . nadolol (CORGARD) 40 MG tablet take 1 tablet by mouth once daily 90 tablet 3  . ONE TOUCH LANCETS MISC One daily and as needed/ ICD code 250.00     . pantoprazole (PROTONIX) 40 MG tablet Take 1 tablet (40 mg  total) by mouth daily. 90 tablet 3  . Respiratory Therapy Supplies (NEBULIZER) DEVI by Does not apply route. Use as directed     . simvastatin (ZOCOR) 20 MG tablet Take 1 tablet (20 mg total) by mouth daily. 90 tablet 3  . tiotropium (SPIRIVA) 18 MCG inhalation capsule Place 18 mcg into inhaler and inhale daily.    Marland Kitchen triamcinolone cream (KENALOG) 0.1 % Apply 1 application topically 2 (two) times daily. Apply to AA. 30 g 0  . vitamin B-12 (CYANOCOBALAMIN) 250 MCG tablet Take 250 mcg by mouth daily.     No current facility-administered medications for this visit.      Allergies:   Patient has no known allergies.   Social History:  The patient  reports that he quit smoking about 26 years ago. His smoking use included Cigarettes. He has a 45.00 pack-year smoking history. He has never used smokeless tobacco. He reports that he does not drink alcohol or use drugs.   Family History:   family history includes Coronary  artery disease in his sister and sister; Diabetes in his brother; Heart disease in his brother and father; Hypertension in his mother; Osteoarthritis in his mother; Stroke in his other.    Review of Systems: Review of Systems  Constitutional: Negative.   Respiratory: Positive for shortness of breath.   Cardiovascular: Positive for leg swelling.  Gastrointestinal: Negative.   Musculoskeletal: Negative.        Gait instability  Neurological: Negative.   Psychiatric/Behavioral: Negative.   All other systems reviewed and are negative.    PHYSICAL EXAM: VS:  BP 120/62 (BP Location: Left Arm, Patient Position: Sitting, Cuff Size: Normal)   Pulse (!) 55   Ht 5\' 7"  (1.702 m)   Wt 218 lb (98.9 kg)   BMI 34.14 kg/m  , BMI Body mass index is 34.14 kg/m. GEN: Well nourished, well developed, in no acute distress , obese HEENT: normal  Neck: no JVD, carotid bruits, or masses Cardiac:irregularly irregular,no murmurs, rubs, or gallops,trace lower extremity pitting edema   Respiratory:  clear to auscultation bilaterally, normal work of breathing GI: soft, nontender, nondistended, + BS MS: no deformity or atrophy  Skin: warm and dry, no rash Neuro:  Strength and sensation are intact Psych: euthymic mood, full affect    Recent Labs: 09/09/2015: Pro B Natriuretic peptide (BNP) 131.0 12/10/2015: Hemoglobin 10.1; Platelets 93.0 03/09/2016: ALT 16 03/19/2016: BUN 18; Creat 1.55; Potassium 4.2; Sodium 142; TSH 4.17    Lipid Panel Lab Results  Component Value Date   CHOL 104 03/05/2015   HDL 31.80 (L) 03/05/2015   LDLCALC 53 03/05/2015   TRIG 96.0 03/05/2015      Wt Readings from Last 3 Encounters:  03/25/16 218 lb (98.9 kg)  03/19/16 216 lb 4 oz (98.1 kg)  03/19/16 216 lb 4 oz (98.1 kg)       ASSESSMENT AND PLAN:  Hyperlipidemia, unspecified hyperlipidemia type - Plan: EKG 12-Lead Currently on simvastatin Cholesterol is at goal on the current lipid regimen. No changes to the medications were made.  Essential hypertension - Plan: EKG 12-Lead Blood pressure is well controlled on today's visit. No changes made to the medications.  Chronic diastolic congestive heart failure ([place]) - Plan: EKG 12-Lead Long discussion with him concerning fluid management He does have edema, 5 pound weight gain in the past 6 months We have recommended he take Lasix 40 mg twice a day for weight over 215 pounds on his scale He currently reports his weight is 212 up to 213 pounds Otherwise he will stay on Lasix 20 g twice a day  Atrial fibrillation, unspecified type ([place]) - Plan: EKG 12-Lead Rate well controlled, Not on anticoagulation as detailed above   Total encounter time more than 25 minutes  Greater than 50% was spent in counseling and coordination of care with the patient   Disposition:   F/U  6 months  Orders Placed This Encounter  Procedures  . EKG 12-Lead     Signed, Esmond Plants, M.D., Ph.D. 03/25/2016  [place],  [place]

## 2016-03-25 NOTE — Patient Instructions (Addendum)
Medication Instructions:   No medication changes made  Please take extra lasix for weight 215 or higher  Labwork:  No new labs needed  Testing/Procedures:  No further testing at this time   I recommend watching educational videos on topics of interest to you at:       [email or web address]  Enter code: HEARTCARE    Follow-Up: It was a pleasure seeing you in the office today. Please call us if you have new issues that need to be addressed before your next appt.  [phone]  Your physician wants you to follow-up in: 6 months.  You will receive a reminder letter in the mail two months in advance. If you don't receive a letter, please call our office to schedule the follow-up appointment.  If you need a refill on your cardiac medications before your next appointment, please call your pharmacy.

## 2016-04-01 ENCOUNTER — Ambulatory Visit
Admission: RE | Admit: 2016-04-01 | Discharge: 2016-04-01 | Disposition: A | Discharge status: Home / Self Care | Payer: PPO | Source: Ambulatory Visit | Attending: Gastroenterology | Admitting: Gastroenterology

## 2016-04-01 ENCOUNTER — Other Ambulatory Visit: Payer: Self-pay | Admitting: Gastroenterology

## 2016-04-01 DIAGNOSIS — R932 Abnormal findings on diagnostic imaging of liver and biliary tract: Secondary | ICD-10-CM | POA: Diagnosis not present

## 2016-04-01 DIAGNOSIS — R161 Splenomegaly, not elsewhere classified: Secondary | ICD-10-CM | POA: Insufficient documentation

## 2016-04-01 DIAGNOSIS — K838 Other specified diseases of biliary tract: Secondary | ICD-10-CM | POA: Insufficient documentation

## 2016-04-01 DIAGNOSIS — K7460 Unspecified cirrhosis of liver: Secondary | ICD-10-CM | POA: Diagnosis not present

## 2016-04-01 DIAGNOSIS — K7689 Other specified diseases of liver: Secondary | ICD-10-CM | POA: Diagnosis not present

## 2016-04-01 DIAGNOSIS — R935 Abnormal findings on diagnostic imaging of other abdominal regions, including retroperitoneum: Secondary | ICD-10-CM | POA: Diagnosis not present

## 2016-04-01 DIAGNOSIS — N281 Cyst of kidney, acquired: Secondary | ICD-10-CM | POA: Insufficient documentation

## 2016-04-01 MED ORDER — GADOXETATE DISODIUM 0.25 MMOL/ML IV SOLN
10.0000 mL | Freq: Once | INTRAVENOUS | Status: AC | PRN
  Administered 2016-04-01: 5 mL via INTRAVENOUS

## 2016-04-07 ENCOUNTER — Other Ambulatory Visit: Payer: Self-pay | Admitting: Family Medicine

## 2016-04-07 DIAGNOSIS — J449 Chronic obstructive pulmonary disease, unspecified: Secondary | ICD-10-CM | POA: Diagnosis not present

## 2016-04-16 ENCOUNTER — Ambulatory Visit (INDEPENDENT_AMBULATORY_CARE_PROVIDER_SITE_OTHER): Payer: PPO

## 2016-04-16 DIAGNOSIS — Z23 Encounter for immunization: Secondary | ICD-10-CM

## 2016-04-16 NOTE — Progress Notes (Signed)
Patient came in today for 2nd combined Hep A/Hep B vaccination. Patient denied any adverse reactions to previous injection of vaccination.  Patient is aware he will take 3rd vaccination at next appt with PCP in August 2018.

## 2016-04-20 ENCOUNTER — Telehealth: Payer: Self-pay | Admitting: Gastroenterology

## 2016-04-20 NOTE — Telephone Encounter (Signed)
When does [name] need to f/u with Dr. Vicente Males.  Patient states that his MRI came back ok? His 6 wk appt was never made.

## 2016-04-22 ENCOUNTER — Other Ambulatory Visit: Payer: Self-pay

## 2016-04-22 NOTE — Telephone Encounter (Signed)
Spoke with patient's daughter and scheduled appointment.

## 2016-04-28 ENCOUNTER — Ambulatory Visit (INDEPENDENT_AMBULATORY_CARE_PROVIDER_SITE_OTHER): Payer: PPO | Admitting: Gastroenterology

## 2016-04-28 ENCOUNTER — Encounter: Payer: Self-pay | Admitting: Gastroenterology

## 2016-04-28 VITALS — BP 118/69 | HR 70 | Ht 67.0 in | Wt 222.4 lb

## 2016-04-28 DIAGNOSIS — K7460 Unspecified cirrhosis of liver: Secondary | ICD-10-CM | POA: Diagnosis not present

## 2016-04-28 DIAGNOSIS — K7581 Nonalcoholic steatohepatitis (NASH): Secondary | ICD-10-CM | POA: Diagnosis not present

## 2016-04-28 NOTE — Progress Notes (Signed)
Primary Care Physician: Ria Bush, MD  Primary Gastroenterologist:  Dr. [name]   Chief Complaint  Patient presents with  . Follow-up    HPI: [name] is a 83 y.o. male is here today to follow up for liver cirhosis.  Summary of history :  He has a history gastric varices, gastric AVM's treated last in 2013/. Etiology of cirrhosis appears cryptogenic.He is on home oxygen. On lasix for CHF. He says he was diagnosed with cirrhosis many years back.   Labs from 11/2015- INR 1.3,   Hepatic function panel normal. Cr 1.19   RUQ usg 01/2016 shows fatty infiltration of the liver, cirrhotic appearance, mild intrahepatic biliary dilation . CBD not dilated. Splenomegaly. 5.2 cm left renal cyst that is simple.   Interval history 02/2016-04/2016  3 2018 - MRCP- shows cirrhosis of the liver with no HCC,hemangioma , no biliary obstruction ,splenomegaly   Needs Hep A/B vaccine with pcp as he is not immune and says he is getting the series and is midway   Feeling well. No issues with memory . No  confusion. One bowel movement daily.soft . Not passed any blood in his stool.   Presently he says the cardiologist is titrating his diuretics.   Diet is very low in salt.  BP 118/69 (BP Location: Left Arm, Patient Position: Sitting, Cuff Size: Large)   Pulse 70   Ht 5\' 7"  (1.702 m)   Wt 222 lb 6.4 oz (100.9 kg)   BMI 34.83 kg/m     Has gained a bit of weight- advised to watch his salt in diet . Occasional frozen meals. No soupsConsumes crackers- advised to stop. Occasional [employer] with sausage biscuit. He is not keen on an EGD to screen for varices for now.    Current Outpatient Prescriptions  Medication Sig Dispense Refill  . cloNIDine (CATAPRES) 0.2 MG tablet take 1 tablet by mouth twice a day 180 tablet 3  . doxazosin (CARDURA) 8 MG tablet take 1/2 tablet by mouth twice a day 90 tablet 1  . ferrous sulfate (FERRO-BOB) 325 (65 FE) MG tablet Take 1 tablet (325 mg total) by  mouth daily with breakfast. (Patient taking differently: Take 325 mg by mouth daily. )    . furosemide (LASIX) 20 MG tablet Take 1 tablet (20 mg total) by mouth 2 (two) times daily. Take 40mg  in morning, 20mg  in afternoon 60 tablet 11  . losartan (COZAAR) 100 MG tablet Take 1 tablet (100 mg total) by mouth daily. 90 tablet 3  . Magnesium Oxide 400 (240 Mg) MG TABS take 1 tablet by mouth once daily 90 tablet 3  . metFORMIN (GLUCOPHAGE) 500 MG tablet take 1/2 tablet by mouth once daily with BREAKFAST 45 tablet 3  . nadolol (CORGARD) 40 MG tablet take 1 tablet by mouth once daily 90 tablet 3  . pantoprazole (PROTONIX) 40 MG tablet Take 1 tablet (40 mg total) by mouth daily. 90 tablet 3  . simvastatin (ZOCOR) 20 MG tablet take 1 tablet by mouth once daily 90 tablet 3  . tiotropium (SPIRIVA) 18 MCG inhalation capsule Place 18 mcg into inhaler and inhale daily.    Marland Kitchen triamcinolone cream (KENALOG) 0.1 % Apply 1 application topically 2 (two) times daily. Apply to AA. 30 g 0  . vitamin B-12 (CYANOCOBALAMIN) 250 MCG tablet Take 250 mcg by mouth daily.    Marland Kitchen acetaminophen (TYLENOL) 325 MG tablet Take 325 mg by mouth every 6 (six) hours as needed.    Marland Kitchen  ANORO ELLIPTA 62.5-25 MCG/INH AEPB   0  . glucose blood (ONE TOUCH ULTRA TEST) test strip Use to test sugar once daily and as needed Dx: E11.8 (Patient not taking: Reported on 04/28/2016) 100 each 1  . ONE TOUCH LANCETS MISC One daily and as needed/ ICD code 250.00     . potassium chloride (K-DUR,KLOR-CON) 10 MEQ tablet   1  . Respiratory Therapy Supplies (NEBULIZER) DEVI by Does not apply route. Use as directed      No current facility-administered medications for this visit.     Allergies as of 04/28/2016  . (No Known Allergies)    ROS:  General: Negative for anorexia, weight loss, fever, chills, fatigue, weakness. ENT: Negative for hoarseness, difficulty swallowing , nasal congestion. CV: Negative for chest pain, angina, palpitations, dyspnea on  exertion, peripheral edema.  Respiratory: Negative for dyspnea at rest, dyspnea on exertion, cough, sputum, wheezing.  GI: See history of present illness. GU:  Negative for dysuria, hematuria, urinary incontinence, urinary frequency, nocturnal urination.  Endo: Negative for unusual weight change.    Physical Examination:   BP 118/69 (BP Location: Left Arm, Patient Position: Sitting, Cuff Size: Large)   Pulse 70   Ht 5\' 7"  (1.702 m)   Wt 222 lb 6.4 oz (100.9 kg)   BMI 34.83 kg/m   General: Well-nourished, well-developed in no acute distress.  Eyes: No icterus. Conjunctivae pink. Mouth: Oropharyngeal mucosa moist and pink , no lesions erythema or exudate. Lungs: Clear to auscultation bilaterally. Non-labored. Heart: Regular rate and rhythm, no murmurs rubs or gallops.  Abdomen: Bowel sounds are normal, nontender, nondistended, no hepatosplenomegaly or masses, no abdominal bruits or hernia , no rebound or guarding.   Extremities: No lower extremity edema. No clubbing or deformities. Neuro: Alert and oriented x 3.  Grossly intact. Skin: Warm and dry, no jaundice.   Psych: Alert and cooperative, normal mood and affect.   Imaging Studies: Mr 3d Recon At Scanner  Result Date: 04/01/2016 CLINICAL DATA:  83 year old male with history of abnormal ultrasound on 02/17/2016 demonstrating heterogeneity throughout the hepatic parenchyma, and intrahepatic biliary ductal dilatation. Followup study. EXAM: MRI ABDOMEN WITHOUT AND WITH CONTRAST (INCLUDING MRCP) TECHNIQUE: Multiplanar multisequence MR imaging of the abdomen was performed both before and after the administration of intravenous contrast. Heavily T2-weighted images of the biliary and pancreatic ducts were obtained, and three-dimensional MRCP images were rendered by post processing. CONTRAST:  5 mL at the this. COMPARISON:  MRI of the abdomen 03/11/2014. Abdominal ultrasound 02/17/2016. FINDINGS: Lower chest: Trace bilateral pleural effusions  lying dependently. Hepatobiliary: Mild diffuse loss of signal intensity throughout the hepatic parenchyma on out of phase dual echo images, compatible with hepatic steatosis. Liver has a shrunken appearance and nodular contour, compatible with underlying cirrhosis. 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 No other suspicious hepatic lesions are noted. No gallstones are identified. Gallbladder is normal in appearance. MRCP images demonstrate mild intra and extrahepatic biliary ductal dilatation. Common bile duct measures up to 14 mm in the porta hepatis. No filling defects in the common bile duct to suggest the presence of a retained ductal stone. Pancreas: No pancreatic mass. No pancreatic ductal dilatation noted on MRCP images. No  pancreatic or peripancreatic fluid or inflammatory changes. Spleen: Spleen is enlarged measuring 14.7 x 6.8 x 18.2 cm (estimated splenic volume of 910 mL). Adrenals/Urinary Tract: Exophytic 5.6 cm simple cyst in the lateral aspect of the interpolar region of the right kidney. Left kidney and bilateral adrenal glands are normal in appearance. No hydroureteronephrosis in the visualized portions of the abdomen. Stomach/Bowel: Visualized portions are unremarkable. Vascular/Lymphatic: No aneurysm identified in the visualized abdominal vasculature. There are numerous portosystemic collateral pathways, most notable for large varices adjacent to the proximal stomach. No lymphadenopathy noted in the abdomen. Other:  No significant volume of ascites. Musculoskeletal: No aggressive appearing osseous lesions are noted in the visualized portions of the skeleton. IMPRESSION: 1. Morphologic  changes in the liver compatible with underlying cirrhosis. No findings to suggest hepatocellular carcinoma at this time. 2. Tiny hypervascular lesion in the dome of the right hepatic lobe is unchanged, compatible with a tiny flash fill cavernous hemangioma. 3. Mild intra and extrahepatic biliary ductal dilatation similar in retrospect to the prior examination from 03/12/2015. No choledocholithiasis or obstructing lesion identified. This may be age related. 4. Splenomegaly with portosystemic collateral vessels, including large varices adjacent to the proximal stomach. 5. Exophytic 5.6 cm simple cyst in the lateral aspect of the interpolar region of the right kidney. Electronically Signed   By: Vinnie Langton M.D.   On: 04/01/2016 10:31   Mr Abdomen Mrcp Moise Boring Contast  Result Date: 04/01/2016 CLINICAL DATA:  83 year old male with history of abnormal ultrasound on 02/17/2016 demonstrating heterogeneity throughout the hepatic parenchyma, and intrahepatic biliary ductal dilatation. Followup study. EXAM: MRI ABDOMEN WITHOUT AND WITH CONTRAST (INCLUDING MRCP) TECHNIQUE: Multiplanar multisequence MR imaging of the abdomen was performed both before and after the administration of intravenous contrast. Heavily T2-weighted images of the biliary and pancreatic ducts were obtained, and three-dimensional MRCP images were rendered by post processing. CONTRAST:  5 mL at the this. COMPARISON:  MRI of the abdomen 03/11/2014. Abdominal ultrasound 02/17/2016. FINDINGS: Lower chest: Trace bilateral pleural effusions lying dependently. Hepatobiliary: Mild diffuse loss of signal intensity throughout the hepatic parenchyma on out of phase dual echo images, compatible with hepatic steatosis. Liver has a shrunken appearance and nodular contour, compatible with underlying cirrhosis. The only focal hepatic lesion noted is a tiny 7 mm lesion in superior aspect of segment 8 (axial image 10 of series 9) which is T1 hypointense, T2  hyperintense, demonstrates early arterial phase enhancement and does not accumulate Eovist on 20 minutes delayed images, stable compared to the prior study, compatible with a flash fill cavernous hemangioma. No other suspicious hepatic lesions are noted. No gallstones are identified. Gallbladder is normal in appearance. MRCP images demonstrate mild intra and extrahepatic biliary ductal dilatation. Common bile duct measures up to 14 mm in the porta hepatis. No filling defects in the common bile duct to suggest the presence of a retained ductal stone. Pancreas: No pancreatic mass. No pancreatic ductal dilatation noted on MRCP images. No pancreatic or peripancreatic fluid or inflammatory changes. Spleen: Spleen is enlarged measuring 14.7 x 6.8 x 18.2 cm (estimated splenic volume of 910 mL). Adrenals/Urinary Tract: Exophytic 5.6 cm simple cyst in the lateral aspect of the interpolar region of the right kidney. Left kidney and bilateral adrenal glands are normal in appearance. No hydroureteronephrosis in the visualized portions of the abdomen. Stomach/Bowel: Visualized portions are unremarkable. Vascular/Lymphatic: No aneurysm identified in the visualized abdominal vasculature. There are numerous portosystemic collateral pathways, most notable for large varices adjacent to the proximal  stomach. No lymphadenopathy noted in the abdomen. Other:  No significant volume of ascites. Musculoskeletal: No aggressive appearing osseous lesions are noted in the visualized portions of the skeleton. IMPRESSION: 1. Morphologic changes in the liver compatible with underlying cirrhosis. No findings to suggest hepatocellular carcinoma at this time. 2. Tiny hypervascular lesion in the dome of the right hepatic lobe is unchanged, compatible with a tiny flash fill cavernous hemangioma. 3. Mild intra and extrahepatic biliary ductal dilatation similar in retrospect to the prior examination from 03/12/2015. No choledocholithiasis or obstructing  lesion identified. This may be age related. 4. Splenomegaly with portosystemic collateral vessels, including large varices adjacent to the proximal stomach. 5. Exophytic 5.6 cm simple cyst in the lateral aspect of the interpolar region of the right kidney. Electronically Signed   By: Vinnie Langton M.D.   On: 04/01/2016 10:31    Assessment and Plan:   [name] is a 83 y.o. y/o male  Who is here to follow up for cirrhosis of the liver likely NASH. He is compensated. Doing well he has gastric and esophageal varices and is on nadolol.He does not wish for an EGD for surveillance.  No ascites. USG liver shows intrahepatic biliary dilation.   Plan :  1. No ascites , no encephalopathy .  2. Low salt diet - presrntly diet rich in salt 3. Labs before his next visit in 4 months to check meld score 4. Suggest increasing dose of Nadolol to 40 mg a day if ok with his cardiologist as his pulse rate and BP still permit.  Dr [name]  MD Follow up in 4 months

## 2016-05-08 ENCOUNTER — Other Ambulatory Visit: Payer: Self-pay | Admitting: Family Medicine

## 2016-05-08 DIAGNOSIS — J449 Chronic obstructive pulmonary disease, unspecified: Secondary | ICD-10-CM | POA: Diagnosis not present

## 2016-05-13 DIAGNOSIS — Z936 Other artificial openings of urinary tract status: Secondary | ICD-10-CM | POA: Diagnosis not present

## 2016-05-18 ENCOUNTER — Other Ambulatory Visit: Payer: Self-pay | Admitting: Family Medicine

## 2016-06-07 DIAGNOSIS — J449 Chronic obstructive pulmonary disease, unspecified: Secondary | ICD-10-CM | POA: Diagnosis not present

## 2016-07-08 DIAGNOSIS — J449 Chronic obstructive pulmonary disease, unspecified: Secondary | ICD-10-CM | POA: Diagnosis not present

## 2016-07-27 ENCOUNTER — Telehealth: Payer: Self-pay

## 2016-07-27 NOTE — Telephone Encounter (Signed)
LVM for patient callback to schedule follow-up appt per Dr. Vicente Males.   Lab orders in. Advised patient to have labs drawn at [facility] prior to appointment.

## 2016-07-27 NOTE — Telephone Encounter (Signed)
-----   Message from [place], [place] sent at 04/28/2016 11:20 AM EDT ----- Regarding: Labs Due Remind patient to go get labs Schedule follow-up at this time.

## 2016-08-02 ENCOUNTER — Other Ambulatory Visit
Admission: RE | Admit: 2016-08-02 | Discharge: 2016-08-02 | Disposition: A | Discharge status: Home / Self Care | Payer: PPO | Source: Ambulatory Visit | Attending: Gastroenterology | Admitting: Gastroenterology

## 2016-08-02 DIAGNOSIS — K7460 Unspecified cirrhosis of liver: Secondary | ICD-10-CM | POA: Insufficient documentation

## 2016-08-02 DIAGNOSIS — K7581 Nonalcoholic steatohepatitis (NASH): Secondary | ICD-10-CM | POA: Insufficient documentation

## 2016-08-02 LAB — COMPREHENSIVE METABOLIC PANEL
ALK PHOS: 100 U/L (ref 38–126)
ALT: 14 U/L — AB (ref 17–63)
AST: 32 U/L (ref 15–41)
Albumin: 3.7 g/dL (ref 3.5–5.0)
Anion gap: 9 (ref 5–15)
BUN: 17 mg/dL (ref 6–20)
CALCIUM: 9.2 mg/dL (ref 8.9–10.3)
CO2: 27 mmol/L (ref 22–32)
CREATININE: 1.44 mg/dL — AB (ref 0.61–1.24)
Chloride: 107 mmol/L (ref 101–111)
GFR, EST AFRICAN AMERICAN: 51 mL/min — AB (ref >60)
GFR, EST NON AFRICAN AMERICAN: 44 mL/min — AB (ref >60)
Glucose, Bld: 117 mg/dL — ABNORMAL HIGH (ref 65–99)
Potassium: 4.1 mmol/L (ref 3.5–5.1)
SODIUM: 143 mmol/L (ref 135–145)
Total Bilirubin: 1.3 mg/dL — ABNORMAL HIGH (ref 0.3–1.2)
Total Protein: 7.2 g/dL (ref 6.5–8.1)

## 2016-08-02 LAB — CBC WITH DIFFERENTIAL/PLATELET
BASOS ABS: 0 10*3/uL (ref 0–0.1)
BASOS PCT: 1 %
EOS PCT: 5 %
Eosinophils Absolute: 0.2 10*3/uL (ref 0–0.7)
HCT: 32.2 % — ABNORMAL LOW (ref 40.0–52.0)
Hemoglobin: 10.8 g/dL — ABNORMAL LOW (ref 13.0–18.0)
Lymphocytes Relative: 23 %
Lymphs Abs: 0.8 10*3/uL — ABNORMAL LOW (ref 1.0–3.6)
MCH: 29.7 pg (ref 26.0–34.0)
MCHC: 33.5 g/dL (ref 32.0–36.0)
MCV: 88.5 fL (ref 80.0–100.0)
MONO ABS: 0.4 10*3/uL (ref 0.2–1.0)
MONOS PCT: 11 %
Neutro Abs: 2.1 10*3/uL (ref 1.4–6.5)
Neutrophils Relative %: 60 %
PLATELETS: 100 10*3/uL — AB (ref 150–440)
RBC: 3.64 MIL/uL — ABNORMAL LOW (ref 4.40–5.90)
RDW: 15.8 % — ABNORMAL HIGH (ref 11.5–14.5)
WBC: 3.5 10*3/uL — ABNORMAL LOW (ref 3.8–10.6)

## 2016-08-02 LAB — PROTIME-INR
INR: 1.35
Prothrombin Time: 16.8 seconds — ABNORMAL HIGH (ref 11.4–15.2)

## 2016-08-03 ENCOUNTER — Other Ambulatory Visit: Payer: Self-pay | Admitting: *Deleted

## 2016-08-03 ENCOUNTER — Telehealth: Payer: Self-pay | Admitting: Gastroenterology

## 2016-08-03 ENCOUNTER — Telehealth: Payer: Self-pay

## 2016-08-03 MED ORDER — MAGNESIUM OXIDE -MG SUPPLEMENT 400 (240 MG) MG PO TABS: 1.0000 | ORAL_TABLET | Freq: Every day | ORAL | 3 refills | Status: DC

## 2016-08-03 NOTE — Telephone Encounter (Signed)
results

## 2016-08-03 NOTE — Telephone Encounter (Signed)
-----   Message from [name], MD sent at 08/02/2016 10:11 AM EDT ----- Inform labs stable

## 2016-08-03 NOTE — Telephone Encounter (Signed)
Left message with daughter for patient callback for results.   Inform labs stable

## 2016-08-05 ENCOUNTER — Other Ambulatory Visit: Payer: Self-pay | Admitting: *Deleted

## 2016-08-05 NOTE — Patient Outreach (Signed)
[facility] Screening call, unsuccessful but left a message for a return call. If I do not hear back from the member I will try again within the week.  Sharmon Cheramie C. Mavis Gravelle, MSN, GNP-BC Gerontological Nurse Practitioner [facility] Care Management [phone]  

## 2016-08-06 ENCOUNTER — Other Ambulatory Visit: Payer: Self-pay | Admitting: *Deleted

## 2016-08-06 NOTE — Patient Outreach (Signed)
[facility]  2nd Screening call, unsuccessful but left a message for a return call. If I do not hear back from the member I will try again within the week.  [name]. Myrtie Neither, MSN, [facility] Gerontological Nurse Practitioner [facility] Care Management [phone]

## 2016-08-07 DIAGNOSIS — J449 Chronic obstructive pulmonary disease, unspecified: Secondary | ICD-10-CM | POA: Diagnosis not present

## 2016-08-09 ENCOUNTER — Encounter: Payer: Self-pay | Admitting: *Deleted

## 2016-08-09 ENCOUNTER — Other Ambulatory Visit: Payer: Self-pay | Admitting: *Deleted

## 2016-08-09 NOTE — Patient Outreach (Signed)
[facility] 3rd Screening call, unsuccessful but left a message for a return call. I will send her an unsuccessful letter for future reference.  [name]. Myrtie Neither, MSN, [facility] Gerontological Nurse Practitioner [facility] Care Management [phone]

## 2016-08-17 DIAGNOSIS — R0602 Shortness of breath: Secondary | ICD-10-CM | POA: Diagnosis not present

## 2016-09-06 ENCOUNTER — Other Ambulatory Visit: Payer: Self-pay | Admitting: Family Medicine

## 2016-09-07 DIAGNOSIS — J449 Chronic obstructive pulmonary disease, unspecified: Secondary | ICD-10-CM | POA: Diagnosis not present

## 2016-09-09 DIAGNOSIS — H9319 Tinnitus, unspecified ear: Secondary | ICD-10-CM | POA: Diagnosis not present

## 2016-09-09 DIAGNOSIS — H93299 Other abnormal auditory perceptions, unspecified ear: Secondary | ICD-10-CM | POA: Diagnosis not present

## 2016-09-09 DIAGNOSIS — H6123 Impacted cerumen, bilateral: Secondary | ICD-10-CM | POA: Diagnosis not present

## 2016-09-16 ENCOUNTER — Ambulatory Visit: Payer: PPO | Admitting: Family Medicine

## 2016-09-16 ENCOUNTER — Ambulatory Visit: Payer: PPO

## 2016-09-17 ENCOUNTER — Ambulatory Visit (INDEPENDENT_AMBULATORY_CARE_PROVIDER_SITE_OTHER): Payer: PPO | Admitting: Family Medicine

## 2016-09-17 ENCOUNTER — Encounter: Payer: Self-pay | Admitting: Family Medicine

## 2016-09-17 VITALS — BP 116/60 | HR 69 | Temp 98.1°F | Wt 218.0 lb

## 2016-09-17 DIAGNOSIS — K7469 Other cirrhosis of liver: Secondary | ICD-10-CM

## 2016-09-17 DIAGNOSIS — R600 Localized edema: Secondary | ICD-10-CM | POA: Diagnosis not present

## 2016-09-17 DIAGNOSIS — Z23 Encounter for immunization: Secondary | ICD-10-CM

## 2016-09-17 MED ORDER — SPIRONOLACTONE 25 MG PO TABS: 25.0000 mg | ORAL_TABLET | Freq: Every day | ORAL | 6 refills | Status: DC

## 2016-09-17 NOTE — Addendum Note (Signed)
Addended by: Pilar Grammes on: 09/17/2016 03:38 PM   Modules accepted: Orders

## 2016-09-17 NOTE — Progress Notes (Signed)
BP 116/60 (BP Location: Left Arm, Patient Position: Sitting, Cuff Size: Large)   Pulse 69   Temp 98.1 F (36.7 C) (Oral)   Wt 218 lb (98.9 kg)   SpO2 93%   BMI 34.14 kg/m    CC: 6 mo f/u visit Subjective:    Patient ID: [name], male    DOB: 03/31/1933, 83 y.o.   MRN: [id number]  HPI: [name] is a 83 y.o. male presenting on 09/17/2016 for 6 month follow-up (He checks his blood sugar at home at times.) and Immunizations (Needs 3rd Twinrix )   H/o COPD, HFpEF, cryptogenic cirrhosis, and diabetes.  Seeing Dr Vicente Males.  Ongoing leg swelling - main concern today. He is taking lasix 20mg  2 tab bid. He does have good UOP. No leg pains.   Ongoing L cheek skin lesion - has declined evaluation.   Denies fevers/chills, chest pain, dyspnea, cough, abd pain, nausea/vomiting, diarrhea, blod in stool.   3rd twinrix today.   Relevant past medical, surgical, family and social history reviewed and updated as indicated. Interim medical history since our last visit reviewed. Allergies and medications reviewed and updated. Outpatient Medications Prior to Visit  Medication Sig Dispense Refill  . acetaminophen (TYLENOL) 325 MG tablet Take 325 mg by mouth every 6 (six) hours as needed.    Jearl Klinefelter ELLIPTA 62.5-25 MCG/INH AEPB   0  . cloNIDine (CATAPRES) 0.2 MG tablet take 1 tablet by mouth twice a day 180 tablet 3  . doxazosin (CARDURA) 8 MG tablet take 1/2 tablet by mouth twice a day 90 tablet 1  . ferrous sulfate (FERRO-BOB) 325 (65 FE) MG tablet Take 1 tablet (325 mg total) by mouth daily with breakfast. (Patient taking differently: Take 325 mg by mouth daily. )    . furosemide (LASIX) 20 MG tablet Take 1 tablet (20 mg total) by mouth 2 (two) times daily. Take 40mg  in morning, 20mg  in afternoon 60 tablet 11  . glucose blood (ONE TOUCH ULTRA TEST) test strip Use to test sugar once daily and as needed Dx: E11.8 100 each 1  . losartan (COZAAR) 100 MG tablet take 1 tablet by mouth once daily 90  tablet 1  . Magnesium Oxide 400 (240 Mg) MG TABS Take 1 tablet (400 mg total) by mouth daily. 90 tablet 3  . metFORMIN (GLUCOPHAGE) 500 MG tablet take 1/2 tablet by mouth once daily with BREAKFAST 45 tablet 3  . nadolol (CORGARD) 40 MG tablet take 1 tablet by mouth once daily 90 tablet 3  . ONE TOUCH LANCETS MISC One daily and as needed/ ICD code 250.00     . pantoprazole (PROTONIX) 40 MG tablet take 1 tablet by mouth once daily 90 tablet 3  . potassium chloride (K-DUR,KLOR-CON) 10 MEQ tablet   1  . Respiratory Therapy Supplies (NEBULIZER) DEVI by Does not apply route. Use as directed     . simvastatin (ZOCOR) 20 MG tablet take 1 tablet by mouth once daily 90 tablet 3  . tiotropium (SPIRIVA) 18 MCG inhalation capsule Place 18 mcg into inhaler and inhale daily.    Marland Kitchen triamcinolone cream (KENALOG) 0.1 % Apply 1 application topically 2 (two) times daily. Apply to AA. 30 g 0  . vitamin B-12 (CYANOCOBALAMIN) 250 MCG tablet Take 250 mcg by mouth daily.     No facility-administered medications prior to visit.      Per HPI unless specifically indicated in ROS section below Review of Systems  Objective:    BP 116/60 (BP Location: Left Arm, Patient Position: Sitting, Cuff Size: Large)   Pulse 69   Temp 98.1 F (36.7 C) (Oral)   Wt 218 lb (98.9 kg)   SpO2 93%   BMI 34.14 kg/m   Wt Readings from Last 3 Encounters:  09/17/16 218 lb (98.9 kg)  04/28/16 222 lb 6.4 oz (100.9 kg)  03/25/16 218 lb (98.9 kg)    Physical Exam  Constitutional: He appears well-developed and well-nourished. No distress.  HENT:  Mouth/Throat: Oropharynx is clear and moist. No oropharyngeal exudate.  Eyes: Pupils are equal, round, and reactive to light. Conjunctivae and EOM are normal.  Cardiovascular: Normal rate, regular rhythm, normal heart sounds and intact distal pulses.   Pulmonary/Chest: Effort normal and breath sounds normal. No respiratory distress. He has no wheezes. He has no rales.  Bibasilar crackles    Abdominal: Soft. Bowel sounds are normal. He exhibits no distension and no mass. There is no tenderness. There is no rebound and no guarding.  Musculoskeletal: He exhibits edema (3+ pitting edema bilaterally).  Skin: Skin is warm and dry. No rash noted.  Poorly healing wounds L cheek, bilateral upper chest wall  Nursing note and vitals reviewed.  Results for orders placed or performed during the hospital encounter of 08/02/16  CBC with Differential/Platelet  Result Value Ref Range   WBC 3.5 (L) 3.8 - 10.6 K/uL   RBC 3.64 (L) 4.40 - 5.90 MIL/uL   Hemoglobin 10.8 (L) 13.0 - 18.0 g/dL   HCT 32.2 (L) 40.0 - 52.0 %   MCV 88.5 80.0 - 100.0 fL   MCH 29.7 26.0 - 34.0 pg   MCHC 33.5 32.0 - 36.0 g/dL   RDW 15.8 (H) 11.5 - 14.5 %   Platelets 100 (L) 150 - 440 K/uL   Neutrophils Relative % 60 %   Neutro Abs 2.1 1.4 - 6.5 K/uL   Lymphocytes Relative 23 %   Lymphs Abs 0.8 (L) 1.0 - 3.6 K/uL   Monocytes Relative 11 %   Monocytes Absolute 0.4 0.2 - 1.0 K/uL   Eosinophils Relative 5 %   Eosinophils Absolute 0.2 0 - 0.7 K/uL   Basophils Relative 1 %   Basophils Absolute 0.0 0 - 0.1 K/uL  INR/PT  Result Value Ref Range   Prothrombin Time 16.8 (H) 11.4 - 15.2 seconds   INR 1.35   Comprehensive metabolic panel  Result Value Ref Range   Sodium 143 135 - 145 mmol/L   Potassium 4.1 3.5 - 5.1 mmol/L   Chloride 107 101 - 111 mmol/L   CO2 27 22 - 32 mmol/L   Glucose, Bld 117 (H) 65 - 99 mg/dL   BUN 17 6 - 20 mg/dL   Creatinine, Ser 1.44 (H) 0.61 - 1.24 mg/dL   Calcium 9.2 8.9 - 10.3 mg/dL   Total Protein 7.2 6.5 - 8.1 g/dL   Albumin 3.7 3.5 - 5.0 g/dL   AST 32 15 - 41 U/L   ALT 14 (L) 17 - 63 U/L   Alkaline Phosphatase 100 38 - 126 U/L   Total Bilirubin 1.3 (H) 0.3 - 1.2 mg/dL   GFR calc non Af Amer 44 (L) >60 mL/min   GFR calc Af Amer 51 (L) >60 mL/min   Anion gap 9 5 - 15      Assessment & Plan:   Problem List Items Addressed This Visit    Cirrhosis, cryptogenic (HCC) - Primary     Progressive pedal edema  despite patient increasing lasix to 40mg  bid - concern for progressive liver disease. Will update labs today and add spironolactone 25mg  daily. Will back down on lasix back to 40/20mg  dose. Pt states he is having protein with each meal, recent albumin level was normal. RTC 3 mo f/u visit.  RTC 10 d lab visit only.       Relevant Orders   Renal function panel   Comprehensive metabolic panel   Leg edema    Progressive and worse. Will add spironolactone 25mg  daily. Will need to closely monitor potassium and kidney function. RTC 10d lab check.           Follow up plan: Return in about 3 months (around 12/18/2016) for follow up visit.  Ria Bush, MD

## 2016-09-17 NOTE — Assessment & Plan Note (Signed)
Progressive and worse. Will add spironolactone 25mg  daily. Will need to closely monitor potassium and kidney function. RTC 10d lab check.

## 2016-09-17 NOTE — Assessment & Plan Note (Signed)
Progressive pedal edema despite patient increasing lasix to 40mg  bid - concern for progressive liver disease. Will update labs today and add spironolactone 25mg  daily. Will back down on lasix back to 40/20mg  dose. Pt states he is having protein with each meal, recent albumin level was normal. RTC 3 mo f/u visit.  RTC 10 d lab visit only.

## 2016-09-17 NOTE — Patient Instructions (Addendum)
Labs today Decrease lasix back to 40mg /20mg   Start spironolactone 25mg  daily.  Continue potassium 30mEq daily. Return in 10 days for lab visit only to recheck kidneys and potassium.  Return in 3 months for follow up visit.  Schedule skin doctor appointment.

## 2016-09-18 LAB — COMPREHENSIVE METABOLIC PANEL
ALT: 11 U/L (ref 9–46)
AST: 22 U/L (ref 10–35)
Albumin: 3.6 g/dL (ref 3.6–5.1)
Alkaline Phosphatase: 103 U/L (ref 40–115)
BUN: 21 mg/dL (ref 7–25)
CHLORIDE: 107 mmol/L (ref 98–110)
CO2: 27 mmol/L (ref 20–32)
CREATININE: 1.48 mg/dL — AB (ref 0.70–1.11)
Calcium: 8.7 mg/dL (ref 8.6–10.3)
GLUCOSE: 86 mg/dL (ref 65–99)
POTASSIUM: 4 mmol/L (ref 3.5–5.3)
SODIUM: 142 mmol/L (ref 135–146)
Total Bilirubin: 1 mg/dL (ref 0.2–1.2)
Total Protein: 6.4 g/dL (ref 6.1–8.1)

## 2016-09-21 NOTE — Progress Notes (Signed)
Cardiology Office Note  Date:  09/22/2016   ID:  [name], DOB 1933/07/23, MRN [id number]  PCP:  Michael Bush, MD   Chief Complaint  Patient presents with  . other    6 month follow up. Meds reviewed  by the pt. verbally. Pt. c/o shortness of breath & LE edema.     HPI:  [name] is a very pleasant 83 yo gentleman with a long history of  smoking, suspected COPD,  no known coronary artery disease,  May 2012 with respiratory distress,  right lower lobe pneumonia,  Persistent atrial fibrillation, Not on anticoagulation secondary to GI bleeding,  patient does not want anticoagulation [name] 2013 for right upper lobe pneumonia.  GI bleed June 2013,  gastric varices, AV malformations on upper GI,  Admission 09/12/2011 for COPD exacerbation. chronic diastolic CHF He presents for routine followup today of his atrial fibrillation  In follow-up, reports having continued leg swelling right greater than left Daughter presents with him, reports it is affecting his ability to walk at times Sometimes has severe foot swelling Does not like wearing compression hose, does minimal leg elevation  Lab work reviewed on today's visit Creatinine stable 1.48, no change since February 2018  Chronic mild shortness of breath, no significant change Previously reported having high fluid intake   Chronic nerve pain in his legs, sedentary, significant chronic leg weakness   again on today's visit, Discussed anticoagulation with him again, he is not interested in starting a blood thinner  EKG today showing atrial fibrillation with rate of 57 bpm, nonspecific T-wave abnormality  Other past medical history reviewed In 2017 had periodic hypoxia, increased shortness of breath, oxygen levels and the 80s, high 70s with walking, seen by primary care. At that time was recommended to increase Lasix up to 40 mill grams twice a day for 5 days He had improvement of his symptoms  not on anticoagulation  secondary to history of GI bleeding No GI bleeding seen by GI February 2016 and February 2015 In the past it was felt he was high risk of bleeding given gastric varices and AV malformations  Previous total cholesterol 104, LDL 53, hemoglobin A1c 5.9  Other past medical history episode of atrial fibrillation in December 2013.  He did not restart his anticoagulation as it was very expensive and he was in the donut hole. He is also concerned about recurrent GI bleed. Previous leg edema on amlodipine  Previous hospital admission June 27 2011 with GI bleed. Upper endoscopy Showed angiodysplasias with ablation, gastric varices. He was started on proton pump inhibitor. He was also treated for C. difficile . Hematocrit reached low 20s on admission. his anticoagulation was held at discharge . His dose of Coreg was also significantly decreased at discharge from 25 mg to 6.25 mg twice a day.  Echocardiogram May 2013 shows normal LV systolic function, greater than 55%, mildly dilated left atrium, no diastolic dysfunction   PMH:   has a past medical history of Arthritis; Atrial fibrillation ([place]) (06/23/2010); B12 deficiency; C. difficile colitis (07/06/2011); CHF (congestive heart failure) ([place]); Chronic a-fib (HCC); COPD (chronic obstructive pulmonary disease) ([place]); Cryptogenic cirrhosis ([place]); Diabetes mellitus, type 2 ([place]); Gastric and duodenal angiodysplasia (06/28/2011); Gastric varices; GI bleed; Hemorrhoids; History of bladder cancer (1984); History of ileostomy; History of pneumonia (2012); Hyperlipidemia; Hypertension; Inguinal hernia; Iron deficiency anemia; Kidney atrophy (02/2016); Pinched nerve; Pyloric stenosis; RLL pneumonia ([place]) (01/28/2014); and Shortness of breath.  PSH:    Past Surgical History:  Procedure Laterality Date  . BE  08/08/2003  . BLADDER REMOVAL     s/p urostomy bag  . COLONOSCOPY  07/24/2003   Int. hemorrhoids  . COLONOSCOPY  10/26/2006   Int hemorrhoids, 10  yrs  . ESOPHAGOGASTRODUODENOSCOPY  10/26/2006   Gastric varices, pyloric stenosis  . ESOPHAGOGASTRODUODENOSCOPY  07/01/2011   Procedure: ESOPHAGOGASTRODUODENOSCOPY (EGD);  Surgeon: Michael Bears, MD;  Location: [facility];  Service: Gastroenterology;  Laterality: N/A;  . ESOPHAGOGASTRODUODENOSCOPY  06/28/2011   Procedure: ESOPHAGOGASTRODUODENOSCOPY (EGD);  Surgeon: Michael Bears, MD;  Location: [facility];  Service: Gastroenterology;  Laterality: N/A;  . FETAL BLOOD TRANSFUSION    . ILEOSTOMY  1984   bladder cancer  . PENILE PROSTHESIS IMPLANT  1990   inflatable implant  . PROSTATECTOMY  1984   bladder cancer  . US ECHOCARDIOGRAPHY  05/1995   EF 60% TR, AI    Current Outpatient Prescriptions  Medication Sig Dispense Refill  . acetaminophen (TYLENOL) 325 MG tablet Take 325 mg by mouth every 6 (six) hours as needed.    [name] ELLIPTA 62.5-25 MCG/INH AEPB   0  . cloNIDine (CATAPRES) 0.2 MG tablet take 1 tablet by mouth twice a day 180 tablet 3  . doxazosin (CARDURA) 8 MG tablet take 1/2 tablet by mouth twice a day 90 tablet 1  . ferrous sulfate (FERRO-BOB) 325 (65 FE) MG tablet Take 1 tablet (325 mg total) by mouth daily with breakfast. (Patient taking differently: Take 325 mg by mouth daily. )    . furosemide (LASIX) 20 MG tablet Take 1 tablet (20 mg total) by mouth 2 (two) times daily. Take 40mg  in morning, 20mg  in afternoon 60 tablet 11  . glucose blood (ONE TOUCH ULTRA TEST) test strip Use to test sugar once daily and as needed Dx: E11.8 100 each 1  . losartan (COZAAR) 100 MG tablet take 1 tablet by mouth once daily 90 tablet 1  . Magnesium Oxide 400 (240 Mg) MG TABS Take 1 tablet (400 mg total) by mouth daily. 90 tablet 3  . metFORMIN (GLUCOPHAGE) 500 MG tablet take 1/2 tablet by mouth once daily with BREAKFAST 45 tablet 3  . nadolol (CORGARD) 40 MG tablet take 1 tablet by mouth once daily 90 tablet 3  . ONE TOUCH LANCETS MISC One daily and as needed/ ICD code 250.00     .  pantoprazole (PROTONIX) 40 MG tablet take 1 tablet by mouth once daily 90 tablet 3  . potassium chloride (K-DUR,KLOR-CON) 10 MEQ tablet Take 10 mEq by mouth daily.   1  . Respiratory Therapy Supplies (NEBULIZER) DEVI by Does not apply route. Use as directed     . simvastatin (ZOCOR) 20 MG tablet take 1 tablet by mouth once daily 90 tablet 3  . spironolactone (ALDACTONE) 25 MG tablet Take 1 tablet (25 mg total) by mouth daily. 30 tablet 6  . tiotropium (SPIRIVA) 18 MCG inhalation capsule Place 18 mcg into inhaler and inhale daily.    Marland Kitchen triamcinolone cream (KENALOG) 0.1 % Apply 1 application topically 2 (two) times daily. Apply to AA. 30 g 0  . vitamin B-12 (CYANOCOBALAMIN) 250 MCG tablet Take 250 mcg by mouth daily.     No current facility-administered medications for this visit.      Allergies:   Patient has no known allergies.   Social History:  The patient  reports that he quit smoking about 27 years ago. His smoking use included Cigarettes. He has a 45.00 pack-year smoking history.  He has never used smokeless tobacco. He reports that he does not drink alcohol or use drugs.   Family History:   family history includes Coronary artery disease in his sister and sister; Diabetes in his brother; Heart disease in his brother and father; Hypertension in his mother; Osteoarthritis in his mother; Stroke in his other.    Review of Systems: Review of Systems  Constitutional: Negative.   Respiratory: Positive for shortness of breath.   Cardiovascular: Positive for leg swelling.  Gastrointestinal: Negative.   Musculoskeletal: Negative.        Gait instability  Neurological: Negative.   Psychiatric/Behavioral: Negative.   All other systems reviewed and are negative.    PHYSICAL EXAM: VS:  BP 120/62 (BP Location: Left Arm, Patient Position: Sitting, Cuff Size: Normal)   Pulse (!) 58   Ht 5\' 7"  (1.702 m)   Wt 220 lb 8 oz (100 kg)   BMI 34.54 kg/m  , BMI Body mass index is 34.54 kg/m. GEN:  Well nourished, well developed, in no acute distress , obese HEENT: normal  Neck: no JVD, carotid bruits, or masses Cardiac:irregularly irregular,no murmurs, rubs, or gallops,  Significant lower extremity edema , minimal pitting Respiratory:  clear to auscultation bilaterally, normal work of breathing GI: soft, nontender, nondistended, + BS MS: no deformity or atrophy  Skin: warm and dry, no rash Neuro:  Strength and sensation are intact Psych: euthymic mood, full affect    Recent Labs: 03/19/2016: TSH 4.17 08/02/2016: Hemoglobin 10.8; Platelets 100 09/17/2016: ALT 11; BUN 21; Creat 1.48; Potassium 4.0; Sodium 142    Lipid Panel Lab Results  Component Value Date   CHOL 104 03/05/2015   HDL 31.80 (L) 03/05/2015   LDLCALC 53 03/05/2015   TRIG 96.0 03/05/2015      Wt Readings from Last 3 Encounters:  09/22/16 220 lb 8 oz (100 kg)  09/17/16 218 lb (98.9 kg)  04/28/16 222 lb 6.4 oz (100.9 kg)       ASSESSMENT AND PLAN:  Hyperlipidemia, unspecified hyperlipidemia type - Plan: EKG 12-Lead Currently on simvastatin Cholesterol is at goal on the current lipid regimen. No changes to the medications were made.  Essential hypertension - Plan: EKG 12-Lead Blood pressure is well controlled on today's visit. No changes made to the medications.  Chronic diastolic congestive heart failure ([place]) - Plan: EKG 12-Lead Long discussion with him concerning fluid management Likely prerenal state, Leg edema secondary to venous or lymph edema Minimal improvement with diuretics  Atrial fibrillation, unspecified type ([employer]) - Plan: EKG 12-Lead Rate well controlled, Patient has declined anticoagulation given history of GI bleeding in the past  Leg edema/swelling Recommended he consider appointment with vein and vascular Would suggest compression hose, better leg elevation   Total encounter time more than 25 minutes  Greater than 50% was spent in counseling and coordination of care with the  patient   Disposition:   F/U  12 months   Orders Placed This Encounter  Procedures  . EKG 12-Lead     Signed, Esmond Plants, M.D., Ph.D. 09/22/2016  [place], [place]

## 2016-09-22 ENCOUNTER — Ambulatory Visit (INDEPENDENT_AMBULATORY_CARE_PROVIDER_SITE_OTHER): Payer: PPO | Admitting: Cardiovascular Disease

## 2016-09-22 ENCOUNTER — Telehealth: Payer: Self-pay | Admitting: Family Medicine

## 2016-09-22 ENCOUNTER — Encounter: Payer: Self-pay | Admitting: Cardiovascular Disease

## 2016-09-22 VITALS — BP 120/62 | HR 58 | Ht 67.0 in | Wt 220.5 lb

## 2016-09-22 DIAGNOSIS — E118 Type 2 diabetes mellitus with unspecified complications: Secondary | ICD-10-CM

## 2016-09-22 DIAGNOSIS — I10 Essential (primary) hypertension: Secondary | ICD-10-CM

## 2016-09-22 DIAGNOSIS — E782 Mixed hyperlipidemia: Secondary | ICD-10-CM | POA: Diagnosis not present

## 2016-09-22 DIAGNOSIS — I481 Persistent atrial fibrillation: Secondary | ICD-10-CM | POA: Diagnosis not present

## 2016-09-22 DIAGNOSIS — I5032 Chronic diastolic (congestive) heart failure: Secondary | ICD-10-CM | POA: Diagnosis not present

## 2016-09-22 DIAGNOSIS — I4819 Other persistent atrial fibrillation: Secondary | ICD-10-CM

## 2016-09-22 NOTE — Telephone Encounter (Signed)
See results note. 

## 2016-09-22 NOTE — Patient Instructions (Signed)

## 2016-09-22 NOTE — Telephone Encounter (Signed)
Patient's daughter,Jennifer,returned [name]'s call about patient's lab work.

## 2016-09-28 ENCOUNTER — Other Ambulatory Visit: Payer: Self-pay | Admitting: Family Medicine

## 2016-09-28 ENCOUNTER — Other Ambulatory Visit (INDEPENDENT_AMBULATORY_CARE_PROVIDER_SITE_OTHER): Payer: PPO

## 2016-09-28 DIAGNOSIS — K7469 Other cirrhosis of liver: Secondary | ICD-10-CM | POA: Diagnosis not present

## 2016-09-28 LAB — RENAL FUNCTION PANEL
Albumin: 3.4 g/dL — ABNORMAL LOW (ref 3.5–5.2)
BUN: 17 mg/dL (ref 6–23)
CHLORIDE: 107 meq/L (ref 96–112)
CO2: 29 mEq/L (ref 19–32)
CREATININE: 1.34 mg/dL (ref 0.40–1.50)
Calcium: 9.1 mg/dL (ref 8.4–10.5)
GFR: 54.34 mL/min — AB (ref >60.00)
Glucose, Bld: 186 mg/dL — ABNORMAL HIGH (ref 70–99)
PHOSPHORUS: 2.9 mg/dL (ref 2.3–4.6)
Potassium: 4.5 mEq/L (ref 3.5–5.1)
SODIUM: 142 meq/L (ref 135–145)

## 2016-09-30 ENCOUNTER — Encounter: Payer: Self-pay | Admitting: *Deleted

## 2016-10-08 DIAGNOSIS — J449 Chronic obstructive pulmonary disease, unspecified: Secondary | ICD-10-CM | POA: Diagnosis not present

## 2016-10-12 DIAGNOSIS — Z936 Other artificial openings of urinary tract status: Secondary | ICD-10-CM | POA: Diagnosis not present

## 2016-10-15 ENCOUNTER — Encounter (INDEPENDENT_AMBULATORY_CARE_PROVIDER_SITE_OTHER): Payer: Self-pay | Admitting: Vascular Surgery

## 2016-10-15 ENCOUNTER — Ambulatory Visit (INDEPENDENT_AMBULATORY_CARE_PROVIDER_SITE_OTHER): Payer: PPO | Admitting: Vascular Surgery

## 2016-10-15 VITALS — BP 130/54 | HR 63 | Resp 18 | Ht 68.0 in | Wt 221.0 lb

## 2016-10-15 DIAGNOSIS — R29898 Other symptoms and signs involving the musculoskeletal system: Secondary | ICD-10-CM

## 2016-10-15 DIAGNOSIS — E782 Mixed hyperlipidemia: Secondary | ICD-10-CM | POA: Diagnosis not present

## 2016-10-15 DIAGNOSIS — K7469 Other cirrhosis of liver: Secondary | ICD-10-CM | POA: Diagnosis not present

## 2016-10-15 DIAGNOSIS — E118 Type 2 diabetes mellitus with unspecified complications: Secondary | ICD-10-CM

## 2016-10-15 DIAGNOSIS — I10 Essential (primary) hypertension: Secondary | ICD-10-CM | POA: Diagnosis not present

## 2016-10-15 DIAGNOSIS — R600 Localized edema: Secondary | ICD-10-CM | POA: Diagnosis not present

## 2016-10-15 NOTE — Progress Notes (Signed)
Subjective:    Patient ID: [name], male    DOB: 08-15-1933, 83 y.o.   MRN: [id number] Chief Complaint  Patient presents with  . New Patient (Initial Visit)    Bilateral leg swelling   Presents at the request of Dr. Rockey Situ for bilateral lower extremity edema. Seen with daughter. Patient reports a three to four months of worsening bilateral lower extremity edema. Patient informs he has always had swelling to the lower extremity however it has recently worsened. He has tried increasing his "water pills" with minimal improvement in the edema in his lower extremities. He denies any claudication, rest pain or ulceration to the lower extremity however does experience weakness to the lower extremity. At this time, he does not wear medical grade 1 compression stockings or elevate his legs heart level or higher. Denies any fever, nausea or vomiting.   Review of Systems  Constitutional: Negative.   HENT: Negative.   Eyes: Negative.   Respiratory: Negative.   Cardiovascular: Positive for leg swelling.  Gastrointestinal: Negative.   Endocrine: Negative.   Genitourinary: Negative.   Musculoskeletal: Negative.   Skin: Negative.   Allergic/Immunologic: Negative.   Neurological: Negative.   Hematological: Negative.   Psychiatric/Behavioral: Negative.       Objective:   Physical Exam  Constitutional: He is oriented to person, place, and time. He appears well-developed and well-nourished. No distress.  HENT:  Head: Normocephalic and atraumatic.  Eyes: Pupils are equal, round, and reactive to light. Conjunctivae are normal.  Neck: Normal range of motion.  Cardiovascular: Normal rate, regular rhythm, normal heart sounds and intact distal pulses.   Pulses:      Radial pulses are 2+ on the right side, and 2+ on the left side.  Unable to palpate pedal pulses due to edema and body habitus however bilateral feet are warm.  Pulmonary/Chest: Effort normal.  Abdominal:  Urostomy bag in place    Musculoskeletal: He exhibits edema (Moderate lower extremity edema, right greater than left).  Neurological: He is alert and oriented to person, place, and time.  Skin: Skin is warm and dry. He is not diaphoretic.  Scattered less than 1 cm varicose veins bilaterally  Psychiatric: He has a normal mood and affect. His behavior is normal. Judgment and thought content normal.  Vitals reviewed.  BP (!) 130/54 (BP Location: Right Arm)   Pulse 63   Resp 18   Ht 5\' 8"  (1.727 m)   Wt 221 lb (100.2 kg)   BMI 33.60 kg/m   Past Medical History:  Diagnosis Date  . Arthritis   . Atrial fibrillation ([place]) 06/23/2010  . B12 deficiency   . C. difficile colitis 07/06/2011  . CHF (congestive heart failure) (HCC)    diastolic. Echo 5/10 with mild LVH, EF 53%, grade 1 diastolic dysfunction, severe left atrial enlargement, aortic sclerosis  . Chronic a-fib (HCC)    on pradaxa, declined cardioversion in past  . COPD (chronic obstructive pulmonary disease) ([place])    (Dr. Raul Del)  . Cryptogenic cirrhosis ([place])    Followed by Dr Fuller Plan, never biopsied, has been stable.  History of gastric varices.  . Diabetes mellitus, type 2 ([place])   . Gastric and duodenal angiodysplasia 06/28/2011  . Gastric varices   . GI bleed   . Hemorrhoids   . History of bladder cancer 1984   s/p ileal conduit  . History of ileostomy   . History of pneumonia 2012  . Hyperlipidemia   . Hypertension   .  Inguinal hernia    left  . Iron deficiency anemia   . Kidney atrophy 02/2016  . Pinched nerve    back  . Pyloric stenosis    mild EGD 10/26/06  . RLL pneumonia ([place]) 01/28/2014  . Shortness of breath     Social History   Social History  . Marital status: Married    Spouse name: N/A  . Number of children: 7  . Years of education: N/A   Occupational History  . trucking, retired    Social History Main Topics  . Smoking status: Former Smoker    Packs/day: 1.00    Years: 45.00    Types: Cigarettes    Quit date:  06/22/1989  . Smokeless tobacco: Never Used     Comment: Quit 1990  . Alcohol use No  . Drug use: No  . Sexual activity: Not Currently   Other Topics Concern  . Not on file   Social History Narrative   Daily caffeine use - 1-2 cups/day coffee   Lives with wife.     7 children   Occ: retired, was Administrator.   Activity: no regular exercise   Diet: good water, fruits/vegetables daily    Past Surgical History:  Procedure Laterality Date  . BE  08/08/2003  . BLADDER REMOVAL     s/p urostomy bag  . COLONOSCOPY  07/24/2003   Int. hemorrhoids  . COLONOSCOPY  10/26/2006   Int hemorrhoids, 10 yrs  . ESOPHAGOGASTRODUODENOSCOPY  10/26/2006   Gastric varices, pyloric stenosis  . ESOPHAGOGASTRODUODENOSCOPY  07/01/2011   Procedure: ESOPHAGOGASTRODUODENOSCOPY (EGD);  Surgeon: Jerene Bears, MD;  Location: [facility];  Service: Gastroenterology;  Laterality: N/A;  . ESOPHAGOGASTRODUODENOSCOPY  06/28/2011   Procedure: ESOPHAGOGASTRODUODENOSCOPY (EGD);  Surgeon: Jerene Bears, MD;  Location: [facility];  Service: Gastroenterology;  Laterality: N/A;  . FETAL BLOOD TRANSFUSION    . ILEOSTOMY  1984   bladder cancer  . PENILE PROSTHESIS IMPLANT  1990   inflatable implant  . PROSTATECTOMY  1984   bladder cancer  . US ECHOCARDIOGRAPHY  05/1995   EF 60% TR, AI    Family History  Problem Relation Age of Onset  . Hypertension Mother   . Osteoarthritis Mother   . Heart disease Father        MI  . Coronary artery disease Sister   . Diabetes Brother   . Heart disease Brother        MI  . Coronary artery disease Sister   . Stroke Other        GPs  . Colon cancer Neg Hx     No Known Allergies     Assessment & Plan:  Presents at the request of Dr. Rockey Situ for bilateral lower extremity edema. Seen with daughter. Patient reports a three to four months of worsening bilateral lower extremity edema. Patient informs he has always had swelling to the lower extremity however it has recently  worsened. He has tried increasing his "water pills" with minimal improvement in the edema in his lower extremities. He denies any claudication, rest pain or ulceration to the lower extremity however does experience weakness to the lower extremity. At this time, he does not wear medical grade 1 compression stockings or elevate his legs heart level or higher. Denies any fever, nausea or vomiting.  1. Leg edema - New Patient with worsening edema to the bilateral lower extremity. The patient was encouraged to wear graduated compression stockings (20-30 mmHg) on a daily basis.  The patient was instructed to begin wearing the stockings first thing in the morning and removing them in the evening. The patient was instructed specifically not to sleep in the stockings. Prescription given. In addition, behavioral modification including elevation during the day will be initiated. The patient was advised to follow up in one month after wearing compression stockings daily with elevation. The patient was instructed to call the office in the interim if any worsening edema or ulcerations to the legs, feet or toes occurs. The patient expresses their understanding.  - VAS [place] LOWER EXTREMITY VENOUS REFLUX; Future  2. Bilateral leg weakness - Stable Patient with bilateral lower extremity weakness. Most likely neurological in nature however will rule out out any peripheral artery disease contributing with ABI Patient with multiple risk factors for peripheral artery disease  - VAS [place] ABI WITH/WO TBI; Future  3. Essential hypertension - stable Encouraged good control as its slows the progression of atherosclerotic disease  4. Cirrhosis, cryptogenic (HCC) - stable Contributing factor to her lower extremity edema  5. Controlled type 2 diabetes mellitus with complication, without long-term current use of insulin (HCC) - stable Encouraged good control as its slows the progression of atherosclerotic disease  6. Mixed  hyperlipidemia - stable Encouraged good control as its slows the progression of atherosclerotic disease  Current Outpatient Prescriptions on File Prior to Visit  Medication Sig Dispense Refill  . acetaminophen (TYLENOL) 325 MG tablet Take 325 mg by mouth every 6 (six) hours as needed.    Jearl Klinefelter ELLIPTA 62.5-25 MCG/INH AEPB   0  . cloNIDine (CATAPRES) 0.2 MG tablet take 1 tablet by mouth twice a day 180 tablet 3  . doxazosin (CARDURA) 8 MG tablet take 1/2 tablet by mouth twice a day 90 tablet 1  . ferrous sulfate (FERRO-BOB) 325 (65 FE) MG tablet Take 1 tablet (325 mg total) by mouth daily with breakfast. (Patient taking differently: Take 325 mg by mouth daily. )    . furosemide (LASIX) 20 MG tablet Take 1 tablet (20 mg total) by mouth 2 (two) times daily. Take 40mg  in morning, 20mg  in afternoon 60 tablet 11  . glucose blood (ONE TOUCH ULTRA TEST) test strip Use to test sugar once daily and as needed Dx: E11.8 100 each 1  . losartan (COZAAR) 100 MG tablet take 1 tablet by mouth once daily 90 tablet 1  . Magnesium Oxide 400 (240 Mg) MG TABS Take 1 tablet (400 mg total) by mouth daily. 90 tablet 3  . metFORMIN (GLUCOPHAGE) 500 MG tablet take 1/2 tablet by mouth once daily with BREAKFAST 45 tablet 3  . nadolol (CORGARD) 40 MG tablet take 1 tablet by mouth once daily 90 tablet 3  . ONE TOUCH LANCETS MISC One daily and as needed/ ICD code 250.00     . pantoprazole (PROTONIX) 40 MG tablet take 1 tablet by mouth once daily 90 tablet 3  . potassium chloride (K-DUR,KLOR-CON) 10 MEQ tablet Take 10 mEq by mouth daily.   1  . Respiratory Therapy Supplies (NEBULIZER) DEVI by Does not apply route. Use as directed     . simvastatin (ZOCOR) 20 MG tablet take 1 tablet by mouth once daily 90 tablet 3  . spironolactone (ALDACTONE) 25 MG tablet Take 1 tablet (25 mg total) by mouth daily. 30 tablet 6  . tiotropium (SPIRIVA) 18 MCG inhalation capsule Place 18 mcg into inhaler and inhale daily.    Marland Kitchen triamcinolone  cream (KENALOG) 0.1 % Apply 1 application topically  2 (two) times daily. Apply to AA. 30 g 0  . vitamin B-12 (CYANOCOBALAMIN) 250 MCG tablet Take 250 mcg by mouth daily.     No current facility-administered medications on file prior to visit.    There are no Patient Instructions on file for this visit. No Follow-up on file.  Hokulani Rogel A Danee Soller, PA-C

## 2016-11-07 DIAGNOSIS — J449 Chronic obstructive pulmonary disease, unspecified: Secondary | ICD-10-CM | POA: Diagnosis not present

## 2016-11-16 ENCOUNTER — Other Ambulatory Visit: Payer: Self-pay | Admitting: Family Medicine

## 2016-11-26 ENCOUNTER — Ambulatory Visit (INDEPENDENT_AMBULATORY_CARE_PROVIDER_SITE_OTHER): Payer: PPO

## 2016-11-26 ENCOUNTER — Encounter (INDEPENDENT_AMBULATORY_CARE_PROVIDER_SITE_OTHER): Payer: Self-pay | Admitting: Vascular Surgery

## 2016-11-26 ENCOUNTER — Ambulatory Visit (INDEPENDENT_AMBULATORY_CARE_PROVIDER_SITE_OTHER): Payer: PPO | Admitting: Vascular Surgery

## 2016-11-26 VITALS — BP 114/47 | HR 69 | Resp 16 | Ht 67.0 in | Wt 212.0 lb

## 2016-11-26 DIAGNOSIS — E782 Mixed hyperlipidemia: Secondary | ICD-10-CM | POA: Diagnosis not present

## 2016-11-26 DIAGNOSIS — R29898 Other symptoms and signs involving the musculoskeletal system: Secondary | ICD-10-CM

## 2016-11-26 DIAGNOSIS — I83893 Varicose veins of bilateral lower extremities with other complications: Secondary | ICD-10-CM

## 2016-11-26 DIAGNOSIS — I10 Essential (primary) hypertension: Secondary | ICD-10-CM

## 2016-11-26 DIAGNOSIS — R600 Localized edema: Secondary | ICD-10-CM | POA: Diagnosis not present

## 2016-11-26 DIAGNOSIS — E118 Type 2 diabetes mellitus with unspecified complications: Secondary | ICD-10-CM | POA: Diagnosis not present

## 2016-11-26 DIAGNOSIS — K7469 Other cirrhosis of liver: Secondary | ICD-10-CM | POA: Diagnosis not present

## 2016-11-26 NOTE — Assessment & Plan Note (Signed)
blood pressure control important in reducing the progression of atherosclerotic disease. On appropriate oral medications.  

## 2016-11-26 NOTE — Assessment & Plan Note (Signed)
lipid control important in reducing the progression of atherosclerotic disease. Continue statin therapy  

## 2016-11-26 NOTE — Patient Instructions (Signed)

## 2016-11-26 NOTE — Assessment & Plan Note (Signed)
blood glucose control important in reducing the progression of atherosclerotic disease. Also, involved in wound healing. On appropriate medications.  

## 2016-11-26 NOTE — Assessment & Plan Note (Signed)
Could certainly worsen his LE swelling

## 2016-11-26 NOTE — Assessment & Plan Note (Signed)
His noninvasive studies today demonstrate normal ABIs of 1.3 on the right and 1.1 on the left with brisk normal triphasic waveforms consistent with no arterial insufficiency venous duplex today demonstrates no evidence of deep venous thrombosis or superficial thrombophlebitis.  There is no deep venous reflux.  There was reflux in both great saphenous veins and the left small saphenous vein Given his symptoms of venous insufficiency, I would recommend he continue conservative therapy with compression stockings, leg elevation, and increasing his we will try this for 2-3 more months to see how he does with this conservative therapy.  He has had slight improvement, but not significant I will plan to see him back in 2-3 months for follow-up.

## 2016-11-26 NOTE — Progress Notes (Signed)
MRN : [id number]  [name] is a 83 y.o. (16-Feb-1933) male who presents with chief complaint of  Chief Complaint  Patient presents with  . Follow-up    58mo abi,bil ven reflux  .  History of Present Illness: Patient returns today in follow up of leg swelling and heaviness.  His swelling is gone down a little bit with the use of compression stockings although it still remains prominent.  He has no new ulceration or infection.  His noninvasive studies today demonstrate normal ABIs of 1.3 on the right and 1.1 on the left with brisk normal triphasic waveforms consistent with no arterial insufficiency venous duplex today demonstrates no evidence of deep venous thrombosis or superficial thrombophlebitis.  There is no deep venous reflux.  There was reflux in both great saphenous veins and the left small saphenous vein  Current Outpatient Prescriptions  Medication Sig Dispense Refill  . acetaminophen (TYLENOL) 325 MG tablet Take 325 mg by mouth every 6 (six) hours as needed.    Jearl Klinefelter ELLIPTA 62.5-25 MCG/INH AEPB   0  . cloNIDine (CATAPRES) 0.2 MG tablet take 1 tablet by mouth twice a day 180 tablet 3  . doxazosin (CARDURA) 8 MG tablet take 1/2 tablet by mouth twice a day 90 tablet 1  . ferrous sulfate (FERRO-BOB) 325 (65 FE) MG tablet Take 1 tablet (325 mg total) by mouth daily with breakfast. (Patient taking differently: Take 325 mg by mouth daily. )    . furosemide (LASIX) 20 MG tablet Take 1 tablet (20 mg total) by mouth 2 (two) times daily. Take 40mg  in morning, 20mg  in afternoon 60 tablet 11  . glucose blood (ONE TOUCH ULTRA TEST) test strip Use to test sugar once daily and as needed Dx: E11.8 100 each 1  . losartan (COZAAR) 100 MG tablet take 1 tablet by mouth once daily 90 tablet 0  . Magnesium Oxide 400 (240 Mg) MG TABS Take 1 tablet (400 mg total) by mouth daily. 90 tablet 3  . metFORMIN (GLUCOPHAGE) 500 MG tablet take 1/2 tablet by mouth once daily with BREAKFAST 45 tablet 3  .  nadolol (CORGARD) 40 MG tablet take 1 tablet by mouth once daily 90 tablet 3  . ONE TOUCH LANCETS MISC One daily and as needed/ ICD code 250.00     . pantoprazole (PROTONIX) 40 MG tablet take 1 tablet by mouth once daily 90 tablet 3  . potassium chloride (K-DUR,KLOR-CON) 10 MEQ tablet Take 10 mEq by mouth daily.   1  . Respiratory Therapy Supplies (NEBULIZER) DEVI by Does not apply route. Use as directed     . simvastatin (ZOCOR) 20 MG tablet take 1 tablet by mouth once daily 90 tablet 3  . spironolactone (ALDACTONE) 25 MG tablet Take 1 tablet (25 mg total) by mouth daily. 30 tablet 6  . tiotropium (SPIRIVA) 18 MCG inhalation capsule Place 18 mcg into inhaler and inhale daily.    Marland Kitchen triamcinolone cream (KENALOG) 0.1 % Apply 1 application topically 2 (two) times daily. Apply to AA. 30 g 0  . vitamin B-12 (CYANOCOBALAMIN) 250 MCG tablet Take 250 mcg by mouth daily.     No current facility-administered medications for this visit.     Past Medical History:  Diagnosis Date  . Arthritis   . Atrial fibrillation ([place]) 06/23/2010  . B12 deficiency   . C. difficile colitis 07/06/2011  . CHF (congestive heart failure) (HCC)    diastolic. Echo 5/10 with mild LVH, EF  22%, grade 1 diastolic dysfunction, severe left atrial enlargement, aortic sclerosis  . Chronic a-fib (HCC)    on pradaxa, declined cardioversion in past  . COPD (chronic obstructive pulmonary disease) ([place])    (Dr. Raul Del)  . Cryptogenic cirrhosis ([place])    Followed by Dr Fuller Plan, never biopsied, has been stable.  History of gastric varices.  . Diabetes mellitus, type 2 ([place])   . Gastric and duodenal angiodysplasia 06/28/2011  . Gastric varices   . GI bleed   . Hemorrhoids   . History of bladder cancer 1984   s/p ileal conduit  . History of ileostomy   . History of pneumonia 2012  . Hyperlipidemia   . Hypertension   . Inguinal hernia    left  . Iron deficiency anemia   . Kidney atrophy 02/2016  . Pinched nerve    back  .  Pyloric stenosis    mild EGD 10/26/06  . RLL pneumonia ([place]) 01/28/2014  . Shortness of breath     Past Surgical History:  Procedure Laterality Date  . BE  08/08/2003  . BLADDER REMOVAL     s/p urostomy bag  . COLONOSCOPY  07/24/2003   Int. hemorrhoids  . COLONOSCOPY  10/26/2006   Int hemorrhoids, 10 yrs  . ESOPHAGOGASTRODUODENOSCOPY  10/26/2006   Gastric varices, pyloric stenosis  . ESOPHAGOGASTRODUODENOSCOPY  07/01/2011   Procedure: ESOPHAGOGASTRODUODENOSCOPY (EGD);  Surgeon: Jerene Bears, MD;  Location: [facility];  Service: Gastroenterology;  Laterality: N/A;  . ESOPHAGOGASTRODUODENOSCOPY  06/28/2011   Procedure: ESOPHAGOGASTRODUODENOSCOPY (EGD);  Surgeon: Jerene Bears, MD;  Location: [facility];  Service: Gastroenterology;  Laterality: N/A;  . FETAL BLOOD TRANSFUSION    . ILEOSTOMY  1984   bladder cancer  . PENILE PROSTHESIS IMPLANT  1990   inflatable implant  . PROSTATECTOMY  1984   bladder cancer  . US ECHOCARDIOGRAPHY  05/1995   EF 60% TR, AI    Social History Social History  Substance Use Topics  . Smoking status: Former Smoker    Packs/day: 1.00    Years: 45.00    Types: Cigarettes    Quit date: 06/22/1989  . Smokeless tobacco: Never Used     Comment: Quit 1990  . Alcohol use No    Family History Family History  Problem Relation Age of Onset  . Hypertension Mother   . Osteoarthritis Mother   . Heart disease Father        MI  . Coronary artery disease Sister   . Diabetes Brother   . Heart disease Brother        MI  . Coronary artery disease Sister   . Stroke Other        GPs  . Colon cancer Neg Hx     No Known Allergies   REVIEW OF SYSTEMS (Negative unless checked)  Constitutional: [] Weight loss  [] Fever  [] Chills Cardiac: [] Chest pain   [] Chest pressure   [x] Palpitations   [] Shortness of breath when laying flat   [] Shortness of breath at rest   [x] Shortness of breath with exertion. Vascular:  [] Pain in legs with walking   [x] Pain in legs at  rest   [] Pain in legs when laying flat   [] Claudication   [] Pain in feet when walking  [] Pain in feet at rest  [] Pain in feet when laying flat   [] History of DVT   [] Phlebitis   [x] Swelling in legs   [] Varicose veins   [] Non-healing ulcers Pulmonary:   [] Uses home oxygen   []   Productive cough   [] Hemoptysis   [] Wheeze  [] COPD   [] Asthma Neurologic:  [] Dizziness  [] Blackouts   [] Seizures   [] History of stroke   [] History of TIA  [] Aphasia   [] Temporary blindness   [] Dysphagia   [] Weakness or numbness in arms   [] Weakness or numbness in legs Musculoskeletal:  [] Arthritis   [] Joint swelling   [] Joint pain   [] Low back pain Hematologic:  [] Easy bruising  [] Easy bleeding   [] Hypercoagulable state   [] Anemic   Gastrointestinal:  [] Blood in stool   [] Vomiting blood  [] Gastroesophageal reflux/heartburn   [] Abdominal pain Genitourinary:  [x] Chronic kidney disease   [] Difficult urination  [] Frequent urination  [] Burning with urination   [] Hematuria Skin:  [] Rashes   [] Ulcers   [] Wounds Psychological:  [] History of anxiety   []  History of major depression.  Physical Examination  BP (!) 114/47 (BP Location: Right Arm)   Pulse 69   Resp 16   Ht 5\' 7"  (1.702 m)   Wt 96.2 kg (212 lb)   BMI 33.20 kg/m  Gen:  WD/WN, NAD. Appears younger than stated age Head: [place]/AT, No temporalis wasting. Ear/Nose/Throat: Hearing grossly intact, nares w/o erythema or drainage, trachea midline Eyes: Conjunctiva clear. Sclera non-icteric Neck: Supple.  No JVD.  Pulmonary:  Good air movement, no use of accessory muscles.  Cardiac: irregularly irregular Vascular:  Vessel Right Left  Radial Palpable Palpable                                   Gastrointestinal: urostomy bag in place Musculoskeletal: M/S 5/5 throughout.  No deformity or atrophy. 1-2+ BLE edema. Walks with a cane. Neurologic: Sensation grossly intact in extremities.  Symmetrical.  Speech is fluent.  Psychiatric: Judgment intact, Mood & affect  appropriate for pt's clinical situation. Dermatologic: No rashes or ulcers noted.  No cellulitis or open wounds.       Labs Recent Results (from the past 2160 hour(s))  Comprehensive metabolic panel     Status: Abnormal   Collection Time: 09/17/16  3:14 PM  Result Value Ref Range   Sodium 142 135 - 146 mmol/L   Potassium 4.0 3.5 - 5.3 mmol/L   Chloride 107 98 - 110 mmol/L   CO2 27 20 - 32 mmol/L    Comment: ** Please note change in reference range(s). **      Glucose, Bld 86 65 - 99 mg/dL   BUN 21 7 - 25 mg/dL   Creat 1.48 (H) 0.70 - 1.11 mg/dL    Comment:   For patients > or = 81 years of age: The upper reference limit for Creatinine is approximately 13% higher for people identified as African-American.      Total Bilirubin 1.0 0.2 - 1.2 mg/dL   Alkaline Phosphatase 103 40 - 115 U/L   AST 22 10 - 35 U/L   ALT 11 9 - 46 U/L   Total Protein 6.4 6.1 - 8.1 g/dL   Albumin 3.6 3.6 - 5.1 g/dL   Calcium 8.7 8.6 - 10.3 mg/dL  Renal function panel     Status: Abnormal   Collection Time: 09/28/16 10:35 AM  Result Value Ref Range   Sodium 142 135 - 145 mEq/L   Potassium 4.5 3.5 - 5.1 mEq/L   Chloride 107 96 - 112 mEq/L   CO2 29 19 - 32 mEq/L   Calcium 9.1 8.4 - 10.5 mg/dL   Albumin 3.4 (  L) 3.5 - 5.2 g/dL   BUN 17 6 - 23 mg/dL   Creatinine, Ser 1.34 0.40 - 1.50 mg/dL   Glucose, Bld 186 (H) 70 - 99 mg/dL   Phosphorus 2.9 2.3 - 4.6 mg/dL   GFR 54.34 (L) >60.00 mL/min    Radiology No results found.    Assessment/Plan  Controlled diabetes mellitus type 2 with complications (HCC) blood glucose control important in reducing the progression of atherosclerotic disease. Also, involved in wound healing. On appropriate medications.   Cirrhosis, cryptogenic Could certainly worsen his LE swelling  HLD (hyperlipidemia) lipid control important in reducing the progression of atherosclerotic disease. Continue statin therapy   Essential hypertension blood pressure control  important in reducing the progression of atherosclerotic disease. On appropriate oral medications.   Varicose veins of both legs with edema His noninvasive studies today demonstrate normal ABIs of 1.3 on the right and 1.1 on the left with brisk normal triphasic waveforms consistent with no arterial insufficiency venous duplex today demonstrates no evidence of deep venous thrombosis or superficial thrombophlebitis.  There is no deep venous reflux.  There was reflux in both great saphenous veins and the left small saphenous vein Given his symptoms of venous insufficiency, I would recommend he continue conservative therapy with compression stockings, leg elevation, and increasing his we will try this for 2-3 more months to see how he does with this conservative therapy.  He has had slight improvement, but not significant I will plan to see him back in 2-3 months for follow-up.    Leotis Pain, MD  11/26/2016 5:00 PM    This note was created with Dragon medical transcription system.  Any errors from dictation are purely unintentional

## 2016-12-07 DIAGNOSIS — D485 Neoplasm of uncertain behavior of skin: Secondary | ICD-10-CM | POA: Diagnosis not present

## 2016-12-07 DIAGNOSIS — L821 Other seborrheic keratosis: Secondary | ICD-10-CM | POA: Diagnosis not present

## 2016-12-07 DIAGNOSIS — C44319 Basal cell carcinoma of skin of other parts of face: Secondary | ICD-10-CM | POA: Diagnosis not present

## 2016-12-07 DIAGNOSIS — D0462 Carcinoma in situ of skin of left upper limb, including shoulder: Secondary | ICD-10-CM | POA: Diagnosis not present

## 2016-12-07 DIAGNOSIS — C44612 Basal cell carcinoma of skin of right upper limb, including shoulder: Secondary | ICD-10-CM | POA: Diagnosis not present

## 2016-12-08 DIAGNOSIS — J449 Chronic obstructive pulmonary disease, unspecified: Secondary | ICD-10-CM | POA: Diagnosis not present

## 2016-12-20 DIAGNOSIS — C44612 Basal cell carcinoma of skin of right upper limb, including shoulder: Secondary | ICD-10-CM | POA: Diagnosis not present

## 2016-12-20 DIAGNOSIS — R208 Other disturbances of skin sensation: Secondary | ICD-10-CM | POA: Diagnosis not present

## 2016-12-20 DIAGNOSIS — D485 Neoplasm of uncertain behavior of skin: Secondary | ICD-10-CM | POA: Diagnosis not present

## 2016-12-20 DIAGNOSIS — L538 Other specified erythematous conditions: Secondary | ICD-10-CM | POA: Diagnosis not present

## 2016-12-20 DIAGNOSIS — L820 Inflamed seborrheic keratosis: Secondary | ICD-10-CM | POA: Diagnosis not present

## 2016-12-20 DIAGNOSIS — D045 Carcinoma in situ of skin of trunk: Secondary | ICD-10-CM | POA: Diagnosis not present

## 2016-12-24 ENCOUNTER — Encounter: Payer: Self-pay | Admitting: Family Medicine

## 2016-12-24 ENCOUNTER — Ambulatory Visit: Payer: PPO | Admitting: Family Medicine

## 2016-12-24 VITALS — BP 118/60 | HR 81 | Temp 98.0°F | Wt 210.0 lb

## 2016-12-24 DIAGNOSIS — D485 Neoplasm of uncertain behavior of skin: Secondary | ICD-10-CM | POA: Diagnosis not present

## 2016-12-24 DIAGNOSIS — K7469 Other cirrhosis of liver: Secondary | ICD-10-CM

## 2016-12-24 DIAGNOSIS — I83893 Varicose veins of bilateral lower extremities with other complications: Secondary | ICD-10-CM | POA: Diagnosis not present

## 2016-12-24 DIAGNOSIS — Z23 Encounter for immunization: Secondary | ICD-10-CM

## 2016-12-24 DIAGNOSIS — I481 Persistent atrial fibrillation: Secondary | ICD-10-CM

## 2016-12-24 DIAGNOSIS — E118 Type 2 diabetes mellitus with unspecified complications: Secondary | ICD-10-CM | POA: Diagnosis not present

## 2016-12-24 DIAGNOSIS — R600 Localized edema: Secondary | ICD-10-CM | POA: Diagnosis not present

## 2016-12-24 DIAGNOSIS — D509 Iron deficiency anemia, unspecified: Secondary | ICD-10-CM | POA: Diagnosis not present

## 2016-12-24 DIAGNOSIS — D638 Anemia in other chronic diseases classified elsewhere: Secondary | ICD-10-CM

## 2016-12-24 DIAGNOSIS — I4819 Other persistent atrial fibrillation: Secondary | ICD-10-CM

## 2016-12-24 LAB — CBC WITH DIFFERENTIAL/PLATELET
BASOS PCT: 0.8 % (ref 0.0–3.0)
Basophils Absolute: 0 10*3/uL (ref 0.0–0.1)
EOS PCT: 9.1 % — AB (ref 0.0–5.0)
Eosinophils Absolute: 0.3 10*3/uL (ref 0.0–0.7)
HCT: 28.6 % — ABNORMAL LOW (ref 39.0–52.0)
Hemoglobin: 9.4 g/dL — ABNORMAL LOW (ref 13.0–17.0)
LYMPHS ABS: 0.9 10*3/uL (ref 0.7–4.0)
Lymphocytes Relative: 25.1 % (ref 12.0–46.0)
MCHC: 32.8 g/dL (ref 30.0–36.0)
MCV: 92.2 fl (ref 78.0–100.0)
MONOS PCT: 10.2 % (ref 3.0–12.0)
Monocytes Absolute: 0.4 10*3/uL (ref 0.1–1.0)
NEUTROS ABS: 2 10*3/uL (ref 1.4–7.7)
NEUTROS PCT: 54.8 % (ref 43.0–77.0)
PLATELETS: 106 10*3/uL — AB (ref 150.0–400.0)
RBC: 3.11 Mil/uL — ABNORMAL LOW (ref 4.22–5.81)
RDW: 15.3 % (ref 11.5–15.5)
WBC: 3.7 10*3/uL — ABNORMAL LOW (ref 4.0–10.5)

## 2016-12-24 LAB — IBC PANEL
Iron: 79 ug/dL (ref 42–165)
SATURATION RATIOS: 18 % — AB (ref 20.0–50.0)
Transferrin: 314 mg/dL (ref 212.0–360.0)

## 2016-12-24 LAB — HEMOGLOBIN A1C: Hgb A1c MFr Bld: 5.6 % (ref 4.6–6.5)

## 2016-12-24 LAB — FERRITIN: Ferritin: 23.4 ng/mL (ref 22.0–322.0)

## 2016-12-24 NOTE — Progress Notes (Addendum)
BP 118/60 (BP Location: Left Arm, Patient Position: Sitting, Cuff Size: Normal)   Pulse 81   Temp 98 F (36.7 C) (Oral)   Wt 210 lb (95.3 kg)   SpO2 90%   BMI 32.89 kg/m    CC: 3 mo f/u visit Subjective:    Patient ID: [name], male    DOB: [birth year], 83 y.o.   MRN: [id number]  HPI: [name] is a 83 y.o. male presenting on 12/24/2016 for 3 mo follow-up   Known COPD, HFpEF, cryptogenic cirrhosis, diabetes.   Reviewed latest [facility] note - normal ABIs. For CVI - recommended compression stockings, leg elevation. On lasix 40/20mg  bid. Last visit we added spironolactone 25mg  daily.   Recent skin surgeries for cancers L chest, L nose. [facility]. Pending L cheek lesions to be done in [place].   DM - does not regularly check sugars overall well controlled. Compliant with antihyperglycemic regimen which includes: metformin 250mg  once daily. Denies low sugars or hypoglycemic symptoms. Denies paresthesias. Last diabetic eye exam 2017 DUE. Pneumovax: 2006. Prevnar: 2016. Glucometer brand: one-touch. DSME: has not completed. Foot exam overdue.  Lab Results  Component Value Date   HGBA1C 5.6 12/10/2015   Diabetic Foot Exam - Simple   Simple Foot Form Diabetic Foot exam was performed with the following findings:  Yes 12/24/2016  8:51 AM  Visual Inspection See comments:  Yes Sensation Testing Intact to touch and monofilament testing bilaterally:  Yes Pulse Check Posterior Tibialis and Dorsalis pulse intact bilaterally:  Yes Comments Thickened toenails throughout    Lab Results  Component Value Date   MICROALBUR 14.2 (H) 03/05/2015     Relevant past medical, surgical, family and social history reviewed and updated as indicated. Interim medical history since our last visit reviewed. Allergies and medications reviewed and updated. Outpatient Medications Prior to Visit  Medication Sig Dispense Refill  . acetaminophen (TYLENOL) 325 MG tablet Take 325 mg by mouth every 6  (six) hours as needed.    Jearl Klinefelter ELLIPTA 62.5-25 MCG/INH AEPB   0  . cloNIDine (CATAPRES) 0.2 MG tablet take 1 tablet by mouth twice a day 180 tablet 3  . doxazosin (CARDURA) 8 MG tablet take 1/2 tablet by mouth twice a day 90 tablet 1  . ferrous sulfate (FERRO-BOB) 325 (65 FE) MG tablet Take 1 tablet (325 mg total) by mouth daily with breakfast. (Patient taking differently: Take 325 mg by mouth daily. )    . furosemide (LASIX) 20 MG tablet Take 1 tablet (20 mg total) by mouth 2 (two) times daily. Take 40mg  in morning, 20mg  in afternoon 60 tablet 11  . glucose blood (ONE TOUCH ULTRA TEST) test strip Use to test sugar once daily and as needed Dx: E11.8 100 each 1  . losartan (COZAAR) 100 MG tablet take 1 tablet by mouth once daily 90 tablet 0  . Magnesium Oxide 400 (240 Mg) MG TABS Take 1 tablet (400 mg total) by mouth daily. 90 tablet 3  . metFORMIN (GLUCOPHAGE) 500 MG tablet take 1/2 tablet by mouth once daily with BREAKFAST 45 tablet 3  . nadolol (CORGARD) 40 MG tablet take 1 tablet by mouth once daily 90 tablet 3  . ONE TOUCH LANCETS MISC One daily and as needed/ ICD code 250.00     . pantoprazole (PROTONIX) 40 MG tablet take 1 tablet by mouth once daily 90 tablet 3  . potassium chloride (K-DUR,KLOR-CON) 10 MEQ tablet Take 10 mEq by mouth daily.  1  . Respiratory Therapy Supplies (NEBULIZER) DEVI by Does not apply route. Use as directed     . simvastatin (ZOCOR) 20 MG tablet take 1 tablet by mouth once daily 90 tablet 3  . spironolactone (ALDACTONE) 25 MG tablet Take 1 tablet (25 mg total) by mouth daily. 30 tablet 6  . tiotropium (SPIRIVA) 18 MCG inhalation capsule Place 18 mcg into inhaler and inhale daily.    Marland Kitchen triamcinolone cream (KENALOG) 0.1 % Apply 1 application topically 2 (two) times daily. Apply to AA. 30 g 0  . vitamin B-12 (CYANOCOBALAMIN) 250 MCG tablet Take 250 mcg by mouth daily.     No facility-administered medications prior to visit.      Per HPI unless specifically  indicated in ROS section below Review of Systems     Objective:    BP 118/60 (BP Location: Left Arm, Patient Position: Sitting, Cuff Size: Normal)   Pulse 81   Temp 98 F (36.7 C) (Oral)   Wt 210 lb (95.3 kg)   SpO2 90%   BMI 32.89 kg/m   Wt Readings from Last 3 Encounters:  12/24/16 210 lb (95.3 kg)  11/26/16 212 lb (96.2 kg)  10/15/16 221 lb (100.2 kg)    Physical Exam  Constitutional: He appears well-developed and well-nourished. No distress.  HENT:  Head: Normocephalic and atraumatic.  Right Ear: External ear normal.  Left Ear: External ear normal.  Nose: Nose normal.  Mouth/Throat: Oropharynx is clear and moist. No oropharyngeal exudate.  Eyes: Conjunctivae and EOM are normal. Pupils are equal, round, and reactive to light. No scleral icterus.  Neck: Normal range of motion. Neck supple.  Cardiovascular: Normal rate, regular rhythm, normal heart sounds and intact distal pulses.  No murmur heard. Pulmonary/Chest: Effort normal and breath sounds normal. No respiratory distress. He has no wheezes. He has no rales.  Musculoskeletal: He exhibits edema (1+ pitting - improvement).  See HPI for foot exam if done  Lymphadenopathy:    He has no cervical adenopathy.  Skin: Skin is warm and dry. No rash noted.  Psychiatric: He has a normal mood and affect.  Nursing note and vitals reviewed.  Results for orders placed or performed in visit on 09/28/16  Renal function panel  Result Value Ref Range   Sodium 142 135 - 145 mEq/L   Potassium 4.5 3.5 - 5.1 mEq/L   Chloride 107 96 - 112 mEq/L   CO2 29 19 - 32 mEq/L   Calcium 9.1 8.4 - 10.5 mg/dL   Albumin 3.4 (L) 3.5 - 5.2 g/dL   BUN 17 6 - 23 mg/dL   Creatinine, Ser 1.34 0.40 - 1.50 mg/dL   Glucose, Bld 186 (H) 70 - 99 mg/dL   Phosphorus 2.9 2.3 - 4.6 mg/dL   GFR 54.34 (L) >60.00 mL/min      Assessment & Plan:   Problem List Items Addressed This Visit    Anemia, chronic disease    Update labs.       Atrial fibrillation  ([place])    Sounds regular today.       Cirrhosis, cryptogenic (HCC)    Stable period.       Controlled diabetes mellitus type 2 with complications (HCC) - Primary    Chronic, stable. Update labs. Continue low dose metformin. Encouraged podiatry referral - pt declines for now.       Relevant Orders   Hemoglobin A1c   Fructosamine   Iron deficiency anemia    Update labs. On iron  replacement one daily.       Relevant Orders   CBC with Differential/Platelet   Ferritin   IBC panel   Leg edema    Better on current lasix/spironolactone regimen.       Neoplasm of uncertain behavior of skin    Has f/u with derm for several skin cancers found.       Varicose veins of both legs with edema       Follow up plan: Return in about 4 months (around 04/23/2017) for annual exam, prior fasting for blood work, medicare wellness visit.  Ria Bush, MD

## 2016-12-24 NOTE — Patient Instructions (Addendum)
Flu shot today Labs today  Consider podiatrist for foot care.  You are doing well today Return in 4 months for medicare wellness with Katha Cabal and physical with me.

## 2016-12-24 NOTE — Assessment & Plan Note (Addendum)
Update labs. On iron replacement one daily.

## 2016-12-24 NOTE — Assessment & Plan Note (Signed)
Has f/u with derm for several skin cancers found.

## 2016-12-24 NOTE — Assessment & Plan Note (Signed)
Stable period.  

## 2016-12-24 NOTE — Assessment & Plan Note (Addendum)
Chronic, stable. Update labs. Continue low dose metformin. Encouraged podiatry referral - pt declines for now.

## 2016-12-24 NOTE — Assessment & Plan Note (Signed)
Sounds regular today.  ?

## 2016-12-24 NOTE — Assessment & Plan Note (Signed)
Update labs.  

## 2016-12-24 NOTE — Addendum Note (Signed)
Addended by: Brenton Grills on: 28/11/8865 09:14 AM   Modules accepted: Orders

## 2016-12-24 NOTE — Assessment & Plan Note (Signed)
Better on current lasix/spironolactone regimen.

## 2016-12-27 LAB — FRUCTOSAMINE: FRUCTOSAMINE: 278 umol/L — AB (ref 190–270)

## 2016-12-31 ENCOUNTER — Other Ambulatory Visit: Payer: Self-pay | Admitting: Family Medicine

## 2017-01-07 DIAGNOSIS — J449 Chronic obstructive pulmonary disease, unspecified: Secondary | ICD-10-CM | POA: Diagnosis not present

## 2017-01-10 DIAGNOSIS — C44519 Basal cell carcinoma of skin of other part of trunk: Secondary | ICD-10-CM | POA: Diagnosis not present

## 2017-01-10 DIAGNOSIS — C44612 Basal cell carcinoma of skin of right upper limb, including shoulder: Secondary | ICD-10-CM | POA: Diagnosis not present

## 2017-01-10 DIAGNOSIS — L905 Scar conditions and fibrosis of skin: Secondary | ICD-10-CM | POA: Diagnosis not present

## 2017-01-10 DIAGNOSIS — C44622 Squamous cell carcinoma of skin of right upper limb, including shoulder: Secondary | ICD-10-CM | POA: Diagnosis not present

## 2017-01-19 DIAGNOSIS — Z936 Other artificial openings of urinary tract status: Secondary | ICD-10-CM | POA: Diagnosis not present

## 2017-01-24 DIAGNOSIS — L905 Scar conditions and fibrosis of skin: Secondary | ICD-10-CM | POA: Diagnosis not present

## 2017-01-24 DIAGNOSIS — C44612 Basal cell carcinoma of skin of right upper limb, including shoulder: Secondary | ICD-10-CM | POA: Diagnosis not present

## 2017-01-25 HISTORY — PX: BASAL CELL CARCINOMA EXCISION: SHX1214

## 2017-02-01 DIAGNOSIS — C44319 Basal cell carcinoma of skin of other parts of face: Secondary | ICD-10-CM | POA: Diagnosis not present

## 2017-02-01 DIAGNOSIS — Z85828 Personal history of other malignant neoplasm of skin: Secondary | ICD-10-CM | POA: Diagnosis not present

## 2017-02-07 DIAGNOSIS — J449 Chronic obstructive pulmonary disease, unspecified: Secondary | ICD-10-CM | POA: Diagnosis not present

## 2017-02-08 ENCOUNTER — Ambulatory Visit (INDEPENDENT_AMBULATORY_CARE_PROVIDER_SITE_OTHER): Payer: PPO | Admitting: Vascular Surgery

## 2017-02-08 DIAGNOSIS — L905 Scar conditions and fibrosis of skin: Secondary | ICD-10-CM | POA: Diagnosis not present

## 2017-02-08 DIAGNOSIS — C44612 Basal cell carcinoma of skin of right upper limb, including shoulder: Secondary | ICD-10-CM | POA: Diagnosis not present

## 2017-02-11 ENCOUNTER — Ambulatory Visit (INDEPENDENT_AMBULATORY_CARE_PROVIDER_SITE_OTHER): Payer: PPO | Admitting: Vascular Surgery

## 2017-02-15 ENCOUNTER — Other Ambulatory Visit: Payer: Self-pay | Admitting: Family Medicine

## 2017-02-15 DIAGNOSIS — R0609 Other forms of dyspnea: Secondary | ICD-10-CM | POA: Diagnosis not present

## 2017-02-15 DIAGNOSIS — J449 Chronic obstructive pulmonary disease, unspecified: Secondary | ICD-10-CM | POA: Diagnosis not present

## 2017-02-15 DIAGNOSIS — G4734 Idiopathic sleep related nonobstructive alveolar hypoventilation: Secondary | ICD-10-CM | POA: Diagnosis not present

## 2017-02-25 ENCOUNTER — Ambulatory Visit (INDEPENDENT_AMBULATORY_CARE_PROVIDER_SITE_OTHER): Payer: PPO | Admitting: Vascular Surgery

## 2017-02-25 ENCOUNTER — Encounter (INDEPENDENT_AMBULATORY_CARE_PROVIDER_SITE_OTHER): Payer: Self-pay | Admitting: Vascular Surgery

## 2017-02-25 VITALS — BP 137/58 | HR 57 | Resp 15 | Ht 68.0 in | Wt 207.4 lb

## 2017-02-25 DIAGNOSIS — E118 Type 2 diabetes mellitus with unspecified complications: Secondary | ICD-10-CM | POA: Diagnosis not present

## 2017-02-25 DIAGNOSIS — I10 Essential (primary) hypertension: Secondary | ICD-10-CM

## 2017-02-25 DIAGNOSIS — E782 Mixed hyperlipidemia: Secondary | ICD-10-CM | POA: Diagnosis not present

## 2017-02-25 DIAGNOSIS — I83893 Varicose veins of bilateral lower extremities with other complications: Secondary | ICD-10-CM

## 2017-02-25 DIAGNOSIS — I481 Persistent atrial fibrillation: Secondary | ICD-10-CM | POA: Diagnosis not present

## 2017-02-25 DIAGNOSIS — I4819 Other persistent atrial fibrillation: Secondary | ICD-10-CM

## 2017-02-25 DIAGNOSIS — K7469 Other cirrhosis of liver: Secondary | ICD-10-CM

## 2017-02-25 NOTE — Progress Notes (Signed)
MRN : [id number]  [name] is a 84 y.o. ([birth year]) male who presents with chief complaint of  Chief Complaint  Patient presents with  . Follow-up    2-62month  .  History of Present Illness: Patient returns today in follow up of leg pain and swelling.  Since his last visit, the symptoms have gotten worse.  Elevation and compression have not really helped his symptoms and at this point, his left leg is swollen enough it has become very bothersome on a daily basis.  The right leg has swelling to, although not as severe as the left.  A previously performed venous duplex demonstrated bilateral great saphenous vein reflux without DVT or superficial thrombophlebitis.   Current Outpatient Prescriptions  Medication Sig Dispense Refill  . acetaminophen (TYLENOL) 325 MG tablet Take 325 mg by mouth every 6 (six) hours as needed.    Jearl Klinefelter ELLIPTA 62.5-25 MCG/INH AEPB   0  . cloNIDine (CATAPRES) 0.2 MG tablet take 1 tablet by mouth twice a day 180 tablet 3  . doxazosin (CARDURA) 8 MG tablet take 1/2 tablet by mouth twice a day 90 tablet 1  . ferrous sulfate (FERRO-BOB) 325 (65 FE) MG tablet Take 1 tablet (325 mg total) by mouth daily with breakfast. (Patient taking differently: Take 325 mg by mouth daily. )    . furosemide (LASIX) 20 MG tablet Take 1 tablet (20 mg total) by mouth 2 (two) times daily. Take 40mg  in morning, 20mg  in afternoon 60 tablet 11  . glucose blood (ONE TOUCH ULTRA TEST) test strip Use to test sugar once daily and as needed Dx: E11.8 100 each 1  . losartan (COZAAR) 100 MG tablet take 1 tablet by mouth once daily 90 tablet 0  . Magnesium Oxide 400 (240 Mg) MG TABS Take 1 tablet (400 mg total) by mouth daily. 90 tablet 3  . metFORMIN (GLUCOPHAGE) 500 MG tablet take 1/2 tablet by mouth once daily with BREAKFAST 45 tablet 3  . nadolol (CORGARD) 40 MG tablet take 1 tablet by mouth once daily 90 tablet 3  . ONE TOUCH LANCETS MISC One daily and as needed/ ICD code 250.00      . pantoprazole (PROTONIX) 40 MG tablet take 1 tablet by mouth once daily 90 tablet 3  . potassium chloride (K-DUR,KLOR-CON) 10 MEQ tablet Take 10 mEq by mouth daily.   1  . Respiratory Therapy Supplies (NEBULIZER) DEVI by Does not apply route. Use as directed     . simvastatin (ZOCOR) 20 MG tablet take 1 tablet by mouth once daily 90 tablet 3  . spironolactone (ALDACTONE) 25 MG tablet Take 1 tablet (25 mg total) by mouth daily. 30 tablet 6  . tiotropium (SPIRIVA) 18 MCG inhalation capsule Place 18 mcg into inhaler and inhale daily.    Marland Kitchen triamcinolone cream (KENALOG) 0.1 % Apply 1 application topically 2 (two) times daily. Apply to AA. 30 g 0  . vitamin B-12 (CYANOCOBALAMIN) 250 MCG tablet Take 250 mcg by mouth daily.     No current facility-administered medications for this visit.         Past Medical History:  Diagnosis Date  . Arthritis   . Atrial fibrillation ([place]) 06/23/2010  . B12 deficiency   . C. difficile colitis 07/06/2011  . CHF (congestive heart failure) (HCC)    diastolic. Echo 5/10 with mild LVH, EF 16%, grade 1 diastolic dysfunction, severe left atrial enlargement, aortic sclerosis  . Chronic a-fib ([place])  on pradaxa, declined cardioversion in past  . COPD (chronic obstructive pulmonary disease) ([place])    (Dr. Raul Del)  . Cryptogenic cirrhosis ([place])    Followed by Dr Fuller Plan, never biopsied, has been stable.  History of gastric varices.  . Diabetes mellitus, type 2 ([place])   . Gastric and duodenal angiodysplasia 06/28/2011  . Gastric varices   . GI bleed   . Hemorrhoids   . History of bladder cancer 1984   s/p ileal conduit  . History of ileostomy   . History of pneumonia 2012  . Hyperlipidemia   . Hypertension   . Inguinal hernia    left  . Iron deficiency anemia   . Kidney atrophy 02/2016  . Pinched nerve    back  . Pyloric stenosis    mild EGD 10/26/06  . RLL pneumonia ([place]) 01/28/2014  . Shortness of breath            Past Surgical History:  Procedure Laterality Date  . BE  08/08/2003  . BLADDER REMOVAL     s/p urostomy bag  . COLONOSCOPY  07/24/2003   Int. hemorrhoids  . COLONOSCOPY  10/26/2006   Int hemorrhoids, 10 yrs  . ESOPHAGOGASTRODUODENOSCOPY  10/26/2006   Gastric varices, pyloric stenosis  . ESOPHAGOGASTRODUODENOSCOPY  07/01/2011   Procedure: ESOPHAGOGASTRODUODENOSCOPY (EGD);  Surgeon: Jerene Bears, MD;  Location: [facility];  Service: Gastroenterology;  Laterality: N/A;  . ESOPHAGOGASTRODUODENOSCOPY  06/28/2011   Procedure: ESOPHAGOGASTRODUODENOSCOPY (EGD);  Surgeon: Jerene Bears, MD;  Location: [facility];  Service: Gastroenterology;  Laterality: N/A;  . FETAL BLOOD TRANSFUSION    . ILEOSTOMY  1984   bladder cancer  . PENILE PROSTHESIS IMPLANT  1990   inflatable implant  . PROSTATECTOMY  1984   bladder cancer  . US ECHOCARDIOGRAPHY  05/1995   EF 60% TR, AI    Social History       Social History  Substance Use Topics  . Smoking status: Former Smoker    Packs/day: 1.00    Years: 45.00    Types: Cigarettes    Quit date: 06/22/1989  . Smokeless tobacco: Never Used     Comment: Quit 1990  . Alcohol use No    Family History Family History  Problem Relation Age of Onset  . Hypertension Mother   . Osteoarthritis Mother   . Heart disease Father        MI  . Coronary artery disease Sister   . Diabetes Brother   . Heart disease Brother        MI  . Coronary artery disease Sister   . Stroke Other        GPs  . Colon cancer Neg Hx     No Known Allergies   REVIEW OF SYSTEMS (Negative unless checked)  Constitutional: [] Weight loss  [] Fever  [] Chills Cardiac: [] Chest pain   [] Chest pressure   [x] Palpitations   [] Shortness of breath when laying flat   [] Shortness of breath at rest   [x] Shortness of breath with exertion. Vascular:  [] Pain in legs with walking   [x] Pain in legs at rest   [] Pain in legs when laying flat    [] Claudication   [] Pain in feet when walking  [] Pain in feet at rest  [] Pain in feet when laying flat   [] History of DVT   [] Phlebitis   [x] Swelling in legs   [] Varicose veins   [] Non-healing ulcers Pulmonary:   [] Uses home oxygen   [] Productive cough   [] Hemoptysis   []   Wheeze  [] COPD   [] Asthma Neurologic:  [] Dizziness  [] Blackouts   [] Seizures   [] History of stroke   [] History of TIA  [] Aphasia   [] Temporary blindness   [] Dysphagia   [] Weakness or numbness in arms   [] Weakness or numbness in legs Musculoskeletal:  [] Arthritis   [] Joint swelling   [] Joint pain   [] Low back pain Hematologic:  [] Easy bruising  [] Easy bleeding   [] Hypercoagulable state   [] Anemic   Gastrointestinal:  [] Blood in stool   [] Vomiting blood  [] Gastroesophageal reflux/heartburn   [] Abdominal pain Genitourinary:  [x] Chronic kidney disease   [] Difficult urination  [] Frequent urination  [] Burning with urination   [] Hematuria Skin:  [] Rashes   [] Ulcers   [] Wounds Psychological:  [] History of anxiety   []  History of major depression.    Physical Examination  BP (!) 137/58 (BP Location: Right Arm)   Pulse (!) 57   Resp 15   Ht 5\' 8"  (1.727 m)   Wt 207 lb 6.4 oz (94.1 kg)   BMI 31.54 kg/m  Gen:  WD/WN, NAD Head: [place]/AT, No temporalis wasting. Ear/Nose/Throat: Hearing grossly intact, nares w/o erythema or drainage, trachea midline Eyes: Conjunctiva clear. Sclera non-icteric Neck: Supple.  No JVD.  Pulmonary:  Good air movement, no use of accessory muscles.  Cardiac: Irregular Vascular:  Vessel Right Left  Radial Palpable Palpable                                    Musculoskeletal: M/S 5/5 throughout.  No deformity or atrophy.  1+ right lower extremity edema, 2-3+ left lower extremity edema. Neurologic: Sensation grossly intact in extremities.  Symmetrical.  Speech is fluent.  Psychiatric: Judgment intact, Mood & affect appropriate for pt's clinical situation.       Labs Recent Results (from the  past 2160 hour(s))  Hemoglobin A1c     Status: None   Collection Time: 12/24/16  9:34 AM  Result Value Ref Range   Hgb A1c MFr Bld 5.6 4.6 - 6.5 %    Comment: Glycemic Control Guidelines for People with Diabetes:Non Diabetic:  <6%Goal of Therapy: <7%Additional Action Suggested:  >8%   CBC with Differential/Platelet     Status: Abnormal   Collection Time: 12/24/16  9:34 AM  Result Value Ref Range   WBC 3.7 (L) 4.0 - 10.5 K/uL   RBC 3.11 (L) 4.22 - 5.81 Mil/uL   Hemoglobin 9.4 (L) 13.0 - 17.0 g/dL   HCT 28.6 (L) 39.0 - 52.0 %   MCV 92.2 78.0 - 100.0 fl   MCHC 32.8 30.0 - 36.0 g/dL   RDW 15.3 11.5 - 15.5 %   Platelets 106.0 (L) 150.0 - 400.0 K/uL   Neutrophils Relative % 54.8 43.0 - 77.0 %   Lymphocytes Relative 25.1 12.0 - 46.0 %   Monocytes Relative 10.2 3.0 - 12.0 %   Eosinophils Relative 9.1 (H) 0.0 - 5.0 %   Basophils Relative 0.8 0.0 - 3.0 %   Neutro Abs 2.0 1.4 - 7.7 K/uL   Lymphs Abs 0.9 0.7 - 4.0 K/uL   Monocytes Absolute 0.4 0.1 - 1.0 K/uL   Eosinophils Absolute 0.3 0.0 - 0.7 K/uL   Basophils Absolute 0.0 0.0 - 0.1 K/uL  Ferritin     Status: None   Collection Time: 12/24/16  9:34 AM  Result Value Ref Range   Ferritin 23.4 22.0 - 322.0 ng/mL  IBC panel  Status: Abnormal   Collection Time: 12/24/16  9:34 AM  Result Value Ref Range   Iron 79 42 - 165 ug/dL   Transferrin 314.0 212.0 - 360.0 mg/dL   Saturation Ratios 18.0 (L) 20.0 - 50.0 %  Fructosamine     Status: Abnormal   Collection Time: 12/24/16  9:34 AM  Result Value Ref Range   Fructosamine 278 (H) 190 - 270 umol/L    Radiology No results found.    Assessment/Plan Controlled diabetes mellitus type 2 with complications (HCC) blood glucose control important in reducing the progression of atherosclerotic disease. Also, involved in wound healing. On appropriate medications.   Cirrhosis, cryptogenic Could certainly worsen his LE swelling  HLD (hyperlipidemia) lipid control important in reducing the  progression of atherosclerotic disease. Continue statin therapy   Essential hypertension blood pressure control important in reducing the progression of atherosclerotic disease. On appropriate oral medications.  Atrial fibrillation Cardiac function can certainly contribute to lower extremity swelling.  Varicose veins of both legs with edema Recommend  I have reviewed my previous  discussion with the patient regarding  varicose veins and why they cause symptoms. Patient will continue  wearing graduated compression stockings class 1 on a daily basis, beginning first thing in the morning and removing them in the evening.    In addition, behavioral modification including elevation during the day was again discussed and this will continue.  The patient has utilized over the counter pain medications and has been exercising.  However, at this time conservative therapy has not alleviated the patient's symptoms of leg pain and swelling  Recommend: laser ablation of the right and  left great saphenous veins to eliminate the symptoms of pain and swelling of the lower extremities caused by the severe superficial venous reflux disease.     Leotis Pain, MD  02/25/2017 3:19 PM    This note was created with Dragon medical transcription system.  Any errors from dictation are purely unintentional

## 2017-02-25 NOTE — Assessment & Plan Note (Signed)
Recommend  I have reviewed my previous  discussion with the patient regarding  varicose veins and why they cause symptoms. Patient will continue  wearing graduated compression stockings class 1 on a daily basis, beginning first thing in the morning and removing them in the evening.    In addition, behavioral modification including elevation during the day was again discussed and this will continue.  The patient has utilized over the counter pain medications and has been exercising.  However, at this time conservative therapy has not alleviated the patient's symptoms of leg pain and swelling  Recommend: laser ablation of the right and  left great saphenous veins to eliminate the symptoms of pain and swelling of the lower extremities caused by the severe superficial venous reflux disease.  

## 2017-02-25 NOTE — Assessment & Plan Note (Signed)
Cardiac function can certainly contribute to lower extremity swelling.

## 2017-02-25 NOTE — Patient Instructions (Signed)
Nonsurgical Procedures for Varicose Veins Various nonsurgical procedures can be used to treat varicose veins. Varicose veins are swollen, twisted veins that are visible under the skin. They occur most often in the legs. These veins may appear blue and bulging. Varicose veins are caused by damage to the valves in veins. All veins have a valve that makes blood flow in only one direction. If a valve gets weak or damaged, blood can pool and cause varicose veins. You may need a procedure to treat your varicose veins if they are causing symptoms or complications, or if lifestyle changes have not helped. These procedures can reduce pain, aching, and the risk of bleeding and blood clots. They can also improve the way the affected area looks (cosmetic appearance). The three common nonsurgical procedures are:  Sclerotherapy. A chemical is injected to close off a vein.  Laser treatment. Light energy is applied to close off the vein.  Radiofrequency vein ablation. Electrical energy is used to produce heat that closes off the vein.  Your health care provider will discuss the method that is best for you based on your condition. Tell a health care provider about:  Any allergies you have.  All medicines you are taking, including vitamins, herbs, eye drops, creams, and over-the-counter medicines.  Any problems you or family members have had with anesthetic medicines.  Any blood disorders you have.  Any surgeries you have had.  Any medical conditions you have.  Whether you are pregnant or may be pregnant. What are the risks? Generally, this is a safe procedure. However, problems may occur, including:  Damage to nearby nerves, tissues, or veins.  Skin irritation, sores, or dark spots.  Numbness.  Clotting.  Infection.  Allergic reactions to medicines.  Scarring.  Leg swelling.  Need for additional treatments.  Bruising.  What happens before the procedure?  Ask your health care  provider about: ? Changing or stopping your regular medicines. This is especially important if you are taking diabetes medicines or blood thinners. ? Taking over-the-counter medicines, vitamins, herbs, and supplements. ? Taking medicines such as aspirin and ibuprofen. These medicines can thin your blood. Do not take these medicines unless your health care provider tells you to take them.  You may have an exam or testing. This can include a tests to: ? Check for clots and check blood flow using sound waves (Doppler ultrasound). ? Observe how blood flows through your veins by injecting a dye that outlines your veins on X-rays (angiogram). This test is used in rare cases. What happens during the procedure? One of the following procedures will be performed: Sclerotherapy This procedure is often used for small to medium veins.  A chemical (sclerosant) that irritates the lining of the vein will be injected into the vein. This will cause the varicose vein to be closed off. Sclerosants in different amounts and strengths can be used, depending on the size and location of the vein.  All of the varicose vein sites will be injected. You may need more than one treatment because new varicose veins may develop, or more than one injection may be needed for each varicose vein.  Laser treatment There are two ways that lasers are used to treat varicose veins:  Light energy from a laser may be directed onto the vein through the skin.  A needle may be used to pass a thin laser catheter into the vein to cause it to close.  You may need more than one treatment if the vein re-opens.   In some cases, laser treatment may be combined with sclerotherapy. Radiofrequency vein ablation  You will be given a medicine that numbs the area (local anesthetic).  A small incision will be made near the varicose vein.  A thin tube (catheter) will be threaded into your vein.  The tip of the catheter will deploy  electrodes.  The electrodes will deliver electrical energy to produce heat that closes off the vein. What happens after the procedure?  A bandage (dressing) may be used to cover the injection site or incisions.  You may have to wear compression stockings. These stockings help to prevent blood clots and reduce swelling in your legs.  Return to your normal activities as told by your health care provider. Summary  Varicose veins are swollen, twisted veins that are visible under the skin. They occur most often in the legs.  Various procedures can be used to treat varicose veins. You may need a procedure to treat your varicose veins if they are causing symptoms or complications, or if lifestyle changes have not helped.  Your health care provider will discuss the method that is best for you based on your condition. This information is not intended to replace advice given to you by your health care provider. Make sure you discuss any questions you have with your health care provider. Document Released: 04/23/2016 Document Revised: 04/23/2016 Document Reviewed: 04/23/2016 Elsevier Interactive Patient Education  2018 Else[employer] Inc.  

## 2017-03-03 ENCOUNTER — Other Ambulatory Visit: Payer: Self-pay | Admitting: Family Medicine

## 2017-03-10 DIAGNOSIS — J449 Chronic obstructive pulmonary disease, unspecified: Secondary | ICD-10-CM | POA: Diagnosis not present

## 2017-03-14 ENCOUNTER — Other Ambulatory Visit: Payer: Self-pay | Admitting: Family Medicine

## 2017-03-14 NOTE — Telephone Encounter (Signed)
Copied from [place] [id number]. Topic: General - Other >> Mar 14, 2017 11:04 AM Darl Householder, RMA wrote: Reason for CRM: Medication refill request for  glucose blood (ONE TOUCH ULTRA TEST) test strip to be sent to [facility] [address]

## 2017-03-15 ENCOUNTER — Other Ambulatory Visit: Payer: Self-pay | Admitting: *Deleted

## 2017-03-15 MED ORDER — GLUCOSE BLOOD VI STRP: ORAL_STRIP | 1 refills | Status: DC

## 2017-03-25 ENCOUNTER — Other Ambulatory Visit: Payer: Self-pay | Admitting: Family Medicine

## 2017-03-29 ENCOUNTER — Other Ambulatory Visit: Payer: Self-pay | Admitting: Family Medicine

## 2017-04-07 DIAGNOSIS — J449 Chronic obstructive pulmonary disease, unspecified: Secondary | ICD-10-CM | POA: Diagnosis not present

## 2017-04-18 ENCOUNTER — Other Ambulatory Visit: Payer: Self-pay | Admitting: Family Medicine

## 2017-04-18 DIAGNOSIS — E118 Type 2 diabetes mellitus with unspecified complications: Secondary | ICD-10-CM

## 2017-04-18 DIAGNOSIS — E782 Mixed hyperlipidemia: Secondary | ICD-10-CM

## 2017-04-18 DIAGNOSIS — D509 Iron deficiency anemia, unspecified: Secondary | ICD-10-CM

## 2017-04-19 ENCOUNTER — Ambulatory Visit (INDEPENDENT_AMBULATORY_CARE_PROVIDER_SITE_OTHER): Payer: PPO

## 2017-04-19 VITALS — BP 100/50 | HR 53 | Temp 97.5°F | Ht 67.5 in | Wt 205.2 lb

## 2017-04-19 DIAGNOSIS — Z0000 Encounter for general adult medical examination without abnormal findings: Secondary | ICD-10-CM | POA: Diagnosis not present

## 2017-04-19 DIAGNOSIS — D509 Iron deficiency anemia, unspecified: Secondary | ICD-10-CM

## 2017-04-19 DIAGNOSIS — E782 Mixed hyperlipidemia: Secondary | ICD-10-CM

## 2017-04-19 DIAGNOSIS — E118 Type 2 diabetes mellitus with unspecified complications: Secondary | ICD-10-CM

## 2017-04-19 LAB — CBC WITH DIFFERENTIAL/PLATELET
BASOS PCT: 0.8 % (ref 0.0–3.0)
Basophils Absolute: 0 10*3/uL (ref 0.0–0.1)
EOS ABS: 0.2 10*3/uL (ref 0.0–0.7)
Eosinophils Relative: 6.1 % — ABNORMAL HIGH (ref 0.0–5.0)
LYMPHS PCT: 27.1 % (ref 12.0–46.0)
Lymphs Abs: 0.9 10*3/uL (ref 0.7–4.0)
MCHC: 33.9 g/dL (ref 30.0–36.0)
MCV: 95.5 fl (ref 78.0–100.0)
MONO ABS: 0.3 10*3/uL (ref 0.1–1.0)
Monocytes Relative: 9.3 % (ref 3.0–12.0)
Neutro Abs: 1.9 10*3/uL (ref 1.4–7.7)
Neutrophils Relative %: 56.7 % (ref 43.0–77.0)
Platelets: 133 10*3/uL — ABNORMAL LOW (ref 150.0–400.0)
RBC: 2.76 Mil/uL — AB (ref 4.22–5.81)
RDW: 14.7 % (ref 11.5–15.5)
WBC: 3.3 10*3/uL — AB (ref 4.0–10.5)

## 2017-04-19 LAB — COMPREHENSIVE METABOLIC PANEL
ALBUMIN: 3 g/dL — AB (ref 3.5–5.2)
ALK PHOS: 123 U/L — AB (ref 39–117)
ALT: 15 U/L (ref 0–53)
AST: 36 U/L (ref 0–37)
BILIRUBIN TOTAL: 0.8 mg/dL (ref 0.2–1.2)
BUN: 28 mg/dL — AB (ref 6–23)
CO2: 28 mEq/L (ref 19–32)
CREATININE: 1.8 mg/dL — AB (ref 0.40–1.50)
Calcium: 8.9 mg/dL (ref 8.4–10.5)
Chloride: 107 mEq/L (ref 96–112)
GFR: 38.6 mL/min — ABNORMAL LOW (ref >60.00)
GLUCOSE: 118 mg/dL — AB (ref 70–99)
Potassium: 4.8 mEq/L (ref 3.5–5.1)
SODIUM: 141 meq/L (ref 135–145)
TOTAL PROTEIN: 6.5 g/dL (ref 6.0–8.3)

## 2017-04-19 LAB — LIPID PANEL
CHOLESTEROL: 89 mg/dL (ref 0–200)
HDL: 25.6 mg/dL — ABNORMAL LOW (ref >39.00)
LDL Cholesterol: 50 mg/dL (ref 0–99)
NonHDL: 63.55
Total CHOL/HDL Ratio: 3
Triglycerides: 70 mg/dL (ref 0.0–149.0)
VLDL: 14 mg/dL (ref 0.0–40.0)

## 2017-04-19 LAB — HEMOGLOBIN A1C: HEMOGLOBIN A1C: 4.9 % (ref 4.6–6.5)

## 2017-04-19 NOTE — Patient Instructions (Addendum)
[name] , Thank you for taking time to come for your Medicare Wellness Visit. I appreciate your ongoing commitment to your health goals. Please review the following plan we discussed and let me know if I can assist you in the future.   These are the goals we discussed: Goals    . Follow up with Primary Care Provider     Starting 04/19/2017, I will continue to take medications as prescribed and to keep appointments with PCP as scheduled.        This is a list of the screening recommended for you and due dates:  Health Maintenance  Topic Date Due  . Eye exam for diabetics  01/24/2018*  . DTaP/Tdap/Td vaccine (1 - Tdap) 05/31/2022*  . Tetanus Vaccine  05/31/2022*  . Hemoglobin A1C  10/20/2017  . Complete foot exam   12/24/2017  . Flu Shot  Completed  . Pneumonia vaccines  Completed  *Topic was postponed. The date shown is not the original due date.   Preventive Care for Adults  A healthy lifestyle and preventive care can promote health and wellness. Preventive health guidelines for adults include the following key practices.  . A routine yearly physical is a good way to check with your health care provider about your health and preventive screening. It is a chance to share any concerns and updates on your health and to receive a thorough exam.  . Visit your dentist for a routine exam and preventive care every 6 months. Brush your teeth twice a day and floss once a day. Good oral hygiene prevents tooth decay and gum disease.  . The frequency of eye exams is based on your age, health, family medical history, use  of contact lenses, and other factors. Follow your health care provider's recommendations for frequency of eye exams.  . Eat a healthy diet. Foods like vegetables, fruits, whole grains, low-fat dairy products, and lean protein foods contain the nutrients you need without too many calories. Decrease your intake of foods high in solid fats, added sugars, and salt. Eat the right  amount of calories for you. Get information about a proper diet from your health care provider, if necessary.  . Regular physical exercise is one of the most important things you can do for your health. Most adults should get at least 150 minutes of moderate-intensity exercise (any activity that increases your heart rate and causes you to sweat) each week. In addition, most adults need muscle-strengthening exercises on 2 or more days a week.  Silver Sneakers may be a benefit available to you. To determine eligibility, you may visit the website: www.silversneakers.com or contact program at (325)178-2104 Mon-Fri between 8AM-8PM.   . Maintain a healthy weight. The body mass index (BMI) is a screening tool to identify possible weight problems. It provides an estimate of body fat based on height and weight. Your health care provider can find your BMI and can help you achieve or maintain a healthy weight.   For adults 20 years and older: ? A BMI below 18.5 is considered underweight. ? A BMI of 18.5 to 24.9 is normal. ? A BMI of 25 to 29.9 is considered overweight. ? A BMI of 30 and above is considered obese.   . Maintain normal blood lipids and cholesterol levels by exercising and minimizing your intake of saturated fat. Eat a balanced diet with plenty of fruit and vegetables. Blood tests for lipids and cholesterol should begin at age 47 and be repeated every 5  years. If your lipid or cholesterol levels are high, you are over 50, or you are at high risk for heart disease, you may need your cholesterol levels checked more frequently. Ongoing high lipid and cholesterol levels should be treated with medicines if diet and exercise are not working.  . If you smoke, find out from your health care provider how to quit. If you do not use tobacco, please do not start.  . If you choose to drink alcohol, please do not consume more than 2 drinks per day. One drink is considered to be 12 ounces (355 mL) of beer, 5  ounces (148 mL) of wine, or 1.5 ounces (44 mL) of liquor.  . If you are 60-59 years old, ask your health care provider if you should take aspirin to prevent strokes.  . Use sunscreen. Apply sunscreen liberally and repeatedly throughout the day. You should seek shade when your shadow is shorter than you. Protect yourself by wearing long sleeves, pants, a wide-brimmed hat, and sunglasses year round, whenever you are outdoors.  . Once a month, do a whole body skin exam, using a mirror to look at the skin on your back. Tell your health care provider of new moles, moles that have irregular borders, moles that are larger than a pencil eraser, or moles that have changed in shape or color.

## 2017-04-20 ENCOUNTER — Other Ambulatory Visit: Payer: Self-pay | Admitting: Family Medicine

## 2017-04-20 NOTE — Progress Notes (Signed)
Subjective:   [name] is a 84 y.o. male who presents for Medicare Annual/Subsequent preventive examination.  Review of Systems:  N/A Cardiac Risk Factors include: advanced age (>63men, >63 women);male gender;obesity (BMI >30kg/m2);diabetes mellitus;dyslipidemia;hypertension     Objective:    Vitals: BP (!) 100/50 (BP Location: Left Arm, Patient Position: Sitting, Cuff Size: Normal)   Pulse (!) 53   Temp (!) 97.5 F (36.4 C) (Oral)   Ht 5' 7.5" (1.715 m) Comment: shoes  Wt 205 lb 4 oz (93.1 kg)   SpO2 94%   BMI 31.67 kg/m   Body mass index is 31.67 kg/m.  Advanced Directives 04/19/2017 10/15/2016 03/19/2016 06/27/2011  Does Patient Have a Medical Advance Directive? No Yes No Patient does not have advance directive  Type of Advance Directive - [place];Living will - -  Would patient like information on creating a medical advance directive? No - Patient declined - - -  Pre-existing out of facility DNR order (yellow form or pink MOST form) - - - No    Tobacco Social History   Tobacco Use  Smoking Status Former Smoker  . Packs/day: 1.00  . Years: 45.00  . Pack years: 45.00  . Types: Cigarettes  . Last attempt to quit: 06/22/1989  . Years since quitting: 27.8  Smokeless Tobacco Never Used  Tobacco Comment   Quit 1990     Counseling given: No Comment: Quit 1990   Clinical Intake:  Pre-visit preparation completed: Yes  Pain : No/denies pain Pain Score: 3      Nutritional Status: BMI > 30  Obese Nutritional Risks: None Diabetes: Yes CBG done?: No Did pt. bring in CBG monitor from home?: No  How often do you need to have someone help you when you read instructions, pamphlets, or other written materials from your doctor or pharmacy?: 1 - Never What is the last grade level you completed in school?: 10th grade  Interpreter Needed?: No  Comments: pt lives with spouse Information entered by :: LPinson, LPN  Past Medical History:  Diagnosis  Date  . Arthritis   . Atrial fibrillation ([place]) 06/23/2010  . B12 deficiency   . C. difficile colitis 07/06/2011  . CHF (congestive heart failure) (HCC)    diastolic. Echo 5/10 with mild LVH, EF 47%, grade 1 diastolic dysfunction, severe left atrial enlargement, aortic sclerosis  . Chronic a-fib ([facility])    on pradaxa, declined cardioversion in past  . COPD (chronic obstructive pulmonary disease) ([place])    (Dr. Raul Del)  . Cryptogenic cirrhosis ([place])    Followed by Dr Fuller Plan, never biopsied, has been stable.  History of gastric varices.  . Diabetes mellitus, type 2 ([place])   . Gastric and duodenal angiodysplasia 06/28/2011  . Gastric varices   . GI bleed   . Hemorrhoids   . History of bladder cancer 1984   s/p ileal conduit  . History of ileostomy   . History of pneumonia 2012  . Hyperlipidemia   . Hypertension   . Inguinal hernia    left  . Iron deficiency anemia   . Kidney atrophy 02/2016  . Pinched nerve    back  . Pyloric stenosis    mild EGD 10/26/06  . RLL pneumonia ([place]) 01/28/2014  . Shortness of breath    Past Surgical History:  Procedure Laterality Date  . BASAL CELL CARCINOMA EXCISION  2019   multiple sites  . BE  08/08/2003  . BLADDER REMOVAL     s/p  urostomy bag  . COLONOSCOPY  07/24/2003   Int. hemorrhoids  . COLONOSCOPY  10/26/2006   Int hemorrhoids, 10 yrs  . ESOPHAGOGASTRODUODENOSCOPY  10/26/2006   Gastric varices, pyloric stenosis  . ESOPHAGOGASTRODUODENOSCOPY  07/01/2011   Procedure: ESOPHAGOGASTRODUODENOSCOPY (EGD);  Surgeon: Jerene Bears, MD;  Location: [facility];  Service: Gastroenterology;  Laterality: N/A;  . ESOPHAGOGASTRODUODENOSCOPY  06/28/2011   Procedure: ESOPHAGOGASTRODUODENOSCOPY (EGD);  Surgeon: Jerene Bears, MD;  Location: [facility];  Service: Gastroenterology;  Laterality: N/A;  . FETAL BLOOD TRANSFUSION    . ILEOSTOMY  1984   bladder cancer  . PENILE PROSTHESIS IMPLANT  1990   inflatable implant  . PROSTATECTOMY  1984   bladder cancer    . US ECHOCARDIOGRAPHY  05/1995   EF 60% TR, AI   Family History  Problem Relation Age of Onset  . Hypertension Mother   . Osteoarthritis Mother   . Heart disease Father        MI  . Coronary artery disease Sister   . Diabetes Brother   . Heart disease Brother        MI  . Coronary artery disease Sister   . Stroke Other        GPs  . Colon cancer Neg Hx    Social History   Socioeconomic History  . Marital status: Married    Spouse name: Not on file  . Number of children: 7  . Years of education: Not on file  . Highest education level: Not on file  Occupational History  . Occupation: trucking, retired  Scientific laboratory technician  . Financial resource strain: Not on file  . Food insecurity:    Worry: Not on file    Inability: Not on file  . Transportation needs:    Medical: Not on file    Non-medical: Not on file  Tobacco Use  . Smoking status: Former Smoker    Packs/day: 1.00    Years: 45.00    Pack years: 45.00    Types: Cigarettes    Last attempt to quit: 06/22/1989    Years since quitting: 27.8  . Smokeless tobacco: Never Used  . Tobacco comment: Quit 1990  Substance and Sexual Activity  . Alcohol use: No    Alcohol/week: 0.6 oz    Types: 1 Standard drinks or equivalent per week  . Drug use: No  . Sexual activity: Not Currently  Lifestyle  . Physical activity:    Days per week: Not on file    Minutes per session: Not on file  . Stress: Not on file  Relationships  . Social connections:    Talks on phone: Not on file    Gets together: Not on file    Attends religious service: Not on file    Active member of club or organization: Not on file    Attends meetings of clubs or organizations: Not on file    Relationship status: Not on file  Other Topics Concern  . Not on file  Social History Narrative   Daily caffeine use - 1-2 cups/day coffee   Lives with wife.     7 children   Occ: retired, was Administrator.   Activity: no regular exercise   Diet: good water,  fruits/vegetables daily    Outpatient Encounter Medications as of 04/19/2017  Medication Sig  . acetaminophen (TYLENOL) 325 MG tablet Take 325 mg by mouth every 6 (six) hours as needed.  Jearl Klinefelter ELLIPTA 62.5-25 MCG/INH AEPB   .  cloNIDine (CATAPRES) 0.2 MG tablet TAKE 1 TABLET BY MOUTH TWICE A DAY  . doxazosin (CARDURA) 8 MG tablet TAKE 1/2 TABLET BY MOUTH TWICE A DAY  . ferrous sulfate (FERRO-BOB) 325 (65 FE) MG tablet Take 1 tablet (325 mg total) by mouth daily with breakfast. (Patient taking differently: Take 325 mg by mouth daily. )  . furosemide (LASIX) 20 MG tablet TAKE 2 TABLETS BY MOUTH EVERY MORNING AND 1 TABLET IN THE AFTERNOON AS DIRECTED  . glucose blood (ONE TOUCH ULTRA TEST) test strip Use to test sugar once daily and as needed Dx: E11.8  . losartan (COZAAR) 100 MG tablet take 1 tablet by mouth once daily  . Magnesium Oxide 400 (240 Mg) MG TABS Take 1 tablet (400 mg total) by mouth daily.  . metFORMIN (GLUCOPHAGE) 500 MG tablet TAKE 1/2 TABLET BY MOUTH ONCE DAILY WITH BREAKFAST  . nadolol (CORGARD) 40 MG tablet TAKE 1 TABLET BY MOUTH ONCE DAILY  . ONE TOUCH LANCETS MISC One daily and as needed/ ICD code 250.00   . pantoprazole (PROTONIX) 40 MG tablet take 1 tablet by mouth once daily  . potassium chloride (K-DUR,KLOR-CON) 10 MEQ tablet Take 10 mEq by mouth daily.   Marland Kitchen Respiratory Therapy Supplies (NEBULIZER) DEVI by Does not apply route. Use as directed   . simvastatin (ZOCOR) 20 MG tablet TAKE 1 TABLET BY MOUTH EVERY DAY  . spironolactone (ALDACTONE) 25 MG tablet Take 1 tablet (25 mg total) by mouth daily.  Marland Kitchen tiotropium (SPIRIVA) 18 MCG inhalation capsule Place 18 mcg into inhaler and inhale daily.  Marland Kitchen triamcinolone cream (KENALOG) 0.1 % Apply 1 application topically 2 (two) times daily. Apply to AA.  . vitamin B-12 (CYANOCOBALAMIN) 250 MCG tablet Take 250 mcg by mouth daily.   No facility-administered encounter medications on file as of 04/19/2017.     Activities of Daily  Living In your present state of health, do you have any difficulty performing the following activities: 04/19/2017  Hearing? N  Vision? N  Difficulty concentrating or making decisions? N  Walking or climbing stairs? Y  Dressing or bathing? N  Doing errands, shopping? N  Preparing Food and eating ? N  Using the Toilet? N  In the past six months, have you accidently leaked urine? N  Do you have problems with loss of bowel control? N  Managing your Medications? N  Managing your Finances? N  Housekeeping or managing your Housekeeping? N  Some recent data might be hidden    Patient Care Team: Ria Bush, MD as PCP - General (Family Medicine) Rockey Situ, Kathlene November, MD as Consulting Physician (Cardiology) Eula Flax, OD as Consulting Physician (Optometry)   Assessment:   This is a routine wellness examination for [name].   Hearing Screening   125Hz  250Hz  500Hz  1000Hz  2000Hz  3000Hz  4000Hz  6000Hz  8000Hz   Right ear:   40 0 0  0    Left ear:   0 40 40  0      Visual Acuity Screening   Right eye Left eye Both eyes  Without correction:     With correction: 20/30-1 20/30 20/30   Exercise Activities and Dietary recommendations Current Exercise Habits: The patient does not participate in regular exercise at present, Exercise limited by: None identified  Goals    . Follow up with Primary Care Provider     Starting 04/19/2017, I will continue to take medications as prescribed and to keep appointments with PCP as scheduled.  Fall Risk Fall Risk  04/19/2017 03/19/2016 03/12/2015 02/12/2014 02/09/2013  Falls in the past year? No No No No No   Depression Screen PHQ 2/9 Scores 04/19/2017 03/19/2016 03/12/2015 02/09/2013  PHQ - 2 Score 0 0 2 0  PHQ- 9 Score 0 - 6 -    Cognitive Function MMSE - Mini Mental State Exam 04/19/2017 03/19/2016  Orientation to time 5 5  Orientation to Place 5 5  Registration 3 3  Attention/ Calculation 0 0  Recall 1 3  Recall-comments unable to recall 2 of  3 words -  Language- name 2 objects 0 0  Language- repeat 1 1  Language- follow 3 step command 3 3  Language- read & follow direction 0 0  Write a sentence 0 0  Copy design 0 0  Total score 18 20       PLEASE NOTE: A Mini-Cog screen was completed. Maximum score is 20. A value of 0 denotes this part of [place] MMSE was not completed or the patient failed this part of the Mini-Cog screening.   Mini-Cog Screening Orientation to Time - Max 5 pts Orientation to Place - Max 5 pts Registration - Max 3 pts Recall - Max 3 pts Language Repeat - Max 1 pts Language Follow 3 Step Command - Max 3 pts   Immunization History  Administered Date(s) Administered  . Hep A / Hep B 03/19/2016, 04/16/2016, 09/17/2016  . Influenza Split 02/05/2011, 12/07/2011  . Influenza Whole 11/11/2005, 12/24/2009, 12/26/2012  . Influenza,inj,Quad PF,6+ Mos 02/12/2014, 12/16/2015, 12/24/2016  . Influenza-Unspecified 12/25/2014  . Pneumococcal Conjugate-13 09/09/2014  . Pneumococcal Polysaccharide-23 05/25/2004  . Td 06/01/2002    Screening Tests Health Maintenance  Topic Date Due  . OPHTHALMOLOGY EXAM  01/24/2018 (Originally 11/03/2016)  . DTaP/Tdap/Td (1 - Tdap) 05/31/2022 (Originally 06/02/2002)  . TETANUS/TDAP  05/31/2022 (Originally 05/31/2012)  . HEMOGLOBIN A1C  10/20/2017  . FOOT EXAM  12/24/2017  . INFLUENZA VACCINE  Completed  . PNA vac Low Risk Adult  Completed    Plan:     I have personally reviewed, addressed, and noted the following in the patient's chart:  A. Medical and social history B. Use of alcohol, tobacco or illicit drugs  C. Current medications and supplements D. Functional ability and status E.  Nutritional status F.  Physical activity G. Advance directives H. List of other physicians I.  Hospitalizations, surgeries, and ER visits in previous 12 months J.  [place] to include hearing, vision, cognitive, depression L. Referrals and appointments - none  In addition,  I have reviewed and discussed with patient certain preventive protocols, quality metrics, and best practice recommendations. A written personalized care plan for preventive services as well as general preventive health recommendations were provided to patient.  See attached scanned questionnaire for additional information.   Signed,   Lindell Noe, MHA, BS, LPN Health Coach

## 2017-04-20 NOTE — Progress Notes (Signed)
PCP notes:   Health maintenance:  Eye exam - addressed A1C - completed  Abnormal screenings:   Hearing - failed  Hearing Screening   125Hz  250Hz  500Hz  1000Hz  2000Hz  3000Hz  4000Hz  6000Hz  8000Hz   Right ear:   40 0 0  0    Left ear:   0 40 40  0     Mini-Cog score: 18/20 MMSE - Mini Mental State Exam 04/19/2017 03/19/2016  Orientation to time 5 5  Orientation to Place 5 5  Registration 3 3  Attention/ Calculation 0 0  Recall 1 3  Recall-comments unable to recall 2 of 3 words -  Language- name 2 objects 0 0  Language- repeat 1 1  Language- follow 3 step command 3 3  Language- read & follow direction 0 0  Write a sentence 0 0  Copy design 0 0  Total score 18 20    Patient concerns:   Left arm intermittent soreness. Onset approx. 2 days ago.   Nurse concerns:  None  Next PCP appt:   04/25/17 @ 1130

## 2017-04-21 LAB — FRUCTOSAMINE: FRUCTOSAMINE: 262 umol/L (ref 190–270)

## 2017-04-22 ENCOUNTER — Other Ambulatory Visit (INDEPENDENT_AMBULATORY_CARE_PROVIDER_SITE_OTHER): Payer: PPO | Admitting: Vascular Surgery

## 2017-04-25 ENCOUNTER — Encounter: Payer: Self-pay | Admitting: Family Medicine

## 2017-04-25 ENCOUNTER — Telehealth: Payer: Self-pay

## 2017-04-25 ENCOUNTER — Ambulatory Visit (INDEPENDENT_AMBULATORY_CARE_PROVIDER_SITE_OTHER): Payer: PPO | Admitting: Family Medicine

## 2017-04-25 VITALS — BP 124/64 | HR 58 | Temp 97.9°F | Ht 68.0 in | Wt 206.0 lb

## 2017-04-25 DIAGNOSIS — D61818 Other pancytopenia: Secondary | ICD-10-CM

## 2017-04-25 DIAGNOSIS — Z0001 Encounter for general adult medical examination with abnormal findings: Secondary | ICD-10-CM

## 2017-04-25 DIAGNOSIS — I10 Essential (primary) hypertension: Secondary | ICD-10-CM

## 2017-04-25 DIAGNOSIS — J449 Chronic obstructive pulmonary disease, unspecified: Secondary | ICD-10-CM

## 2017-04-25 DIAGNOSIS — K7469 Other cirrhosis of liver: Secondary | ICD-10-CM | POA: Diagnosis not present

## 2017-04-25 DIAGNOSIS — R29898 Other symptoms and signs involving the musculoskeletal system: Secondary | ICD-10-CM

## 2017-04-25 DIAGNOSIS — E782 Mixed hyperlipidemia: Secondary | ICD-10-CM

## 2017-04-25 DIAGNOSIS — Z0000 Encounter for general adult medical examination without abnormal findings: Secondary | ICD-10-CM

## 2017-04-25 DIAGNOSIS — Z936 Other artificial openings of urinary tract status: Secondary | ICD-10-CM

## 2017-04-25 DIAGNOSIS — E118 Type 2 diabetes mellitus with unspecified complications: Secondary | ICD-10-CM | POA: Diagnosis not present

## 2017-04-25 DIAGNOSIS — D509 Iron deficiency anemia, unspecified: Secondary | ICD-10-CM | POA: Diagnosis not present

## 2017-04-25 DIAGNOSIS — K31819 Angiodysplasia of stomach and duodenum without bleeding: Secondary | ICD-10-CM

## 2017-04-25 DIAGNOSIS — Z7189 Other specified counseling: Secondary | ICD-10-CM

## 2017-04-25 LAB — PROTIME-INR
INR: 1.2 ratio — ABNORMAL HIGH (ref 0.8–1.0)
PROTHROMBIN TIME: 13.1 s (ref 9.6–13.1)

## 2017-04-25 NOTE — Assessment & Plan Note (Addendum)
Chronic, stable, low HDL. Continue simvastatin. Consider decreased or off statin. The ASCVD Risk score Mikey Bussing DC Jr., et al., 2013) failed to calculate for the following reasons:   The 2013 ASCVD risk score is only valid for ages 77 to 80

## 2017-04-25 NOTE — Assessment & Plan Note (Addendum)
Concern for progression with worsening anemia and new hypoalbuminemia. Check PT/INR to check MELD score. Encouraged they call GI to schedule f/u appt.

## 2017-04-25 NOTE — Assessment & Plan Note (Addendum)
They have forms and are working on this at home. Would want oldest daughter Langley Gauss to be HCPOA.

## 2017-04-25 NOTE — Assessment & Plan Note (Addendum)
?  cirrhosis related.

## 2017-04-25 NOTE — Assessment & Plan Note (Addendum)
Progressive, worsening anemia - discussed concern for occult blood loss - see below. Will mail iFOB to patient (forgot to provide at office visit).

## 2017-04-25 NOTE — Telephone Encounter (Signed)
Noted.  I forgot to give him iFOB at office visit - can we mail this to him? I placed orders for it today. Thanks.

## 2017-04-25 NOTE — Assessment & Plan Note (Signed)
Chronic, stable. Some low readings recently - will stop spironolactone (latest addition).

## 2017-04-25 NOTE — Assessment & Plan Note (Signed)
Preventative protocols reviewed and updated unless pt declined. Discussed healthy diet and lifestyle.  

## 2017-04-25 NOTE — Patient Instructions (Addendum)
Schedule follow up with Dr Vicente Males [facility] at your convenience. Return in 3 months for follow up visit.  Stop metformin, stop spironolactone.  One more blood test today.  Work on Scientist, physiological.   Health Maintenance, Male A healthy lifestyle and preventive care is important for your health and wellness. Ask your health care provider about what schedule of regular examinations is right for you. What should I know about weight and diet? Eat a Healthy Diet  Eat plenty of vegetables, fruits, whole grains, low-fat dairy products, and lean protein.  Do not eat a lot of foods high in solid fats, added sugars, or salt.  Maintain a Healthy Weight Regular exercise can help you achieve or maintain a healthy weight. You should:  Do at least 150 minutes of exercise each week. The exercise should increase your heart rate and make you sweat (moderate-intensity exercise).  Do strength-training exercises at least twice a week.  Watch Your Levels of Cholesterol and Blood Lipids  Have your blood tested for lipids and cholesterol every 5 years starting at 82 years of age. If you are at high risk for heart disease, you should start having your blood tested when you are 82 years old. You may need to have your cholesterol levels checked more often if: ? Your lipid or cholesterol levels are high. ? You are older than 82 years of age. ? You are at high risk for heart disease.  What should I know about cancer screening? Many types of cancers can be detected early and may often be prevented. Lung Cancer  You should be screened every year for lung cancer if: ? You are a current smoker who has smoked for at least 30 years. ? You are a former smoker who has quit within the past 15 years.  Talk to your health care provider about your screening options, when you should start screening, and how often you should be screened.  Colorectal Cancer  Routine colorectal cancer screening usually begins at 82 years of age  and should be repeated every 5-10 years until you are 82 years old. You may need to be screened more often if early forms of precancerous polyps or small growths are found. Your health care provider may recommend screening at an earlier age if you have risk factors for colon cancer.  Your health care provider may recommend using home test kits to check for hidden blood in the stool.  A small camera at the end of a tube can be used to examine your colon (sigmoidoscopy or colonoscopy). This checks for the earliest forms of colorectal cancer.  Prostate and Testicular Cancer  Depending on your age and overall health, your health care provider may do certain tests to screen for prostate and testicular cancer.  Talk to your health care provider about any symptoms or concerns you have about testicular or prostate cancer.  Skin Cancer  Check your skin from head to toe regularly.  Tell your health care provider about any new moles or changes in moles, especially if: ? There is a change in a mole's size, shape, or color. ? You have a mole that is larger than a pencil eraser.  Always use sunscreen. Apply sunscreen liberally and repeat throughout the day.  Protect yourself by wearing long sleeves, pants, a wide-brimmed hat, and sunglasses when outside.  What should I know about heart disease, diabetes, and high blood pressure?  If you are 106-51 years of age, have your blood pressure checked every 3-5  years. If you are 92 years of age or older, have your blood pressure checked every year. You should have your blood pressure measured twice-once when you are at a hospital or clinic, and once when you are not at a hospital or clinic. Record the average of the two measurements. To check your blood pressure when you are not at a hospital or clinic, you can use: ? An automated blood pressure machine at a pharmacy. ? A home blood pressure monitor.  Talk to your health care provider about your target blood  pressure.  If you are between 67-68 years old, ask your health care provider if you should take aspirin to prevent heart disease.  Have regular diabetes screenings by checking your fasting blood sugar level. ? If you are at a normal weight and have a low risk for diabetes, have this test once every three years after the age of 66. ? If you are overweight and have a high risk for diabetes, consider being tested at a younger age or more often.  A one-time screening for abdominal aortic aneurysm (AAA) by ultrasound is recommended for men aged 69-75 years who are current or former smokers. What should I know about preventing infection? Hepatitis B If you have a higher risk for hepatitis B, you should be screened for this virus. Talk with your health care provider to find out if you are at risk for hepatitis B infection. Hepatitis C Blood testing is recommended for:  Everyone born from [birth year] through 1963.  Anyone with known risk factors for hepatitis C.  Sexually Transmitted Diseases (STDs)  You should be screened each year for STDs including gonorrhea and chlamydia if: ? You are sexually active and are younger than 82 years of age. ? You are older than 82 years of age and your health care provider tells you that you are at risk for this type of infection. ? Your sexual activity has changed since you were last screened and you are at an increased risk for chlamydia or gonorrhea. Ask your health care provider if you are at risk.  Talk with your health care provider about whether you are at high risk of being infected with HIV. Your health care provider may recommend a prescription medicine to help prevent HIV infection.  What else can I do?  Schedule regular health, dental, and eye exams.  Stay current with your vaccines (immunizations).  Do not use any tobacco products, such as cigarettes, chewing tobacco, and e-cigarettes. If you need help quitting, ask your health care  provider.  Limit alcohol intake to no more than 2 drinks per day. One drink equals 12 ounces of beer, 5 ounces of wine, or 1 ounces of hard liquor.  Do not use street drugs.  Do not share needles.  Ask your health care provider for help if you need support or information about quitting drugs.  Tell your health care provider if you often feel depressed.  Tell your health care provider if you have ever been abused or do not feel safe at home. This information is not intended to replace advice given to you by your health care provider. Make sure you discuss any questions you have with your health care provider. Document Released: 07/10/2007 Document Revised: 09/10/2015 Document Reviewed: 10/15/2014 Elsevier Interactive Patient Education  [employer].

## 2017-04-25 NOTE — Telephone Encounter (Signed)
Pt's daughter, [name], wanted to inform Dr. Darnell Level the pt is having tremors when waking in the mornings.  Says she forgot to mention that during today's visit.

## 2017-04-25 NOTE — Assessment & Plan Note (Addendum)
H/o this. Now with progressive anemia - I encouraged GI f/u.

## 2017-04-25 NOTE — Telephone Encounter (Signed)
Mailed iFOB kit to pt.

## 2017-04-25 NOTE — Assessment & Plan Note (Signed)
Progressive worsening - could be progression of liver disease, progression of anemia, or other.

## 2017-04-25 NOTE — Progress Notes (Signed)
BP 124/64 (BP Location: Left Arm, Patient Position: Sitting, Cuff Size: Normal)   Pulse (!) 58   Temp 97.9 F (36.6 C) (Oral)   Ht 5\' 8"  (1.727 m)   Wt 206 lb (93.4 kg)   SpO2 93%   BMI 31.32 kg/m    CC: CPE Subjective:    Patient ID: [name], male    DOB: [birth year], 84 y.o.   MRN: [id number]  HPI: GERRALD BASU is a 84 y.o. male presenting on 04/25/2017 for Annual Exam (Pt 2. Pt accompanied by daughter, [name].)   Annia Belt last week for medicare wellness visit. Note reviewed. Failed hearing screen.  Has seen Dr Lucky Cowboy [facility], rec continued graduated compression stockings class 1. Discussing laser ablation of bilateral great saphenous veins - next week.   For pedal edema continues lasix 40/20mg , spironolactone 25mg  daily.  Several week of intermittent L arm soreness. Denies inciting trauma/injury. Sleeps on his left side.  Progressively worsening leg weakness.   Preventative: Colonoscopy 2008 - good for 10 yrs per records - aged out. Prostate screening - s/p cystectomy and prostatectomy for bladder cancer with urostomy in place.  Flu shot - yearly Pneumovax 2006, prevnar - 2016 Td 2004 Hep A/B series completed  zostavax - declined shingrix - declines Advanced directives: completed living will at home - will bring copy.HCPOA would be oldest daughter Langley Gauss). Seat belt use discussed Sunscreen use discussed. No changing moles on skin. Sees derm regularly, has had several skin surgeries this past year. Ex smoker Alcohol - none  Daily caffeine use - 1-2 cups/day coffee Lives with wife.  7 children Occ: retired, was Administrator. Activity: no regular exercise Diet: good water, fruits/vegetables daily  [facility] - Dr Lucky Cowboy Pulm - Dr Raul Del GI - Dr Vicente Males  Relevant past medical, surgical, family and social history reviewed and updated as indicated. Interim medical history since our last visit reviewed. Allergies and medications reviewed and updated. Outpatient Medications  Prior to Visit  Medication Sig Dispense Refill  . acetaminophen (TYLENOL) 325 MG tablet Take 325 mg by mouth every 6 (six) hours as needed.    Jearl Klinefelter ELLIPTA 62.5-25 MCG/INH AEPB   0  . cloNIDine (CATAPRES) 0.2 MG tablet TAKE 1 TABLET BY MOUTH TWICE A DAY 180 tablet 0  . doxazosin (CARDURA) 8 MG tablet TAKE 1/2 TABLET BY MOUTH TWICE A DAY 90 tablet 0  . ferrous sulfate (FERRO-BOB) 325 (65 FE) MG tablet Take 1 tablet (325 mg total) by mouth daily with breakfast. (Patient taking differently: Take 325 mg by mouth daily. )    . furosemide (LASIX) 20 MG tablet TAKE 2 TABLETS BY MOUTH EVERY MORNING AND 1 TABLET IN THE AFTERNOON AS DIRECTED 90 tablet 0  . glucose blood (ONE TOUCH ULTRA TEST) test strip Use to test sugar once daily and as needed Dx: E11.8 100 each 1  . losartan (COZAAR) 100 MG tablet take 1 tablet by mouth once daily 90 tablet 0  . Magnesium Oxide 400 (240 Mg) MG TABS Take 1 tablet (400 mg total) by mouth daily. 90 tablet 3  . nadolol (CORGARD) 40 MG tablet TAKE 1 TABLET BY MOUTH ONCE DAILY 90 tablet 0  . ONE TOUCH LANCETS MISC One daily and as needed/ ICD code 250.00     . pantoprazole (PROTONIX) 40 MG tablet take 1 tablet by mouth once daily 90 tablet 3  . potassium chloride (K-DUR,KLOR-CON) 10 MEQ tablet Take 10 mEq by mouth daily.   1  .  Respiratory Therapy Supplies (NEBULIZER) DEVI by Does not apply route. Use as directed     . simvastatin (ZOCOR) 20 MG tablet TAKE 1 TABLET BY MOUTH EVERY DAY 90 tablet 0  . tiotropium (SPIRIVA) 18 MCG inhalation capsule Place 18 mcg into inhaler and inhale daily.    Marland Kitchen triamcinolone cream (KENALOG) 0.1 % Apply 1 application topically 2 (two) times daily. Apply to AA. 30 g 0  . vitamin B-12 (CYANOCOBALAMIN) 250 MCG tablet Take 250 mcg by mouth daily.    . metFORMIN (GLUCOPHAGE) 500 MG tablet TAKE 1/2 TABLET BY MOUTH ONCE DAILY WITH BREAKFAST 45 tablet 0  . spironolactone (ALDACTONE) 25 MG tablet TAKE 1 TABLET BY MOUTH ONCE DAILY 30 tablet 0   No  facility-administered medications prior to visit.      Per HPI unless specifically indicated in ROS section below Review of Systems  Constitutional: Negative for activity change, appetite change, chills, fatigue, fever and unexpected weight change.  HENT: Negative for hearing loss.   Eyes: Negative for visual disturbance.  Respiratory: Positive for shortness of breath (mild). Negative for cough, chest tightness and wheezing.   Cardiovascular: Positive for leg swelling (chronic). Negative for chest pain and palpitations.  Gastrointestinal: Negative for abdominal distention, abdominal pain, blood in stool, constipation, diarrhea, nausea and vomiting.  Endocrine: Positive for cold intolerance.  Genitourinary: Negative for difficulty urinating and hematuria.  Musculoskeletal: Positive for gait problem (unsteady, uses walker/cane). Negative for arthralgias, myalgias and neck pain.  Skin: Negative for rash.  Neurological: Negative for dizziness, seizures, syncope and headaches.  Hematological: Negative for adenopathy. Does not bruise/bleed easily.  Psychiatric/Behavioral: Negative for dysphoric mood. The patient is not nervous/anxious.        Objective:    BP 124/64 (BP Location: Left Arm, Patient Position: Sitting, Cuff Size: Normal)   Pulse (!) 58   Temp 97.9 F (36.6 C) (Oral)   Ht 5\' 8"  (1.727 m)   Wt 206 lb (93.4 kg)   SpO2 93%   BMI 31.32 kg/m   Wt Readings from Last 3 Encounters:  04/25/17 206 lb (93.4 kg)  04/19/17 205 lb 4 oz (93.1 kg)  02/25/17 207 lb 6.4 oz (94.1 kg)    Physical Exam  Constitutional: He is oriented to person, place, and time. He appears well-developed and well-nourished. No distress.  HENT:  Head: Normocephalic and atraumatic.  Right Ear: Hearing, tympanic membrane, external ear and ear canal normal.  Left Ear: Hearing, tympanic membrane, external ear and ear canal normal.  Nose: Nose normal.  Mouth/Throat: Uvula is midline, oropharynx is clear and  moist and mucous membranes are normal. No oropharyngeal exudate, posterior oropharyngeal edema or posterior oropharyngeal erythema.  Eyes: Pupils are equal, round, and reactive to light. Conjunctivae and EOM are normal. No scleral icterus.  Neck: Normal range of motion. Neck supple. No thyromegaly present.  Cardiovascular: Normal rate, regular rhythm, normal heart sounds and intact distal pulses.  No murmur heard. Pulses:      Radial pulses are 2+ on the right side, and 2+ on the left side.  Pulmonary/Chest: Effort normal and breath sounds normal. No respiratory distress. He has no wheezes. He has no rales.  Abdominal: Soft. Bowel sounds are normal. He exhibits no distension and no mass. There is no tenderness. There is no rebound and no guarding.  Urostomy bag c/d/i  Musculoskeletal: Normal range of motion. He exhibits no edema.  Lymphadenopathy:    He has no cervical adenopathy.  Neurological: He is alert and oriented  to person, place, and time.  CN grossly intact, station and gait intact  Skin: Skin is warm and dry. No rash noted.  Psychiatric: He has a normal mood and affect. His behavior is normal. Judgment and thought content normal.  Nursing note and vitals reviewed.  Results for orders placed or performed in visit on 04/25/17  Protime-INR  Result Value Ref Range   INR 1.2 (H) 0.8 - 1.0 ratio   Prothrombin Time 13.1 9.6 - 13.1 sec      Assessment & Plan:   Problem List Items Addressed This Visit    Advanced care planning/counseling discussion    They have forms and are working on this at home. Would want oldest daughter Langley Gauss to be HCPOA.       Bilateral leg weakness    Progressive worsening - could be progression of liver disease, progression of anemia, or other.       Cirrhosis, cryptogenic ([place])    Concern for progression with worsening anemia and new hypoalbuminemia. Check PT/INR to check MELD score. Encouraged they call GI to schedule f/u appt.       Relevant  Orders   Protime-INR (Completed)   Controlled diabetes mellitus type 2 with complications (HCC)    Chronic, stable. Fructosamine likely more reliable than A1c given pancytopenia. However fructosamine likely also affected by deteriorated liver synthesis function - would go by cbg's in this patient. Will stop metformin at this time given deteriorated kidney function      COPD, severe ([place])    Continue f/u with pulm. On spiriva and anoro.      Essential hypertension    Chronic, stable. Some low readings recently - will stop spironolactone (latest addition).       Gastric and duodenal angiodysplasia    H/o this. Now with progressive anemia - I encouraged GI f/u.       Health maintenance examination - Primary    Preventative protocols reviewed and updated unless pt declined. Discussed healthy diet and lifestyle.       HLD (hyperlipidemia)    Chronic, stable, low HDL. Continue simvastatin. Consider decreased or off statin. The ASCVD Risk score Mikey Bussing DC Jr., et al., 2013) failed to calculate for the following reasons:   The 2013 ASCVD risk score is only valid for ages 53 to 36       Iron deficiency anemia    Progressive, worsening anemia - discussed concern for occult blood loss - see below. Will mail iFOB to patient (forgot to provide at office visit).       Relevant Orders   Fecal occult blood, imunochemical   Pancytopenia ([place])    ?cirrhosis related.       Presence of urostomy ([place])       No orders of the defined types were placed in this encounter.  Orders Placed This Encounter  Procedures  . Fecal occult blood, imunochemical    Standing Status:   Future    Standing Expiration Date:   04/26/2018  . Protime-INR    Follow up plan: Return in about 3 months (around 07/25/2017) for follow up visit.  Ria Bush, MD

## 2017-04-25 NOTE — Assessment & Plan Note (Signed)
Chronic, stable. Fructosamine likely more reliable than A1c given pancytopenia. However fructosamine likely also affected by deteriorated liver synthesis function - would go by cbg's in this patient. Will stop metformin at this time given deteriorated kidney function

## 2017-04-25 NOTE — Assessment & Plan Note (Addendum)
Continue f/u with pulm. On spiriva and anoro.

## 2017-04-26 ENCOUNTER — Encounter (INDEPENDENT_AMBULATORY_CARE_PROVIDER_SITE_OTHER): Payer: PPO

## 2017-04-26 NOTE — Progress Notes (Signed)
I reviewed health advisor's note, was available for consultation, and agree with documentation and plan.  

## 2017-05-03 ENCOUNTER — Other Ambulatory Visit (INDEPENDENT_AMBULATORY_CARE_PROVIDER_SITE_OTHER): Payer: PPO | Admitting: Vascular Surgery

## 2017-05-06 ENCOUNTER — Encounter (INDEPENDENT_AMBULATORY_CARE_PROVIDER_SITE_OTHER): Payer: PPO

## 2017-05-08 DIAGNOSIS — J449 Chronic obstructive pulmonary disease, unspecified: Secondary | ICD-10-CM | POA: Diagnosis not present

## 2017-05-09 ENCOUNTER — Other Ambulatory Visit: Payer: Self-pay | Admitting: Family Medicine

## 2017-05-09 DIAGNOSIS — Z85828 Personal history of other malignant neoplasm of skin: Secondary | ICD-10-CM | POA: Diagnosis not present

## 2017-05-09 DIAGNOSIS — Z08 Encounter for follow-up examination after completed treatment for malignant neoplasm: Secondary | ICD-10-CM | POA: Diagnosis not present

## 2017-05-09 DIAGNOSIS — L821 Other seborrheic keratosis: Secondary | ICD-10-CM | POA: Diagnosis not present

## 2017-05-09 DIAGNOSIS — D485 Neoplasm of uncertain behavior of skin: Secondary | ICD-10-CM | POA: Diagnosis not present

## 2017-05-10 DIAGNOSIS — L820 Inflamed seborrheic keratosis: Secondary | ICD-10-CM | POA: Diagnosis not present

## 2017-05-10 DIAGNOSIS — C44519 Basal cell carcinoma of skin of other part of trunk: Secondary | ICD-10-CM | POA: Diagnosis not present

## 2017-05-18 LAB — FECAL OCCULT BLOOD, GUAIAC: Fecal Occult Blood: NEGATIVE

## 2017-05-19 ENCOUNTER — Other Ambulatory Visit: Payer: Self-pay | Admitting: Family Medicine

## 2017-05-19 ENCOUNTER — Other Ambulatory Visit (INDEPENDENT_AMBULATORY_CARE_PROVIDER_SITE_OTHER): Payer: PPO

## 2017-05-19 ENCOUNTER — Encounter: Payer: Self-pay | Admitting: Family Medicine

## 2017-05-19 DIAGNOSIS — D509 Iron deficiency anemia, unspecified: Secondary | ICD-10-CM

## 2017-05-19 LAB — FECAL OCCULT BLOOD, IMMUNOCHEMICAL: FECAL OCCULT BLD: NEGATIVE

## 2017-05-20 ENCOUNTER — Ambulatory Visit (INDEPENDENT_AMBULATORY_CARE_PROVIDER_SITE_OTHER): Payer: PPO | Admitting: Vascular Surgery

## 2017-05-20 ENCOUNTER — Encounter (INDEPENDENT_AMBULATORY_CARE_PROVIDER_SITE_OTHER): Payer: Self-pay | Admitting: Vascular Surgery

## 2017-05-20 VITALS — BP 114/46 | HR 54 | Resp 14 | Ht 70.0 in | Wt 215.0 lb

## 2017-05-20 DIAGNOSIS — I83893 Varicose veins of bilateral lower extremities with other complications: Secondary | ICD-10-CM

## 2017-05-20 NOTE — Assessment & Plan Note (Addendum)
   The patient's right lower extremity was sterilely prepped and draped. The ultrasound machine was used to visualize the saphenous vein throughout its course. A segment just below the knee was selected for access. The saphenous vein was accessed with minimal difficulty using ultrasound guidance with a micro puncture needle. A micro puncture wire and sheath were then placed. A 0.018 wire was placed beyond the saphenofemoral junction through the sheath and the micro puncture sheath was removed. The 65 cm sheath was then placed over the wire and the wire and dilator were removed. The laser fiber was placed through the sheath and its tip was placed approximately 4-5 cm below the saphenofemoral junction. Tumescent anesthesia was then created with a dilute lidocaine solution. Laser energy was then delivered with constant withdrawal of the sheath and laser fiber. Approximately 1325 Joules of energy were delivered over a length of 36 cm using the 1470 Hz VenaCure machine at [employer]. Sterile dressings were placed. The patient tolerated the procedure well without complications.

## 2017-05-20 NOTE — Progress Notes (Signed)
Varicose veins of both legs with edema   The patient's right lower extremity was sterilely prepped and draped. The ultrasound machine was used to visualize the saphenous vein throughout its course. A segment just below the knee was selected for access. The saphenous vein was accessed with minimal difficulty using ultrasound guidance with a micro puncture needle. A micro puncture wire and sheath were then placed. A 0.018 wire was placed beyond the saphenofemoral junction through the sheath and the micro puncture sheath was removed. The 65 cm sheath was then placed over the wire and the wire and dilator were removed. The laser fiber was placed through the sheath and its tip was placed approximately 4-5 cm below the saphenofemoral junction. Tumescent anesthesia was then created with a dilute lidocaine solution. Laser energy was then delivered with constant withdrawal of the sheath and laser fiber. Approximately 1325 Joules of energy were delivered over a length of 36 cm using the 1470 Hz VenaCure machine at [employer]. Sterile dressings were placed. The patient tolerated the procedure well without complications.

## 2017-05-24 ENCOUNTER — Ambulatory Visit (INDEPENDENT_AMBULATORY_CARE_PROVIDER_SITE_OTHER): Payer: PPO

## 2017-05-24 DIAGNOSIS — I83893 Varicose veins of bilateral lower extremities with other complications: Secondary | ICD-10-CM | POA: Diagnosis not present

## 2017-05-30 ENCOUNTER — Emergency Department: Payer: PPO

## 2017-05-30 ENCOUNTER — Other Ambulatory Visit: Payer: Self-pay | Admitting: Family Medicine

## 2017-05-30 ENCOUNTER — Inpatient Hospital Stay
Admission: EM | Admit: 2017-05-30 | Discharge: 2017-06-16 | DRG: 682 | Disposition: A | Discharge status: Home Health | Payer: PPO | Attending: Internal Medicine | Admitting: Internal Medicine

## 2017-05-30 DIAGNOSIS — Z8249 Family history of ischemic heart disease and other diseases of the circulatory system: Secondary | ICD-10-CM

## 2017-05-30 DIAGNOSIS — M1990 Unspecified osteoarthritis, unspecified site: Secondary | ICD-10-CM | POA: Diagnosis present

## 2017-05-30 DIAGNOSIS — Z7401 Bed confinement status: Secondary | ICD-10-CM | POA: Diagnosis not present

## 2017-05-30 DIAGNOSIS — I130 Hypertensive heart and chronic kidney disease with heart failure and stage 1 through stage 4 chronic kidney disease, or unspecified chronic kidney disease: Secondary | ICD-10-CM | POA: Diagnosis present

## 2017-05-30 DIAGNOSIS — I864 Gastric varices: Secondary | ICD-10-CM | POA: Diagnosis present

## 2017-05-30 DIAGNOSIS — I480 Paroxysmal atrial fibrillation: Secondary | ICD-10-CM | POA: Diagnosis present

## 2017-05-30 DIAGNOSIS — Z906 Acquired absence of other parts of urinary tract: Secondary | ICD-10-CM

## 2017-05-30 DIAGNOSIS — I700 Atherosclerosis of aorta: Secondary | ICD-10-CM | POA: Diagnosis present

## 2017-05-30 DIAGNOSIS — K648 Other hemorrhoids: Secondary | ICD-10-CM | POA: Diagnosis present

## 2017-05-30 DIAGNOSIS — Z7189 Other specified counseling: Secondary | ICD-10-CM | POA: Diagnosis not present

## 2017-05-30 DIAGNOSIS — J9621 Acute and chronic respiratory failure with hypoxia: Secondary | ICD-10-CM | POA: Diagnosis not present

## 2017-05-30 DIAGNOSIS — I5033 Acute on chronic diastolic (congestive) heart failure: Secondary | ICD-10-CM | POA: Diagnosis not present

## 2017-05-30 DIAGNOSIS — K7469 Other cirrhosis of liver: Secondary | ICD-10-CM | POA: Diagnosis present

## 2017-05-30 DIAGNOSIS — Z515 Encounter for palliative care: Secondary | ICD-10-CM | POA: Diagnosis not present

## 2017-05-30 DIAGNOSIS — N183 Chronic kidney disease, stage 3 (moderate): Secondary | ICD-10-CM | POA: Diagnosis present

## 2017-05-30 DIAGNOSIS — J9600 Acute respiratory failure, unspecified whether with hypoxia or hypercapnia: Secondary | ICD-10-CM | POA: Diagnosis not present

## 2017-05-30 DIAGNOSIS — I482 Chronic atrial fibrillation: Secondary | ICD-10-CM | POA: Diagnosis present

## 2017-05-30 DIAGNOSIS — Z66 Do not resuscitate: Secondary | ICD-10-CM | POA: Diagnosis not present

## 2017-05-30 DIAGNOSIS — K7460 Unspecified cirrhosis of liver: Secondary | ICD-10-CM | POA: Diagnosis not present

## 2017-05-30 DIAGNOSIS — Z452 Encounter for adjustment and management of vascular access device: Secondary | ICD-10-CM | POA: Diagnosis not present

## 2017-05-30 DIAGNOSIS — J810 Acute pulmonary edema: Secondary | ICD-10-CM | POA: Diagnosis not present

## 2017-05-30 DIAGNOSIS — E860 Dehydration: Secondary | ICD-10-CM | POA: Diagnosis present

## 2017-05-30 DIAGNOSIS — S0990XA Unspecified injury of head, initial encounter: Secondary | ICD-10-CM | POA: Diagnosis not present

## 2017-05-30 DIAGNOSIS — G8929 Other chronic pain: Secondary | ICD-10-CM | POA: Diagnosis present

## 2017-05-30 DIAGNOSIS — J9622 Acute and chronic respiratory failure with hypercapnia: Secondary | ICD-10-CM | POA: Diagnosis not present

## 2017-05-30 DIAGNOSIS — J9690 Respiratory failure, unspecified, unspecified whether with hypoxia or hypercapnia: Secondary | ICD-10-CM

## 2017-05-30 DIAGNOSIS — D6959 Other secondary thrombocytopenia: Secondary | ICD-10-CM | POA: Diagnosis present

## 2017-05-30 DIAGNOSIS — R609 Edema, unspecified: Secondary | ICD-10-CM | POA: Diagnosis not present

## 2017-05-30 DIAGNOSIS — R001 Bradycardia, unspecified: Secondary | ICD-10-CM | POA: Diagnosis present

## 2017-05-30 DIAGNOSIS — I5021 Acute systolic (congestive) heart failure: Secondary | ICD-10-CM | POA: Diagnosis not present

## 2017-05-30 DIAGNOSIS — N189 Chronic kidney disease, unspecified: Secondary | ICD-10-CM | POA: Diagnosis not present

## 2017-05-30 DIAGNOSIS — Z9079 Acquired absence of other genital organ(s): Secondary | ICD-10-CM

## 2017-05-30 DIAGNOSIS — M6281 Muscle weakness (generalized): Secondary | ICD-10-CM | POA: Diagnosis not present

## 2017-05-30 DIAGNOSIS — J189 Pneumonia, unspecified organism: Secondary | ICD-10-CM

## 2017-05-30 DIAGNOSIS — N170 Acute kidney failure with tubular necrosis: Principal | ICD-10-CM | POA: Diagnosis present

## 2017-05-30 DIAGNOSIS — Z8701 Personal history of pneumonia (recurrent): Secondary | ICD-10-CM

## 2017-05-30 DIAGNOSIS — K2971 Gastritis, unspecified, with bleeding: Secondary | ICD-10-CM | POA: Diagnosis present

## 2017-05-30 DIAGNOSIS — N139 Obstructive and reflux uropathy, unspecified: Secondary | ICD-10-CM | POA: Diagnosis present

## 2017-05-30 DIAGNOSIS — E785 Hyperlipidemia, unspecified: Secondary | ICD-10-CM | POA: Diagnosis present

## 2017-05-30 DIAGNOSIS — Z6835 Body mass index (BMI) 35.0-35.9, adult: Secondary | ICD-10-CM

## 2017-05-30 DIAGNOSIS — I10 Essential (primary) hypertension: Secondary | ICD-10-CM | POA: Diagnosis not present

## 2017-05-30 DIAGNOSIS — Z9911 Dependence on respirator [ventilator] status: Secondary | ICD-10-CM | POA: Diagnosis not present

## 2017-05-30 DIAGNOSIS — N281 Cyst of kidney, acquired: Secondary | ICD-10-CM | POA: Diagnosis not present

## 2017-05-30 DIAGNOSIS — E875 Hyperkalemia: Secondary | ICD-10-CM | POA: Diagnosis not present

## 2017-05-30 DIAGNOSIS — J440 Chronic obstructive pulmonary disease with acute lower respiratory infection: Secondary | ICD-10-CM | POA: Diagnosis not present

## 2017-05-30 DIAGNOSIS — M25512 Pain in left shoulder: Secondary | ICD-10-CM | POA: Diagnosis present

## 2017-05-30 DIAGNOSIS — E8770 Fluid overload, unspecified: Secondary | ICD-10-CM

## 2017-05-30 DIAGNOSIS — I132 Hypertensive heart and chronic kidney disease with heart failure and with stage 5 chronic kidney disease, or end stage renal disease: Secondary | ICD-10-CM | POA: Diagnosis not present

## 2017-05-30 DIAGNOSIS — E872 Acidosis: Secondary | ICD-10-CM | POA: Diagnosis not present

## 2017-05-30 DIAGNOSIS — Z833 Family history of diabetes mellitus: Secondary | ICD-10-CM

## 2017-05-30 DIAGNOSIS — J9811 Atelectasis: Secondary | ICD-10-CM | POA: Diagnosis not present

## 2017-05-30 DIAGNOSIS — R531 Weakness: Secondary | ICD-10-CM

## 2017-05-30 DIAGNOSIS — K921 Melena: Secondary | ICD-10-CM | POA: Diagnosis not present

## 2017-05-30 DIAGNOSIS — R4020 Unspecified coma: Secondary | ICD-10-CM | POA: Diagnosis not present

## 2017-05-30 DIAGNOSIS — I129 Hypertensive chronic kidney disease with stage 1 through stage 4 chronic kidney disease, or unspecified chronic kidney disease: Secondary | ICD-10-CM | POA: Diagnosis not present

## 2017-05-30 DIAGNOSIS — I959 Hypotension, unspecified: Secondary | ICD-10-CM | POA: Diagnosis present

## 2017-05-30 DIAGNOSIS — Z936 Other artificial openings of urinary tract status: Secondary | ICD-10-CM

## 2017-05-30 DIAGNOSIS — R0600 Dyspnea, unspecified: Secondary | ICD-10-CM | POA: Diagnosis not present

## 2017-05-30 DIAGNOSIS — Z87891 Personal history of nicotine dependence: Secondary | ICD-10-CM

## 2017-05-30 DIAGNOSIS — J9601 Acute respiratory failure with hypoxia: Secondary | ICD-10-CM | POA: Diagnosis not present

## 2017-05-30 DIAGNOSIS — A419 Sepsis, unspecified organism: Secondary | ICD-10-CM | POA: Diagnosis not present

## 2017-05-30 DIAGNOSIS — R188 Other ascites: Secondary | ICD-10-CM | POA: Diagnosis present

## 2017-05-30 DIAGNOSIS — R6521 Severe sepsis with septic shock: Secondary | ICD-10-CM | POA: Diagnosis not present

## 2017-05-30 DIAGNOSIS — E1151 Type 2 diabetes mellitus with diabetic peripheral angiopathy without gangrene: Secondary | ICD-10-CM | POA: Diagnosis not present

## 2017-05-30 DIAGNOSIS — E119 Type 2 diabetes mellitus without complications: Secondary | ICD-10-CM | POA: Diagnosis not present

## 2017-05-30 DIAGNOSIS — J90 Pleural effusion, not elsewhere classified: Secondary | ICD-10-CM | POA: Diagnosis not present

## 2017-05-30 DIAGNOSIS — K922 Gastrointestinal hemorrhage, unspecified: Secondary | ICD-10-CM

## 2017-05-30 DIAGNOSIS — Z789 Other specified health status: Secondary | ICD-10-CM

## 2017-05-30 DIAGNOSIS — N186 End stage renal disease: Secondary | ICD-10-CM | POA: Diagnosis not present

## 2017-05-30 DIAGNOSIS — M7989 Other specified soft tissue disorders: Secondary | ICD-10-CM | POA: Diagnosis not present

## 2017-05-30 DIAGNOSIS — N19 Unspecified kidney failure: Secondary | ICD-10-CM

## 2017-05-30 DIAGNOSIS — E1122 Type 2 diabetes mellitus with diabetic chronic kidney disease: Secondary | ICD-10-CM | POA: Diagnosis present

## 2017-05-30 DIAGNOSIS — N179 Acute kidney failure, unspecified: Secondary | ICD-10-CM | POA: Diagnosis present

## 2017-05-30 DIAGNOSIS — Z85828 Personal history of other malignant neoplasm of skin: Secondary | ICD-10-CM

## 2017-05-30 DIAGNOSIS — D631 Anemia in chronic kidney disease: Secondary | ICD-10-CM | POA: Diagnosis present

## 2017-05-30 DIAGNOSIS — E876 Hypokalemia: Secondary | ICD-10-CM | POA: Diagnosis not present

## 2017-05-30 DIAGNOSIS — Z8551 Personal history of malignant neoplasm of bladder: Secondary | ICD-10-CM

## 2017-05-30 DIAGNOSIS — K219 Gastro-esophageal reflux disease without esophagitis: Secondary | ICD-10-CM | POA: Diagnosis present

## 2017-05-30 DIAGNOSIS — J449 Chronic obstructive pulmonary disease, unspecified: Secondary | ICD-10-CM | POA: Diagnosis not present

## 2017-05-30 DIAGNOSIS — R600 Localized edema: Secondary | ICD-10-CM | POA: Diagnosis not present

## 2017-05-30 DIAGNOSIS — K2970 Gastritis, unspecified, without bleeding: Secondary | ICD-10-CM | POA: Diagnosis not present

## 2017-05-30 DIAGNOSIS — I509 Heart failure, unspecified: Secondary | ICD-10-CM | POA: Diagnosis not present

## 2017-05-30 DIAGNOSIS — E669 Obesity, unspecified: Secondary | ICD-10-CM | POA: Diagnosis present

## 2017-05-30 DIAGNOSIS — Z9981 Dependence on supplemental oxygen: Secondary | ICD-10-CM

## 2017-05-30 MED ORDER — SODIUM CHLORIDE 0.9 % IV BOLUS
1000.0000 mL | Freq: Once | INTRAVENOUS | Status: AC
  Administered 2017-05-31: 1000 mL via INTRAVENOUS

## 2017-05-30 NOTE — ED Provider Notes (Signed)
[facility] Emergency Department Provider Note   ____________________________________________   First MD Initiated Contact with Patient 05/30/17 2346     (approximate)  I have reviewed the triage vital signs and the nursing notes.   HISTORY  Chief Complaint Weakness    HPI [name] is a 84 y.o. male brought to the ED from home via EMS with a chief complaint of generalized weakness.  Patient has a history of atrial fibrillation, not on anticoagulation, COPD, CHF, history of GI bleed who complains of a 1 day history of generalized weakness.  States he was so weak he was unable to ambulate tonight.  Reportedly he slid off the bed and was unable to get back up.  On EMS arrival patient was bradycardic with a heart rate in the 40s and hypotensive with systolic blood pressure in the 70s.  Patient voices no complaints.  Specifically, denies recent fever, chills, chest pain, shortness of breath, abdominal pain, nausea, vomiting.  Denies recent travel or trauma.   Past Medical History:  Diagnosis Date  . Arthritis   . Atrial fibrillation ([place]) 06/23/2010  . B12 deficiency   . C. difficile colitis 07/06/2011  . CHF (congestive heart failure) (HCC)    diastolic. Echo 5/10 with mild LVH, EF 81%, grade 1 diastolic dysfunction, severe left atrial enlargement, aortic sclerosis  . Chronic a-fib (HCC)    on pradaxa, declined cardioversion in past  . COPD (chronic obstructive pulmonary disease) ([place])    (Dr. Raul Del)  . Cryptogenic cirrhosis ([place])    Followed by Dr Fuller Plan, never biopsied, has been stable.  History of gastric varices.  . Diabetes mellitus, type 2 ([place])   . Gastric and duodenal angiodysplasia 06/28/2011  . Gastric varices   . GI bleed   . Hemorrhoids   . History of bladder cancer 1984   s/p ileal conduit  . History of ileostomy   . History of pneumonia 2012  . Hyperlipidemia   . Hypertension   . Inguinal hernia    left  . Iron deficiency anemia     . Kidney atrophy 02/2016  . Pinched nerve    back  . Pyloric stenosis    mild EGD 10/26/06  . RLL pneumonia ([place]) 01/28/2014  . Shortness of breath     Patient Active Problem List   Diagnosis Date Noted  . Varicose veins of both legs with edema 11/26/2016  . Bilateral leg weakness 10/15/2016  . Cold intolerance 12/16/2015  . Generalized weakness 09/09/2015  . Health maintenance examination 03/12/2015  . Psoriasis 09/09/2014  . Advanced care planning/counseling discussion 02/12/2014  . Presence of urostomy ([place]) 08/02/2013  . Medicare annual wellness visit, subsequent 02/09/2013  . Leg edema 07/05/2012  . Radicular leg pain 12/07/2011  . Cirrhosis, cryptogenic ([place]) 07/06/2011  . Anti-Duffy a antibodies present 07/06/2011  . Duodenal nodule 07/06/2011  . Gastric varices 06/30/2011  . Gastric and duodenal angiodysplasia 06/28/2011  . Diastolic CHF ([place]) 01/75/1025  . Neoplasm of uncertain behavior of skin 07/30/2010  . COPD, severe ([place]) 07/30/2010  . Atrial fibrillation ([place]) 06/23/2010  . Pancytopenia ([employer]) 11/28/2007  . Iron deficiency anemia 04/03/2007  . INGUINAL HERNIA, LEFT 04/03/2007  . HEMORRHOIDS, INTERNAL 10/26/2006  . Controlled diabetes mellitus type 2 with complications ([place]) 85/27/7824  . HLD (hyperlipidemia) 07/14/2006  . Essential hypertension 07/14/2006  . AORTIC INSUFFICIENCY 07/14/2006    Past Surgical History:  Procedure Laterality Date  . BASAL CELL CARCINOMA EXCISION  2019   multiple  sites  . BE  08/08/2003  . BLADDER REMOVAL     s/p urostomy bag  . COLONOSCOPY  07/24/2003   Int. hemorrhoids  . COLONOSCOPY  10/26/2006   Int hemorrhoids, 10 yrs  . ESOPHAGOGASTRODUODENOSCOPY  10/26/2006   Gastric varices, pyloric stenosis  . ESOPHAGOGASTRODUODENOSCOPY  07/01/2011   Procedure: ESOPHAGOGASTRODUODENOSCOPY (EGD);  Surgeon: Jerene Bears, MD;  Location: [place];  Service: Gastroenterology;  Laterality: N/A;  . ESOPHAGOGASTRODUODENOSCOPY  06/28/2011    Procedure: ESOPHAGOGASTRODUODENOSCOPY (EGD);  Surgeon: Jerene Bears, MD;  Location: [place];  Service: Gastroenterology;  Laterality: N/A;  . FETAL BLOOD TRANSFUSION    . ILEOSTOMY  1984   bladder cancer  . PENILE PROSTHESIS IMPLANT  1990   inflatable implant  . PROSTATECTOMY  1984   bladder cancer  . US ECHOCARDIOGRAPHY  05/1995   EF 60% TR, AI    Prior to Admission medications   Medication Sig Start Date End Date Taking? Authorizing Provider  acetaminophen (TYLENOL) 325 MG tablet Take 325 mg by mouth every 6 (six) hours as needed.    [provider]  ALPRAZolam Duanne Moron) 0.5 MG tablet Take 0.5 mg by mouth at bedtime as needed for anxiety (For Laser procedure).    [provider]  Celedonio Miyamoto 62.5-25 MCG/INH AEPB  02/25/16   [provider]  cloNIDine (CATAPRES) 0.2 MG tablet TAKE 1 TABLET BY MOUTH TWICE A DAY 05/30/17   Ria Bush, MD  doxazosin (CARDURA) 8 MG tablet TAKE 1/2 TABLET BY MOUTH TWICE A DAY 03/14/17   Ria Bush, MD  ferrous sulfate (FERRO-BOB) 325 (65 FE) MG tablet Take 1 tablet (325 mg total) by mouth daily with breakfast. Patient taking differently: Take 325 mg by mouth daily.  07/06/11   Debbe Odea, MD  furosemide (LASIX) 20 MG tablet TAKE 2 TABLETS BY MOUTH EVERY MORNING AND 1 TABLET IN THE AFTERNOON AS DIRECTED 03/29/17   Ria Bush, MD  glucose blood (ONE TOUCH ULTRA TEST) test strip Use to test sugar once daily and as needed Dx: E11.8 03/15/17   Ria Bush, MD  losartan (COZAAR) 100 MG tablet TAKE 1 TABLET BY MOUTH ONCE DAILY 05/19/17   Ria Bush, MD  magnesium oxide (MAG-OX) 400 (241.3 Mg) MG tablet TK 1 T PO ONCE D 04/28/17   [provider]  nadolol (CORGARD) 40 MG tablet TAKE 1 TABLET BY MOUTH ONCE DAILY 03/14/17   Ria Bush, MD  ONE TOUCH LANCETS MISC One daily and as needed/ ICD code 250.00     [provider]  pantoprazole (PROTONIX) 40 MG tablet TAKE 1 TABLET BY MOUTH ONCE  DAILY 05/09/17   Ria Bush, MD  potassium chloride (K-DUR,KLOR-CON) 10 MEQ tablet Take 10 mEq by mouth daily.  03/15/16   [provider]  Respiratory Therapy Supplies (NEBULIZER) DEVI by Does not apply route. Use as directed     [provider]  simvastatin (ZOCOR) 20 MG tablet TAKE 1 TABLET BY MOUTH EVERY DAY 03/25/17   Ria Bush, MD  tiotropium (SPIRIVA) 18 MCG inhalation capsule Place 18 mcg into inhaler and inhale daily.    [provider]  triamcinolone cream (KENALOG) 0.1 % Apply 1 application topically 2 (two) times daily. Apply to [place]. 09/09/14   Ria Bush, MD  vitamin B-12 (CYANOCOBALAMIN) 250 MCG tablet Take 250 mcg by mouth daily.    [provider]    Allergies Patient has no known allergies.  Family History  Problem Relation Age of Onset  .  Hypertension Mother   . Osteoarthritis Mother   . Heart disease Father        MI  . Coronary artery disease Sister   . Diabetes Brother   . Heart disease Brother        MI  . Coronary artery disease Sister   . Stroke Other        GPs  . Colon cancer Neg Hx     Social History Social History   Tobacco Use  . Smoking status: Former Smoker    Packs/day: 1.00    Years: 45.00    Pack years: 45.00    Types: Cigarettes    Last attempt to quit: 06/22/1989    Years since quitting: 27.9  . Smokeless tobacco: Never Used  . Tobacco comment: Quit 1990  Substance Use Topics  . Alcohol use: No    Alcohol/week: 0.6 oz    Types: 1 Standard drinks or equivalent per week  . Drug use: No    Review of Systems  Constitutional: Positive for generalized weakness.  No fever/chills. Eyes: No visual changes. ENT: No sore throat. Cardiovascular: Denies chest pain. Respiratory: Denies shortness of breath. Gastrointestinal: No abdominal pain.  No nausea, no vomiting.  No diarrhea.  No constipation. Genitourinary: Negative for dysuria. Musculoskeletal: Negative for back pain. Skin:  Negative for rash. Neurological: Negative for headaches, focal weakness or numbness.   ____________________________________________   PHYSICAL EXAM:  VITAL SIGNS: ED Triage Vitals  Enc Vitals Group     BP      Pulse      Resp      Temp      Temp src      SpO2      Weight      Height      Head Circumference      Peak Flow      Pain Score      Pain Loc      Pain Edu?      Excl. in [place]?     Constitutional: Alert and oriented. Well appearing and in no acute distress. Eyes: Conjunctivae are normal. PERRL. EOMI. Head: Atraumatic. Nose: No congestion/rhinnorhea. Mouth/Throat: Mucous membranes are moist.  Oropharynx non-erythematous. Neck: No stridor.  No carotid bruits. Cardiovascular: Normal rate, regular rhythm. Grossly normal heart sounds.  Good peripheral circulation. Respiratory: Normal respiratory effort.  No retractions. Lungs CTAB. Gastrointestinal: Soft and nontender to light or deep palpation. No distention. No abdominal bruits. No CVA tenderness. Genitourinary: Urostomy in place. Musculoskeletal: No lower extremity tenderness. 1+ BLE pitting edema.  No joint effusions. Neurologic: Alert and oriented x3.  CN II - XII grossly intact.  Normal speech and language. MAEx4. No gross focal neurologic deficits are appreciated.  Skin:  Skin is warm, dry and intact. No rash noted. Psychiatric: Mood and affect are normal. Speech and behavior are normal.  ____________________________________________   LABS (all labs ordered are listed, but only abnormal results are displayed)  Labs Reviewed  CBC WITH DIFFERENTIAL/PLATELET - Abnormal; Notable for the following components:      Result Value   RBC 2.56 (*)    Hemoglobin 8.1 (*)    HCT 25.1 (*)    RDW 16.5 (*)    Platelets 121 (*)    Lymphs Abs 0.5 (*)    All other components within normal limits  COMPREHENSIVE METABOLIC PANEL - Abnormal; Notable for the following components:   Sodium 132 (*)    Potassium 7.2 (*)    CO2  21 (*)  Glucose, Bld 116 (*)    BUN 56 (*)    Creatinine, Ser 4.23 (*)    Calcium 7.8 (*)    Total Protein 5.8 (*)    Albumin 2.5 (*)    AST 45 (*)    ALT 16 (*)    GFR calc non Af Amer 12 (*)    GFR calc Af Amer 14 (*)    All other components within normal limits  BRAIN NATRIURETIC PEPTIDE - Abnormal; Notable for the following components:   B Natriuretic Peptide 487.0 (*)    All other components within normal limits  CULTURE, BLOOD (ROUTINE X 2)  CULTURE, BLOOD (ROUTINE X 2)  URINE CULTURE  TROPONIN I  LACTIC ACID, PLASMA  LACTIC ACID, PLASMA  URINALYSIS, COMPLETE (UACMP) WITH MICROSCOPIC  PROTIME-INR  TYPE AND SCREEN   ____________________________________________  EKG  ED ECG REPORT I, Paelyn Smick J, the attending physician, personally viewed and interpreted this ECG.   Date: 05/30/2017  EKG Time: 0037  Rate: 42  Rhythm: atrial fibrillation, rate 42  Axis: Normal  Intervals:right bundle branch block  ST&T Change: Nonspecific  ____________________________________________  RADIOLOGY  ED MD interpretation: Cardiomegaly with mild CHF  Official radiology report(s): Dg Chest Port 1 View  Result Date: 05/31/2017 CLINICAL DATA:  Weakness and declining health. EXAM: PORTABLE CHEST 1 VIEW COMPARISON:  None. FINDINGS: Cardiomegaly with moderate aortic atherosclerosis. Pulmonary vascular redistribution with small right effusion compatible with CHF. No acute osseous abnormality. IMPRESSION: Cardiomegaly with mild CHF and small right effusion. Electronically Signed   By: Ashley Royalty M.D.   On: 05/31/2017 00:29    ____________________________________________   PROCEDURES  Procedure(s) performed: None  Procedures  Critical Care performed: Yes, see critical care note(s)  CRITICAL CARE Performed by: Paulette Blanch   Total critical care time: 45 minutes  Critical care time was exclusive of separately billable procedures and treating other patients.  Critical care was  necessary to treat or prevent imminent or life-threatening deterioration.  Critical care was time spent personally by me on the following activities: development of treatment plan with patient and/or surrogate as well as nursing, discussions with consultants, evaluation of patient's response to treatment, examination of patient, obtaining history from patient or surrogate, ordering and performing treatments and interventions, ordering and review of laboratory studies, ordering and review of radiographic studies, pulse oximetry and re-evaluation of patient's condition. ____________________________________________   INITIAL IMPRESSION / ASSESSMENT AND PLAN / ED COURSE  As part of my medical decision making, I reviewed the following data within the [facility] notes reviewed and incorporated, Labs reviewed, EKG interpreted, Old chart reviewed, Radiograph reviewed, Discussed with admitting physician and Notes from prior ED visits   84 year old male with complex medical history who presents with generalized weakness, bradycardia and hypotension.  Differential diagnosis includes but is not limited to sepsis, UTI, CAD, CVA, etc.  Will obtain screening lab work including blood cultures and lactate, initiate IV fluid resuscitation.  Anticipate hospitalization.   Clinical Course as of Jun 01 103  Tue May 31, 2017  0029 Rectal exam: External exam within normal limits without external hemorrhoids.  Melanotic stool on gloved finger which is immediately heme positive.   [JS]  0032 Reviewed patient's prior office visits; his heart rate looks to be in the low to mid 57s.  Reviewed patient's medications; does not appear to be on beta-blocker.  Given history of esophageal varices and melanotic stool on exam, will initiate IV Protonix bolus and drip.   [  JS]  0045 Noted anemia which is similar to 1 month prior.  Will discuss with hospitalist to evaluate patient in the emergency department  for admission.   [JS]  [id number] Lab called with critical values for potassium and creatinine.  Medications ordered for hyperkalemia.   [JS]    Clinical Course User Index [JS] Paulette Blanch, MD     ____________________________________________   FINAL CLINICAL IMPRESSION(S) / ED DIAGNOSES  Final diagnoses:  Melena  Gastrointestinal hemorrhage, unspecified gastrointestinal hemorrhage type  Hypotension, unspecified hypotension type  Bradycardia  Weakness  Hyperkalemia  Acute renal failure, unspecified acute renal failure type [facility])     ED Discharge Orders    None       Note:  This document was prepared using Dragon voice recognition software and may include unintentional dictation errors.    Paulette Blanch, MD 05/31/17 [phone]

## 2017-05-31 ENCOUNTER — Inpatient Hospital Stay: Payer: PPO

## 2017-05-31 ENCOUNTER — Emergency Department: Payer: PPO

## 2017-05-31 ENCOUNTER — Encounter: Payer: Self-pay | Admitting: Emergency Medicine

## 2017-05-31 DIAGNOSIS — Z66 Do not resuscitate: Secondary | ICD-10-CM | POA: Diagnosis not present

## 2017-05-31 DIAGNOSIS — J9622 Acute and chronic respiratory failure with hypercapnia: Secondary | ICD-10-CM | POA: Diagnosis not present

## 2017-05-31 DIAGNOSIS — K2971 Gastritis, unspecified, with bleeding: Secondary | ICD-10-CM | POA: Diagnosis present

## 2017-05-31 DIAGNOSIS — R6521 Severe sepsis with septic shock: Secondary | ICD-10-CM | POA: Diagnosis not present

## 2017-05-31 DIAGNOSIS — E860 Dehydration: Secondary | ICD-10-CM | POA: Diagnosis present

## 2017-05-31 DIAGNOSIS — K7469 Other cirrhosis of liver: Secondary | ICD-10-CM | POA: Diagnosis present

## 2017-05-31 DIAGNOSIS — R001 Bradycardia, unspecified: Secondary | ICD-10-CM | POA: Diagnosis present

## 2017-05-31 DIAGNOSIS — J9621 Acute and chronic respiratory failure with hypoxia: Secondary | ICD-10-CM | POA: Diagnosis not present

## 2017-05-31 DIAGNOSIS — N189 Chronic kidney disease, unspecified: Secondary | ICD-10-CM | POA: Diagnosis not present

## 2017-05-31 DIAGNOSIS — K922 Gastrointestinal hemorrhage, unspecified: Secondary | ICD-10-CM

## 2017-05-31 DIAGNOSIS — I5021 Acute systolic (congestive) heart failure: Secondary | ICD-10-CM | POA: Diagnosis not present

## 2017-05-31 DIAGNOSIS — J810 Acute pulmonary edema: Secondary | ICD-10-CM | POA: Diagnosis not present

## 2017-05-31 DIAGNOSIS — K921 Melena: Secondary | ICD-10-CM | POA: Diagnosis not present

## 2017-05-31 DIAGNOSIS — I864 Gastric varices: Secondary | ICD-10-CM | POA: Diagnosis present

## 2017-05-31 DIAGNOSIS — I5033 Acute on chronic diastolic (congestive) heart failure: Secondary | ICD-10-CM | POA: Diagnosis not present

## 2017-05-31 DIAGNOSIS — E875 Hyperkalemia: Secondary | ICD-10-CM | POA: Diagnosis present

## 2017-05-31 DIAGNOSIS — J9600 Acute respiratory failure, unspecified whether with hypoxia or hypercapnia: Secondary | ICD-10-CM | POA: Diagnosis not present

## 2017-05-31 DIAGNOSIS — I130 Hypertensive heart and chronic kidney disease with heart failure and stage 1 through stage 4 chronic kidney disease, or unspecified chronic kidney disease: Secondary | ICD-10-CM | POA: Diagnosis present

## 2017-05-31 DIAGNOSIS — J9601 Acute respiratory failure with hypoxia: Secondary | ICD-10-CM | POA: Diagnosis not present

## 2017-05-31 DIAGNOSIS — E785 Hyperlipidemia, unspecified: Secondary | ICD-10-CM | POA: Diagnosis present

## 2017-05-31 DIAGNOSIS — E876 Hypokalemia: Secondary | ICD-10-CM | POA: Diagnosis not present

## 2017-05-31 DIAGNOSIS — J189 Pneumonia, unspecified organism: Secondary | ICD-10-CM | POA: Diagnosis not present

## 2017-05-31 DIAGNOSIS — I959 Hypotension, unspecified: Secondary | ICD-10-CM | POA: Diagnosis present

## 2017-05-31 DIAGNOSIS — N183 Chronic kidney disease, stage 3 (moderate): Secondary | ICD-10-CM | POA: Diagnosis present

## 2017-05-31 DIAGNOSIS — M1990 Unspecified osteoarthritis, unspecified site: Secondary | ICD-10-CM | POA: Diagnosis present

## 2017-05-31 DIAGNOSIS — K2970 Gastritis, unspecified, without bleeding: Secondary | ICD-10-CM | POA: Diagnosis not present

## 2017-05-31 DIAGNOSIS — I482 Chronic atrial fibrillation: Secondary | ICD-10-CM | POA: Diagnosis present

## 2017-05-31 DIAGNOSIS — D631 Anemia in chronic kidney disease: Secondary | ICD-10-CM | POA: Diagnosis present

## 2017-05-31 DIAGNOSIS — N179 Acute kidney failure, unspecified: Secondary | ICD-10-CM | POA: Diagnosis present

## 2017-05-31 DIAGNOSIS — E872 Acidosis: Secondary | ICD-10-CM | POA: Diagnosis not present

## 2017-05-31 DIAGNOSIS — N170 Acute kidney failure with tubular necrosis: Secondary | ICD-10-CM | POA: Diagnosis present

## 2017-05-31 DIAGNOSIS — Z515 Encounter for palliative care: Secondary | ICD-10-CM | POA: Diagnosis not present

## 2017-05-31 DIAGNOSIS — R609 Edema, unspecified: Secondary | ICD-10-CM | POA: Diagnosis not present

## 2017-05-31 DIAGNOSIS — Z7189 Other specified counseling: Secondary | ICD-10-CM | POA: Diagnosis not present

## 2017-05-31 DIAGNOSIS — E1122 Type 2 diabetes mellitus with diabetic chronic kidney disease: Secondary | ICD-10-CM | POA: Diagnosis present

## 2017-05-31 DIAGNOSIS — K7460 Unspecified cirrhosis of liver: Secondary | ICD-10-CM | POA: Diagnosis not present

## 2017-05-31 DIAGNOSIS — A419 Sepsis, unspecified organism: Secondary | ICD-10-CM | POA: Diagnosis not present

## 2017-05-31 DIAGNOSIS — R188 Other ascites: Secondary | ICD-10-CM | POA: Diagnosis present

## 2017-05-31 DIAGNOSIS — J440 Chronic obstructive pulmonary disease with acute lower respiratory infection: Secondary | ICD-10-CM | POA: Diagnosis not present

## 2017-05-31 DIAGNOSIS — R531 Weakness: Secondary | ICD-10-CM | POA: Diagnosis not present

## 2017-05-31 LAB — CBC WITH DIFFERENTIAL/PLATELET
BASOS ABS: 0 10*3/uL (ref 0–0.1)
BASOS PCT: 0 %
EOS ABS: 0.1 10*3/uL (ref 0–0.7)
Eosinophils Relative: 2 %
HCT: 25.1 % — ABNORMAL LOW (ref 40.0–52.0)
HEMOGLOBIN: 8.1 g/dL — AB (ref 13.0–18.0)
Lymphocytes Relative: 12 %
Lymphs Abs: 0.5 10*3/uL — ABNORMAL LOW (ref 1.0–3.6)
MCH: 31.7 pg (ref 26.0–34.0)
MCHC: 32.3 g/dL (ref 32.0–36.0)
MCV: 97.9 fL (ref 80.0–100.0)
MONO ABS: 0.4 10*3/uL (ref 0.2–1.0)
Monocytes Relative: 9 %
NEUTROS ABS: 3.4 10*3/uL (ref 1.4–6.5)
NEUTROS PCT: 77 %
Platelets: 121 10*3/uL — ABNORMAL LOW (ref 150–440)
RBC: 2.56 MIL/uL — ABNORMAL LOW (ref 4.40–5.90)
RDW: 16.5 % — AB (ref 11.5–14.5)
WBC: 4.4 10*3/uL (ref 3.8–10.6)

## 2017-05-31 LAB — URINALYSIS, COMPLETE (UACMP) WITH MICROSCOPIC
BILIRUBIN URINE: NEGATIVE
GLUCOSE, UA: NEGATIVE mg/dL
HGB URINE DIPSTICK: NEGATIVE
Ketones, ur: 5 mg/dL — AB
NITRITE: NEGATIVE
PH: 7 (ref 5.0–8.0)
Protein, ur: 30 mg/dL — AB
Specific Gravity, Urine: 1.009 (ref 1.005–1.030)
Squamous Epithelial / LPF: NONE SEEN (ref 0–5)

## 2017-05-31 LAB — RENAL FUNCTION PANEL
ALBUMIN: 2.5 g/dL — AB (ref 3.5–5.0)
ALBUMIN: 2.4 g/dL — AB (ref 3.5–5.0)
ANION GAP: 5 (ref 5–15)
ANION GAP: 4 — AB (ref 5–15)
ANION GAP: 3 — AB (ref 5–15)
ANION GAP: 4 — AB (ref 5–15)
Albumin: 2.4 g/dL — ABNORMAL LOW (ref 3.5–5.0)
Albumin: 2.4 g/dL — ABNORMAL LOW (ref 3.5–5.0)
Albumin: 2.4 g/dL — ABNORMAL LOW (ref 3.5–5.0)
Albumin: 2.5 g/dL — ABNORMAL LOW (ref 3.5–5.0)
Albumin: 2.4 g/dL — ABNORMAL LOW (ref 3.5–5.0)
Albumin: 2.4 g/dL — ABNORMAL LOW (ref 3.5–5.0)
Anion gap: 4 — ABNORMAL LOW (ref 5–15)
Anion gap: 5 (ref 5–15)
Anion gap: 4 — ABNORMAL LOW (ref 5–15)
Anion gap: 5 (ref 5–15)
BUN: 57 mg/dL — AB (ref 6–20)
BUN: 56 mg/dL — AB (ref 6–20)
BUN: 51 mg/dL — AB (ref 6–20)
BUN: 50 mg/dL — ABNORMAL HIGH (ref 6–20)
BUN: 49 mg/dL — AB (ref 6–20)
BUN: 43 mg/dL — ABNORMAL HIGH (ref 6–20)
BUN: 41 mg/dL — ABNORMAL HIGH (ref 6–20)
BUN: 42 mg/dL — ABNORMAL HIGH (ref 6–20)
CALCIUM: 7.6 mg/dL — AB (ref 8.9–10.3)
CALCIUM: 7.6 mg/dL — AB (ref 8.9–10.3)
CALCIUM: 7.6 mg/dL — AB (ref 8.9–10.3)
CALCIUM: 7.5 mg/dL — AB (ref 8.9–10.3)
CHLORIDE: 108 mmol/L (ref 101–111)
CHLORIDE: 107 mmol/L (ref 101–111)
CHLORIDE: 107 mmol/L (ref 101–111)
CHLORIDE: 107 mmol/L (ref 101–111)
CHLORIDE: 108 mmol/L (ref 101–111)
CHLORIDE: 105 mmol/L (ref 101–111)
CO2: 23 mmol/L (ref 22–32)
CO2: 22 mmol/L (ref 22–32)
CO2: 23 mmol/L (ref 22–32)
CO2: 23 mmol/L (ref 22–32)
CO2: 22 mmol/L (ref 22–32)
CO2: 23 mmol/L (ref 22–32)
CO2: 24 mmol/L (ref 22–32)
CO2: 24 mmol/L (ref 22–32)
CREATININE: 3.6 mg/dL — AB (ref 0.61–1.24)
CREATININE: 3.17 mg/dL — AB (ref 0.61–1.24)
CREATININE: 3.18 mg/dL — AB (ref 0.61–1.24)
Calcium: 7.5 mg/dL — ABNORMAL LOW (ref 8.9–10.3)
Calcium: 7.6 mg/dL — ABNORMAL LOW (ref 8.9–10.3)
Calcium: 7.6 mg/dL — ABNORMAL LOW (ref 8.9–10.3)
Calcium: 7.5 mg/dL — ABNORMAL LOW (ref 8.9–10.3)
Chloride: 107 mmol/L (ref 101–111)
Chloride: 106 mmol/L (ref 101–111)
Creatinine, Ser: 3.87 mg/dL — ABNORMAL HIGH (ref 0.61–1.24)
Creatinine, Ser: 3.28 mg/dL — ABNORMAL HIGH (ref 0.61–1.24)
Creatinine, Ser: 2.81 mg/dL — ABNORMAL HIGH (ref 0.61–1.24)
Creatinine, Ser: 2.8 mg/dL — ABNORMAL HIGH (ref 0.61–1.24)
Creatinine, Ser: 2.73 mg/dL — ABNORMAL HIGH (ref 0.61–1.24)
GFR calc Af Amer: 15 mL/min — ABNORMAL LOW (ref >60)
GFR calc Af Amer: 17 mL/min — ABNORMAL LOW (ref >60)
GFR calc Af Amer: 19 mL/min — ABNORMAL LOW (ref >60)
GFR calc Af Amer: 20 mL/min — ABNORMAL LOW (ref >60)
GFR calc Af Amer: 23 mL/min — ABNORMAL LOW (ref >60)
GFR calc non Af Amer: 13 mL/min — ABNORMAL LOW (ref >60)
GFR calc non Af Amer: 15 mL/min — ABNORMAL LOW (ref >60)
GFR calc non Af Amer: 16 mL/min — ABNORMAL LOW (ref >60)
GFR calc non Af Amer: 17 mL/min — ABNORMAL LOW (ref >60)
GFR calc non Af Amer: 20 mL/min — ABNORMAL LOW (ref >60)
GFR calc non Af Amer: 20 mL/min — ABNORMAL LOW (ref >60)
GFR calc non Af Amer: 20 mL/min — ABNORMAL LOW (ref >60)
GFR, EST AFRICAN AMERICAN: 20 mL/min — AB (ref >60)
GFR, EST AFRICAN AMERICAN: 23 mL/min — AB (ref >60)
GFR, EST AFRICAN AMERICAN: 24 mL/min — AB (ref >60)
GFR, EST NON AFRICAN AMERICAN: 17 mL/min — AB (ref >60)
GLUCOSE: 98 mg/dL (ref 65–99)
GLUCOSE: 136 mg/dL — AB (ref 65–99)
GLUCOSE: 115 mg/dL — AB (ref 65–99)
GLUCOSE: 118 mg/dL — AB (ref 65–99)
GLUCOSE: 120 mg/dL — AB (ref 65–99)
GLUCOSE: 115 mg/dL — AB (ref 65–99)
Glucose, Bld: 116 mg/dL — ABNORMAL HIGH (ref 65–99)
Glucose, Bld: 108 mg/dL — ABNORMAL HIGH (ref 65–99)
PHOSPHORUS: 4.3 mg/dL (ref 2.5–4.6)
PHOSPHORUS: 4 mg/dL (ref 2.5–4.6)
POTASSIUM: 6.5 mmol/L — AB (ref 3.5–5.1)
POTASSIUM: 6.4 mmol/L — AB (ref 3.5–5.1)
POTASSIUM: 5.8 mmol/L — AB (ref 3.5–5.1)
POTASSIUM: 5.8 mmol/L — AB (ref 3.5–5.1)
Phosphorus: 5.1 mg/dL — ABNORMAL HIGH (ref 2.5–4.6)
Phosphorus: 4.7 mg/dL — ABNORMAL HIGH (ref 2.5–4.6)
Phosphorus: 4.6 mg/dL (ref 2.5–4.6)
Phosphorus: 4.4 mg/dL (ref 2.5–4.6)
Phosphorus: 4.1 mg/dL (ref 2.5–4.6)
Phosphorus: 4 mg/dL (ref 2.5–4.6)
Potassium: 6.5 mmol/L (ref 3.5–5.1)
Potassium: 6.3 mmol/L (ref 3.5–5.1)
Potassium: 6.3 mmol/L (ref 3.5–5.1)
Potassium: 5.6 mmol/L — ABNORMAL HIGH (ref 3.5–5.1)
SODIUM: 134 mmol/L — AB (ref 135–145)
SODIUM: 134 mmol/L — AB (ref 135–145)
SODIUM: 133 mmol/L — AB (ref 135–145)
Sodium: 135 mmol/L (ref 135–145)
Sodium: 134 mmol/L — ABNORMAL LOW (ref 135–145)
Sodium: 134 mmol/L — ABNORMAL LOW (ref 135–145)
Sodium: 134 mmol/L — ABNORMAL LOW (ref 135–145)
Sodium: 135 mmol/L (ref 135–145)

## 2017-05-31 LAB — CBC
HEMATOCRIT: 27.1 % — AB (ref 40.0–52.0)
Hemoglobin: 8.9 g/dL — ABNORMAL LOW (ref 13.0–18.0)
MCH: 31.7 pg (ref 26.0–34.0)
MCHC: 33 g/dL (ref 32.0–36.0)
MCV: 96.1 fL (ref 80.0–100.0)
Platelets: 125 10*3/uL — ABNORMAL LOW (ref 150–440)
RBC: 2.82 MIL/uL — AB (ref 4.40–5.90)
RDW: 17.3 % — ABNORMAL HIGH (ref 11.5–14.5)
WBC: 4.8 10*3/uL (ref 3.8–10.6)

## 2017-05-31 LAB — PREPARE RBC (CROSSMATCH)

## 2017-05-31 LAB — GLUCOSE, CAPILLARY
Glucose-Capillary: 94 mg/dL (ref 65–99)
Glucose-Capillary: 104 mg/dL — ABNORMAL HIGH (ref 65–99)
Glucose-Capillary: 114 mg/dL — ABNORMAL HIGH (ref 65–99)
Glucose-Capillary: 112 mg/dL — ABNORMAL HIGH (ref 65–99)

## 2017-05-31 LAB — COMPREHENSIVE METABOLIC PANEL
ALBUMIN: 2.5 g/dL — AB (ref 3.5–5.0)
ALT: 16 U/L — ABNORMAL LOW (ref 17–63)
ANION GAP: 7 (ref 5–15)
AST: 45 U/L — AB (ref 15–41)
Alkaline Phosphatase: 110 U/L (ref 38–126)
BUN: 56 mg/dL — AB (ref 6–20)
CO2: 21 mmol/L — AB (ref 22–32)
Calcium: 7.8 mg/dL — ABNORMAL LOW (ref 8.9–10.3)
Chloride: 104 mmol/L (ref 101–111)
Creatinine, Ser: 4.23 mg/dL — ABNORMAL HIGH (ref 0.61–1.24)
GFR calc Af Amer: 14 mL/min — ABNORMAL LOW (ref >60)
GFR calc non Af Amer: 12 mL/min — ABNORMAL LOW (ref >60)
GLUCOSE: 116 mg/dL — AB (ref 65–99)
POTASSIUM: 7.2 mmol/L — AB (ref 3.5–5.1)
SODIUM: 132 mmol/L — AB (ref 135–145)
TOTAL PROTEIN: 5.8 g/dL — AB (ref 6.5–8.1)
Total Bilirubin: 1 mg/dL (ref 0.3–1.2)

## 2017-05-31 LAB — PROTIME-INR
INR: 1.44
PROTHROMBIN TIME: 17.4 s — AB (ref 11.4–15.2)

## 2017-05-31 LAB — MAGNESIUM: MAGNESIUM: 2.7 mg/dL — AB (ref 1.7–2.4)

## 2017-05-31 LAB — PROTEIN / CREATININE RATIO, URINE
Creatinine, Urine: 177 mg/dL
Protein Creatinine Ratio: 0.15 mg/mg{Cre} (ref 0.00–0.15)
TOTAL PROTEIN, URINE: 27 mg/dL

## 2017-05-31 LAB — ABO/RH: ABO/RH(D): O POS

## 2017-05-31 LAB — MRSA PCR SCREENING: MRSA BY PCR: NEGATIVE

## 2017-05-31 LAB — TSH: TSH: 4.188 u[IU]/mL (ref 0.350–4.500)

## 2017-05-31 LAB — TROPONIN I: Troponin I: <0.03 ng/mL (ref <0.03)

## 2017-05-31 LAB — BRAIN NATRIURETIC PEPTIDE: B Natriuretic Peptide: 487 pg/mL — ABNORMAL HIGH (ref 0.0–100.0)

## 2017-05-31 LAB — CK: Total CK: 99 U/L (ref 49–397)

## 2017-05-31 LAB — LACTIC ACID, PLASMA: Lactic Acid, Venous: 1.4 mmol/L (ref 0.5–1.9)

## 2017-05-31 MED ORDER — SODIUM BICARBONATE 8.4 % IV SOLN
50.0000 meq | Freq: Once | INTRAVENOUS | Status: AC
  Administered 2017-05-31: 50 meq via INTRAVENOUS
  Filled 2017-05-31: qty 50

## 2017-05-31 MED ORDER — PUREFLOW DIALYSIS SOLUTION
INTRAVENOUS | Status: DC
  Administered 2017-05-31: 18:00:00 via INTRAVENOUS_CENTRAL

## 2017-05-31 MED ORDER — CLONIDINE HCL 0.1 MG PO TABS
0.2000 mg | ORAL_TABLET | Freq: Two times a day (BID) | ORAL | Status: DC
  Filled 2017-05-31: qty 2

## 2017-05-31 MED ORDER — PANTOPRAZOLE SODIUM 40 MG IV SOLR
40.0000 mg | Freq: Once | INTRAVENOUS | Status: AC
  Administered 2017-05-31: 40 mg via INTRAVENOUS
  Filled 2017-05-31: qty 40

## 2017-05-31 MED ORDER — NYSTATIN 100000 UNIT/GM EX POWD
Freq: Three times a day (TID) | CUTANEOUS | Status: DC
  Administered 2017-05-31 – 2017-06-16 (×35): via TOPICAL
  Filled 2017-05-31: qty 15

## 2017-05-31 MED ORDER — SODIUM CHLORIDE 0.9 % IV BOLUS
250.0000 mL | Freq: Once | INTRAVENOUS | Status: AC
  Administered 2017-05-31: 250 mL via INTRAVENOUS

## 2017-05-31 MED ORDER — DEXTROSE 50 % IV SOLN
1.0000 | Freq: Once | INTRAVENOUS | Status: AC
  Administered 2017-05-31: 50 mL via INTRAVENOUS
  Filled 2017-05-31: qty 50

## 2017-05-31 MED ORDER — DOCUSATE SODIUM 100 MG PO CAPS
100.0000 mg | ORAL_CAPSULE | Freq: Two times a day (BID) | ORAL | Status: DC
  Administered 2017-05-31 – 2017-06-16 (×25): 100 mg via ORAL
  Filled 2017-05-31 (×25): qty 1

## 2017-05-31 MED ORDER — DOXAZOSIN MESYLATE 4 MG PO TABS
4.0000 mg | ORAL_TABLET | Freq: Two times a day (BID) | ORAL | Status: DC
  Filled 2017-05-31 (×6): qty 1

## 2017-05-31 MED ORDER — MAGNESIUM OXIDE 400 (241.3 MG) MG PO TABS
400.0000 mg | ORAL_TABLET | Freq: Every day | ORAL | Status: DC
  Administered 2017-05-31 – 2017-06-16 (×13): 400 mg via ORAL
  Filled 2017-05-31 (×12): qty 1

## 2017-05-31 MED ORDER — HEPARIN SODIUM (PORCINE) 1000 UNIT/ML DIALYSIS
1000.0000 [IU] | INTRAMUSCULAR | Status: DC | PRN
  Administered 2017-06-01 (×2): 1400 [IU] via INTRAVENOUS_CENTRAL
  Filled 2017-05-31 (×2): qty 6
  Filled 2017-05-31: qty 4
  Filled 2017-05-31 (×3): qty 6

## 2017-05-31 MED ORDER — OCTREOTIDE LOAD VIA INFUSION
50.0000 ug | Freq: Once | INTRAVENOUS | Status: AC
  Administered 2017-05-31: 50 ug via INTRAVENOUS
  Filled 2017-05-31: qty 25

## 2017-05-31 MED ORDER — ACETAMINOPHEN 650 MG RE SUPP: 650.0000 mg | Freq: Four times a day (QID) | RECTAL | Status: DC | PRN

## 2017-05-31 MED ORDER — ACETAMINOPHEN 325 MG PO TABS
650.0000 mg | ORAL_TABLET | ORAL | Status: DC | PRN
  Administered 2017-05-31 – 2017-06-15 (×2): 650 mg via ORAL
  Filled 2017-05-31 (×2): qty 2

## 2017-05-31 MED ORDER — SODIUM CHLORIDE 0.9 % IV SOLN: Freq: Once | INTRAVENOUS | Status: DC

## 2017-05-31 MED ORDER — TIOTROPIUM BROMIDE MONOHYDRATE 18 MCG IN CAPS
18.0000 ug | ORAL_CAPSULE | Freq: Every day | RESPIRATORY_TRACT | Status: DC
  Administered 2017-05-31 – 2017-06-02 (×3): 18 ug via RESPIRATORY_TRACT
  Filled 2017-05-31: qty 5

## 2017-05-31 MED ORDER — SODIUM POLYSTYRENE SULFONATE 15 GM/60ML PO SUSP
30.0000 g | Freq: Once | ORAL | Status: AC
  Administered 2017-05-31: 30 g via ORAL
  Filled 2017-05-31: qty 120

## 2017-05-31 MED ORDER — NADOLOL 40 MG PO TABS: 40.0000 mg | ORAL_TABLET | Freq: Every day | ORAL | Status: DC

## 2017-05-31 MED ORDER — ALBUTEROL SULFATE (2.5 MG/3ML) 0.083% IN NEBU
2.5000 mg | INHALATION_SOLUTION | RESPIRATORY_TRACT | Status: DC | PRN
  Administered 2017-06-07 – 2017-06-13 (×2): 2.5 mg via RESPIRATORY_TRACT
  Filled 2017-05-31 (×2): qty 3

## 2017-05-31 MED ORDER — PHENYLEPHRINE HCL 10 MG/ML IJ SOLN
0.0000 ug/min | INTRAMUSCULAR | Status: DC
  Administered 2017-05-31: 100 ug/min via INTRAVENOUS
  Administered 2017-05-31: 40 ug/min via INTRAVENOUS
  Filled 2017-05-31: qty 1
  Filled 2017-05-31: qty 250
  Filled 2017-05-31: qty 1

## 2017-05-31 MED ORDER — CYANOCOBALAMIN 500 MCG PO TABS
250.0000 ug | ORAL_TABLET | Freq: Every day | ORAL | Status: DC
  Administered 2017-05-31 – 2017-06-16 (×13): 250 ug via ORAL
  Filled 2017-05-31: qty 1
  Filled 2017-05-31: qty 3
  Filled 2017-05-31 (×7): qty 1
  Filled 2017-05-31: qty 3
  Filled 2017-05-31 (×2): qty 1

## 2017-05-31 MED ORDER — ALBUTEROL SULFATE (2.5 MG/3ML) 0.083% IN NEBU
2.5000 mg | INHALATION_SOLUTION | RESPIRATORY_TRACT | Status: AC
Start: 2017-05-31 — End: 2017-05-31
  Administered 2017-05-31: 2.5 mg via RESPIRATORY_TRACT
  Filled 2017-05-31: qty 3

## 2017-05-31 MED ORDER — ONDANSETRON HCL 4 MG/2ML IJ SOLN: 4.0000 mg | Freq: Four times a day (QID) | INTRAMUSCULAR | Status: DC | PRN

## 2017-05-31 MED ORDER — INSULIN ASPART 100 UNIT/ML ~~LOC~~ SOLN
10.0000 [IU] | Freq: Once | SUBCUTANEOUS | Status: AC
  Administered 2017-05-31: 10 [IU] via INTRAVENOUS
  Filled 2017-05-31: qty 1

## 2017-05-31 MED ORDER — ACETAMINOPHEN 325 MG PO TABS
650.0000 mg | ORAL_TABLET | Freq: Four times a day (QID) | ORAL | Status: DC | PRN
  Administered 2017-05-31 (×2): 650 mg via ORAL
  Filled 2017-05-31 (×2): qty 2

## 2017-05-31 MED ORDER — LOSARTAN POTASSIUM 50 MG PO TABS: 100.0000 mg | ORAL_TABLET | Freq: Every day | ORAL | Status: DC

## 2017-05-31 MED ORDER — SODIUM CHLORIDE 0.9 % IV SOLN
1.0000 g | Freq: Once | INTRAVENOUS | Status: AC
  Administered 2017-05-31: 1 g via INTRAVENOUS
  Filled 2017-05-31: qty 10

## 2017-05-31 MED ORDER — SODIUM CHLORIDE 0.9 % IV SOLN
1.0000 g | Freq: Every day | INTRAVENOUS | Status: DC
  Administered 2017-05-31 – 2017-06-01 (×2): 1 g via INTRAVENOUS
  Filled 2017-05-31 (×3): qty 10

## 2017-05-31 MED ORDER — HEPARIN SODIUM (PORCINE) 5000 UNIT/ML IJ SOLN: 5000.0000 [IU] | Freq: Three times a day (TID) | INTRAMUSCULAR | Status: DC

## 2017-05-31 MED ORDER — SIMVASTATIN 20 MG PO TABS
20.0000 mg | ORAL_TABLET | Freq: Every day | ORAL | Status: DC
  Administered 2017-05-31 – 2017-06-15 (×13): 20 mg via ORAL
  Filled 2017-05-31 (×12): qty 1

## 2017-05-31 MED ORDER — SODIUM CHLORIDE 0.9 % IV BOLUS
1000.0000 mL | Freq: Once | INTRAVENOUS | Status: AC
  Administered 2017-05-31: 1000 mL via INTRAVENOUS

## 2017-05-31 MED ORDER — LIDOCAINE HCL (PF) 1 % IJ SOLN
30.0000 mL | INTRAMUSCULAR | Status: AC
  Filled 2017-05-31 (×2): qty 30

## 2017-05-31 MED ORDER — UMECLIDINIUM-VILANTEROL 62.5-25 MCG/INH IN AEPB
1.0000 | INHALATION_SPRAY | Freq: Every day | RESPIRATORY_TRACT | Status: DC
  Administered 2017-06-02 – 2017-06-16 (×13): 1 via RESPIRATORY_TRACT
  Filled 2017-05-31 (×4): qty 14

## 2017-05-31 MED ORDER — PANTOPRAZOLE SODIUM 40 MG IV SOLR
8.0000 mg/h | INTRAVENOUS | Status: DC
  Administered 2017-05-31 – 2017-06-02 (×6): 8 mg/h via INTRAVENOUS
  Filled 2017-05-31 (×7): qty 80

## 2017-05-31 MED ORDER — INSULIN ASPART 100 UNIT/ML ~~LOC~~ SOLN: 0.0000 [IU] | Freq: Every day | SUBCUTANEOUS | Status: DC

## 2017-05-31 MED ORDER — INSULIN ASPART 100 UNIT/ML ~~LOC~~ SOLN
0.0000 [IU] | Freq: Three times a day (TID) | SUBCUTANEOUS | Status: DC
  Administered 2017-06-10 – 2017-06-14 (×4): 1 [IU] via SUBCUTANEOUS
  Filled 2017-05-31 (×4): qty 1

## 2017-05-31 MED ORDER — SODIUM CHLORIDE 0.9 % IV SOLN
50.0000 ug/h | INTRAVENOUS | Status: DC
  Administered 2017-05-31 – 2017-06-01 (×2): 50 ug/h via INTRAVENOUS
  Filled 2017-05-31 (×10): qty 1

## 2017-05-31 MED ORDER — SODIUM CHLORIDE 0.9 % IV SOLN
INTRAVENOUS | Status: DC
  Administered 2017-05-31 – 2017-06-03 (×6): via INTRAVENOUS

## 2017-05-31 MED ORDER — SODIUM CHLORIDE 0.9 % IV SOLN
0.0000 ug/min | INTRAVENOUS | Status: DC
  Administered 2017-05-31: 50 ug/min via INTRAVENOUS
  Filled 2017-05-31: qty 2

## 2017-05-31 MED ORDER — ACETAMINOPHEN 650 MG RE SUPP: 650.0000 mg | RECTAL | Status: DC | PRN

## 2017-05-31 MED ORDER — ONDANSETRON HCL 4 MG PO TABS: 4.0000 mg | ORAL_TABLET | Freq: Four times a day (QID) | ORAL | Status: DC | PRN

## 2017-05-31 MED ORDER — FERROUS SULFATE 325 (65 FE) MG PO TABS
325.0000 mg | ORAL_TABLET | Freq: Every day | ORAL | Status: DC
  Administered 2017-05-31 – 2017-06-16 (×12): 325 mg via ORAL
  Filled 2017-05-31 (×12): qty 1

## 2017-05-31 NOTE — Consult Note (Signed)
[facility] CONSULT NOTE    Date: 05/31/2017                  Patient Name:  [name]  MRN: [id number]  DOB: October 13, 1933  Age / Sex: 84 y.o., male         PCP: Ria Bush, MD                 Service Requesting Consult: Hospitalist                 Reason for Consult: Acute renal failure, hyperkalemia.             History of Present Illness: Patient is a 84 y.o. male with a PMHx of osteoarthritis, atrial fibrillation, congestive heart failure, chronic atrial for ablation, COPD, cryptogenic cirrhosis, diabetes mellitus type 2, gastric varices, history of bladder cancer status post ileal conduit, hypertension, hyperlipidemia,  who was admitted to [facility] on 05/30/2017 for evaluation of generalized weakness.  He states that over the past several weeks his strength has been gradually diminishing to the point that he can no longer uses walker.  Upon being brought here he was found to have multiple metabolic derangements including a serum potassium of 7.2.  We have requested urgent renal function panel repeat to determine what his potassium is now.  His creatinine is also well above his normal baseline.  Patient was noted to be on diuretics at home in the form of Lasix.  He also reports that he had rather diminished p.o. intake recently.   Medications: Outpatient medications: Medications Prior to Admission  Medication Sig Dispense Refill Last Dose  . acetaminophen (TYLENOL) 325 MG tablet Take 325 mg by mouth every 6 (six) hours as needed for mild pain or fever.    prn at prn  . albuterol (PROVENTIL HFA;VENTOLIN HFA) 108 (90 Base) MCG/ACT inhaler Inhale 2 puffs into the lungs every 6 (six) hours as needed for wheezing.   prn at prn  . cloNIDine (CATAPRES) 0.2 MG tablet TAKE 1 TABLET BY MOUTH TWICE A DAY 180 tablet 3 05/30/2017 at Unknown time  . doxazosin (CARDURA) 8 MG tablet TAKE 1/2 TABLET BY MOUTH TWICE A DAY 90 tablet 0 05/30/2017 at Unknown time  . ferrous  sulfate (FERRO-BOB) 325 (65 FE) MG tablet Take 1 tablet (325 mg total) by mouth daily with breakfast.   05/30/2017 at Unknown time  . glucose blood (ONE TOUCH ULTRA TEST) test strip Use to test sugar once daily and as needed Dx: E11.8 100 each 1 unknown at unknown  . losartan (COZAAR) 100 MG tablet TAKE 1 TABLET BY MOUTH ONCE DAILY 90 tablet 3 05/30/2017 at Unknown time  . magnesium oxide (MAG-OX) 400 MG tablet Take 400 mg by mouth daily.   05/30/2017 at Unknown time  . nadolol (CORGARD) 40 MG tablet TAKE 1 TABLET BY MOUTH ONCE DAILY 90 tablet 0 05/30/2017 at Unknown time  . ONE TOUCH LANCETS MISC One daily and as needed/ ICD code 250.00    unknown at unknown  . pantoprazole (PROTONIX) 40 MG tablet TAKE 1 TABLET BY MOUTH ONCE DAILY 90 tablet 3 05/30/2017 at Unknown time  . simvastatin (ZOCOR) 20 MG tablet TAKE 1 TABLET BY MOUTH EVERY DAY (Patient taking differently: TAKE 1 TABLET BY MOUTH EVERY NIGHT) 90 tablet 0 05/30/2017 at Unknown time  . tiotropium (SPIRIVA) 18 MCG inhalation capsule Place 18 mcg into inhaler and inhale daily.   05/30/2017 at Unknown time  .  vitamin B-12 (CYANOCOBALAMIN) 250 MCG tablet Take 250 mcg by mouth daily.   05/30/2017 at Unknown time  . ALPRAZolam (XANAX) 0.5 MG tablet Take 0.5 mg by mouth at bedtime as needed for anxiety (For Laser procedure).   Not Taking at Unknown time  . ANORO ELLIPTA 62.5-25 MCG/INH AEPB   0 Not Taking at Unknown time  . furosemide (LASIX) 20 MG tablet TAKE 2 TABLETS BY MOUTH EVERY MORNING AND 1 TABLET IN THE AFTERNOON AS DIRECTED (Patient not taking: Reported on 05/31/2017) 90 tablet 0 Not Taking at Unknown time  . triamcinolone cream (KENALOG) 0.1 % Apply 1 application topically 2 (two) times daily. Apply to AA. (Patient not taking: Reported on 05/31/2017) 30 g 0 Not Taking at Unknown time    Current medications: Current Facility-Administered Medications  Medication Dose Route Frequency Provider Last Rate Last Dose  . 0.9 %  sodium chloride infusion    Intravenous Once Harrie Foreman, MD      . acetaminophen (TYLENOL) tablet 650 mg  650 mg Oral Q6H PRN Harrie Foreman, MD   650 mg at 05/31/17 8527   Or  . acetaminophen (TYLENOL) suppository 650 mg  650 mg Rectal Q6H PRN Harrie Foreman, MD      . albuterol (PROVENTIL) (2.5 MG/3ML) 0.083% nebulizer solution 2.5 mg  2.5 mg Nebulization Q4H PRN Harrie Foreman, MD      . cloNIDine (CATAPRES) tablet 0.2 mg  0.2 mg Oral BID Harrie Foreman, MD   Stopped at 05/31/17 0345  . docusate sodium (COLACE) capsule 100 mg  100 mg Oral BID Harrie Foreman, MD   100 mg at 05/31/17 0357  . doxazosin (CARDURA) tablet 4 mg  4 mg Oral BID Harrie Foreman, MD      . ferrous sulfate tablet 325 mg  325 mg Oral Q breakfast Harrie Foreman, MD      . insulin aspart (novoLOG) injection 0-5 Units  0-5 Units Subcutaneous QHS Harrie Foreman, MD      . insulin aspart (novoLOG) injection 0-9 Units  0-9 Units Subcutaneous TID WC Harrie Foreman, MD      . magnesium oxide (MAG-OX) tablet 400 mg  400 mg Oral Daily Harrie Foreman, MD      . nystatin (MYCOSTATIN/NYSTOP) topical powder   Topical TID Conforti, John, DO      . ondansetron (ZOFRAN) tablet 4 mg  4 mg Oral Q6H PRN Harrie Foreman, MD       Or  . ondansetron [facility]) injection 4 mg  4 mg Intravenous Q6H PRN Harrie Foreman, MD      . pantoprazole (PROTONIX) 80 mg in sodium chloride 0.9 % 250 mL (0.32 mg/mL) infusion  8 mg/hr Intravenous Continuous Harrie Foreman, MD 25 mL/hr at 05/31/17 0141 8 mg/hr at 05/31/17 0141  . simvastatin (ZOCOR) tablet 20 mg  20 mg Oral QHS Harrie Foreman, MD      . tiotropium [facility]) inhalation capsule 18 mcg  18 mcg Inhalation Daily Harrie Foreman, MD      . umeclidinium-vilanterol (ANORO ELLIPTA) 62.5-25 MCG/INH 1 puff  1 puff Inhalation Daily Harrie Foreman, MD      . vitamin B-12 (CYANOCOBALAMIN) tablet 250 mcg  250 mcg Oral Daily Harrie Foreman, MD          Allergies: No  Known Allergies    Past Medical History: Past Medical History:  Diagnosis Date  . Arthritis   .  Atrial fibrillation ([place]) 06/23/2010  . B12 deficiency   . C. difficile colitis 07/06/2011  . CHF (congestive heart failure) (HCC)    diastolic. Echo 5/10 with mild LVH, EF 84%, grade 1 diastolic dysfunction, severe left atrial enlargement, aortic sclerosis  . Chronic a-fib (HCC)    on pradaxa, declined cardioversion in past  . COPD (chronic obstructive pulmonary disease) ([place])    (Dr. Raul Del)  . Cryptogenic cirrhosis ([place])    Followed by Dr Fuller Plan, never biopsied, has been stable.  History of gastric varices.  . Diabetes mellitus, type 2 ([place])   . Gastric and duodenal angiodysplasia 06/28/2011  . Gastric varices   . GI bleed   . Hemorrhoids   . History of bladder cancer 1984   s/p ileal conduit  . History of ileostomy   . History of pneumonia 2012  . Hyperlipidemia   . Hypertension   . Inguinal hernia    left  . Iron deficiency anemia   . Kidney atrophy 02/2016  . Pinched nerve    back  . Pyloric stenosis    mild EGD 10/26/06  . RLL pneumonia ([place]) 01/28/2014  . Shortness of breath      Past Surgical History: Past Surgical History:  Procedure Laterality Date  . BASAL CELL CARCINOMA EXCISION  2019   multiple sites  . BE  08/08/2003  . BLADDER REMOVAL     s/p urostomy bag  . COLONOSCOPY  07/24/2003   Int. hemorrhoids  . COLONOSCOPY  10/26/2006   Int hemorrhoids, 10 yrs  . ESOPHAGOGASTRODUODENOSCOPY  10/26/2006   Gastric varices, pyloric stenosis  . ESOPHAGOGASTRODUODENOSCOPY  07/01/2011   Procedure: ESOPHAGOGASTRODUODENOSCOPY (EGD);  Surgeon: Jerene Bears, MD;  Location: [place];  Service: Gastroenterology;  Laterality: N/A;  . ESOPHAGOGASTRODUODENOSCOPY  06/28/2011   Procedure: ESOPHAGOGASTRODUODENOSCOPY (EGD);  Surgeon: Jerene Bears, MD;  Location: [place];  Service: Gastroenterology;  Laterality: N/A;  . FETAL BLOOD TRANSFUSION    . ILEOSTOMY  1984   bladder  cancer  . PENILE PROSTHESIS IMPLANT  1990   inflatable implant  . PROSTATECTOMY  1984   bladder cancer  . US ECHOCARDIOGRAPHY  05/1995   EF 60% TR, AI     Family History: Family History  Problem Relation Age of Onset  . Hypertension Mother   . Osteoarthritis Mother   . Heart disease Father        MI  . Coronary artery disease Sister   . Diabetes Brother   . Heart disease Brother        MI  . Coronary artery disease Sister   . Stroke Other        GPs  . Colon cancer Neg Hx      Social History: Social History   Socioeconomic History  . Marital status: Married    Spouse name: Not on file  . Number of children: 7  . Years of education: Not on file  . Highest education level: Not on file  Occupational History  . Occupation: trucking, retired  Scientific laboratory technician  . Financial resource strain: Not on file  . Food insecurity:    Worry: Not on file    Inability: Not on file  . Transportation needs:    Medical: Not on file    Non-medical: Not on file  Tobacco Use  . Smoking status: Former Smoker    Packs/day: 1.00    Years: 45.00    Pack years: 45.00    Types: Cigarettes    Last  attempt to quit: 06/22/1989    Years since quitting: 27.9  . Smokeless tobacco: Never Used  . Tobacco comment: Quit 1990  Substance and Sexual Activity  . Alcohol use: No    Alcohol/week: 0.6 oz    Types: 1 Standard drinks or equivalent per week  . Drug use: No  . Sexual activity: Not Currently  Lifestyle  . Physical activity:    Days per week: Not on file    Minutes per session: Not on file  . Stress: Not on file  Relationships  . Social connections:    Talks on phone: Not on file    Gets together: Not on file    Attends religious service: Not on file    Active member of club or organization: Not on file    Attends meetings of clubs or organizations: Not on file    Relationship status: Not on file  . Intimate partner violence:    Fear of current or ex partner: Not on file     Emotionally abused: Not on file    Physically abused: Not on file    Forced sexual activity: Not on file  Other Topics Concern  . Not on file  Social History Narrative   Daily caffeine use - 1-2 cups/day coffee   Lives with wife.     Served 2 yrs in [employer]   7 children   Occ: retired, was Administrator.   Activity: no regular exercise   Diet: good water, fruits/vegetables daily     Review of Systems: Review of Systems  Constitutional: Positive for malaise/fatigue. Negative for chills and fever.  HENT: Positive for hearing loss. Negative for nosebleeds and tinnitus.   Eyes: Negative for blurred vision and double vision.  Respiratory: Negative for cough, hemoptysis and sputum production.   Cardiovascular: Negative for chest pain, palpitations and orthopnea.  Gastrointestinal: Negative for heartburn, nausea and vomiting.  Genitourinary: Negative for dysuria and urgency.  Musculoskeletal: Negative for back pain and myalgias.  Skin: Negative for rash.  Neurological: Positive for weakness. Negative for dizziness and focal weakness.  Endo/Heme/Allergies: Negative for polydipsia. Does not bruise/bleed easily.  Psychiatric/Behavioral: Negative for depression. The patient is not nervous/anxious.      Vital Signs: Blood pressure (!) 107/41, pulse (!) 55, temperature 97.6 F (36.4 C), temperature source Oral, resp. rate (!) 22, height 5\' 8"  (1.727 m), weight 105.5 kg (232 lb 9.4 oz), SpO2 96 %.  Weight trends: Filed Weights   05/31/17 0052 05/31/17 0949  Weight: 97.5 kg (215 lb) 105.5 kg (232 lb 9.4 oz)    Physical Exam: General: NAD, resting in bed  Head: Normocephalic, atraumatic.  Eyes: Anicteric, EOMI  Nose: Mucous membranes moist, not inflammed, nonerythematous.  Throat: Oropharynx nonerythematous, no exudate appreciated.   Neck: Supple, trachea midline.  Lungs:  Scattered rhonchi  Heart: S1S2 no rubs  Abdomen:  BS normoactive. Soft, Nondistended, non-tender.  No  masses or organomegaly.  Extremities: trace pretibial edema.  Neurologic: Awake, alert, follows simple commands  Skin: No visible rashes, scars.    Lab results: Basic Metabolic Panel: Recent Labs  Lab 05/31/17 0008  NA 132*  K 7.2*  CL 104  CO2 21*  GLUCOSE 116*  BUN 56*  CREATININE 4.23*  CALCIUM 7.8*    Liver Function Tests: Recent Labs  Lab 05/31/17 0008  AST 45*  ALT 16*  ALKPHOS 110  BILITOT 1.0  PROT 5.8*  ALBUMIN 2.5*   No results for input(s): LIPASE, AMYLASE in the  last 168 hours. No results for input(s): AMMONIA in the last 168 hours.  CBC: Recent Labs  Lab 05/31/17 0008  WBC 4.4  NEUTROABS 3.4  HGB 8.1*  HCT 25.1*  MCV 97.9  PLT 121*    Cardiac Enzymes: Recent Labs  Lab 05/31/17 0008  TROPONINI <0.03    BNP: Invalid input(s): POCBNP  CBG: Recent Labs  Lab 05/31/17 0938  GLUCAP 74    Microbiology: Results for orders placed or performed during the hospital encounter of 05/30/17  Culture, blood (routine x 2)     Status: None (Preliminary result)   Collection Time: 05/31/17 12:05 AM  Result Value Ref Range Status   Specimen Description BLOOD RIGHT HAND  Final   Special Requests   Final    BOTTLES DRAWN AEROBIC AND ANAEROBIC Blood Culture adequate volume   Culture   Final    NO GROWTH < 12 HOURS Performed at [facility], [address] [place], [place] [address]    Report Status PENDING  Incomplete  Culture, blood (routine x 2)     Status: None (Preliminary result)   Collection Time: 05/31/17 12:08 AM  Result Value Ref Range Status   Specimen Description BLOOD LEFT AC  Final   Special Requests   Final    BOTTLES DRAWN AEROBIC AND ANAEROBIC Blood Culture adequate volume   Culture   Final    NO GROWTH < 12 HOURS Performed at [facility], [address] [place], [place] [address]    Report Status PENDING  Incomplete    Coagulation Studies: Recent Labs    05/31/17 0011  LABPROT 17.4*  INR 1.44     Urinalysis: Recent Labs    05/31/17 0005  COLORURINE AMBER*  LABSPEC 1.009  PHURINE 7.0  GLUCOSEU NEGATIVE  HGBUR NEGATIVE  BILIRUBINUR NEGATIVE  KETONESUR 5*  PROTEINUR 30*  NITRITE NEGATIVE  LEUKOCYTESUR MODERATE*      Imaging: Ct Head Wo Contrast  Result Date: 05/31/2017 CLINICAL DATA:  Altered level of consciousness, patient fell out of bed and is hypotensive with bradycardia. EXAM: CT HEAD WITHOUT CONTRAST TECHNIQUE: Contiguous axial images were obtained from the base of the skull through the vertex without intravenous contrast. COMPARISON:  None. FINDINGS: Brain: Remote appearing right thalamic lacunar infarct. Age related involutional changes of the brain with mild-to-moderate small vessel ischemic disease. No large vascular territory infarction, hemorrhage or midline shift. No intra-axial mass nor extra-axial fluid collections. Vascular: No hyperdense vessel sign. Skull: Intact Sinuses/Orbits: Complete opacification of the included maxillary sinuses with desiccated mucus seen within. Moderate ethmoid sinus mucosal thickening more so anteriorly. The sphenoid sinus is clear. There is right-sided frontal sinus mucosal opacification. Intact orbits and globes. Other: None IMPRESSION: Paranasal sinus mucosal thickening in opacification as above. Remote appearing small vessel ischemic disease and right thalamic lacunar infarct. No acute intracranial abnormality. Electronically Signed   By: Ashley Royalty M.D.   On: 05/31/2017 01:47   Dg Chest Port 1 View  Result Date: 05/31/2017 CLINICAL DATA:  Weakness and declining health. EXAM: PORTABLE CHEST 1 VIEW COMPARISON:  None. FINDINGS: Cardiomegaly with moderate aortic atherosclerosis. Pulmonary vascular redistribution with small right effusion compatible with CHF. No acute osseous abnormality. IMPRESSION: Cardiomegaly with mild CHF and small right effusion. Electronically Signed   By: Ashley Royalty M.D.   On: 05/31/2017 00:29      Assessment  & Plan: Pt is a 84 y.o. male with a PMHx of osteoarthritis, atrial fibrillation, congestive heart failure, chronic atrial for  ablation, COPD, cryptogenic cirrhosis, diabetes mellitus type 2, gastric varices, history of bladder cancer status post ileal conduit, hypertension, hyperlipidemia,  who was admitted to [facility] on 05/30/2017 for evaluation of generalized weakness.  1.  Acute renal failure, possibly due to reduced PO intake, may have also been on diuretics. 2.  Chronic kidney disease stage III baseline creatinine 1.8. 3.  Severe hyperkalemia potassium 7.2. 4.  Generalized weakness, likely secondary to hyperkalemia. 5.  Anemia of chronic kidney disease hemoglobin 8.1.  Plan: The patient was admitted to [facility] with generalized weakness.  He was found to have severe acute renal failure as well as hyperkalemia.  Serum potassium was 7.2 early this a.m.  We recommend stat potassium repeat now to determine further course of action.  Patient is agreeable to renal replacement therapy if potassium remains high.  We recommend initiation of IV fluids.  We will also evaluate the patient for underlying obstruction with renal ultrasound.  We also plan to check SPEP, UPEP, ANA, ANCA antibodies, GBM antibodies, C3, and C4.  Further plan as patient progresses.  Thanks for consultation.

## 2017-05-31 NOTE — Progress Notes (Signed)
Called lab spoke with [facility] regarding renal panel for 18:00 not populating- mag has resulted. She will look into it.

## 2017-05-31 NOTE — Progress Notes (Signed)
Report received from [place], care assumed. Pt resting quietly in bed, CRRT running without difficulty. Will continue to monitor.

## 2017-05-31 NOTE — H&P (Signed)
[name] is an 84 y.o. male.   Chief Complaint: Weakness HPI: The patient with past medical history of atrial fibrillation, COPD, hemorrhoids, hypertension and diastolic heart failure presents the emergency department complaining of weakness.  The patient's daughter states that his dramatic decline in strength started the day after his varicose vein laser ablation.  Today the patient slid out of bed.  He did not fall or hit his head.  He did not lose consciousness but felt lightheaded and weak.  The patient's primary doctor had given him a fecal occult blood test kit a few weeks ago as he had noticed the patient's blood count gradually dropping over time.  The patient has not seen any frank blood in his stool but admits to dark-colored bowel movements.  He denies abdominal pain or hematemesis.  The patient also denies chest pain but admits to chronic left shoulder pain.  Laboratory evaluation in the emergency department revealed elevated potassium as well as significantly worsened kidney function.  The patient was also hypotensive and bradycardic which prompted the emergency department staff to call the hospitalist service for admission.  Past Medical History:  Diagnosis Date  . Arthritis   . Atrial fibrillation ([place]) 06/23/2010  . B12 deficiency   . C. difficile colitis 07/06/2011  . CHF (congestive heart failure) (HCC)    diastolic. Echo 5/10 with mild LVH, EF 27%, grade 1 diastolic dysfunction, severe left atrial enlargement, aortic sclerosis  . Chronic a-fib (HCC)    on pradaxa, declined cardioversion in past  . COPD (chronic obstructive pulmonary disease) ([place])    (Dr. Raul Del)  . Cryptogenic cirrhosis ([place])    Followed by Dr Fuller Plan, never biopsied, has been stable.  History of gastric varices.  . Diabetes mellitus, type 2 ([place])   . Gastric and duodenal angiodysplasia 06/28/2011  . Gastric varices   . GI bleed   . Hemorrhoids   . History of bladder cancer 1984   s/p ileal conduit  .  History of ileostomy   . History of pneumonia 2012  . Hyperlipidemia   . Hypertension   . Inguinal hernia    left  . Iron deficiency anemia   . Kidney atrophy 02/2016  . Pinched nerve    back  . Pyloric stenosis    mild EGD 10/26/06  . RLL pneumonia ([place]) 01/28/2014  . Shortness of breath     Past Surgical History:  Procedure Laterality Date  . BASAL CELL CARCINOMA EXCISION  2019   multiple sites  . BE  08/08/2003  . BLADDER REMOVAL     s/p urostomy bag  . COLONOSCOPY  07/24/2003   Int. hemorrhoids  . COLONOSCOPY  10/26/2006   Int hemorrhoids, 10 yrs  . ESOPHAGOGASTRODUODENOSCOPY  10/26/2006   Gastric varices, pyloric stenosis  . ESOPHAGOGASTRODUODENOSCOPY  07/01/2011   Procedure: ESOPHAGOGASTRODUODENOSCOPY (EGD);  Surgeon: Jerene Bears, MD;  Location: [facility];  Service: Gastroenterology;  Laterality: N/A;  . ESOPHAGOGASTRODUODENOSCOPY  06/28/2011   Procedure: ESOPHAGOGASTRODUODENOSCOPY (EGD);  Surgeon: Jerene Bears, MD;  Location: [facility];  Service: Gastroenterology;  Laterality: N/A;  . FETAL BLOOD TRANSFUSION    . ILEOSTOMY  1984   bladder cancer  . PENILE PROSTHESIS IMPLANT  1990   inflatable implant  . PROSTATECTOMY  1984   bladder cancer  . US ECHOCARDIOGRAPHY  05/1995   EF 60% TR, AI    Family History  Problem Relation Age of Onset  . Hypertension Mother   . Osteoarthritis Mother   .  Heart disease Father        MI  . Coronary artery disease Sister   . Diabetes Brother   . Heart disease Brother        MI  . Coronary artery disease Sister   . Stroke Other        GPs  . Colon cancer Neg Hx    Social History:  reports that he quit smoking about 27 years ago. His smoking use included cigarettes. He has a 45.00 pack-year smoking history. He has never used smokeless tobacco. He reports that he does not drink alcohol or use drugs.  Allergies: No Known Allergies   (Not in a hospital admission)  Results for orders placed or performed during the  hospital encounter of 05/30/17 (from the past 48 hour(s))  Lactic acid, plasma     Status: None   Collection Time: 05/31/17 12:05 AM  Result Value Ref Range   Lactic Acid, Venous 1.4 0.5 - 1.9 mmol/L    Comment: Performed at [facility], [address] [place], [place] [address]  Urinalysis, Complete w Microscopic     Status: Abnormal   Collection Time: 05/31/17 12:05 AM  Result Value Ref Range   Color, Urine AMBER (A) YELLOW    Comment: BIOCHEMICALS MAY BE AFFECTED BY COLOR   APPearance CLOUDY (A) CLEAR   Specific Gravity, Urine 1.009 1.005 - 1.030   pH 7.0 5.0 - 8.0   Glucose, UA NEGATIVE NEGATIVE mg/dL   Hgb urine dipstick NEGATIVE NEGATIVE   Bilirubin Urine NEGATIVE NEGATIVE   Ketones, ur 5 (A) NEGATIVE mg/dL   Protein, ur 30 (A) NEGATIVE mg/dL   Nitrite NEGATIVE NEGATIVE   Leukocytes, UA MODERATE (A) NEGATIVE   RBC / HPF 0-5 0 - 5 RBC/hpf   WBC, UA >50 (H) 0 - 5 WBC/hpf   Bacteria, UA MANY (A) NONE SEEN   Squamous Epithelial / LPF NONE SEEN 0 - 5    Comment: Please note change in reference range.   WBC Clumps PRESENT    Hyaline Casts, UA PRESENT    Amorphous Crystal PRESENT     Comment: Performed at [facility], [address] [place], [place] [address]  TSH     Status: None   Collection Time: 05/31/17 12:05 AM  Result Value Ref Range   TSH 4.188 0.350 - 4.500 uIU/mL    Comment: Performed by a 3rd Generation assay with a functional sensitivity of <=0.01 uIU/mL. Performed at [facility], [address] [place], [place] [address]   CBC with Differential     Status: Abnormal   Collection Time: 05/31/17 12:08 AM  Result Value Ref Range   WBC 4.4 3.8 - 10.6 K/uL   RBC 2.56 (L) 4.40 - 5.90 MIL/uL   Hemoglobin 8.1 (L) 13.0 - 18.0 g/dL   HCT 25.1 (L) 40.0 - 52.0 %   MCV 97.9 80.0 - 100.0 fL   MCH 31.7 26.0 - 34.0 pg   MCHC 32.3 32.0 - 36.0 g/dL   RDW 16.5 (H) 11.5 - 14.5 %   Platelets 121 (L) 150 - 440 K/uL   Neutrophils Relative % 77  %   Neutro Abs 3.4 1.4 - 6.5 K/uL   Lymphocytes Relative 12 %   Lymphs Abs 0.5 (L) 1.0 - 3.6 K/uL   Monocytes Relative 9 %   Monocytes Absolute 0.4 0.2 - 1.0 K/uL   Eosinophils Relative 2 %   Eosinophils Absolute 0.1 0 - 0.7 K/uL  Basophils Relative 0 %   Basophils Absolute 0.0 0 - 0.1 K/uL    Comment: Performed at [facility], [address] [place], [place] [address]  Comprehensive metabolic panel     Status: Abnormal   Collection Time: 05/31/17 12:08 AM  Result Value Ref Range   Sodium 132 (L) 135 - 145 mmol/L   Potassium 7.2 (HH) 3.5 - 5.1 mmol/L    Comment: CRITICAL RESULT CALLED TO, READ BACK BY AND VERIFIED WITH VANESSA ASHLEY AT 0053 ON 05/31/17 RWW    Chloride 104 101 - 111 mmol/L   CO2 21 (L) 22 - 32 mmol/L   Glucose, Bld 116 (H) 65 - 99 mg/dL   BUN 56 (H) 6 - 20 mg/dL   Creatinine, Ser 4.23 (H) 0.61 - 1.24 mg/dL   Calcium 7.8 (L) 8.9 - 10.3 mg/dL   Total Protein 5.8 (L) 6.5 - 8.1 g/dL   Albumin 2.5 (L) 3.5 - 5.0 g/dL   AST 45 (H) 15 - 41 U/L   ALT 16 (L) 17 - 63 U/L   Alkaline Phosphatase 110 38 - 126 U/L   Total Bilirubin 1.0 0.3 - 1.2 mg/dL   GFR calc non Af Amer 12 (L) >60 mL/min   GFR calc Af Amer 14 (L) >60 mL/min    Comment: (NOTE) The eGFR has been calculated using the CKD EPI equation. This calculation has not been validated in all clinical situations. eGFR's persistently <60 mL/min signify possible Chronic Kidney Disease.    Anion gap 7 5 - 15    Comment: Performed at [facility], [address] [place], [place] [address]  Brain natriuretic peptide     Status: Abnormal   Collection Time: 05/31/17 12:08 AM  Result Value Ref Range   B Natriuretic Peptide 487.0 (H) 0.0 - 100.0 pg/mL    Comment: Performed at [facility], [address] [place], [place] [address]  Troponin I     Status: None   Collection Time: 05/31/17 12:08 AM  Result Value Ref Range   Troponin I <0.03 <0.03 ng/mL    Comment: Performed at [facility], [address] [place], [place] [address]  Protime-INR     Status: Abnormal   Collection Time: 05/31/17 12:11 AM  Result Value Ref Range   Prothrombin Time 17.4 (H) 11.4 - 15.2 seconds   INR 1.44     Comment: Performed at [facility], [address] [place], [place] [address]  Type and screen [place]     Status: None (Preliminary result)   Collection Time: 05/31/17 12:44 AM  Result Value Ref Range   ABO/RH(D) O POS    Antibody Screen POS    Sample Expiration      06/03/2017 Performed at [facility], [place],  [address]    Antibody Identification PENDING    Ct Head Wo Contrast  Result Date: 05/31/2017 CLINICAL DATA:  Altered level of consciousness, patient fell out of bed and is hypotensive with bradycardia. EXAM: CT HEAD WITHOUT CONTRAST TECHNIQUE: Contiguous axial images were obtained from the base of the skull through the vertex without intravenous contrast. COMPARISON:  None. FINDINGS: Brain: Remote appearing right thalamic lacunar infarct. Age related involutional changes of the brain with mild-to-moderate small vessel ischemic disease. No large vascular territory infarction, hemorrhage or midline shift. No intra-axial mass nor extra-axial fluid collections. Vascular: No hyperdense vessel sign. Skull: Intact Sinuses/Orbits: Complete opacification of the included maxillary sinuses  with desiccated mucus seen within. Moderate ethmoid sinus mucosal thickening more so anteriorly. The sphenoid sinus is clear. There is right-sided frontal sinus mucosal opacification. Intact orbits and globes. Other: None IMPRESSION: Paranasal sinus mucosal thickening in opacification as above. Remote appearing small vessel ischemic disease and right thalamic lacunar infarct. No acute intracranial abnormality. Electronically Signed   By: Ashley Royalty M.D.   On: 05/31/2017 01:47   Dg Chest Port 1 View  Result Date:  05/31/2017 CLINICAL DATA:  Weakness and declining health. EXAM: PORTABLE CHEST 1 VIEW COMPARISON:  None. FINDINGS: Cardiomegaly with moderate aortic atherosclerosis. Pulmonary vascular redistribution with small right effusion compatible with CHF. No acute osseous abnormality. IMPRESSION: Cardiomegaly with mild CHF and small right effusion. Electronically Signed   By: Ashley Royalty M.D.   On: 05/31/2017 00:29    Review of Systems  Constitutional: Negative for chills and fever.  HENT: Negative for sore throat and tinnitus.   Eyes: Negative for blurred vision and redness.  Respiratory: Negative for cough and shortness of breath.   Cardiovascular: Negative for chest pain, palpitations, orthopnea and PND.  Gastrointestinal: Positive for melena. Negative for abdominal pain, diarrhea, nausea and vomiting.  Genitourinary: Negative for dysuria, frequency and urgency.  Musculoskeletal: Positive for joint pain (shoulder). Negative for myalgias.  Skin: Negative for rash.       No lesions  Neurological: Positive for weakness. Negative for speech change and focal weakness.  Endo/Heme/Allergies: Does not bruise/bleed easily.       No temperature intolerance  Psychiatric/Behavioral: Negative for depression and suicidal ideas.    Blood pressure (!) 95/43, pulse (!) 48, temperature 97.7 F (36.5 C), temperature source Rectal, resp. rate 12, weight 97.5 kg (215 lb), SpO2 99 %. Physical Exam  Vitals reviewed. Constitutional: He is oriented to person, place, and time. He appears well-developed and well-nourished. No distress.  HENT:  Head: Normocephalic.  Mouth/Throat: Oropharynx is clear and moist.  Eyes: Pupils are equal, round, and reactive to light. Conjunctivae and EOM are normal. No scleral icterus.  Neck: Normal range of motion. Neck supple. No JVD present. No tracheal deviation present. No thyromegaly present.  Cardiovascular: Normal rate, regular rhythm and normal heart sounds. Exam reveals no gallop  and no friction rub.  No murmur heard. Respiratory: Effort normal and breath sounds normal. No respiratory distress.  GI: Soft. Bowel sounds are normal. He exhibits no distension. There is no tenderness.  Genitourinary:  Genitourinary Comments: Deferred  Musculoskeletal: Normal range of motion. He exhibits no edema.  Lymphadenopathy:    He has no cervical adenopathy.  Neurological: He is alert and oriented to person, place, and time. No cranial nerve deficit.  Skin: Skin is warm and dry. No rash noted. No erythema.  Psychiatric: He has a normal mood and affect. His behavior is normal. Judgment and thought content normal.     Assessment/Plan This is an 84 year old male admitted for acute on chronic kidney disease. 1.  Acute on chronic kidney disease: Likely secondary to GI bleed.  Hydrate with intravenous fluid.  Avoid nephrotoxic agents. 2.  Hyperkalemia: No EKG changes.  Patient still makes urine.  No acute need for dialysis, however I have consulted nephrology for further guidance.  Potassium shifted with insulin and Kayexalate.  I have ordered albuterol treatments as well.  Continue to monitor telemetry for arrhythmias. 3.  Melena: Secondary to GI bleed.  May be hemorrhoids.  Continue Protonix.  I have ordered 1 unit RBC transfusion due to hypotension.  (The patient is  fluid responsive).  Hold losartan for now. 4.  Bradycardia: Likely chronic.  Does not seem to be related to the above issues particularly as heart rate would increase due to hemorrhage.  Continue to monitor.  Hold clonidine and nadolol 5.  COPD: Stable; continue inhaled corticosteroid as well as Spiriva.  Albuterol as needed. 6.  Diabetes mellitus type 2: Continue sliding scale insulin while hospitalized 7.  Hyperlipidemia: Continue statin therapy 8.  DVT prophylaxis: SCDs 9.  GI prophylaxis: PPI as above The patient is a full code.  Time spent on admission orders and patient care approximately 45 minutes  Harrie Foreman, MD 05/31/2017, 6:25 AM

## 2017-05-31 NOTE — ED Notes (Signed)
Pt continues to sleep, breathing equal and unlabored, easily aroused. Will continue to assess.

## 2017-05-31 NOTE — Progress Notes (Signed)
Per Dr Holley Raring use 2k bath. MD is aware that potassium elevated 6.5

## 2017-05-31 NOTE — Progress Notes (Signed)
[place] for CRRT medication monitoring   No Known Allergies  Patient Measurements: Height: 5\' 8"  (172.7 cm) Weight: 232 lb 9.4 oz (105.5 kg) IBW/kg (Calculated) : 68.4   Vital Signs: Temp: 97.9 F (36.6 C) (05/07 1500) Temp Source: Oral (05/07 1500) BP: 107/37 (05/07 1500) Pulse Rate: 55 (05/07 1500) Intake/Output from previous day: No intake/output data recorded. Intake/Output from this shift: Total I/O In: 1120 [P.O.:120; I.V.:250; Blood:750] Out: 250 [Urine:250]  Labs: Recent Labs    05/31/17 0008 05/31/17 1026 05/31/17 1533  WBC 4.4  --   --   HGB 8.1*  --   --   HCT 25.1*  --   --   PLT 121*  --   --   CREATININE 4.23* 3.87* 3.60*  PHOS  --  5.1* 4.7*  ALBUMIN 2.5* 2.4* 2.4*  PROT 5.8*  --   --   AST 45*  --   --   ALT 16*  --   --   ALKPHOS 110  --   --   BILITOT 1.0  --   --    Estimated Creatinine Clearance: 18.9 mL/min (A) (by C-G formula based on SCr of 3.6 mg/dL (H)).    Medications:  Scheduled:  . cloNIDine  0.2 mg Oral BID  . docusate sodium  100 mg Oral BID  . doxazosin  4 mg Oral BID  . ferrous sulfate  325 mg Oral Q breakfast  . insulin aspart  0-5 Units Subcutaneous QHS  . insulin aspart  0-9 Units Subcutaneous TID WC  . lidocaine (PF)  30 mL Intradermal STAT  . magnesium oxide  400 mg Oral Daily  . nystatin   Topical TID  . simvastatin  20 mg Oral QHS  . tiotropium  18 mcg Inhalation Daily  . umeclidinium-vilanterol  1 puff Inhalation Daily  . vitamin B-12  250 mcg Oral Daily   Infusions:  . sodium chloride    . sodium chloride 75 mL/hr at 05/31/17 1405  . cefTRIAXone (ROCEPHIN)  IV 1 g (05/31/17 1454)  . octreotide  (SANDOSTATIN)    IV infusion 50 mcg/hr (05/31/17 1456)  . pantoprozole (PROTONIX) infusion 8 mg/hr (05/31/17 1200)  . phenylephrine (NEO-SYNEPHRINE) Adult infusion 40 mcg/min (05/31/17 1719)  . pureflow      Assessment: Pharmacy consulted for CRRT medication  adjustments for 84 yo male initiated on CRRT secondary to severe, persistent hyperkalemia and renal failure.    Plan:  No adjustments warranted at this time.   Pharmacy will continue to monitor and adjust per consult.   Simpson,Michael L 05/31/2017,5:19 PM

## 2017-05-31 NOTE — ED Notes (Signed)
Pt sleeping. Breathing equal and unlabored. Family at the bedside. Will continue to assess.

## 2017-05-31 NOTE — Consult Note (Signed)
[name] , MD [address] [place], [place], [place], [id number] [address], [place], [place], [place], [address] Phone: [phone]  Fax: [phone]  Consultation  Referring Provider: DR Marcille Blanco Primary Care Physician:  Ria Bush, MD Primary Gastroenterologist:  Dr. Vicente Males      Reason for Consultation:     Melena   Date of Admission:  05/30/2017 Date of Consultation:  05/31/2017         HPI:   [name] is a 84 y.o. male who has a history of liver cirrhosis and sees me at the office. He has a history gastric varices, gastric AVM's treated last in 2013/. Etiology of cirrhosis appears cryptogenic.He is on home oxygen. On lasix for CHF.03/2016 - MRCP- shows cirrhosis of the liver with no HCC,hemangioma , no biliary obstruction ,splenomegaly . He was not keen on an EGD to screen for varices when I met him on 04/28/17 . He was on nadolol. He was admitted to the hospital for sliding out of his bed , h/o dark bowel movements recently. On admission noted to be in AKI.   Mild drop in HB since 5 months from 9.4 grams to 8.1 grams today .denies any hematemesis, last few weeks some black colored stool. Denies any NSAID use or abdominal pains.    CBC Latest Ref Rng & Units 05/31/2017 04/19/2017 12/24/2016  WBC 3.8 - 10.6 K/uL 4.4 3.3(L) 3.7(L)  Hemoglobin 13.0 - 18.0 g/dL 8.1(L) 8.9 Repeated and verified X2.(L) 9.4(L)  Hematocrit 40.0 - 52.0 % 25.1(L) 26.3 Repeated and verified X2.(L) 28.6(L)  Platelets 150 - 440 K/uL 121(L) 133.0(L) 106.0(L)      Past Medical History:  Diagnosis Date  . Arthritis   . Atrial fibrillation ([facility]) 06/23/2010  . B12 deficiency   . C. difficile colitis 07/06/2011  . CHF (congestive heart failure) ([facility])    diastolic. Echo 5/10 with mild LVH, EF 61%, grade 1 diastolic dysfunction, severe left atrial enlargement, aortic sclerosis  . Chronic a-fib ([facility])    on pradaxa, declined cardioversion in past  . COPD (chronic obstructive pulmonary disease) ([facility])      (Dr. Raul Del)  . Cryptogenic cirrhosis ([facility])    Followed by Dr Fuller Plan, never biopsied, has been stable.  History of gastric varices.  . Diabetes mellitus, type 2 ([facility])   . Gastric and duodenal angiodysplasia 06/28/2011  . Gastric varices   . GI bleed   . Hemorrhoids   . History of bladder cancer 1984   s/p ileal conduit  . History of ileostomy   . History of pneumonia 2012  . Hyperlipidemia   . Hypertension   . Inguinal hernia    left  . Iron deficiency anemia   . Kidney atrophy 02/2016  . Pinched nerve    back  . Pyloric stenosis    mild EGD 10/26/06  . RLL pneumonia ([facility]) 01/28/2014  . Shortness of breath     Past Surgical History:  Procedure Laterality Date  . BASAL CELL CARCINOMA EXCISION  2019   multiple sites  . BE  08/08/2003  . BLADDER REMOVAL     s/p urostomy bag  . COLONOSCOPY  07/24/2003   Int. hemorrhoids  . COLONOSCOPY  10/26/2006   Int hemorrhoids, 10 yrs  . ESOPHAGOGASTRODUODENOSCOPY  10/26/2006   Gastric varices, pyloric stenosis  . ESOPHAGOGASTRODUODENOSCOPY  07/01/2011   Procedure: ESOPHAGOGASTRODUODENOSCOPY (EGD);  Surgeon: Jerene Bears, MD;  Location: [facility];  Service: Gastroenterology;  Laterality: N/A;  . ESOPHAGOGASTRODUODENOSCOPY  06/28/2011   Procedure: ESOPHAGOGASTRODUODENOSCOPY (EGD);  Surgeon: Jerene Bears, MD;  Location: [facility];  Service: Gastroenterology;  Laterality: N/A;  . FETAL BLOOD TRANSFUSION    . ILEOSTOMY  1984   bladder cancer  . PENILE PROSTHESIS IMPLANT  1990   inflatable implant  . PROSTATECTOMY  1984   bladder cancer  . US ECHOCARDIOGRAPHY  05/1995   EF 60% TR, AI    Prior to Admission medications   Medication Sig Start Date End Date Taking? Authorizing Provider  acetaminophen (TYLENOL) 325 MG tablet Take 325 mg by mouth every 6 (six) hours as needed for mild pain or fever.    Yes [provider]  albuterol (PROVENTIL HFA;VENTOLIN HFA) 108 (90 Base) MCG/ACT inhaler Inhale 2 puffs into the lungs every 6  (six) hours as needed for wheezing.   Yes [provider]  cloNIDine (CATAPRES) 0.2 MG tablet TAKE 1 TABLET BY MOUTH TWICE A DAY 05/30/17  Yes Ria Bush, MD  doxazosin (CARDURA) 8 MG tablet TAKE 1/2 TABLET BY MOUTH TWICE A DAY 03/14/17  Yes Ria Bush, MD  ferrous sulfate (FERRO-BOB) 325 (65 FE) MG tablet Take 1 tablet (325 mg total) by mouth daily with breakfast. 07/06/11  Yes Debbe Odea, MD  glucose blood (ONE TOUCH ULTRA TEST) test strip Use to test sugar once daily and as needed Dx: E11.8 03/15/17  Yes Ria Bush, MD  losartan (COZAAR) 100 MG tablet TAKE 1 TABLET BY MOUTH ONCE DAILY 05/19/17  Yes Ria Bush, MD  magnesium oxide (MAG-OX) 400 MG tablet Take 400 mg by mouth daily.   Yes [provider]  nadolol (CORGARD) 40 MG tablet TAKE 1 TABLET BY MOUTH ONCE DAILY 03/14/17  Yes Ria Bush, MD  ONE TOUCH LANCETS MISC One daily and as needed/ ICD code 250.00    Yes [provider]  pantoprazole (PROTONIX) 40 MG tablet TAKE 1 TABLET BY MOUTH ONCE DAILY 05/09/17  Yes Ria Bush, MD  simvastatin (ZOCOR) 20 MG tablet TAKE 1 TABLET BY MOUTH EVERY DAY Patient taking differently: TAKE 1 TABLET BY MOUTH EVERY NIGHT 03/25/17  Yes Ria Bush, MD  tiotropium (SPIRIVA) 18 MCG inhalation capsule Place 18 mcg into inhaler and inhale daily.   Yes [provider]  vitamin B-12 (CYANOCOBALAMIN) 250 MCG tablet Take 250 mcg by mouth daily.   Yes [provider]  ALPRAZolam Duanne Moron) 0.5 MG tablet Take 0.5 mg by mouth at bedtime as needed for anxiety (For Laser procedure).    [provider]  Celedonio Miyamoto 62.5-25 MCG/INH AEPB  02/25/16   [provider]  furosemide (LASIX) 20 MG tablet TAKE 2 TABLETS BY MOUTH EVERY MORNING AND 1 TABLET IN THE AFTERNOON AS DIRECTED Patient not taking: Reported on 05/31/2017 03/29/17   Ria Bush, MD  triamcinolone cream (KENALOG) 0.1 % Apply 1 application topically 2 (two) times  daily. Apply to AA. Patient not taking: Reported on 05/31/2017 09/09/14   Ria Bush, MD    Family History  Problem Relation Age of Onset  . Hypertension Mother   . Osteoarthritis Mother   . Heart disease Father        MI  . Coronary artery disease Sister   . Diabetes Brother   . Heart disease Brother        MI  . Coronary artery disease Sister   . Stroke Other        GPs  . Colon cancer Neg Hx      Social History  Tobacco Use  . Smoking status: Former Smoker    Packs/day: 1.00    Years: 45.00    Pack years: 45.00    Types: Cigarettes    Last attempt to quit: 06/22/1989    Years since quitting: 27.9  . Smokeless tobacco: Never Used  . Tobacco comment: Quit 1990  Substance Use Topics  . Alcohol use: No    Alcohol/week: 0.6 oz    Types: 1 Standard drinks or equivalent per week  . Drug use: No    Allergies as of 05/30/2017  . (No Known Allergies)    Review of Systems:    All systems reviewed and negative except where noted in HPI.   Physical Exam:  Vital signs in last 24 hours: Temp:  [97.6 F (36.4 C)-97.7 F (36.5 C)] 97.6 F (36.4 C) (05/07 0938) Pulse Rate:  [41-55] 55 (05/07 0949) Resp:  [12-26] 22 (05/07 0949) BP: (80-113)/(26-53) 107/41 (05/07 0938) SpO2:  [95 %-99 %] 96 % (05/07 0949) Weight:  [215 lb (97.5 kg)-232 lb 9.4 oz (105.5 kg)] 232 lb 9.4 oz (105.5 kg) (05/07 0949)   General:   Pleasant, cooperative in NAD Head:  Normocephalic and atraumatic. Eyes:   No icterus.   Conjunctiva pink. PERRLA. Ears:  Normal auditory acuity. Neck:  Supple; no masses or thyroidomegaly Lungs: Respirations even and unlabored. Lungs clear to auscultation bilaterally.   No wheezes, crackles, or rhonchi.  Heart:  Regular rate and rhythm;  Without murmur, clicks, rubs or gallops Abdomen:  Soft, nondistended, nontender. Normal bowel sounds. No appreciable masses or hepatomegaly.  No rebound or guarding.  Neurologic:  Alert and oriented x3;  grossly normal  neurologically. Skin:  Intact without significant lesions or rashes. Cervical Nodes:  No significant cervical adenopathy. Psych:  Alert and cooperative. Normal affect.  LAB RESULTS: Recent Labs    05/31/17 0008  WBC 4.4  HGB 8.1*  HCT 25.1*  PLT 121*   BMET Recent Labs    05/31/17 0008  NA 132*  K 7.2*  CL 104  CO2 21*  GLUCOSE 116*  BUN 56*  CREATININE 4.23*  CALCIUM 7.8*   LFT Recent Labs    05/31/17 0008  PROT 5.8*  ALBUMIN 2.5*  AST 45*  ALT 16*  ALKPHOS 110  BILITOT 1.0   PT/INR Recent Labs    05/31/17 0011  LABPROT 17.4*  INR 1.44    STUDIES: Ct Head Wo Contrast  Result Date: 05/31/2017 CLINICAL DATA:  Altered level of consciousness, patient fell out of bed and is hypotensive with bradycardia. EXAM: CT HEAD WITHOUT CONTRAST TECHNIQUE: Contiguous axial images were obtained from the base of the skull through the vertex without intravenous contrast. COMPARISON:  None. FINDINGS: Brain: Remote appearing right thalamic lacunar infarct. Age related involutional changes of the brain with mild-to-moderate small vessel ischemic disease. No large vascular territory infarction, hemorrhage or midline shift. No intra-axial mass nor extra-axial fluid collections. Vascular: No hyperdense vessel sign. Skull: Intact Sinuses/Orbits: Complete opacification of the included maxillary sinuses with desiccated mucus seen within. Moderate ethmoid sinus mucosal thickening more so anteriorly. The sphenoid sinus is clear. There is right-sided frontal sinus mucosal opacification. Intact orbits and globes. Other: None IMPRESSION: Paranasal sinus mucosal thickening in opacification as above. Remote appearing small vessel ischemic disease and right thalamic lacunar infarct. No acute intracranial abnormality. Electronically Signed   By: Ashley Royalty M.D.   On: 05/31/2017 01:47   Dg Chest Port 1 View  Result Date: 05/31/2017 CLINICAL DATA:  Weakness  and declining health. EXAM: PORTABLE CHEST 1  VIEW COMPARISON:  None. FINDINGS: Cardiomegaly with moderate aortic atherosclerosis. Pulmonary vascular redistribution with small right effusion compatible with CHF. No acute osseous abnormality. IMPRESSION: Cardiomegaly with mild CHF and small right effusion. Electronically Signed   By: Ashley Royalty M.D.   On: 05/31/2017 00:29      Impression / Plan:   [name] is a 84 y.o. y/o male with cirrhosis of the liver admitted with AKI and hyperkalemia which is acute. I have been consulted for melena and drop in Hb. He has not had a recent EGD to screen for varices as he refused. Presently no hematemesis but has had some melena.     Plan  1. Fluid resuscitation and when electrolytes are stable then can perform, EGD will plan for tomorrow.  2. IV octreotide/PPI 3. Antibiotics for SBP prophylaxis for GI bleed in a cirrhotic.    I have discussed alternative options, risks & benefits,  which include, but are not limited to, bleeding, infection, perforation,respiratory complication & drug reaction.  The patient agrees with this plan & written consent will be obtained.    Thank you for involving me in the care of this patient.      LOS: 0 days   Michael Bellows, MD  05/31/2017, 11:27 AM

## 2017-05-31 NOTE — Progress Notes (Signed)
Patient ID: [name], male   DOB: 1934-01-11, 84 y.o.   MRN: [id number]  Pulmonary/critical care attending  Procedure note Dialysis catheter placement Indication: Refractory hyperkalemia Consent is obtained Complete contact barrier precautions utilized  Ultrasound guidance utilized to identify left internal jugular vein which sits on top of the left carotid. Topical Hibiclens utilized Complete contact barrier precautions Subdermal lidocaine given for topical local anesthesia Under ultrasound guidance the left internal jugular vein was cannulated without difficulty The guidewire was placed and the needle was removed Using serial sequential dilators Tricath hemodialysis catheter was placed without difficulty The guidewire was removed intact All 3 ports flushed with dark nonpulsatile venous return Sutured into place Dressed by nurse Stat portable chest x-ray ordered  [employer], D.O.

## 2017-05-31 NOTE — Progress Notes (Signed)
Dr Mortimer Fries notified of heart rate in the low 40's and patient is hypotensive. Orders for NEO. No additional orders.

## 2017-05-31 NOTE — Progress Notes (Signed)
Assumed care at 16:23 pm, report given by Romie Minus

## 2017-05-31 NOTE — Consult Note (Addendum)
Reason for Consult: critical care management Referring Physician: hospitalist  [name] is an 84 y.o. male.  HPI: [name] is an 85 year old gentleman with a past medical history remarkable for COPD, atrial fibrillation, congestive heart failure, gastric varices, gastrointestinal bleeding, prior history of bladder cancer with ileal conduit, ileostomy, hypertension, hyperlipidemia, pneumonia, C. Difficile colitis, anemia both iron and B12 deficiency, renal insufficiencypresented to the emergency department complaining of generalized weakness. Per family he had a decrease in strength after he had a recent varicose vein laser ablation. Patient slid out of bed, no trauma related. Had recent evaluation of his stools by primary care provider, no frank blood appreciated however patient has had anemia. Denies any abdominal pain or hematemesis. Has been complaining of chronic left shoulder discomfort. When he arrived to the emergency department he was noted to be bradycardic, hypotensive, in renal failure with a creatinine of4.23, potassium of 7.2, sodium 132, CO2 of 21, INR of 1.44, hemoglobin of 8.1, platelet count of 121, CT scan of the head revealed old vascular disease,chest x-ray revealed cardiomegaly with vascular congestion and small right pleural effusion  Past Medical History:  Diagnosis Date  . Arthritis   . Atrial fibrillation ([place]) 06/23/2010  . B12 deficiency   . C. difficile colitis 07/06/2011  . CHF (congestive heart failure) (HCC)    diastolic. Echo 5/10 with mild LVH, EF 64%, grade 1 diastolic dysfunction, severe left atrial enlargement, aortic sclerosis  . Chronic a-fib (HCC)    on pradaxa, declined cardioversion in past  . COPD (chronic obstructive pulmonary disease) ([place])    (Dr. Raul Del)  . Cryptogenic cirrhosis ([place])    Followed by Dr Fuller Plan, never biopsied, has been stable.  History of gastric varices.  . Diabetes mellitus, type 2 ([place])   . Gastric and duodenal angiodysplasia  06/28/2011  . Gastric varices   . GI bleed   . Hemorrhoids   . History of bladder cancer 1984   s/p ileal conduit  . History of ileostomy   . History of pneumonia 2012  . Hyperlipidemia   . Hypertension   . Inguinal hernia    left  . Iron deficiency anemia   . Kidney atrophy 02/2016  . Pinched nerve    back  . Pyloric stenosis    mild EGD 10/26/06  . RLL pneumonia ([place]) 01/28/2014  . Shortness of breath     Past Surgical History:  Procedure Laterality Date  . BASAL CELL CARCINOMA EXCISION  2019   multiple sites  . BE  08/08/2003  . BLADDER REMOVAL     s/p urostomy bag  . COLONOSCOPY  07/24/2003   Int. hemorrhoids  . COLONOSCOPY  10/26/2006   Int hemorrhoids, 10 yrs  . ESOPHAGOGASTRODUODENOSCOPY  10/26/2006   Gastric varices, pyloric stenosis  . ESOPHAGOGASTRODUODENOSCOPY  07/01/2011   Procedure: ESOPHAGOGASTRODUODENOSCOPY (EGD);  Surgeon: Jerene Bears, MD;  Location: [place];  Service: Gastroenterology;  Laterality: N/A;  . ESOPHAGOGASTRODUODENOSCOPY  06/28/2011   Procedure: ESOPHAGOGASTRODUODENOSCOPY (EGD);  Surgeon: Jerene Bears, MD;  Location: [place] [facility];  Service: Gastroenterology;  Laterality: N/A;  . FETAL BLOOD TRANSFUSION    . ILEOSTOMY  1984   bladder cancer  . PENILE PROSTHESIS IMPLANT  1990   inflatable implant  . PROSTATECTOMY  1984   bladder cancer  . US ECHOCARDIOGRAPHY  05/1995   EF 60% TR, AI    Family History  Problem Relation Age of Onset  . Hypertension Mother   . Osteoarthritis Mother   . Heart  disease Father        MI  . Coronary artery disease Sister   . Diabetes Brother   . Heart disease Brother        MI  . Coronary artery disease Sister   . Stroke Other        GPs  . Colon cancer Neg Hx     Social History:  reports that he quit smoking about 27 years ago. His smoking use included cigarettes. He has a 45.00 pack-year smoking history. He has never used smokeless tobacco. He reports that he does not drink alcohol or use  drugs.  Allergies: No Known Allergies  Medications: I have reviewed the patient's current medications.  Results for orders placed or performed during the hospital encounter of 05/30/17 (from the past 48 hour(s))  Culture, blood (routine x 2)     Status: None (Preliminary result)   Collection Time: 05/31/17 12:05 AM  Result Value Ref Range   Specimen Description BLOOD RIGHT HAND    Special Requests      BOTTLES DRAWN AEROBIC AND ANAEROBIC Blood Culture adequate volume   Culture      NO GROWTH < 12 HOURS Performed at [facility], [address], [place], [place] [address]    Report Status PENDING   Lactic acid, plasma     Status: None   Collection Time: 05/31/17 12:05 AM  Result Value Ref Range   Lactic Acid, Venous 1.4 0.5 - 1.9 mmol/L    Comment: Performed at [facility], [address] [place], [place] [address]  Urinalysis, Complete w Microscopic     Status: Abnormal   Collection Time: 05/31/17 12:05 AM  Result Value Ref Range   Color, Urine AMBER (A) YELLOW    Comment: BIOCHEMICALS MAY BE AFFECTED BY COLOR   APPearance CLOUDY (A) CLEAR   Specific Gravity, Urine 1.009 1.005 - 1.030   pH 7.0 5.0 - 8.0   Glucose, UA NEGATIVE NEGATIVE mg/dL   Hgb urine dipstick NEGATIVE NEGATIVE   Bilirubin Urine NEGATIVE NEGATIVE   Ketones, ur 5 (A) NEGATIVE mg/dL   Protein, ur 30 (A) NEGATIVE mg/dL   Nitrite NEGATIVE NEGATIVE   Leukocytes, UA MODERATE (A) NEGATIVE   RBC / HPF 0-5 0 - 5 RBC/hpf   WBC, UA >50 (H) 0 - 5 WBC/hpf   Bacteria, UA MANY (A) NONE SEEN   Squamous Epithelial / LPF NONE SEEN 0 - 5    Comment: Please note change in reference range.   WBC Clumps PRESENT    Hyaline Casts, UA PRESENT    Amorphous Crystal PRESENT     Comment: Performed at [facility], [address] [place], [place] [address]  TSH     Status: None   Collection Time: 05/31/17 12:05 AM  Result Value Ref Range   TSH 4.188 0.350 - 4.500 uIU/mL    Comment: Performed by  a 3rd Generation assay with a functional sensitivity of <=0.01 uIU/mL. Performed at [facility], [address] [place], [place] [address]   Culture, blood (routine x 2)     Status: None (Preliminary result)   Collection Time: 05/31/17 12:08 AM  Result Value Ref Range   Specimen Description BLOOD LEFT AC    Special Requests      BOTTLES DRAWN AEROBIC AND ANAEROBIC Blood Culture adequate volume   Culture      NO GROWTH < 12 HOURS Performed at [facility], [address], [place], [place] [address]  Report Status PENDING   CBC with Differential     Status: Abnormal   Collection Time: 05/31/17 12:08 AM  Result Value Ref Range   WBC 4.4 3.8 - 10.6 K/uL   RBC 2.56 (L) 4.40 - 5.90 MIL/uL   Hemoglobin 8.1 (L) 13.0 - 18.0 g/dL   HCT 25.1 (L) 40.0 - 52.0 %   MCV 97.9 80.0 - 100.0 fL   MCH 31.7 26.0 - 34.0 pg   MCHC 32.3 32.0 - 36.0 g/dL   RDW 16.5 (H) 11.5 - 14.5 %   Platelets 121 (L) 150 - 440 K/uL   Neutrophils Relative % 77 %   Neutro Abs 3.4 1.4 - 6.5 K/uL   Lymphocytes Relative 12 %   Lymphs Abs 0.5 (L) 1.0 - 3.6 K/uL   Monocytes Relative 9 %   Monocytes Absolute 0.4 0.2 - 1.0 K/uL   Eosinophils Relative 2 %   Eosinophils Absolute 0.1 0 - 0.7 K/uL   Basophils Relative 0 %   Basophils Absolute 0.0 0 - 0.1 K/uL    Comment: Performed at [facility], [address] [place], [place] [address]  Comprehensive metabolic panel     Status: Abnormal   Collection Time: 05/31/17 12:08 AM  Result Value Ref Range   Sodium 132 (L) 135 - 145 mmol/L   Potassium 7.2 (HH) 3.5 - 5.1 mmol/L    Comment: CRITICAL RESULT CALLED TO, READ BACK BY AND VERIFIED WITH VANESSA ASHLEY AT 0053 ON 05/31/17 RWW    Chloride 104 101 - 111 mmol/L   CO2 21 (L) 22 - 32 mmol/L   Glucose, Bld 116 (H) 65 - 99 mg/dL   BUN 56 (H) 6 - 20 mg/dL   Creatinine, Ser 4.23 (H) 0.61 - 1.24 mg/dL   Calcium 7.8 (L) 8.9 - 10.3 mg/dL   Total Protein 5.8 (L) 6.5 - 8.1 g/dL   Albumin 2.5 (L)  3.5 - 5.0 g/dL   AST 45 (H) 15 - 41 U/L   ALT 16 (L) 17 - 63 U/L   Alkaline Phosphatase 110 38 - 126 U/L   Total Bilirubin 1.0 0.3 - 1.2 mg/dL   GFR calc non Af Amer 12 (L) >60 mL/min   GFR calc Af Amer 14 (L) >60 mL/min    Comment: (NOTE) The eGFR has been calculated using the CKD EPI equation. This calculation has not been validated in all clinical situations. eGFR's persistently <60 mL/min signify possible Chronic Kidney Disease.    Anion gap 7 5 - 15    Comment: Performed at [facility], [address] [place], [place] [address]  Brain natriuretic peptide     Status: Abnormal   Collection Time: 05/31/17 12:08 AM  Result Value Ref Range   B Natriuretic Peptide 487.0 (H) 0.0 - 100.0 pg/mL    Comment: Performed at [facility], [address] [place], [place] [address]  Troponin I     Status: None   Collection Time: 05/31/17 12:08 AM  Result Value Ref Range   Troponin I <0.03 <0.03 ng/mL    Comment: Performed at [facility], [address] [place], [place] [address]  Protime-INR     Status: Abnormal   Collection Time: 05/31/17 12:11 AM  Result Value Ref Range   Prothrombin Time 17.4 (H) 11.4 - 15.2 seconds   INR 1.44     Comment: Performed at [facility], [address], [place],  [address]  Type and screen [facility]     Status: None   Collection Time: 05/31/17 12:44 AM  Result Value Ref Range   ABO/RH(D) O POS    Antibody Screen POS    Sample Expiration 06/03/2017    Antibody Identification ANTI c    PT AG Type      NEGATIVE FOR E ANTIGEN Performed at [facility], [address] [place], [place] [address]   Prepare RBC     Status: None (Preliminary result)   Collection Time: 05/31/17  6:44 AM  Result Value Ref Range   Order Confirmation PENDING   Glucose, capillary     Status: None   Collection Time: 05/31/17  9:38 AM  Result Value Ref Range   Glucose-Capillary 94 65 - 99 mg/dL     Ct Head Wo Contrast  Result Date: 05/31/2017 CLINICAL DATA:  Altered level of consciousness, patient fell out of bed and is hypotensive with bradycardia. EXAM: CT HEAD WITHOUT CONTRAST TECHNIQUE: Contiguous axial images were obtained from the base of the skull through the vertex without intravenous contrast. COMPARISON:  None. FINDINGS: Brain: Remote appearing right thalamic lacunar infarct. Age related involutional changes of the brain with mild-to-moderate small vessel ischemic disease. No large vascular territory infarction, hemorrhage or midline shift. No intra-axial mass nor extra-axial fluid collections. Vascular: No hyperdense vessel sign. Skull: Intact Sinuses/Orbits: Complete opacification of the included maxillary sinuses with desiccated mucus seen within. Moderate ethmoid sinus mucosal thickening more so anteriorly. The sphenoid sinus is clear. There is right-sided frontal sinus mucosal opacification. Intact orbits and globes. Other: None IMPRESSION: Paranasal sinus mucosal thickening in opacification as above. Remote appearing small vessel ischemic disease and right thalamic lacunar infarct. No acute intracranial abnormality. Electronically Signed   By: Ashley Royalty M.D.   On: 05/31/2017 01:47   Dg Chest Port 1 View  Result Date: 05/31/2017 CLINICAL DATA:  Weakness and declining health. EXAM: PORTABLE CHEST 1 VIEW COMPARISON:  None. FINDINGS: Cardiomegaly with moderate aortic atherosclerosis. Pulmonary vascular redistribution with small right effusion compatible with CHF. No acute osseous abnormality. IMPRESSION: Cardiomegaly with mild CHF and small right effusion. Electronically Signed   By: Ashley Royalty M.D.   On: 05/31/2017 00:29    ROS Blood pressure (!) 107/41, pulse (!) 55, temperature 97.6 F (36.4 C), temperature source Oral, resp. rate (!) 22, height 5' 8"  (1.727 m), weight 232 lb 9.4 oz (105.5 kg), SpO2 96 %. Physical Exam Patient is awake, alert, oriented, in no acute  distress Vital signs: Please see the above listed vital signs. Patient is bradycardic and in atrial fibrillation. Blood pressure is 110/70 HEENT: No oral lesions appreciated, trachea is midline, no thyromegaly noted Cardiovascular: Bradycardic rhythm irregularly irregular with systolic murmur left parasternal border Pulmonary: Right basilar crackles Abdominal: Positive bowel sounds, soft exam Extremities: Intertriginous fungal infection along with mild lower [place] edema Neurologic: Patient moves all extremities   Assessment/Plan:  Renal failure with Hyperkalemia. Will check stat BMP and CK post procedure. If continues to be elevated may need HD and dialysis catheter. Prior history of bladder CA with ileostomy will obtained renal US  Anemia. Possible GI blood loss. No overt bleeding, last Hg of 8.1 GI consult pending.   Hx of CHF. Pending ECHO.   Atrial Fibrillation. Bradycardic hemodynamics marginal.    Critical Care Time 45 minutes.    Kaedan Richert 05/31/2017, 9:56 AM

## 2017-05-31 NOTE — Progress Notes (Signed)
[facility] [place] NAME: [name]    MR#:  [id number]  DATE OF BIRTH:  [birth year]  SUBJECTIVE:  Patient seen and evaluated today Has some soreness in the left shoulder No chest pain No shortness of breath, palpitations Has generalized weakness   REVIEW OF SYSTEMS:    ROS  CONSTITUTIONAL: No documented fever. Has fatigue, weakness. No weight gain, no weight loss.  EYES: No blurry or double vision.  ENT: No tinnitus. No postnasal drip. No redness of the oropharynx.  RESPIRATORY: No cough, no wheeze, no hemoptysis. No dyspnea.  CARDIOVASCULAR: No chest pain. No orthopnea. No palpitations. No syncope.  GASTROINTESTINAL: No nausea, no vomiting or diarrhea. No abdominal pain. No melena or hematochezia.  GENITOURINARY: No dysuria or hematuria.  ENDOCRINE: No polyuria or nocturia. No heat or cold intolerance.  HEMATOLOGY: No anemia. No bruising. No bleeding.  INTEGUMENTARY: No rashes. No lesions.  MUSCULOSKELETAL: No arthritis. No swelling. No gout.  NEUROLOGIC: No numbness, tingling, or ataxia. No seizure-type activity.  PSYCHIATRIC: No anxiety. No insomnia. No ADD.   DRUG ALLERGIES:  No Known Allergies  VITALS:  Blood pressure (!) 102/40, pulse (!) 56, temperature 97.6 F (36.4 C), temperature source Oral, resp. rate 19, height 5\' 8"  (1.727 m), weight 105.5 kg (232 lb 9.4 oz), SpO2 97 %.  PHYSICAL EXAMINATION:   Physical Exam  GENERAL:  84 y.o.-year-old patient lying in the bed with no acute distress.  EYES: Pupils equal, round, reactive to light and accommodation. No scleral icterus. Extraocular muscles intact.  HEENT: Head atraumatic, normocephalic. Oropharynx dry and nasopharynx clear.  NECK:  Supple, no jugular venous distention. No thyroid enlargement, no tenderness.  LUNGS: Normal breath sounds bilaterally, no wheezing, rales, rhonchi. No use of accessory muscles of respiration.  CARDIOVASCULAR: S1, S2 normal. No murmurs,  rubs, or gallops.  ABDOMEN: Soft, nontender, nondistended. Bowel sounds present. No organomegaly or mass.  EXTREMITIES: No cyanosis, clubbing or edema b/l.    NEUROLOGIC: Cranial nerves II through XII are intact. No focal Motor or sensory deficits b/l.   PSYCHIATRIC: The patient is alert and oriented x 3.  SKIN: No obvious rash, lesion, or ulcer.   LABORATORY PANEL:   CBC Recent Labs  Lab 05/31/17 0008  WBC 4.4  HGB 8.1*  HCT 25.1*  PLT 121*   ------------------------------------------------------------------------------------------------------------------ Chemistries  Recent Labs  Lab 05/31/17 0008 05/31/17 1026  NA 132* 135  K 7.2* 6.5*  CL 104 108  CO2 21* 23  GLUCOSE 116* 98  BUN 56* 57*  CREATININE 4.23* 3.87*  CALCIUM 7.8* 7.5*  AST 45*  --   ALT 16*  --   ALKPHOS 110  --   BILITOT 1.0  --    ------------------------------------------------------------------------------------------------------------------  Cardiac Enzymes Recent Labs  Lab 05/31/17 0008  TROPONINI <0.03   ------------------------------------------------------------------------------------------------------------------  RADIOLOGY:  Ct Head Wo Contrast  Result Date: 05/31/2017 CLINICAL DATA:  Altered level of consciousness, patient fell out of bed and is hypotensive with bradycardia. EXAM: CT HEAD WITHOUT CONTRAST TECHNIQUE: Contiguous axial images were obtained from the base of the skull through the vertex without intravenous contrast. COMPARISON:  None. FINDINGS: Brain: Remote appearing right thalamic lacunar infarct. Age related involutional changes of the brain with mild-to-moderate small vessel ischemic disease. No large vascular territory infarction, hemorrhage or midline shift. No intra-axial mass nor extra-axial fluid collections. Vascular: No hyperdense vessel sign. Skull: Intact Sinuses/Orbits: Complete opacification of the included maxillary sinuses with desiccated mucus seen within.  Moderate ethmoid sinus mucosal thickening more so anteriorly. The sphenoid sinus is clear. There is right-sided frontal sinus mucosal opacification. Intact orbits and globes. Other: None IMPRESSION: Paranasal sinus mucosal thickening in opacification as above. Remote appearing small vessel ischemic disease and right thalamic lacunar infarct. No acute intracranial abnormality. Electronically Signed   By: Ashley Royalty M.D.   On: 05/31/2017 01:47   US Renal  Result Date: 05/31/2017 CLINICAL DATA:  84 year old male with renal failure. Bladder cancer post cystectomy. Initial encounter. EXAM: RENAL / URINARY TRACT ULTRASOUND COMPLETE COMPARISON:  04/01/2016 MR. FINDINGS: Right Kidney: Length: 10.6 cm. Thin renal cortex. No hydronephrosis. Upper pole 4.7 x 4.8 x 5.5 cm cyst. Left Kidney: Length: 10.1 cm. Thin renal parenchyma. No hydronephrosis or mass. Bladder: Surgically removed. Ascites. Heterogeneous liver with lobular contour consistent with history of cirrhosis. Splenomegaly. IMPRESSION: Bilateral thin renal parenchyma. No hydronephrosis. 5.5 cm right renal cyst. Urinary bladder surgically removed. Ascites around the liver and spleen. Heterogeneous liver with lobular contour consistent with history of cirrhosis. Splenomegaly. Liver and spleen not completely assessed. Electronically Signed   By: Genia Del M.D.   On: 05/31/2017 12:31   Dg Chest Port 1 View  Result Date: 05/31/2017 CLINICAL DATA:  Status post central line placement EXAM: PORTABLE CHEST 1 VIEW COMPARISON:  05/30/2017 FINDINGS: Cardiac shadow is stable. New left jugular temporary dialysis catheter is seen in satisfactory position. No pneumothorax is noted. Patchy atelectatic changes are noted throughout both lungs. No focal confluent infiltrate is seen. IMPRESSION: No pneumothorax following central line placement. Electronically Signed   By: Inez Catalina M.D.   On: 05/31/2017 13:13   Dg Chest Port 1 View  Result Date: 05/31/2017 CLINICAL DATA:   Weakness and declining health. EXAM: PORTABLE CHEST 1 VIEW COMPARISON:  None. FINDINGS: Cardiomegaly with moderate aortic atherosclerosis. Pulmonary vascular redistribution with small right effusion compatible with CHF. No acute osseous abnormality. IMPRESSION: Cardiomegaly with mild CHF and small right effusion. Electronically Signed   By: Ashley Royalty M.D.   On: 05/31/2017 00:29     ASSESSMENT AND PLAN:  84 year old ill patient with history of for diabetes mellitus, COPD, hyperlipidemia, chronic kidney disease currently in stepdown unit for hyperkalemia and worsening renal function  -Acute on chronic renal failure Nephrology follow-up IV fluid hydration  -Hyperkalemia Persistently high Patient already received insulin and Kayexalate Monitor for erythremia's Patient is due for dialysis by nephrology  -Melena secondary to GI bleed Currently on Protonix PRBC transfusion  -COPD stable  -Type 2 diabetes mellitus Sliding scale coverage with insulin  -DVT prophylaxis Sequential compression device to lower extremities   All the records are reviewed and case discussed with Care Management/Social Worker. Management plans discussed with the patient, family and they are in agreement.  CODE STATUS: Full Code  DVT Prophylaxis: SCDs  TOTAL TIME TAKING CARE OF THIS PATIENT: 35 minutes.   POSSIBLE D/C IN 2 to 3 DAYS, DEPENDING ON CLINICAL CONDITION.  Saundra Shelling M.D on 05/31/2017 at 1:45 PM  Between 7am to 6pm - Pager - [phone]  After 6pm go to [email or web address] - password [email or web address] [facility]  Office  [phone]  CC: Primary care physician; Ria Bush, MD  Note: This dictation was prepared with Dragon dictation along with smaller phrase technology. Any transcriptional errors that result from this process are unintentional.

## 2017-05-31 NOTE — ED Triage Notes (Signed)
Pt arrived via EMS from home where EMS reports family called out due to pt falling out of bed and all over weakness. Pt is hypotensive and bradycardic on arrival to ED. Pt has hx.o GI bleed. Pt is pale in color, A&O to person, place but not time and situation. MD at bedside.

## 2017-06-01 ENCOUNTER — Inpatient Hospital Stay: Payer: PPO | Admitting: Anesthesiology

## 2017-06-01 ENCOUNTER — Encounter
Admission: EM | Disposition: A | Discharge status: Home Health | Payer: Self-pay | Source: Home / Self Care | Attending: Specialist

## 2017-06-01 DIAGNOSIS — K2970 Gastritis, unspecified, without bleeding: Secondary | ICD-10-CM

## 2017-06-01 HISTORY — PX: ESOPHAGOGASTRODUODENOSCOPY (EGD) WITH PROPOFOL: SHX5813

## 2017-06-01 LAB — ENA+DNA/DS+ANTICH+CENTRO+JO...
Anti JO-1: <0.2 AI (ref 0.0–0.9)
Chromatin Ab SerPl-aCnc: <0.2 AI (ref 0.0–0.9)
ENA SM Ab Ser-aCnc: <0.2 AI (ref 0.0–0.9)
RIBONUCLEIC PROTEIN: 1.9 AI — AB (ref 0.0–0.9)
SSB (La) (ENA) Antibody, IgG: <0.2 AI (ref 0.0–0.9)
Scleroderma (Scl-70) (ENA) Antibody, IgG: <0.2 AI (ref 0.0–0.9)
ds DNA Ab: 3 IU/mL (ref 0–9)

## 2017-06-01 LAB — RENAL FUNCTION PANEL
ALBUMIN: 2.4 g/dL — AB (ref 3.5–5.0)
ANION GAP: 4 — AB (ref 5–15)
Albumin: 2.4 g/dL — ABNORMAL LOW (ref 3.5–5.0)
Anion gap: 6 (ref 5–15)
BUN: 34 mg/dL — ABNORMAL HIGH (ref 6–20)
BUN: 35 mg/dL — ABNORMAL HIGH (ref 6–20)
CALCIUM: 7.7 mg/dL — AB (ref 8.9–10.3)
CO2: 25 mmol/L (ref 22–32)
CO2: 24 mmol/L (ref 22–32)
CREATININE: 2.32 mg/dL — AB (ref 0.61–1.24)
Calcium: 7.5 mg/dL — ABNORMAL LOW (ref 8.9–10.3)
Chloride: 107 mmol/L (ref 101–111)
Chloride: 105 mmol/L (ref 101–111)
Creatinine, Ser: 2.26 mg/dL — ABNORMAL HIGH (ref 0.61–1.24)
GFR calc Af Amer: 30 mL/min — ABNORMAL LOW (ref >60)
GFR calc non Af Amer: 26 mL/min — ABNORMAL LOW (ref >60)
GFR calc non Af Amer: 25 mL/min — ABNORMAL LOW (ref >60)
GFR, EST AFRICAN AMERICAN: 29 mL/min — AB (ref >60)
GLUCOSE: 102 mg/dL — AB (ref 65–99)
Glucose, Bld: 102 mg/dL — ABNORMAL HIGH (ref 65–99)
POTASSIUM: 5.2 mmol/L — AB (ref 3.5–5.1)
Phosphorus: 3.4 mg/dL (ref 2.5–4.6)
Phosphorus: 3.7 mg/dL (ref 2.5–4.6)
Potassium: 5.1 mmol/L (ref 3.5–5.1)
SODIUM: 135 mmol/L (ref 135–145)
Sodium: 136 mmol/L (ref 135–145)

## 2017-06-01 LAB — PROTEIN ELECTROPHORESIS, SERUM
A/G RATIO SPE: 0.9 (ref 0.7–1.7)
Albumin ELP: 2.5 g/dL — ABNORMAL LOW (ref 2.9–4.4)
Alpha-1-Globulin: 0.2 g/dL (ref 0.0–0.4)
Alpha-2-Globulin: 0.5 g/dL (ref 0.4–1.0)
Beta Globulin: 0.8 g/dL (ref 0.7–1.3)
GLOBULIN, TOTAL: 2.7 g/dL (ref 2.2–3.9)
Gamma Globulin: 1.2 g/dL (ref 0.4–1.8)
Total Protein ELP: 5.2 g/dL — ABNORMAL LOW (ref 6.0–8.5)

## 2017-06-01 LAB — MPO/PR-3 (ANCA) ANTIBODIES: Myeloperoxidase Abs: <9 U/mL (ref 0.0–9.0)

## 2017-06-01 LAB — MAGNESIUM
MAGNESIUM: 2.1 mg/dL (ref 1.7–2.4)
Magnesium: 2.6 mg/dL — ABNORMAL HIGH (ref 1.7–2.4)
Magnesium: 2.2 mg/dL (ref 1.7–2.4)
Magnesium: 2.1 mg/dL (ref 1.7–2.4)

## 2017-06-01 LAB — URINE CULTURE

## 2017-06-01 LAB — GLUCOSE, CAPILLARY
Glucose-Capillary: 92 mg/dL (ref 65–99)
Glucose-Capillary: 94 mg/dL (ref 65–99)
Glucose-Capillary: 94 mg/dL (ref 65–99)
Glucose-Capillary: 91 mg/dL (ref 65–99)

## 2017-06-01 LAB — PHOSPHORUS: PHOSPHORUS: 4.6 mg/dL (ref 2.5–4.6)

## 2017-06-01 LAB — C3 COMPLEMENT: C3 Complement: 92 mg/dL (ref 82–167)

## 2017-06-01 LAB — GLOMERULAR BASEMENT MEMBRANE ANTIBODIES: GBM Ab: 6 units (ref 0–20)

## 2017-06-01 LAB — ANA W/REFLEX IF POSITIVE: Anti Nuclear Antibody(ANA): POSITIVE — AB

## 2017-06-01 LAB — C4 COMPLEMENT: COMPLEMENT C4, BODY FLUID: 17 mg/dL (ref 14–44)

## 2017-06-01 SURGERY — ESOPHAGOGASTRODUODENOSCOPY (EGD) WITH PROPOFOL
Anesthesia: General

## 2017-06-01 MED ORDER — MORPHINE SULFATE (PF) 2 MG/ML IV SOLN
1.0000 mg | Freq: Four times a day (QID) | INTRAVENOUS | Status: DC | PRN
  Administered 2017-06-01 (×2): 1 mg via INTRAVENOUS
  Filled 2017-06-01 (×2): qty 1

## 2017-06-01 MED ORDER — PHENYLEPHRINE HCL 10 MG/ML IJ SOLN
0.0000 ug/min | INTRAMUSCULAR | Status: DC
  Administered 2017-06-01: 120 ug/min via INTRAVENOUS
  Administered 2017-06-01: 70 ug/min via INTRAVENOUS
  Administered 2017-06-01: 60 ug/min via INTRAVENOUS
  Administered 2017-06-02: 190 ug/min via INTRAVENOUS
  Administered 2017-06-02: 180 ug/min via INTRAVENOUS
  Administered 2017-06-02: 220 ug/min via INTRAVENOUS
  Administered 2017-06-02: 150 ug/min via INTRAVENOUS
  Administered 2017-06-02: 120 ug/min via INTRAVENOUS
  Administered 2017-06-03: 110 ug/min via INTRAVENOUS
  Administered 2017-06-03: 90 ug/min via INTRAVENOUS
  Filled 2017-06-01: qty 4
  Filled 2017-06-01 (×2): qty 40
  Filled 2017-06-01: qty 4
  Filled 2017-06-01 (×2): qty 40
  Filled 2017-06-01 (×3): qty 4
  Filled 2017-06-01 (×2): qty 40

## 2017-06-01 MED ORDER — SODIUM CHLORIDE 0.9 % IV SOLN: INTRAVENOUS | Status: DC

## 2017-06-01 MED ORDER — PROPOFOL 10 MG/ML IV BOLUS
INTRAVENOUS | Status: DC | PRN
  Administered 2017-06-01: 20 mg via INTRAVENOUS
  Administered 2017-06-01: 10 mg via INTRAVENOUS

## 2017-06-01 NOTE — Progress Notes (Signed)
Pre HD assessment, pt returned from endoscopy alert/stable c/o soreness. RN present and will admin PRN medication for pain. Pt stable to start tx.    06/01/17 1526  Neurological  Level of Consciousness Alert  Orientation Level Oriented X4  Respiratory  Respiratory Pattern Regular  Chest Assessment Chest expansion symmetrical  Bilateral Breath Sounds Diminished  Cough Non-productive  Cardiac  Pulse Regular  Heart Sounds S1, S2  ECG Monitor Yes  Vascular  R Radial Pulse +2  L Radial Pulse +2  Edema Generalized  Psychosocial  Psychosocial (WDL) WDL

## 2017-06-01 NOTE — Progress Notes (Signed)
Post HD assessment, no change in orientation from previous assessment HD tx tolerated well.    06/01/17 1839  Neurological  Level of Consciousness Alert  Orientation Level Oriented X4  Respiratory  Respiratory Pattern Regular  Chest Assessment Chest expansion symmetrical  Bilateral Breath Sounds Diminished  Cough Non-productive  Cardiac  Pulse Regular  Heart Sounds S1, S2  ECG Monitor Yes  Vascular  R Radial Pulse +2  L Radial Pulse +2  Edema Generalized  Psychosocial  Psychosocial (WDL) WDL

## 2017-06-01 NOTE — Transfer of Care (Signed)
Immediate Anesthesia Transfer of Care Note  Patient: [name]  Procedure(s) Performed: ESOPHAGOGASTRODUODENOSCOPY (EGD) WITH PROPOFOL (N/A )  Patient Location: PACU  Anesthesia Type:General  Level of Consciousness: awake and alert   Airway & Oxygen Therapy: Patient Spontanous Breathing and Patient connected to nasal cannula oxygen  Post-op Assessment: Report given to RN and Post -op Vital signs reviewed and stable  Post vital signs: Reviewed and stable  Last Vitals:  Vitals Value Taken Time  BP    Temp    Pulse    Resp    SpO2      Last Pain:  Vitals:   06/01/17 1427  TempSrc: Tympanic  PainSc: 0-No pain      Patients Stated Pain Goal: 2 (73/56/70 1410)  Complications: No apparent anesthesia complications

## 2017-06-01 NOTE — Progress Notes (Signed)
Pre HD Tx    06/01/17 1526  Vital Signs  Temp 98.7 F (37.1 C)  Temp Source Oral  Pulse Rate 64  Pulse Rate Source Monitor  Resp 18  BP (!) 112/34  BP Location Right Arm  BP Method Automatic  Patient Position (if appropriate) Lying  Oxygen Therapy  SpO2 95 %  O2 Device Nasal Cannula  O2 Flow Rate (L/min) 4 L/min  Pulse Oximetry Type Continuous  Pain Assessment  Pain Scale 0-10  Pain Score 0  Dialysis Weight  Weight 111.8 kg (246 lb 7.6 oz)  Type of Weight Pre-Dialysis  Time-Out for Hemodialysis  What Procedure? HD  Pt Identifiers(min of two) First/Last Name;MRN/Account#  Correct Site? Yes  Correct Side? Yes  Correct Procedure? Yes  Consents Verified? Yes  Rad Studies Available? N/A  Safety Precautions Reviewed? Yes  Engineer, civil (consulting) Number [phone]  Station Number  (Bedside ICU )  UF/Alarm Test Passed  Conductivity: Meter 14  Conductivity: Machine  14  pH 7.2  Reverse Osmosis Portable   Normal Saline Lot Number [id number]  Dialyzer Lot Number [id number]  Disposable Set Lot Number [id number]  Machine Temperature 98.6 F (37 C)  Musician and Audible Yes  Blood Lines Intact and Secured Yes  Pre Treatment Patient Checks  Vascular access used during treatment Catheter  Hepatitis B Surface Antigen Results  (unk)  Isolation Initiated Yes  Hemodialysis Consent Verified Yes  Hemodialysis Standing Orders Initiated Yes  ECG (Telemetry) Monitor On Yes  Prime Ordered Normal Saline  Length of  DialysisTreatment -hour(s) 3 Hour(s)  Dialysis Treatment Comments Na 140  Dialyzer Elisio 17H NR  Dialysate 2K, 2.5 Ca  Dialysis Anticoagulant None  Dialysate Flow Ordered 500  Blood Flow Rate Ordered 250 mL/min  Ultrafiltration Goal 0 Liters  Pre Treatment Labs Hepatitis B Surface Antigen;Phosphorus  Dialysis Blood Pressure Support Ordered Normal Saline  Education / Care Plan  Dialysis Education Provided Yes  Documented Education in Care Plan Yes

## 2017-06-01 NOTE — Progress Notes (Signed)
Post HD Tx    06/01/17 1833  Hand-Off documentation  Report given to (Full Name) Javier Glazier   Report received from (Full Name) Beatris Ship, RN   Vital Signs  Temp 98.7 F (37.1 C)  Temp Source Oral  Pulse Rate 74  Pulse Rate Source Monitor  Resp 20  BP (!) 123/38  BP Location Right Arm  BP Method Automatic  Patient Position (if appropriate) Lying  Oxygen Therapy  SpO2 93 %  O2 Device Nasal Cannula  O2 Flow Rate (L/min) 4 L/min  Pulse Oximetry Type Continuous  Pain Assessment  Pain Scale 0-10  Pain Score 0  Dialysis Weight  Weight 111.9 kg (246 lb 11.1 oz)  Type of Weight Post-Dialysis  Post-Hemodialysis Assessment  Rinseback Volume (mL) 250 mL  Dialyzer Clearance Clear  Duration of HD Treatment -hour(s) 3 hour(s)  Hemodialysis Intake (mL) 500 mL  UF Total -Machine (mL) 500 mL  Net UF (mL) 0 mL  Post-Hemodialysis Comments Pt slept through tx, tolerated well.   Hemodialysis Catheter Left Internal jugular Triple-lumen  Placement Date/Time: 05/31/17 1200   Placed prior to admission: No  Time Out: Correct patient;Correct site;Correct procedure  Maximum sterile barrier precautions: Hand hygiene;Sterile gloves;Cap;Large sterile sheet;Mask;Sterile gown  Site Prep: Chlorh...  Blue Lumen Status Heparin locked  Red Lumen Status Heparin locked  Post treatment catheter status Capped and Clamped

## 2017-06-01 NOTE — Progress Notes (Signed)
[facility] NAME: [name]    MR#:  [id number]  DATE OF BIRTH:  12/01/1933  SUBJECTIVE:  Patient seen and evaluated today No chest pain No shortness of breath, palpitations Has generalized weakness He is on IV pressor medication to support blood pressure Currently on CRRT   REVIEW OF SYSTEMS:    ROS  CONSTITUTIONAL: No documented fever. Has fatigue, weakness. No weight gain, no weight loss.  EYES: No blurry or double vision.  ENT: No tinnitus. No postnasal drip. No redness of the oropharynx.  RESPIRATORY: No cough, no wheeze, no hemoptysis. No dyspnea.  CARDIOVASCULAR: No chest pain. No orthopnea. No palpitations. No syncope.  GASTROINTESTINAL: No nausea, no vomiting or diarrhea. No abdominal pain. No melena or hematochezia.  GENITOURINARY: No dysuria or hematuria.  ENDOCRINE: No polyuria or nocturia. No heat or cold intolerance.  HEMATOLOGY: No anemia. No bruising. No bleeding.  INTEGUMENTARY: No rashes. No lesions.  MUSCULOSKELETAL: No arthritis. No swelling. No gout.  NEUROLOGIC: No numbness, tingling, or ataxia. No seizure-type activity.  PSYCHIATRIC: No anxiety. No insomnia. No ADD.   DRUG ALLERGIES:  No Known Allergies  VITALS:  Blood pressure (!) 112/34, pulse 63, temperature 98.7 F (37.1 C), temperature source Tympanic, resp. rate 18, height 5\' 8"  (1.727 m), weight 111.6 kg (246 lb), SpO2 95 %.  PHYSICAL EXAMINATION:   Physical Exam  GENERAL:  84 y.o.-year-old patient lying in the bed with no acute distress.  EYES: Pupils equal, round, reactive to light and accommodation. No scleral icterus. Extraocular muscles intact.  HEENT: Head atraumatic, normocephalic. Oropharynx dry and nasopharynx clear.  NECK:  Supple, no jugular venous distention. No thyroid enlargement, no tenderness.  LUNGS: Normal breath sounds bilaterally, no wheezing, rales, rhonchi. No use of accessory muscles of respiration.  CARDIOVASCULAR:  S1, S2 normal. No murmurs, rubs, or gallops.  ABDOMEN: Soft, nontender, nondistended. Bowel sounds present. No organomegaly or mass.  EXTREMITIES: No cyanosis, clubbing or edema b/l.    NEUROLOGIC: Cranial nerves II through XII are intact. No focal Motor or sensory deficits b/l.   PSYCHIATRIC: The patient is alert and oriented x 3.  SKIN: No obvious rash, lesion, or ulcer.   LABORATORY PANEL:   CBC Recent Labs  Lab 05/31/17 2145  WBC 4.8  HGB 8.9*  HCT 27.1*  PLT 125*   ------------------------------------------------------------------------------------------------------------------ Chemistries  Recent Labs  Lab 05/31/17 0008  06/01/17 0602  NA 132*   < > 135  K 7.2*   < > 5.1  CL 104   < > 105  CO2 21*   < > 24  GLUCOSE 116*   < > 102*  BUN 56*   < > 35*  CREATININE 4.23*   < > 2.32*  CALCIUM 7.8*   < > 7.7*  MG  --    < > 2.1  AST 45*  --   --   ALT 16*  --   --   ALKPHOS 110  --   --   BILITOT 1.0  --   --    < > = values in this interval not displayed.   ------------------------------------------------------------------------------------------------------------------  Cardiac Enzymes Recent Labs  Lab 05/31/17 0008  TROPONINI <0.03   ------------------------------------------------------------------------------------------------------------------  RADIOLOGY:  Ct Head Wo Contrast  Result Date: 05/31/2017 CLINICAL DATA:  Altered level of consciousness, patient fell out of bed and is hypotensive with bradycardia. EXAM: CT HEAD WITHOUT CONTRAST TECHNIQUE: Contiguous axial images were obtained from the base of  the skull through the vertex without intravenous contrast. COMPARISON:  None. FINDINGS: Brain: Remote appearing right thalamic lacunar infarct. Age related involutional changes of the brain with mild-to-moderate small vessel ischemic disease. No large vascular territory infarction, hemorrhage or midline shift. No intra-axial mass nor extra-axial fluid  collections. Vascular: No hyperdense vessel sign. Skull: Intact Sinuses/Orbits: Complete opacification of the included maxillary sinuses with desiccated mucus seen within. Moderate ethmoid sinus mucosal thickening more so anteriorly. The sphenoid sinus is clear. There is right-sided frontal sinus mucosal opacification. Intact orbits and globes. Other: None IMPRESSION: Paranasal sinus mucosal thickening in opacification as above. Remote appearing small vessel ischemic disease and right thalamic lacunar infarct. No acute intracranial abnormality. Electronically Signed   By: Ashley Royalty M.D.   On: 05/31/2017 01:47   US Renal  Result Date: 05/31/2017 CLINICAL DATA:  84 year old male with renal failure. Bladder cancer post cystectomy. Initial encounter. EXAM: RENAL / URINARY TRACT ULTRASOUND COMPLETE COMPARISON:  04/01/2016 MR. FINDINGS: Right Kidney: Length: 10.6 cm. Thin renal cortex. No hydronephrosis. Upper pole 4.7 x 4.8 x 5.5 cm cyst. Left Kidney: Length: 10.1 cm. Thin renal parenchyma. No hydronephrosis or mass. Bladder: Surgically removed. Ascites. Heterogeneous liver with lobular contour consistent with history of cirrhosis. Splenomegaly. IMPRESSION: Bilateral thin renal parenchyma. No hydronephrosis. 5.5 cm right renal cyst. Urinary bladder surgically removed. Ascites around the liver and spleen. Heterogeneous liver with lobular contour consistent with history of cirrhosis. Splenomegaly. Liver and spleen not completely assessed. Electronically Signed   By: Genia Del M.D.   On: 05/31/2017 12:31   Dg Chest Port 1 View  Result Date: 05/31/2017 CLINICAL DATA:  Status post central line placement EXAM: PORTABLE CHEST 1 VIEW COMPARISON:  05/30/2017 FINDINGS: Cardiac shadow is stable. New left jugular temporary dialysis catheter is seen in satisfactory position. No pneumothorax is noted. Patchy atelectatic changes are noted throughout both lungs. No focal confluent infiltrate is seen. IMPRESSION: No  pneumothorax following central line placement. Electronically Signed   By: Inez Catalina M.D.   On: 05/31/2017 13:13   Dg Chest Port 1 View  Result Date: 05/31/2017 CLINICAL DATA:  Weakness and declining health. EXAM: PORTABLE CHEST 1 VIEW COMPARISON:  None. FINDINGS: Cardiomegaly with moderate aortic atherosclerosis. Pulmonary vascular redistribution with small right effusion compatible with CHF. No acute osseous abnormality. IMPRESSION: Cardiomegaly with mild CHF and small right effusion. Electronically Signed   By: Ashley Royalty M.D.   On: 05/31/2017 00:29     ASSESSMENT AND PLAN:  84 year old ill patient with history of for diabetes mellitus, COPD, hyperlipidemia, chronic kidney disease currently in stepdown unit for hyperkalemia and worsening renal function  -Acute on chronic renal failure CKD stage III baseline Nephrology follow-up On CRRT Ultimately intermittent hemodialysis  -Hyperkalemia improved after dialysis  -GI bleed Anemia of chronic disease Status post gastroenterology evaluation Status post endoscopy On IV PPI  -Hypotension On IV pressor medication  -COPD stable  -Type 2 diabetes mellitus Sliding scale coverage with insulin  -DVT prophylaxis Sequential compression device to lower extremities   All the records are reviewed and case discussed with Care Management/Social Worker. Management plans discussed with the patient, family and they are in agreement.  CODE STATUS: Full Code  DVT Prophylaxis: SCDs  TOTAL TIME TAKING CARE OF THIS PATIENT: 35 minutes.   POSSIBLE D/C IN 3 to 5 DAYS, DEPENDING ON CLINICAL CONDITION.  Saundra Shelling M.D on 06/01/2017 at 4:00 PM  Between 7am to 6pm - Pager - [phone]  After 6pm go to [email or web address] -  password [email or web address] [facility]  Office  [phone]  CC: Primary care physician; Ria Bush, MD  Note: This dictation was prepared with Dragon dictation along with smaller phrase technology.  Any transcriptional errors that result from this process are unintentional.

## 2017-06-01 NOTE — H&P (Signed)
Michael Bellows, MD [address], [place], [place], [place], [address] [id number] [place], [place], [place], [address] Phone: [phone]  Fax: [phone]  Primary Care Physician:  Ria Bush, MD   Pre-Procedure History & Physical: HPI:  [name] is a 84 y.o. male is here for an endoscopy    Past Medical History:  Diagnosis Date  . Arthritis   . Atrial fibrillation ([facility]) 06/23/2010  . B12 deficiency   . C. difficile colitis 07/06/2011  . CHF (congestive heart failure) ([facility])    diastolic. Echo 5/10 with mild LVH, EF 67%, grade 1 diastolic dysfunction, severe left atrial enlargement, aortic sclerosis  . Chronic a-fib ([facility])    on pradaxa, declined cardioversion in past  . COPD (chronic obstructive pulmonary disease) ([facility])    (Dr. Raul Del)  . Cryptogenic cirrhosis ([facility])    Followed by Dr Fuller Plan, never biopsied, has been stable.  History of gastric varices.  . Diabetes mellitus, type 2 ([facility])   . Gastric and duodenal angiodysplasia 06/28/2011  . Gastric varices   . GI bleed   . Hemorrhoids   . History of bladder cancer 1984   s/p ileal conduit  . History of ileostomy   . History of pneumonia 2012  . Hyperlipidemia   . Hypertension   . Inguinal hernia    left  . Iron deficiency anemia   . Kidney atrophy 02/2016  . Pinched nerve    back  . Pyloric stenosis    mild EGD 10/26/06  . RLL pneumonia ([facility]) 01/28/2014  . Shortness of breath     Past Surgical History:  Procedure Laterality Date  . BASAL CELL CARCINOMA EXCISION  2019   multiple sites  . BE  08/08/2003  . BLADDER REMOVAL     s/p urostomy bag  . COLONOSCOPY  07/24/2003   Int. hemorrhoids  . COLONOSCOPY  10/26/2006   Int hemorrhoids, 10 yrs  . ESOPHAGOGASTRODUODENOSCOPY  10/26/2006   Gastric varices, pyloric stenosis  . ESOPHAGOGASTRODUODENOSCOPY  07/01/2011   Procedure: ESOPHAGOGASTRODUODENOSCOPY (EGD);  Surgeon: Jerene Bears, MD;  Location: [facility];  Service: Gastroenterology;  Laterality:  N/A;  . ESOPHAGOGASTRODUODENOSCOPY  06/28/2011   Procedure: ESOPHAGOGASTRODUODENOSCOPY (EGD);  Surgeon: Jerene Bears, MD;  Location: [facility];  Service: Gastroenterology;  Laterality: N/A;  . FETAL BLOOD TRANSFUSION    . ILEOSTOMY  1984   bladder cancer  . PENILE PROSTHESIS IMPLANT  1990   inflatable implant  . PROSTATECTOMY  1984   bladder cancer  . US ECHOCARDIOGRAPHY  05/1995   EF 60% TR, AI    Prior to Admission medications   Medication Sig Start Date End Date Taking? Authorizing Provider  acetaminophen (TYLENOL) 325 MG tablet Take 325 mg by mouth every 6 (six) hours as needed for mild pain or fever.    Yes [provider]  albuterol (PROVENTIL HFA;VENTOLIN HFA) 108 (90 Base) MCG/ACT inhaler Inhale 2 puffs into the lungs every 6 (six) hours as needed for wheezing.   Yes [provider]  cloNIDine (CATAPRES) 0.2 MG tablet TAKE 1 TABLET BY MOUTH TWICE A DAY 05/30/17  Yes Ria Bush, MD  doxazosin (CARDURA) 8 MG tablet TAKE 1/2 TABLET BY MOUTH TWICE A DAY 03/14/17  Yes Ria Bush, MD  ferrous sulfate (FERRO-BOB) 325 (65 FE) MG tablet Take 1 tablet (325 mg total) by mouth daily with breakfast. 07/06/11  Yes Rizwan, Eunice Blase, MD  glucose blood (ONE TOUCH ULTRA TEST) test strip Use to test sugar  once daily and as needed Dx: E11.8 03/15/17  Yes Ria Bush, MD  losartan (COZAAR) 100 MG tablet TAKE 1 TABLET BY MOUTH ONCE DAILY 05/19/17  Yes Ria Bush, MD  magnesium oxide (MAG-OX) 400 MG tablet Take 400 mg by mouth daily.   Yes [provider]  nadolol (CORGARD) 40 MG tablet TAKE 1 TABLET BY MOUTH ONCE DAILY 03/14/17  Yes Ria Bush, MD  ONE TOUCH LANCETS MISC One daily and as needed/ ICD code 250.00    Yes [provider]  pantoprazole (PROTONIX) 40 MG tablet TAKE 1 TABLET BY MOUTH ONCE DAILY 05/09/17  Yes Ria Bush, MD  simvastatin (ZOCOR) 20 MG tablet TAKE 1 TABLET BY MOUTH EVERY DAY Patient taking differently: TAKE 1  TABLET BY MOUTH EVERY NIGHT 03/25/17  Yes Ria Bush, MD  tiotropium (SPIRIVA) 18 MCG inhalation capsule Place 18 mcg into inhaler and inhale daily.   Yes [provider]  vitamin B-12 (CYANOCOBALAMIN) 250 MCG tablet Take 250 mcg by mouth daily.   Yes [provider]  ALPRAZolam Duanne Moron) 0.5 MG tablet Take 0.5 mg by mouth at bedtime as needed for anxiety (For Laser procedure).    [provider]  Celedonio Miyamoto 62.5-25 MCG/INH AEPB  02/25/16   [provider]  furosemide (LASIX) 20 MG tablet TAKE 2 TABLETS BY MOUTH EVERY MORNING AND 1 TABLET IN THE AFTERNOON AS DIRECTED Patient not taking: Reported on 05/31/2017 03/29/17   Ria Bush, MD  triamcinolone cream (KENALOG) 0.1 % Apply 1 application topically 2 (two) times daily. Apply to AA. Patient not taking: Reported on 05/31/2017 09/09/14   Ria Bush, MD    Allergies as of 05/30/2017  . (No Known Allergies)    Family History  Problem Relation Age of Onset  . Hypertension Mother   . Osteoarthritis Mother   . Heart disease Father        MI  . Coronary artery disease Sister   . Diabetes Brother   . Heart disease Brother        MI  . Coronary artery disease Sister   . Stroke Other        GPs  . Colon cancer Neg Hx     Social History   Socioeconomic History  . Marital status: Married    Spouse name: Not on file  . Number of children: 7  . Years of education: Not on file  . Highest education level: Not on file  Occupational History  . Occupation: trucking, retired  Scientific laboratory technician  . Financial resource strain: Not on file  . Food insecurity:    Worry: Not on file    Inability: Not on file  . Transportation needs:    Medical: Not on file    Non-medical: Not on file  Tobacco Use  . Smoking status: Former Smoker    Packs/day: 1.00    Years: 45.00    Pack years: 45.00    Types: Cigarettes    Last attempt to quit: 06/22/1989    Years since quitting: 27.9  . Smokeless tobacco:  Never Used  . Tobacco comment: Quit 1990  Substance and Sexual Activity  . Alcohol use: No    Alcohol/week: 0.6 oz    Types: 1 Standard drinks or equivalent per week  . Drug use: No  . Sexual activity: Not Currently  Lifestyle  . Physical activity:    Days per week: Not on file    Minutes per session: Not on file  . Stress: Not  on file  Relationships  . Social connections:    Talks on phone: Not on file    Gets together: Not on file    Attends religious service: Not on file    Active member of club or organization: Not on file    Attends meetings of clubs or organizations: Not on file    Relationship status: Not on file  . Intimate partner violence:    Fear of current or ex partner: Not on file    Emotionally abused: Not on file    Physically abused: Not on file    Forced sexual activity: Not on file  Other Topics Concern  . Not on file  Social History Narrative   Daily caffeine use - 1-2 cups/day coffee   Lives with wife.     Served 2 yrs in [employer]   7 children   Occ: retired, was Administrator.   Activity: no regular exercise   Diet: good water, fruits/vegetables daily    Review of Systems: See HPI, otherwise negative ROS  Physical Exam: BP (!) 117/46   Pulse 60   Temp (!) 97 F (36.1 C) (Tympanic)   Resp 18   Ht 5\' 8"  (1.727 m)   Wt 246 lb (111.6 kg)   SpO2 90%   BMI 37.40 kg/m  General:   Alert,  pleasant and cooperative in NAD Head:  Normocephalic and atraumatic. Neck:  Supple; no masses or thyromegaly.Central line seen  Lungs:  Clear throughout to auscultation, normal respiratory effort.    Heart:  +S1, +S2, Regular rate and rhythm, No edema. Abdomen:  Soft, nontender and nondistended. Normal bowel sounds, without guarding, and without rebound.   Neurologic:  Alert and  oriented x4;  grossly normal neurologically.  Impression/Plan: [name] is here for an endoscopy  to be performed for  evaluation of GI bleed     Risks, benefits,  limitations, and alternatives regarding endoscopy have been reviewed with the patient.  Questions have been answered.  All parties agreeable.   Michael Bellows, MD  06/01/2017, 2:45 PM

## 2017-06-01 NOTE — Progress Notes (Signed)
[facility]  ROUNDING NOTE   Subjective:  Patient was started on CRRT yesterday. Has tolerated this quite well. Hyperkalemia improved.   Objective:  Vital signs in last 24 hours:  Temp:  [97.6 F (36.4 C)-98 F (36.7 C)] 98 F (36.7 C) (05/08 0800) Pulse Rate:  [46-68] 68 (05/08 0830) Resp:  [12-24] 13 (05/08 0830) BP: (67-123)/(33-61) 101/61 (05/08 0830) SpO2:  [90 %-97 %] 91 % (05/08 0830) Weight:  [111.7 kg (246 lb 4.1 oz)] 111.7 kg (246 lb 4.1 oz) (05/08 0500)  Weight change: 7.977 kg (17 lb 9.4 oz) Filed Weights   05/31/17 0052 05/31/17 0949 06/01/17 0500  Weight: 97.5 kg (215 lb) 105.5 kg (232 lb 9.4 oz) 111.7 kg (246 lb 4.1 oz)    Intake/Output: I/O last 3 completed shifts: In: 2768.1 [P.O.:120; I.V.:1898.1; Blood:750] Out: 285 [Urine:285]   Intake/Output this shift:  Total I/O In: -  Out: 250 [Urine:250]  Physical Exam: General: No acute distress  Head: Normocephalic, atraumatic. Moist oral mucosal membranes  Eyes: Anicteric  Neck: Supple, trachea midline  Lungs:  Clear to auscultation, normal effort  Heart: S1S2 no rubs  Abdomen:  Soft, nontender, bowel sounds present  Extremities: 1+ peripheral edema.  Neurologic: Awake, alert, following commands  Skin: No lesions  Access: Left IJ temporary dialysis catheter    Basic Metabolic Panel: Recent Labs  Lab 05/31/17 1533 05/31/17 1807  05/31/17 2046 05/31/17 2145 05/31/17 2315 06/01/17 0317 06/01/17 0602  NA 134* 134*   < > 134* 135 133* 136 135  K 6.5* 6.4*   < > 5.8* 5.8* 5.6* 5.2* 5.1  CL 107 107   < > 107 108 105 107 105  CO2 22 23   < > 23 24 24 25 24   GLUCOSE 136* 115*   < > 115* 116* 108* 102* 102*  BUN 56* 51*   < > 43* 41* 42* 34* 35*  CREATININE 3.60* 3.28*   < > 2.81* 2.80* 2.73* 2.26* 2.32*  CALCIUM 7.6* 7.6*   < > 7.6* 7.5* 7.5* 7.5* 7.7*  MG 2.6* 2.7*  --   --  2.2  --  2.1 2.1  PHOS 4.7* 4.6   < > 4.1 4.0 4.0 3.4 3.7   < > = values in this interval not displayed.     Liver Function Tests: Recent Labs  Lab 05/31/17 0008  05/31/17 2046 05/31/17 2145 05/31/17 2315 06/01/17 0317 06/01/17 0602  AST 45*  --   --   --   --   --   --   ALT 16*  --   --   --   --   --   --   ALKPHOS 110  --   --   --   --   --   --   BILITOT 1.0  --   --   --   --   --   --   PROT 5.8*  --   --   --   --   --   --   ALBUMIN 2.5*   < > 2.4* 2.4* 2.4* 2.4* 2.4*   < > = values in this interval not displayed.   No results for input(s): LIPASE, AMYLASE in the last 168 hours. No results for input(s): AMMONIA in the last 168 hours.  CBC: Recent Labs  Lab 05/31/17 0008 05/31/17 2145  WBC 4.4 4.8  NEUTROABS 3.4  --   HGB 8.1* 8.9*  HCT 25.1* 27.1*  MCV  97.9 96.1  PLT 121* 125*    Cardiac Enzymes: Recent Labs  Lab 05/31/17 0008 05/31/17 1026  CKTOTAL  --  99  TROPONINI <0.03  --     BNP: Invalid input(s): POCBNP  CBG: Recent Labs  Lab 05/31/17 0938 05/31/17 1322 05/31/17 1612 05/31/17 2107 06/01/17 0744  GLUCAP 94 104* 114* 112* 92    Microbiology: Results for orders placed or performed during the hospital encounter of 05/30/17  Culture, blood (routine x 2)     Status: None (Preliminary result)   Collection Time: 05/31/17 12:05 AM  Result Value Ref Range Status   Specimen Description BLOOD RIGHT HAND  Final   Special Requests   Final    BOTTLES DRAWN AEROBIC AND ANAEROBIC Blood Culture adequate volume   Culture   Final    NO GROWTH 1 DAY Performed at [facility], [address] [place], [place] [address]    Report Status PENDING  Incomplete  Urine culture     Status: Abnormal   Collection Time: 05/31/17 12:05 AM  Result Value Ref Range Status   Specimen Description   Final    URINE, RANDOM Performed at [facility], [address] [place], [place] [address]    Special Requests   Final    NONE Performed at [facility], [address] [place], [place] [address]    Culture MULTIPLE SPECIES PRESENT,  SUGGEST RECOLLECTION (A)  Final   Report Status 06/01/2017 FINAL  Final  Culture, blood (routine x 2)     Status: None (Preliminary result)   Collection Time: 05/31/17 12:08 AM  Result Value Ref Range Status   Specimen Description BLOOD LEFT AC  Final   Special Requests   Final    BOTTLES DRAWN AEROBIC AND ANAEROBIC Blood Culture adequate volume   Culture   Final    NO GROWTH 1 DAY Performed at [facility], [address], [place], [place] [address]    Report Status PENDING  Incomplete  MRSA PCR Screening     Status: None   Collection Time: 05/31/17  9:47 AM  Result Value Ref Range Status   MRSA by PCR NEGATIVE NEGATIVE Final    Comment:        The GeneXpert MRSA Assay (FDA approved for NASAL specimens only), is one component of a comprehensive MRSA colonization surveillance program. It is not intended to diagnose MRSA infection nor to guide or monitor treatment for MRSA infections. Performed at [facility], [address] [place], [place] [address]     Coagulation Studies: Recent Labs    05/31/17 0011  LABPROT 17.4*  INR 1.44    Urinalysis: Recent Labs    05/31/17 0005  COLORURINE AMBER*  LABSPEC 1.009  PHURINE 7.0  GLUCOSEU NEGATIVE  HGBUR NEGATIVE  BILIRUBINUR NEGATIVE  KETONESUR 5*  PROTEINUR 30*  NITRITE NEGATIVE  LEUKOCYTESUR MODERATE*      Imaging: Ct Head Wo Contrast  Result Date: 05/31/2017 CLINICAL DATA:  Altered level of consciousness, patient fell out of bed and is hypotensive with bradycardia. EXAM: CT HEAD WITHOUT CONTRAST TECHNIQUE: Contiguous axial images were obtained from the base of the skull through the vertex without intravenous contrast. COMPARISON:  None. FINDINGS: Brain: Remote appearing right thalamic lacunar infarct. Age related involutional changes of the brain with mild-to-moderate small vessel ischemic disease. No large vascular territory infarction, hemorrhage or midline shift. No intra-axial mass nor  extra-axial fluid collections. Vascular: No hyperdense vessel sign. Skull: Intact Sinuses/Orbits: Complete  opacification of the included maxillary sinuses with desiccated mucus seen within. Moderate ethmoid sinus mucosal thickening more so anteriorly. The sphenoid sinus is clear. There is right-sided frontal sinus mucosal opacification. Intact orbits and globes. Other: None IMPRESSION: Paranasal sinus mucosal thickening in opacification as above. Remote appearing small vessel ischemic disease and right thalamic lacunar infarct. No acute intracranial abnormality. Electronically Signed   By: Ashley Royalty M.D.   On: 05/31/2017 01:47   US Renal  Result Date: 05/31/2017 CLINICAL DATA:  84 year old male with renal failure. Bladder cancer post cystectomy. Initial encounter. EXAM: RENAL / URINARY TRACT ULTRASOUND COMPLETE COMPARISON:  04/01/2016 MR. FINDINGS: Right Kidney: Length: 10.6 cm. Thin renal cortex. No hydronephrosis. Upper pole 4.7 x 4.8 x 5.5 cm cyst. Left Kidney: Length: 10.1 cm. Thin renal parenchyma. No hydronephrosis or mass. Bladder: Surgically removed. Ascites. Heterogeneous liver with lobular contour consistent with history of cirrhosis. Splenomegaly. IMPRESSION: Bilateral thin renal parenchyma. No hydronephrosis. 5.5 cm right renal cyst. Urinary bladder surgically removed. Ascites around the liver and spleen. Heterogeneous liver with lobular contour consistent with history of cirrhosis. Splenomegaly. Liver and spleen not completely assessed. Electronically Signed   By: Genia Del M.D.   On: 05/31/2017 12:31   Dg Chest Port 1 View  Result Date: 05/31/2017 CLINICAL DATA:  Status post central line placement EXAM: PORTABLE CHEST 1 VIEW COMPARISON:  05/30/2017 FINDINGS: Cardiac shadow is stable. New left jugular temporary dialysis catheter is seen in satisfactory position. No pneumothorax is noted. Patchy atelectatic changes are noted throughout both lungs. No focal confluent infiltrate is seen.  IMPRESSION: No pneumothorax following central line placement. Electronically Signed   By: Inez Catalina M.D.   On: 05/31/2017 13:13   Dg Chest Port 1 View  Result Date: 05/31/2017 CLINICAL DATA:  Weakness and declining health. EXAM: PORTABLE CHEST 1 VIEW COMPARISON:  None. FINDINGS: Cardiomegaly with moderate aortic atherosclerosis. Pulmonary vascular redistribution with small right effusion compatible with CHF. No acute osseous abnormality. IMPRESSION: Cardiomegaly with mild CHF and small right effusion. Electronically Signed   By: Ashley Royalty M.D.   On: 05/31/2017 00:29     Medications:   . sodium chloride    . sodium chloride 75 mL/hr at 06/01/17 0513  . cefTRIAXone (ROCEPHIN)  IV 1 g (06/01/17 0945)  . octreotide  (SANDOSTATIN)    IV infusion 50 mcg/hr (05/31/17 2100)  . pantoprozole (PROTONIX) infusion 8 mg/hr (06/01/17 0844)  . phenylephrine (NEO-SYNEPHRINE) Adult infusion 60 mcg/min (06/01/17 0844)   . cloNIDine  0.2 mg Oral BID  . docusate sodium  100 mg Oral BID  . doxazosin  4 mg Oral BID  . ferrous sulfate  325 mg Oral Q breakfast  . insulin aspart  0-5 Units Subcutaneous QHS  . insulin aspart  0-9 Units Subcutaneous TID WC  . lidocaine (PF)  30 mL Intradermal STAT  . magnesium oxide  400 mg Oral Daily  . nystatin   Topical TID  . simvastatin  20 mg Oral QHS  . tiotropium  18 mcg Inhalation Daily  . umeclidinium-vilanterol  1 puff Inhalation Daily  . vitamin B-12  250 mcg Oral Daily   acetaminophen **OR** acetaminophen, albuterol, heparin, morphine injection, ondansetron **OR** ondansetron (ZOFRAN) IV  Assessment/ Plan:  84 y.o. male with a PMHx of osteoarthritis, atrial fibrillation, congestive heart failure, chronic atrial for ablation, COPD, cryptogenic cirrhosis, diabetes mellitus type 2, gastric varices, history of bladder cancer status post ileal conduit, hypertension, hyperlipidemia,  who was admitted to [facility] on 05/30/2017 for  evaluation of generalized weakness.  1.   Acute renal failure, possibly due to reduced PO intake, may have also been on diuretics. 2.  Chronic kidney disease stage III baseline creatinine 1.8. 3.  Severe hyperkalemia potassium, improved, K down to 5.1 4.  Generalized weakness, likely secondary to hyperkalemia. 5.  Anemia of chronic kidney disease hemoglobin 8.9  Plan: Patient is done well with continuous renal replacement therapy.  Serum potassium down to 5.1.  Therefore we will discontinue CRRT and transition him to intermittent hemodialysis today.  Orders for this have been prepared.  We will need to continue to monitor urine output closely.  Awaiting additional serologic work-up.    LOS: 1 Katheline Brendlinger 5/8/201910:09 AM

## 2017-06-01 NOTE — Progress Notes (Signed)
CRRT stopped per Dr. Elwyn Lade orders. No issues with rinseback. Dialysis catheter heparin locked per orders.

## 2017-06-01 NOTE — Op Note (Signed)
[facility] Gastroenterology Patient Name: [name] Procedure Date: 06/01/2017 2:44 PM MRN: [id number] Account #: [id number] Date of Birth: 03-16-1933 Admit Type: Inpatient Age: 84 Room: [facility] ENDO ROOM 1 Gender: Male Note Status: Finalized Procedure:            Upper GI endoscopy Indications:          Melena Providers:            [name] MD, MD Referring MD:         Ria Bush (Referring MD) Medicines:            Monitored Anesthesia Care Complications:        No immediate complications. Procedure:            Pre-Anesthesia Assessment:                       - Prior to the procedure, a History and Physical was                        performed, and patient medications, allergies and                        sensitivities were reviewed. The patient's tolerance of                        previous anesthesia was reviewed.                       - The risks and benefits of the procedure and the                        sedation options and risks were discussed with the                        patient. All questions were answered and informed                        consent was obtained.                       - ASA Grade Assessment: IV - A patient with severe                        systemic disease that is a constant threat to life.                       After obtaining informed consent, the endoscope was                        passed under direct vision. Throughout the procedure,                        the patient's blood pressure, pulse, and oxygen                        saturations were monitored continuously. The Endoscope                        was introduced through the mouth, and advanced to the  third part of duodenum. The upper GI endoscopy was                        accomplished with ease. The patient tolerated the                        procedure well. Findings:      The esophagus was normal.      The examined duodenum was normal.    Diffuse severe inflammation with hemorrhage characterized by adherent       blood, congestion (edema), erosions, erythema and friability was found       on the lesser curvature of the stomach.      Ischemic appearance. No active bleeding. No gastric varices.      The cardia and gastric fundus were normal on retroflexion. Impression:           - Normal esophagus.                       - Normal examined duodenum.                       - Gastritis with hemorrhage.                       - No specimens collected. Recommendation:       - Return patient to hospital ward for ongoing care.                       - 1. IV PPI BID                       2. Likely cause for ischemic gastitis is hypovolemia                        and hypoperfusion.                       3. Should improve as his hemodynamic status improves                       4. Stool H pylori antigen                       5. Can start on clears. Procedure Code(s):    --- Professional ---                       [phone], Esophagogastroduodenoscopy, flexible, transoral;                        diagnostic, including collection of specimen(s) by                        brushing or washing, when performed (separate procedure) Diagnosis Code(s):    --- Professional ---                       K29.71, Gastritis, unspecified, with bleeding                       K92.1, Melena (includes Hematochezia) CPT copyright 2017 American [employer] Association. All rights reserved. The codes documented in this report are preliminary and upon coder review may  be revised to meet current compliance requirements. [name], MD [name] MD, MD 06/01/2017 3:01:59 PM This report has been signed electronically. Number of Addenda: 0 Note Initiated On: 06/01/2017 2:44 PM      [facility]

## 2017-06-01 NOTE — Progress Notes (Signed)
Follow up - Critical Care Medicine Note  Patient Details:    [name] is an 84 y.o. male.with acute renal failure and hyperkalemia requiring emergent hemodialysis  Lines, Airways, Drains: Urostomy (Active)  Ostomy Pouch 2 piece 06/01/2017 12:01 AM  Stoma Assessment Pink 06/01/2017 12:01 AM  Output (mL) 35 mL 05/31/2017  5:41 PM    Anti-infectives:  Anti-infectives (From admission, onward)   Start     Dose/Rate Route Frequency Ordered Stop   05/31/17 1200  cefTRIAXone (ROCEPHIN) 1 g in sodium chloride 0.9 % 100 mL IVPB     1 g 200 mL/hr over 30 Minutes Intravenous Daily 05/31/17 1156        Microbiology: Results for orders placed or performed during the hospital encounter of 05/30/17  Culture, blood (routine x 2)     Status: None (Preliminary result)   Collection Time: 05/31/17 12:05 AM  Result Value Ref Range Status   Specimen Description BLOOD RIGHT HAND  Final   Special Requests   Final    BOTTLES DRAWN AEROBIC AND ANAEROBIC Blood Culture adequate volume   Culture   Final    NO GROWTH 1 DAY Performed at [facility], [address], [place], [place] [address]    Report Status PENDING  Incomplete  Urine culture     Status: Abnormal   Collection Time: 05/31/17 12:05 AM  Result Value Ref Range Status   Specimen Description   Final    URINE, RANDOM Performed at [facility], [address] [place], [place] [address]    Special Requests   Final    NONE Performed at [facility], [address], [place], [place] [address]    Culture MULTIPLE SPECIES PRESENT, SUGGEST RECOLLECTION (A)  Final   Report Status 06/01/2017 FINAL  Final  Culture, blood (routine x 2)     Status: None (Preliminary result)   Collection Time: 05/31/17 12:08 AM  Result Value Ref Range Status   Specimen Description BLOOD LEFT AC  Final   Special Requests   Final    BOTTLES DRAWN AEROBIC AND ANAEROBIC Blood Culture adequate volume   Culture   Final    NO GROWTH 1  DAY Performed at [facility], [address], [place], [place] [address]    Report Status PENDING  Incomplete  MRSA PCR Screening     Status: None   Collection Time: 05/31/17  9:47 AM  Result Value Ref Range Status   MRSA by PCR NEGATIVE NEGATIVE Final    Comment:        The GeneXpert MRSA Assay (FDA approved for NASAL specimens only), is one component of a comprehensive MRSA colonization surveillance program. It is not intended to diagnose MRSA infection nor to guide or monitor treatment for MRSA infections. Performed at [facility], [place], [place] [address]   Events:   Studies: Ct Head Wo Contrast  Result Date: 05/31/2017 CLINICAL DATA:  Altered level of consciousness, patient fell out of bed and is hypotensive with bradycardia. EXAM: CT HEAD WITHOUT CONTRAST TECHNIQUE: Contiguous axial images were obtained from the base of the skull through the vertex without intravenous contrast. COMPARISON:  None. FINDINGS: Brain: Remote appearing right thalamic lacunar infarct. Age related involutional changes of the brain with mild-to-moderate small vessel ischemic disease. No large vascular territory infarction, hemorrhage or midline shift. No intra-axial mass nor extra-axial fluid collections. Vascular: No hyperdense vessel sign. Skull: Intact Sinuses/Orbits: Complete opacification of the included maxillary sinuses  with desiccated mucus seen within. Moderate ethmoid sinus mucosal thickening more so anteriorly. The sphenoid sinus is clear. There is right-sided frontal sinus mucosal opacification. Intact orbits and globes. Other: None IMPRESSION: Paranasal sinus mucosal thickening in opacification as above. Remote appearing small vessel ischemic disease and right thalamic lacunar infarct. No acute intracranial abnormality. Electronically Signed   By: Ashley Royalty M.D.   On: 05/31/2017 01:47   US Renal  Result Date: 05/31/2017 CLINICAL DATA:  84 year old male  with renal failure. Bladder cancer post cystectomy. Initial encounter. EXAM: RENAL / URINARY TRACT ULTRASOUND COMPLETE COMPARISON:  04/01/2016 MR. FINDINGS: Right Kidney: Length: 10.6 cm. Thin renal cortex. No hydronephrosis. Upper pole 4.7 x 4.8 x 5.5 cm cyst. Left Kidney: Length: 10.1 cm. Thin renal parenchyma. No hydronephrosis or mass. Bladder: Surgically removed. Ascites. Heterogeneous liver with lobular contour consistent with history of cirrhosis. Splenomegaly. IMPRESSION: Bilateral thin renal parenchyma. No hydronephrosis. 5.5 cm right renal cyst. Urinary bladder surgically removed. Ascites around the liver and spleen. Heterogeneous liver with lobular contour consistent with history of cirrhosis. Splenomegaly. Liver and spleen not completely assessed. Electronically Signed   By: Genia Del M.D.   On: 05/31/2017 12:31   Dg Chest Port 1 View  Result Date: 05/31/2017 CLINICAL DATA:  Status post central line placement EXAM: PORTABLE CHEST 1 VIEW COMPARISON:  05/30/2017 FINDINGS: Cardiac shadow is stable. New left jugular temporary dialysis catheter is seen in satisfactory position. No pneumothorax is noted. Patchy atelectatic changes are noted throughout both lungs. No focal confluent infiltrate is seen. IMPRESSION: No pneumothorax following central line placement. Electronically Signed   By: Inez Catalina M.D.   On: 05/31/2017 13:13   Dg Chest Port 1 View  Result Date: 05/31/2017 CLINICAL DATA:  Weakness and declining health. EXAM: PORTABLE CHEST 1 VIEW COMPARISON:  None. FINDINGS: Cardiomegaly with moderate aortic atherosclerosis. Pulmonary vascular redistribution with small right effusion compatible with CHF. No acute osseous abnormality. IMPRESSION: Cardiomegaly with mild CHF and small right effusion. Electronically Signed   By: Ashley Royalty M.D.   On: 05/31/2017 00:29    Consults: Treatment Team:  Anthonette Legato, MD [name], MD   Subjective:    Overnight Issues: patient status post  vascular access and continuous renal replacement therapy. Only complaints is chronic left shoulder pain presently on octreotide and Rocephin  Objective:  Vital signs for last 24 hours: Temp:  [97.6 F (36.4 C)-98 F (36.7 C)] 98 F (36.7 C) (05/08 0800) Pulse Rate:  [46-64] 64 (05/08 0800) Resp:  [12-24] 17 (05/08 0800) BP: (67-123)/(33-49) 120/45 (05/08 0800) SpO2:  [90 %-99 %] 93 % (05/08 0800) Weight:  [232 lb 9.4 oz (105.5 kg)-246 lb 4.1 oz (111.7 kg)] 246 lb 4.1 oz (111.7 kg) (05/08 0500)  Hemodynamic parameters for last 24 hours:    Intake/Output from previous day: 05/07 0701 - 05/08 0700 In: 2768.1 [P.O.:120; I.V.:1898.1; Blood:750] Out: 285 [Urine:285]  Intake/Output this shift: No intake/output data recorded.  Vent settings for last 24 hours:    Physical Exam:  Patient is awake, alert, oriented in no acute distress. Vital signs: Please see above list vital signs Cardiovascular: Regular rate and rhythm Pulmonary: Clear to auscultation Abdominal: Positive bowel sounds, soft exam Extremities: No clubbing or cyanosis Neurologic: No focal deficits appreciated  Assessment/Plan:   K anemia. Repeat potassium is 5.1 on CRRT. Acute renal failure, workup instituted impending. BUNs 35 over creatinine 2.32  Anemia. Prior history of cirrhosis, being seen by gastroenterology, on octreotide and prophylactic Rocephin  Hx  of CHF. Pending ECHO.   Atrial Fibrillation. Bradycardic some improvement with pulse in the 60s    Critical Care Total Time 35 minutes  Taelyn Broecker 06/01/2017  *Care during the described time interval was provided by me and/or other providers on the critical care team.  I have reviewed this patient's available data, including medical history, events of note, physical examination and test results as part of my evaluation.

## 2017-06-01 NOTE — Anesthesia Procedure Notes (Signed)
Date/Time: 06/01/2017 3:11 PM Performed by: Nelda Marseille, CRNA Pre-anesthesia Checklist: Patient identified, Emergency Drugs available, Suction available, Patient being monitored and Timeout performed Oxygen Delivery Method: Nasal cannula

## 2017-06-01 NOTE — Anesthesia Preprocedure Evaluation (Signed)
Anesthesia Evaluation  Patient identified by MRN, date of birth, ID band Patient awake    Reviewed: Allergy & Precautions, H&P , NPO status , Patient's Chart, lab work & pertinent test results, reviewed documented beta blocker date and time   Airway Mallampati: II   Neck ROM: full    Dental  (+) Poor Dentition   Pulmonary neg pulmonary ROS, shortness of breath and with exertion, pneumonia, resolved, COPD,  COPD inhaler and oxygen dependent, former smoker,    Pulmonary exam normal        Cardiovascular Exercise Tolerance: Poor hypertension, On Medications + Peripheral Vascular Disease and +CHF  negative cardio ROS Normal cardiovascular exam Rhythm:regular Rate:Normal     Neuro/Psych negative neurological ROS  negative psych ROS   GI/Hepatic negative GI ROS, Neg liver ROS,   Endo/Other  negative endocrine ROSdiabetes  Renal/GU CRF and ESRFRenal diseasenegative Renal ROS  negative genitourinary   Musculoskeletal   Abdominal   Peds  Hematology negative hematology ROS (+) anemia ,   Anesthesia Other Findings Past Medical History: No date: Arthritis 06/23/2010: Atrial fibrillation ([place]) No date: B12 deficiency 07/06/2011: C. difficile colitis No date: CHF (congestive heart failure) ([place])     Comment:  diastolic. Echo 5/10 with mild LVH, EF 75%, grade 1               diastolic dysfunction, severe left atrial enlargement,               aortic sclerosis No date: Chronic a-fib (HCC)     Comment:  on pradaxa, declined cardioversion in past No date: COPD (chronic obstructive pulmonary disease) (H[facility])     Comment:  (Dr. Raul Del) No date: Cryptogenic cirrhosis ([place])     Comment:  Followed by Dr Fuller Plan, never biopsied, has been stable.                History of gastric varices. No date: Diabetes mellitus, type 2 ([place]) 06/28/2011: Gastric and duodenal angiodysplasia No date: Gastric varices No date: GI bleed No date:  Hemorrhoids 1984: History of bladder cancer     Comment:  s/p ileal conduit No date: History of ileostomy 2012: History of pneumonia No date: Hyperlipidemia No date: Hypertension No date: Inguinal hernia     Comment:  left No date: Iron deficiency anemia 02/2016: Kidney atrophy No date: Pinched nerve     Comment:  back No date: Pyloric stenosis     Comment:  mild EGD 10/26/06 01/28/2014: RLL pneumonia ([place]) No date: Shortness of breath Past Surgical History: 2019: BASAL CELL CARCINOMA EXCISION     Comment:  multiple sites 08/08/2003: BE No date: BLADDER REMOVAL     Comment:  s/p urostomy bag 07/24/2003: COLONOSCOPY     Comment:  Int. hemorrhoids 10/26/2006: COLONOSCOPY     Comment:  Int hemorrhoids, 10 yrs 10/26/2006: ESOPHAGOGASTRODUODENOSCOPY     Comment:  Gastric varices, pyloric stenosis 07/01/2011: ESOPHAGOGASTRODUODENOSCOPY     Comment:  Procedure: ESOPHAGOGASTRODUODENOSCOPY (EGD);  Surgeon:               Jerene Bears, MD;  Location: [place];  Service:               Gastroenterology;  Laterality: N/A; 06/28/2011: ESOPHAGOGASTRODUODENOSCOPY     Comment:  Procedure: ESOPHAGOGASTRODUODENOSCOPY (EGD);  Surgeon:               Jerene Bears, MD;  Location: [facility];  Service:  Gastroenterology;  Laterality: N/A; No date: FETAL BLOOD TRANSFUSION 1984: ILEOSTOMY     Comment:  bladder cancer 1990: PENILE PROSTHESIS IMPLANT     Comment:  inflatable implant 1984: PROSTATECTOMY     Comment:  bladder cancer 05/1995: US ECHOCARDIOGRAPHY     Comment:  EF 60% TR, AI BMI    Body Mass Index:  37.40 kg/m     Reproductive/Obstetrics negative OB ROS                             Anesthesia Physical Anesthesia Plan  ASA: IV and emergent  Anesthesia Plan: General   Post-op Pain Management:    Induction:   PONV Risk Score and Plan:   Airway Management Planned:   Additional Equipment:   Intra-op Plan:   Post-operative Plan:    Informed Consent: I have reviewed the patients History and Physical, chart, labs and discussed the procedure including the risks, benefits and alternatives for the proposed anesthesia with the patient or authorized representative who has indicated his/her understanding and acceptance.   Dental Advisory Given  Plan Discussed with: CRNA  Anesthesia Plan Comments:         Anesthesia Quick Evaluation

## 2017-06-01 NOTE — Progress Notes (Signed)
HD Tx started    06/01/17 1530  Vital Signs  Pulse Rate 64  Pulse Rate Source Monitor  Resp 18  BP (!) 112/34  BP Location Right Arm  BP Method Automatic  Patient Position (if appropriate) Lying  Oxygen Therapy  SpO2 95 %  O2 Device Nasal Cannula  O2 Flow Rate (L/min) 4 L/min  Pulse Oximetry Type Continuous  During Hemodialysis Assessment  Blood Flow Rate (mL/min) 250 mL/min  Arterial Pressure (mmHg) -60 mmHg  Venous Pressure (mmHg) 70 mmHg  Transmembrane Pressure (mmHg) 50 mmHg  Ultrafiltration Rate (mL/min) 130 mL/min  Dialysate Flow Rate (mL/min) 500 ml/min  Conductivity: Machine  13.5  HD Safety Checks Performed Yes  Dialysis Fluid Bolus Normal Saline  Bolus Amount (mL) 250 mL  Intra-Hemodialysis Comments Tx initiated (Pt resting, stable )  Hemodialysis Catheter Left Internal jugular Triple-lumen  Placement Date/Time: 05/31/17 1200   Placed prior to admission: No  Time Out: Correct patient;Correct site;Correct procedure  Maximum sterile barrier precautions: Hand hygiene;Sterile gloves;Cap;Large sterile sheet;Mask;Sterile gown  Site Prep: Chlorh...  Site Condition No complications  Blue Lumen Status Flushed  Red Lumen Status Flushed  Purple Lumen Status Heparin locked  Dressing Status Clean;Dry;Intact  Dressing Change Due 06/07/17

## 2017-06-01 NOTE — Progress Notes (Signed)
HD Tx ended.   06/01/17 1830  Vital Signs  Pulse Rate 71  Pulse Rate Source Monitor  Resp 15  BP (!) 118/39  BP Location Right Arm  BP Method Automatic  Patient Position (if appropriate) Lying  Oxygen Therapy  SpO2 94 %  O2 Device Nasal Cannula  O2 Flow Rate (L/min) 4 L/min  Pulse Oximetry Type Continuous  During Hemodialysis Assessment  HD Safety Checks Performed Yes  Dialysis Fluid Bolus Normal Saline  Bolus Amount (mL) 250 mL  Intra-Hemodialysis Comments Tx completed;Tolerated well

## 2017-06-01 NOTE — Anesthesia Post-op Follow-up Note (Signed)
Anesthesia QCDR form completed.        

## 2017-06-02 ENCOUNTER — Encounter: Payer: Self-pay | Admitting: Gastroenterology

## 2017-06-02 ENCOUNTER — Inpatient Hospital Stay: Payer: PPO

## 2017-06-02 DIAGNOSIS — E872 Acidosis: Secondary | ICD-10-CM

## 2017-06-02 LAB — BASIC METABOLIC PANEL
ANION GAP: 4 — AB (ref 5–15)
BUN: 25 mg/dL — ABNORMAL HIGH (ref 6–20)
CALCIUM: 7.4 mg/dL — AB (ref 8.9–10.3)
CO2: 25 mmol/L (ref 22–32)
Chloride: 106 mmol/L (ref 101–111)
Creatinine, Ser: 2.36 mg/dL — ABNORMAL HIGH (ref 0.61–1.24)
GFR calc non Af Amer: 24 mL/min — ABNORMAL LOW (ref >60)
GFR, EST AFRICAN AMERICAN: 28 mL/min — AB (ref >60)
GLUCOSE: 92 mg/dL (ref 65–99)
POTASSIUM: 5 mmol/L (ref 3.5–5.1)
Sodium: 135 mmol/L (ref 135–145)

## 2017-06-02 LAB — CBC WITH DIFFERENTIAL/PLATELET
BASOS ABS: 0 10*3/uL (ref 0–0.1)
BASOS PCT: 1 %
Eosinophils Absolute: 0.1 10*3/uL (ref 0–0.7)
Eosinophils Relative: 1 %
HEMATOCRIT: 27.2 % — AB (ref 40.0–52.0)
HEMOGLOBIN: 9 g/dL — AB (ref 13.0–18.0)
LYMPHS PCT: 13 %
Lymphs Abs: 0.9 10*3/uL — ABNORMAL LOW (ref 1.0–3.6)
MCH: 32 pg (ref 26.0–34.0)
MCHC: 33.2 g/dL (ref 32.0–36.0)
MCV: 96.3 fL (ref 80.0–100.0)
MONO ABS: 1 10*3/uL (ref 0.2–1.0)
MONOS PCT: 15 %
NEUTROS ABS: 4.9 10*3/uL (ref 1.4–6.5)
NEUTROS PCT: 70 %
Platelets: 149 10*3/uL — ABNORMAL LOW (ref 150–440)
RBC: 2.83 MIL/uL — ABNORMAL LOW (ref 4.40–5.90)
RDW: 17.2 % — AB (ref 11.5–14.5)
WBC: 6.9 10*3/uL (ref 3.8–10.6)

## 2017-06-02 LAB — PROTEIN ELECTRO, RANDOM URINE
ALPHA-1-GLOBULIN, U: 6.9 %
ALPHA-2-GLOBULIN, U: 9 %
Albumin ELP, Urine: 48.7 %
Beta Globulin, U: 19.6 %
Gamma Globulin, U: 15.8 %
TOTAL PROTEIN, URINE-UPE24: 40.8 mg/dL

## 2017-06-02 LAB — GLUCOSE, CAPILLARY
Glucose-Capillary: 89 mg/dL (ref 65–99)
Glucose-Capillary: 88 mg/dL (ref 65–99)
Glucose-Capillary: 115 mg/dL — ABNORMAL HIGH (ref 65–99)
Glucose-Capillary: 102 mg/dL — ABNORMAL HIGH (ref 65–99)

## 2017-06-02 LAB — HEPATITIS B SURFACE ANTIGEN: Hepatitis B Surface Ag: NEGATIVE

## 2017-06-02 LAB — HEPATITIS B SURFACE ANTIBODY, QUANTITATIVE: Hepatitis B-Post: <3.1 m[IU]/mL — ABNORMAL LOW (ref >9.9)

## 2017-06-02 LAB — PROCALCITONIN: Procalcitonin: 0.29 ng/mL

## 2017-06-02 MED ORDER — VASOPRESSIN 20 UNIT/ML IV SOLN
0.0300 [IU]/min | INTRAVENOUS | Status: DC
  Administered 2017-06-02 (×2): 0.03 [IU]/min via INTRAVENOUS
  Filled 2017-06-02 (×3): qty 2

## 2017-06-02 MED ORDER — SODIUM CHLORIDE 0.9 % IV SOLN: 1.0000 g | INTRAVENOUS | Status: DC

## 2017-06-02 MED ORDER — SODIUM CHLORIDE 0.9 % IV SOLN
1.0000 g | Freq: Every day | INTRAVENOUS | Status: DC
  Administered 2017-06-02: 1 g via INTRAVENOUS
  Filled 2017-06-02 (×2): qty 10

## 2017-06-02 MED ORDER — PANTOPRAZOLE SODIUM 40 MG IV SOLR
40.0000 mg | Freq: Two times a day (BID) | INTRAVENOUS | Status: DC
  Administered 2017-06-02 – 2017-06-06 (×10): 40 mg via INTRAVENOUS
  Filled 2017-06-02 (×9): qty 40

## 2017-06-02 NOTE — Progress Notes (Signed)
[facility]  ROUNDING NOTE   Subjective:  Patient states that he is not feeling well at the moment. Currently on high flow nasal cannula. Urine output was 550 cc over the preceding 24 hours. Potassium has normalized at 5.0 and creatinine currently 2.36.   Objective:  Vital signs in last 24 hours:  Temp:  [97 F (36.1 C)-99.1 F (37.3 C)] 98.7 F (37.1 C) (05/09 0800) Pulse Rate:  [55-74] 60 (05/09 0700) Resp:  [10-24] 11 (05/09 0700) BP: (104-132)/(34-53) 129/43 (05/09 0700) SpO2:  [89 %-99 %] 98 % (05/09 0700) FiO2 (%):  [54 %-55 %] 55 % (05/09 0100) Weight:  [110.6 kg (243 lb 13.3 oz)-111.9 kg (246 lb 11.1 oz)] 110.6 kg (243 lb 13.3 oz) (05/09 0500)  Weight change: 6.085 kg (13 lb 6.6 oz) Filed Weights   06/01/17 1526 06/01/17 1833 06/02/17 0500  Weight: 111.8 kg (246 lb 7.6 oz) 111.9 kg (246 lb 11.1 oz) 110.6 kg (243 lb 13.3 oz)    Intake/Output: I/O last 3 completed shifts: In: 5358.5 [I.V.:5358.5] Out: 550 [Urine:550]   Intake/Output this shift:  Total I/O In: 611.6 [I.V.:611.6] Out: -   Physical Exam: General: No acute distress  Head: Normocephalic, atraumatic. High flow nasal canula on  Eyes: Anicteric  Neck: Supple, trachea midline  Lungs:  Scattered rhonchi, normal effort  Heart: S1S2 no rubs  Abdomen:  Soft, nontender, bowel sounds present  Extremities: Trace peripheral edema.  Neurologic: Awake, alert, following commands  Skin: No lesions  Access: Left IJ temporary dialysis catheter    Basic Metabolic Panel: Recent Labs  Lab 05/31/17 1533 05/31/17 1807  05/31/17 2145 05/31/17 2315 06/01/17 0317 06/01/17 0602 06/01/17 1700 06/02/17 0323  NA 134* 134*   < > 135 133* 136 135  --  135  K 6.5* 6.4*   < > 5.8* 5.6* 5.2* 5.1  --  5.0  CL 107 107   < > 108 105 107 105  --  106  CO2 22 23   < > 24 24 25 24   --  25  GLUCOSE 136* 115*   < > 116* 108* 102* 102*  --  92  BUN 56* 51*   < > 41* 42* 34* 35*  --  25*  CREATININE 3.60*  3.28*   < > 2.80* 2.73* 2.26* 2.32*  --  2.36*  CALCIUM 7.6* 7.6*   < > 7.5* 7.5* 7.5* 7.7*  --  7.4*  MG 2.6* 2.7*  --  2.2  --  2.1 2.1  --   --   PHOS 4.7* 4.6   < > 4.0 4.0 3.4 3.7 4.6  --    < > = values in this interval not displayed.    Liver Function Tests: Recent Labs  Lab 05/31/17 0008  05/31/17 2046 05/31/17 2145 05/31/17 2315 06/01/17 0317 06/01/17 0602  AST 45*  --   --   --   --   --   --   ALT 16*  --   --   --   --   --   --   ALKPHOS 110  --   --   --   --   --   --   BILITOT 1.0  --   --   --   --   --   --   PROT 5.8*  --   --   --   --   --   --   ALBUMIN 2.5*   < >  2.4* 2.4* 2.4* 2.4* 2.4*   < > = values in this interval not displayed.   No results for input(s): LIPASE, AMYLASE in the last 168 hours. No results for input(s): AMMONIA in the last 168 hours.  CBC: Recent Labs  Lab 05/31/17 0008 05/31/17 2145 06/02/17 0323  WBC 4.4 4.8 6.9  NEUTROABS 3.4  --  4.9  HGB 8.1* 8.9* 9.0*  HCT 25.1* 27.1* 27.2*  MCV 97.9 96.1 96.3  PLT 121* 125* 149*    Cardiac Enzymes: Recent Labs  Lab 05/31/17 0008 05/31/17 1026  CKTOTAL  --  99  TROPONINI <0.03  --     BNP: Invalid input(s): POCBNP  CBG: Recent Labs  Lab 06/01/17 0744 06/01/17 1150 06/01/17 1545 06/01/17 2114 06/02/17 0741  GLUCAP 92 94 94 91 89    Microbiology: Results for orders placed or performed during the hospital encounter of 05/30/17  Culture, blood (routine x 2)     Status: None (Preliminary result)   Collection Time: 05/31/17 12:05 AM  Result Value Ref Range Status   Specimen Description BLOOD RIGHT HAND  Final   Special Requests   Final    BOTTLES DRAWN AEROBIC AND ANAEROBIC Blood Culture adequate volume   Culture   Final    NO GROWTH 1 DAY Performed at [facility], [address] [place], [place] [address]    Report Status PENDING  Incomplete  Urine culture     Status: Abnormal   Collection Time: 05/31/17 12:05 AM  Result Value Ref Range Status    Specimen Description   Final    URINE, RANDOM Performed at [facility], [address] [place], [place] [address]    Special Requests   Final    NONE Performed at [facility], [address] [place], [place] [address]    Culture MULTIPLE SPECIES PRESENT, SUGGEST RECOLLECTION (A)  Final   Report Status 06/01/2017 FINAL  Final  Culture, blood (routine x 2)     Status: None (Preliminary result)   Collection Time: 05/31/17 12:08 AM  Result Value Ref Range Status   Specimen Description BLOOD LEFT AC  Final   Special Requests   Final    BOTTLES DRAWN AEROBIC AND ANAEROBIC Blood Culture adequate volume   Culture   Final    NO GROWTH 1 DAY Performed at [facility], [address] [place], [place] [address]    Report Status PENDING  Incomplete  MRSA PCR Screening     Status: None   Collection Time: 05/31/17  9:47 AM  Result Value Ref Range Status   MRSA by PCR NEGATIVE NEGATIVE Final    Comment:        The GeneXpert MRSA Assay (FDA approved for NASAL specimens only), is one component of a comprehensive MRSA colonization surveillance program. It is not intended to diagnose MRSA infection nor to guide or monitor treatment for MRSA infections. Performed at [facility], [address] [place], [place] [address]     Coagulation Studies: Recent Labs    05/31/17 0011  LABPROT 17.4*  INR 1.44    Urinalysis: Recent Labs    05/31/17 0005  COLORURINE AMBER*  LABSPEC 1.009  PHURINE 7.0  GLUCOSEU NEGATIVE  HGBUR NEGATIVE  BILIRUBINUR NEGATIVE  KETONESUR 5*  PROTEINUR 30*  NITRITE NEGATIVE  LEUKOCYTESUR MODERATE*      Imaging: US Renal  Result Date: 05/31/2017 CLINICAL DATA:  84 year old male with renal failure. Bladder cancer post cystectomy. Initial encounter. EXAM:  RENAL / URINARY TRACT ULTRASOUND COMPLETE COMPARISON:  04/01/2016 MR. FINDINGS: Right Kidney: Length: 10.6 cm. Thin renal cortex. No hydronephrosis. Upper pole  4.7 x 4.8 x 5.5 cm cyst. Left Kidney: Length: 10.1 cm. Thin renal parenchyma. No hydronephrosis or mass. Bladder: Surgically removed. Ascites. Heterogeneous liver with lobular contour consistent with history of cirrhosis. Splenomegaly. IMPRESSION: Bilateral thin renal parenchyma. No hydronephrosis. 5.5 cm right renal cyst. Urinary bladder surgically removed. Ascites around the liver and spleen. Heterogeneous liver with lobular contour consistent with history of cirrhosis. Splenomegaly. Liver and spleen not completely assessed. Electronically Signed   By: Genia Del M.D.   On: 05/31/2017 12:31   Dg Chest Port 1 View  Result Date: 05/31/2017 CLINICAL DATA:  Status post central line placement EXAM: PORTABLE CHEST 1 VIEW COMPARISON:  05/30/2017 FINDINGS: Cardiac shadow is stable. New left jugular temporary dialysis catheter is seen in satisfactory position. No pneumothorax is noted. Patchy atelectatic changes are noted throughout both lungs. No focal confluent infiltrate is seen. IMPRESSION: No pneumothorax following central line placement. Electronically Signed   By: Inez Catalina M.D.   On: 05/31/2017 13:13     Medications:   . sodium chloride    . sodium chloride 75 mL/hr at 06/01/17 2119  . cefTRIAXone (ROCEPHIN)  IV Stopped (06/02/17 3845)  . pantoprozole (PROTONIX) infusion 8 mg/hr (06/02/17 0731)  . phenylephrine (NEO-SYNEPHRINE) Adult infusion 200 mcg/min (06/02/17 0835)  . vasopressin (PITRESSIN) infusion - *FOR SHOCK* 0.03 Units/min (06/02/17 0459)   . docusate sodium  100 mg Oral BID  . doxazosin  4 mg Oral BID  . ferrous sulfate  325 mg Oral Q breakfast  . insulin aspart  0-5 Units Subcutaneous QHS  . insulin aspart  0-9 Units Subcutaneous TID WC  . magnesium oxide  400 mg Oral Daily  . nystatin   Topical TID  . simvastatin  20 mg Oral QHS  . tiotropium  18 mcg Inhalation Daily  . umeclidinium-vilanterol  1 puff Inhalation Daily  . vitamin B-12  250 mcg Oral Daily    acetaminophen **OR** acetaminophen, albuterol, heparin, morphine injection, ondansetron **OR** ondansetron (ZOFRAN) IV  Assessment/ Plan:  84 y.o. male with a PMHx of osteoarthritis, atrial fibrillation, congestive heart failure, chronic atrial for ablation, COPD, cryptogenic cirrhosis, diabetes mellitus type 2, gastric varices, history of bladder cancer status post ileal conduit, hypertension, hyperlipidemia,  who was admitted to [facility] on 05/30/2017 for evaluation of generalized weakness.  1.  Acute renal failure, possibly due to reduced PO intake, may have also been on diuretics. ANCA negative.  ANA positive with negative smith and DS-DNA, mildly positive RNP ab. Renal US negative for hydronephrosis, thinned renal parenchyma noted.  2.  Chronic kidney disease stage III baseline creatinine 1.8. 3.  Severe hyperkalemia upon admission improved with dialysis.  4.  Generalized weakness, likely secondary to hyperkalemia. 5.  Anemia of chronic kidney disease hemoglobin 9.  Plan: The patient's renal parameters have improved with aggressive therapy.  Urine output did increase to 550 cc over the preceding 24 hours.  We will hold off on dialysis today but may need to reevaluate the patient for dialysis tomorrow if urine output remains relatively low.  Hyperkalemia has improved significantly and potassium currently is 5.0.  We recommend continued monitoring of the parameters as well as CBC.    LOS: 2 [name] 5/9/20198:48 AM

## 2017-06-02 NOTE — Anesthesia Postprocedure Evaluation (Signed)
Anesthesia Post Note  Patient: [name]  Procedure(s) Performed: ESOPHAGOGASTRODUODENOSCOPY (EGD) WITH PROPOFOL (N/A )  Patient location during evaluation: ICU Anesthesia Type: General Level of consciousness: awake and alert and oriented Pain management: satisfactory to patient Vital Signs Assessment: post-procedure vital signs reviewed and stable Respiratory status: respiratory function stable Cardiovascular status: stable Anesthetic complications: no     Last Vitals:  Vitals:   06/02/17 0500 06/02/17 0530  BP: (!) 113/39 (!) 126/40  Pulse: 63 (!) 57  Resp: 13 13  Temp:    SpO2: 96% 97%    Last Pain:  Vitals:   06/02/17 0400  TempSrc: Oral  PainSc:                  Blima Singer

## 2017-06-02 NOTE — Progress Notes (Signed)
Follow up - Critical Care Medicine Note  Patient Details:    [name] is an 84 y.o. male.with acute renal failure and hyperkalemia requiring emergent hemodialysis  Lines, Airways, Drains: Urostomy (Active)  Ostomy Pouch 2 piece 06/01/2017 12:01 AM  Stoma Assessment Pink 06/01/2017 12:01 AM  Output (mL) 35 mL 05/31/2017  5:41 PM    Anti-infectives:  Anti-infectives (From admission, onward)   Start     Dose/Rate Route Frequency Ordered Stop   06/02/17 0515  cefTRIAXone (ROCEPHIN) 1 g in sodium chloride 0.9 % 100 mL IVPB  Status:  Discontinued     1 g 200 mL/hr over 30 Minutes Intravenous Every 24 hours 06/02/17 0509 06/02/17 0509   06/02/17 0515  cefTRIAXone (ROCEPHIN) 1 g in sodium chloride 0.9 % 100 mL IVPB     1 g 200 mL/hr over 30 Minutes Intravenous Daily 06/02/17 0510     05/31/17 1200  cefTRIAXone (ROCEPHIN) 1 g in sodium chloride 0.9 % 100 mL IVPB  Status:  Discontinued     1 g 200 mL/hr over 30 Minutes Intravenous Daily 05/31/17 1156 06/02/17 0510      Microbiology: Results for orders placed or performed during the hospital encounter of 05/30/17  Culture, blood (routine x 2)     Status: None (Preliminary result)   Collection Time: 05/31/17 12:05 AM  Result Value Ref Range Status   Specimen Description BLOOD RIGHT HAND  Final   Special Requests   Final    BOTTLES DRAWN AEROBIC AND ANAEROBIC Blood Culture adequate volume   Culture   Final    NO GROWTH 1 DAY Performed at [facility], [address] [place], [place] [address]    Report Status PENDING  Incomplete  Urine culture     Status: Abnormal   Collection Time: 05/31/17 12:05 AM  Result Value Ref Range Status   Specimen Description   Final    URINE, RANDOM Performed at [facility], [address], [place], [place] [address]    Special Requests   Final    NONE Performed at [facility], [address] [place], [place] [address]    Culture MULTIPLE SPECIES PRESENT, SUGGEST  RECOLLECTION (A)  Final   Report Status 06/01/2017 FINAL  Final  Culture, blood (routine x 2)     Status: None (Preliminary result)   Collection Time: 05/31/17 12:08 AM  Result Value Ref Range Status   Specimen Description BLOOD LEFT AC  Final   Special Requests   Final    BOTTLES DRAWN AEROBIC AND ANAEROBIC Blood Culture adequate volume   Culture   Final    NO GROWTH 1 DAY Performed at [facility], [address] [place], [place] [address]    Report Status PENDING  Incomplete  MRSA PCR Screening     Status: None   Collection Time: 05/31/17  9:47 AM  Result Value Ref Range Status   MRSA by PCR NEGATIVE NEGATIVE Final    Comment:        The GeneXpert MRSA Assay (FDA approved for NASAL specimens only), is one component of a comprehensive MRSA colonization surveillance program. It is not intended to diagnose MRSA infection nor to guide or monitor treatment for MRSA infections. Performed at [facility], [place], [place] [address]   Events:   Studies: Ct Head Wo Contrast  Result Date: 05/31/2017 CLINICAL DATA:  Altered level of consciousness, patient fell out of bed and is hypotensive with bradycardia.  EXAM: CT HEAD WITHOUT CONTRAST TECHNIQUE: Contiguous axial images were obtained from the base of the skull through the vertex without intravenous contrast. COMPARISON:  None. FINDINGS: Brain: Remote appearing right thalamic lacunar infarct. Age related involutional changes of the brain with mild-to-moderate small vessel ischemic disease. No large vascular territory infarction, hemorrhage or midline shift. No intra-axial mass nor extra-axial fluid collections. Vascular: No hyperdense vessel sign. Skull: Intact Sinuses/Orbits: Complete opacification of the included maxillary sinuses with desiccated mucus seen within. Moderate ethmoid sinus mucosal thickening more so anteriorly. The sphenoid sinus is clear. There is right-sided frontal sinus mucosal  opacification. Intact orbits and globes. Other: None IMPRESSION: Paranasal sinus mucosal thickening in opacification as above. Remote appearing small vessel ischemic disease and right thalamic lacunar infarct. No acute intracranial abnormality. Electronically Signed   By: Ashley Royalty M.D.   On: 05/31/2017 01:47   US Renal  Result Date: 05/31/2017 CLINICAL DATA:  84 year old male with renal failure. Bladder cancer post cystectomy. Initial encounter. EXAM: RENAL / URINARY TRACT ULTRASOUND COMPLETE COMPARISON:  04/01/2016 MR. FINDINGS: Right Kidney: Length: 10.6 cm. Thin renal cortex. No hydronephrosis. Upper pole 4.7 x 4.8 x 5.5 cm cyst. Left Kidney: Length: 10.1 cm. Thin renal parenchyma. No hydronephrosis or mass. Bladder: Surgically removed. Ascites. Heterogeneous liver with lobular contour consistent with history of cirrhosis. Splenomegaly. IMPRESSION: Bilateral thin renal parenchyma. No hydronephrosis. 5.5 cm right renal cyst. Urinary bladder surgically removed. Ascites around the liver and spleen. Heterogeneous liver with lobular contour consistent with history of cirrhosis. Splenomegaly. Liver and spleen not completely assessed. Electronically Signed   By: Genia Del M.D.   On: 05/31/2017 12:31   Dg Chest Port 1 View  Result Date: 05/31/2017 CLINICAL DATA:  Status post central line placement EXAM: PORTABLE CHEST 1 VIEW COMPARISON:  05/30/2017 FINDINGS: Cardiac shadow is stable. New left jugular temporary dialysis catheter is seen in satisfactory position. No pneumothorax is noted. Patchy atelectatic changes are noted throughout both lungs. No focal confluent infiltrate is seen. IMPRESSION: No pneumothorax following central line placement. Electronically Signed   By: Inez Catalina M.D.   On: 05/31/2017 13:13   Dg Chest Port 1 View  Result Date: 05/31/2017 CLINICAL DATA:  Weakness and declining health. EXAM: PORTABLE CHEST 1 VIEW COMPARISON:  None. FINDINGS: Cardiomegaly with moderate aortic  atherosclerosis. Pulmonary vascular redistribution with small right effusion compatible with CHF. No acute osseous abnormality. IMPRESSION: Cardiomegaly with mild CHF and small right effusion. Electronically Signed   By: Ashley Royalty M.D.   On: 05/31/2017 00:29    Consults: Treatment Team:  Anthonette Legato, MD [name], MD   Subjective:    Overnight Issues: patient is status post EGD which revealed gastritis, presently on IHD, has required some Neo-Synephrine overnight for blood pressure support also with some desaturations presently on heated high flow  Objective:  Vital signs for last 24 hours: Temp:  [97 F (36.1 C)-99.1 F (37.3 C)] 98.2 F (36.8 C) (05/09 0200) Pulse Rate:  [55-74] 60 (05/09 0700) Resp:  [10-24] 11 (05/09 0700) BP: (101-132)/(34-61) 129/43 (05/09 0700) SpO2:  [89 %-99 %] 98 % (05/09 0700) FiO2 (%):  [54 %-55 %] 55 % (05/09 0100) Weight:  [243 lb 13.3 oz (110.6 kg)-246 lb 11.1 oz (111.9 kg)] 243 lb 13.3 oz (110.6 kg) (05/09 0500)  Hemodynamic parameters for last 24 hours:    Intake/Output from previous day: 05/08 0701 - 05/09 0700 In: 3710.4 [I.V.:3710.4] Out: 550 [Urine:550]  Intake/Output this shift: No intake/output data recorded.  Vent  settings for last 24 hours: FiO2 (%):  [54 %-55 %] 55 %  Physical Exam:  Patient is awake, alert, oriented in no acute distress. Vital signs: Please see above list vital signs Cardiovascular: Regular rate and rhythm Pulmonary: Clear to auscultation Abdominal: Positive bowel sounds, soft exam Extremities: No clubbing or cyanosis Neurologic: No focal deficits appreciated  Assessment/Plan:   K anemia. Repeat potassium is 5.0 on HD. Acute renal failure with prior history of stage III renal disease possible precipitating events were decreased oral intake, dehydration and diuretic therapy.  Anemia. Patient with prior history of cirrhosis, status post EGD which revealed hemorrhagic gastritis. We'll continue PPI, DC  octreotide.H&H is stable will continue Rocephin for SBP prophylaxis  Hx of CHF. Pending ECHO.   Atrial Fibrillation. Bradycardic some improvement with pulse in the 60s    Critical Care Total Time 35 minutes  Dymin Dingledine 06/02/2017  *Care during the described time interval was provided by me and/or other providers on the critical care team.  I have reviewed this patient's available data, including medical history, events of note, physical examination and test results as part of my evaluation. Patient ID: [name], male   DOB: 02-23-1933, 84 y.o.   MRN: [id number]

## 2017-06-02 NOTE — Progress Notes (Signed)
Pharmacy Antibiotic Note  [name] is a 84 y.o. male admitted on 05/30/2017 with UTI.  Pharmacy has been consulted for Ceftriaxone dosing.  Plan: ceftriaxone 1g IV daily  Height: 5\' 8"  (172.7 cm) Weight: 246 lb 11.1 oz (111.9 kg) IBW/kg (Calculated) : 68.4  Temp (24hrs), Avg:98.4 F (36.9 C), Min:97 F (36.1 C), Max:99.1 F (37.3 C)  Recent Labs  Lab 05/31/17 0005 05/31/17 0008  05/31/17 2145 05/31/17 2315 06/01/17 0317 06/01/17 0602 06/02/17 0323  WBC  --  4.4  --  4.8  --   --   --  6.9  CREATININE  --  4.23*   < > 2.80* 2.73* 2.26* 2.32* 2.36*  LATICACIDVEN 1.4  --   --   --   --   --   --   --    < > = values in this interval not displayed.    Estimated Creatinine Clearance: 29.8 mL/min (A) (by C-G formula based on SCr of 2.36 mg/dL (H)).    No Known Allergies  Thank you for allowing pharmacy to be a part of this patient's care.  Tobie Lords, PharmD, BCPS Clinical Pharmacist 06/02/2017

## 2017-06-02 NOTE — Evaluation (Addendum)
Clinical/Bedside Swallow Evaluation Patient Details  Name: [name] MRN: [id number] Date of Birth: 1934-01-26  Today's Date: 06/02/2017 Time: SLP Start Time (ACUTE ONLY): 1500 SLP Stop Time (ACUTE ONLY): 1600 SLP Time Calculation (min) (ACUTE ONLY): 60 min  Past Medical History:  Past Medical History:  Diagnosis Date  . Arthritis   . Atrial fibrillation ([place]) 06/23/2010  . B12 deficiency   . C. difficile colitis 07/06/2011  . CHF (congestive heart failure) (HCC)    diastolic. Echo 5/10 with mild LVH, EF 09%, grade 1 diastolic dysfunction, severe left atrial enlargement, aortic sclerosis  . Chronic a-fib (HCC)    on pradaxa, declined cardioversion in past  . COPD (chronic obstructive pulmonary disease) ([place])    (Dr. Raul Del)  . Cryptogenic cirrhosis ([place])    Followed by Dr Fuller Plan, never biopsied, has been stable.  History of gastric varices.  . Diabetes mellitus, type 2 ([place])   . Gastric and duodenal angiodysplasia 06/28/2011  . Gastric varices   . GI bleed   . Hemorrhoids   . History of bladder cancer 1984   s/p ileal conduit  . History of ileostomy   . History of pneumonia 2012  . Hyperlipidemia   . Hypertension   . Inguinal hernia    left  . Iron deficiency anemia   . Kidney atrophy 02/2016  . Pinched nerve    back  . Pyloric stenosis    mild EGD 10/26/06  . RLL pneumonia ([place]) 01/28/2014  . Shortness of breath    Past Surgical History:  Past Surgical History:  Procedure Laterality Date  . BASAL CELL CARCINOMA EXCISION  2019   multiple sites  . BE  08/08/2003  . BLADDER REMOVAL     s/p urostomy bag  . COLONOSCOPY  07/24/2003   Int. hemorrhoids  . COLONOSCOPY  10/26/2006   Int hemorrhoids, 10 yrs  . ESOPHAGOGASTRODUODENOSCOPY  10/26/2006   Gastric varices, pyloric stenosis  . ESOPHAGOGASTRODUODENOSCOPY  07/01/2011   Procedure: ESOPHAGOGASTRODUODENOSCOPY (EGD);  Surgeon: Jerene Bears, MD;  Location: [facility];  Service: Gastroenterology;  Laterality: N/A;  .  ESOPHAGOGASTRODUODENOSCOPY  06/28/2011   Procedure: ESOPHAGOGASTRODUODENOSCOPY (EGD);  Surgeon: Jerene Bears, MD;  Location: [facility];  Service: Gastroenterology;  Laterality: N/A;  . ESOPHAGOGASTRODUODENOSCOPY (EGD) WITH PROPOFOL N/A 06/01/2017   Procedure: ESOPHAGOGASTRODUODENOSCOPY (EGD) WITH PROPOFOL;  Surgeon: [name], MD;  Location: [facility] ENDOSCOPY;  Service: Gastroenterology;  Laterality: N/A;  . FETAL BLOOD TRANSFUSION    . ILEOSTOMY  1984   bladder cancer  . PENILE PROSTHESIS IMPLANT  1990   inflatable implant  . PROSTATECTOMY  1984   bladder cancer  . US ECHOCARDIOGRAPHY  05/1995   EF 60% TR, AI   HPI:   Pt is an 84 y/o male w/ past medical history of multiple medical issues including Gastric bleeding, cdiff, gastric varices(EGD on 06/01/17), pyloric stenosis, CHF, DM2, atrial fibrillation, COPD, hemorrhoids, hypertension and diastolic heart failure presents the emergency department complaining of weakness.  The patient's daughter states that his dramatic decline in strength started the day after his varicose vein laser ablation.  Today the patient slid out of bed.  He did not fall or hit his head.  He did not lose consciousness but felt lightheaded and weak.  The patient's primary doctor had given him a fecal occult blood test kit a few weeks ago as he had noticed the patient's blood count gradually dropping over time.  The patient has not seen any frank blood in his stool  but admits to dark-colored bowel movements.  He denies abdominal pain or hematemesis.  The patient also denies chest pain but admits to chronic left shoulder pain.  Laboratory evaluation in the emergency department revealed elevated potassium as well as significantly worsened kidney function.  The patient was also hypotensive and bradycardic which prompted the emergency department staff to call the hospitalist service for admission. During this admission, and post GI assessment, pt has been placed on a Clear Liquid diet  per GI. Noted CXR 06/02/17 w/ concern for Increasing right upper lobe atelectasis and right pleural effusion - of note, pt has had RLL in 2016(could be related to his GI issues?).    Assessment / Plan / Recommendation Clinical Impression  Pt appears to present w/ min increased risk for oropharyngeal phase dysphagia secondary to his decline medical and respiratory status' as well as his GI history of the gastric varices - any Esophageal dysmotility or retrograde activity/Reflux can impact Pulmonary status. Pt is currently on HFNC O2 support. He has been bedbound recently and unable to use his UEs for self-feeding; he is reclined in the bed and unable to help himself sit upright. Pt was given po trials of Nectar consistency liquids via TSP, then cup. W/ aspiration precautions, careful monitoring and feeding support, and modifying liquid consistency to NECTAR, pt appeared to tolerate po trials via tsp and cup w/ no immediate, overt s/s of aspiration noted - similar was noted w/ trials of applesauce w/ crushed meds (w/ NSG) per MD ok for applesauce. Oral phase was appropriate for bolus management and timely oral clearing w/ the trial consistencies given.  No other trial consistencies were assessed at this evaluation d/t pt's respiratory status and GI restrictions. ST services will f/u w/ trials to upgrade diet when appropriate per pt's medical status. NSG updated.  Recommend a clear liquid diet of Nectar consistency w/ aspiration precautions; feeding support w/ po's. Recommend pills in puree for safer swallowing. Recommend Reflux precautions. SLP Visit Diagnosis: Dysphagia, oropharyngeal phase (R13.12)    Aspiration Risk  Mild aspiration risk;Risk for inadequate nutrition/hydration    Diet Recommendation  Nectar consistency Clear Liquid diet (Per GI currently). General aspiration precautions; Reflux precautions  Medication Administration: Crushed with puree    Other  Recommendations Recommended  Consults: (Dietician f/u) Oral Care Recommendations: Oral care BID;Staff/trained caregiver to provide oral care Other Recommendations: Order thickener from pharmacy;Prohibited food (jello, ice cream, thin soups);Remove water pitcher;Have oral suction available   Follow up Recommendations (TBD)      Frequency and Duration min 3x week  2 weeks       Prognosis Prognosis for Safe Diet Advancement: Fair Barriers to Reach Goals: (deconditioning)      Swallow Study   General Date of Onset: 05/30/17 Type of Study: Bedside Swallow Evaluation Previous Swallow Assessment: none reported Diet Prior to this Study: Thin liquids(clear liquids diet per GI post EGD) Temperature Spikes Noted: (wbc 6.9;  temp 99.1) Respiratory Status: Other (comment)(HFNC 35%) History of Recent Intubation: No Behavior/Cognition: Alert;Cooperative;Pleasant mood;Requires cueing(min) Oral Cavity Assessment: Within Functional Limits Oral Care Completed by SLP: Recent completion by staff Oral Cavity - Dentition: Edentulous Vision: (n/a) Self-Feeding Abilities: Total assist(weak UEs) Patient Positioning: Upright in bed(needed support) Baseline Vocal Quality: Normal;Low vocal intensity Volitional Cough: Congested;Strong Volitional Swallow: Able to elicit    Oral/Motor/Sensory Function Overall Oral Motor/Sensory Function: Within functional limits   Ice Chips Ice chips: Within functional limits Presentation: Spoon(fed; 3 trials)   Thin Liquid Thin Liquid: Not tested  Nectar Thick Nectar Thick Liquid: Within functional limits Presentation: Cup;Spoon(fed; 10 trials total) Other Comments: no decline in O2 sats, RR or HR   Honey Thick Honey Thick Liquid: Not tested   Puree Puree: Within functional limits Presentation: Spoon(fed; 8 trials) Other Comments: similar presentation; w/ crushed meds   Solid   GO   Solid: Not tested         Orinda Kenner, MS, CCC-SLP Watson,Katherine 06/02/2017,4:03 PM

## 2017-06-03 DIAGNOSIS — E876 Hypokalemia: Secondary | ICD-10-CM

## 2017-06-03 DIAGNOSIS — K921 Melena: Secondary | ICD-10-CM

## 2017-06-03 LAB — RENAL FUNCTION PANEL
ALBUMIN: 2.4 g/dL — AB (ref 3.5–5.0)
ALBUMIN: 2.4 g/dL — AB (ref 3.5–5.0)
ANION GAP: 4 — AB (ref 5–15)
Albumin: 2.3 g/dL — ABNORMAL LOW (ref 3.5–5.0)
Albumin: 2.5 g/dL — ABNORMAL LOW (ref 3.5–5.0)
Anion gap: 5 (ref 5–15)
Anion gap: 3 — ABNORMAL LOW (ref 5–15)
Anion gap: 5 (ref 5–15)
BUN: 33 mg/dL — ABNORMAL HIGH (ref 6–20)
BUN: 31 mg/dL — ABNORMAL HIGH (ref 6–20)
BUN: 27 mg/dL — ABNORMAL HIGH (ref 6–20)
BUN: 25 mg/dL — ABNORMAL HIGH (ref 6–20)
CALCIUM: 7.5 mg/dL — AB (ref 8.9–10.3)
CHLORIDE: 109 mmol/L (ref 101–111)
CHLORIDE: 107 mmol/L (ref 101–111)
CO2: 23 mmol/L (ref 22–32)
CO2: 23 mmol/L (ref 22–32)
CO2: 24 mmol/L (ref 22–32)
CO2: 24 mmol/L (ref 22–32)
CREATININE: 2 mg/dL — AB (ref 0.61–1.24)
CREATININE: 1.8 mg/dL — AB (ref 0.61–1.24)
CREATININE: 1.62 mg/dL — AB (ref 0.61–1.24)
Calcium: 7.7 mg/dL — ABNORMAL LOW (ref 8.9–10.3)
Calcium: 7.7 mg/dL — ABNORMAL LOW (ref 8.9–10.3)
Calcium: 7.9 mg/dL — ABNORMAL LOW (ref 8.9–10.3)
Chloride: 110 mmol/L (ref 101–111)
Chloride: 108 mmol/L (ref 101–111)
Creatinine, Ser: 2.54 mg/dL — ABNORMAL HIGH (ref 0.61–1.24)
GFR calc Af Amer: 44 mL/min — ABNORMAL LOW (ref >60)
GFR calc non Af Amer: 22 mL/min — ABNORMAL LOW (ref >60)
GFR calc non Af Amer: 30 mL/min — ABNORMAL LOW (ref >60)
GFR calc non Af Amer: 34 mL/min — ABNORMAL LOW (ref >60)
GFR, EST AFRICAN AMERICAN: 26 mL/min — AB (ref >60)
GFR, EST AFRICAN AMERICAN: 34 mL/min — AB (ref >60)
GFR, EST AFRICAN AMERICAN: 39 mL/min — AB (ref >60)
GFR, EST NON AFRICAN AMERICAN: 38 mL/min — AB (ref >60)
GLUCOSE: 119 mg/dL — AB (ref 65–99)
GLUCOSE: 112 mg/dL — AB (ref 65–99)
GLUCOSE: 117 mg/dL — AB (ref 65–99)
Glucose, Bld: 114 mg/dL — ABNORMAL HIGH (ref 65–99)
PHOSPHORUS: 3.4 mg/dL (ref 2.5–4.6)
PHOSPHORUS: 3 mg/dL (ref 2.5–4.6)
POTASSIUM: 5.3 mmol/L — AB (ref 3.5–5.1)
POTASSIUM: 5 mmol/L (ref 3.5–5.1)
POTASSIUM: 4.9 mmol/L (ref 3.5–5.1)
Phosphorus: 4.2 mg/dL (ref 2.5–4.6)
Phosphorus: 2.9 mg/dL (ref 2.5–4.6)
Potassium: 4.8 mmol/L (ref 3.5–5.1)
SODIUM: 135 mmol/L (ref 135–145)
Sodium: 137 mmol/L (ref 135–145)
Sodium: 137 mmol/L (ref 135–145)
Sodium: 136 mmol/L (ref 135–145)

## 2017-06-03 LAB — TYPE AND SCREEN
ABO/RH(D): O POS
Antibody Screen: POSITIVE
PT AG TYPE: NEGATIVE
Unit division: 0
Unit division: 0
Unit division: 0

## 2017-06-03 LAB — GLUCOSE, CAPILLARY
Glucose-Capillary: 112 mg/dL — ABNORMAL HIGH (ref 65–99)
Glucose-Capillary: 113 mg/dL — ABNORMAL HIGH (ref 65–99)
Glucose-Capillary: 108 mg/dL — ABNORMAL HIGH (ref 65–99)
Glucose-Capillary: 111 mg/dL — ABNORMAL HIGH (ref 65–99)

## 2017-06-03 LAB — CBC WITH DIFFERENTIAL/PLATELET
BASOS PCT: 1 %
Basophils Absolute: 0 10*3/uL (ref 0–0.1)
Eosinophils Absolute: 0.2 10*3/uL (ref 0–0.7)
Eosinophils Relative: 4 %
HCT: 28.8 % — ABNORMAL LOW (ref 40.0–52.0)
Hemoglobin: 9.8 g/dL — ABNORMAL LOW (ref 13.0–18.0)
Lymphocytes Relative: 18 %
Lymphs Abs: 1 10*3/uL (ref 1.0–3.6)
MCH: 32.3 pg (ref 26.0–34.0)
MCHC: 33.8 g/dL (ref 32.0–36.0)
MCV: 95.7 fL (ref 80.0–100.0)
Monocytes Absolute: 0.9 10*3/uL (ref 0.2–1.0)
Monocytes Relative: 16 %
NEUTROS PCT: 61 %
Neutro Abs: 3.6 10*3/uL (ref 1.4–6.5)
Platelets: 160 10*3/uL (ref 150–440)
RBC: 3.01 MIL/uL — ABNORMAL LOW (ref 4.40–5.90)
RDW: 16 % — AB (ref 11.5–14.5)
WBC: 5.8 10*3/uL (ref 3.8–10.6)

## 2017-06-03 LAB — BPAM RBC
BLOOD PRODUCT EXPIRATION DATE: 201906012359
Blood Product Expiration Date: 201905292359
Blood Product Expiration Date: 201905292359
ISSUE DATE / TIME: 201905071136
UNIT TYPE AND RH: 5100
Unit Type and Rh: 5100
Unit Type and Rh: 5100

## 2017-06-03 LAB — URINE CULTURE

## 2017-06-03 LAB — MAGNESIUM
MAGNESIUM: 2 mg/dL (ref 1.7–2.4)
MAGNESIUM: 1.9 mg/dL (ref 1.7–2.4)
MAGNESIUM: 1.8 mg/dL (ref 1.7–2.4)
Magnesium: 1.9 mg/dL (ref 1.7–2.4)

## 2017-06-03 LAB — BASIC METABOLIC PANEL
Anion gap: 5 (ref 5–15)
BUN: 31 mg/dL — AB (ref 6–20)
CALCIUM: 7.6 mg/dL — AB (ref 8.9–10.3)
CO2: 24 mmol/L (ref 22–32)
CREATININE: 2.5 mg/dL — AB (ref 0.61–1.24)
Chloride: 108 mmol/L (ref 101–111)
GFR calc Af Amer: 26 mL/min — ABNORMAL LOW (ref >60)
GFR calc non Af Amer: 23 mL/min — ABNORMAL LOW (ref >60)
GLUCOSE: 111 mg/dL — AB (ref 65–99)
Potassium: 5.2 mmol/L — ABNORMAL HIGH (ref 3.5–5.1)
Sodium: 137 mmol/L (ref 135–145)

## 2017-06-03 LAB — PROCALCITONIN: Procalcitonin: 0.2 ng/mL

## 2017-06-03 MED ORDER — SODIUM CHLORIDE 0.9 % IV SOLN
3.0000 g | Freq: Two times a day (BID) | INTRAVENOUS | Status: DC
  Administered 2017-06-03: 3 g via INTRAVENOUS
  Filled 2017-06-03 (×2): qty 3

## 2017-06-03 MED ORDER — PUREFLOW DIALYSIS SOLUTION
INTRAVENOUS | Status: DC
  Administered 2017-06-03 – 2017-06-04 (×5): via INTRAVENOUS_CENTRAL
  Administered 2017-06-04 – 2017-06-05 (×3): 3 via INTRAVENOUS_CENTRAL
  Administered 2017-06-05 (×2): via INTRAVENOUS_CENTRAL
  Administered 2017-06-06: 3 via INTRAVENOUS_CENTRAL

## 2017-06-03 MED ORDER — DOPAMINE-DEXTROSE 1.6-5 MG/ML-% IV SOLN: 2.0000 ug/kg/min | Freq: Once | INTRAVENOUS | Status: DC

## 2017-06-03 MED ORDER — DOPAMINE-DEXTROSE 3.2-5 MG/ML-% IV SOLN
0.0000 ug/kg/min | INTRAVENOUS | Status: DC
  Administered 2017-06-03: 5 ug/kg/min via INTRAVENOUS
  Administered 2017-06-04: 4 ug/kg/min via INTRAVENOUS
  Filled 2017-06-03: qty 250

## 2017-06-03 MED ORDER — VANCOMYCIN HCL IN DEXTROSE 1-5 GM/200ML-% IV SOLN
1000.0000 mg | Freq: Once | INTRAVENOUS | Status: DC
  Filled 2017-06-03: qty 200

## 2017-06-03 MED ORDER — SODIUM CHLORIDE 0.9 % IV SOLN
3.0000 g | Freq: Four times a day (QID) | INTRAVENOUS | Status: DC
  Administered 2017-06-03 – 2017-06-04 (×3): 3 g via INTRAVENOUS
  Filled 2017-06-03 (×6): qty 3

## 2017-06-03 MED ORDER — HEPARIN SODIUM (PORCINE) 1000 UNIT/ML DIALYSIS: 1000.0000 [IU] | INTRAMUSCULAR | Status: DC | PRN

## 2017-06-03 MED ORDER — VANCOMYCIN HCL 10 G IV SOLR
1250.0000 mg | Freq: Every day | INTRAVENOUS | Status: DC
  Administered 2017-06-03 – 2017-06-05 (×3): 1250 mg via INTRAVENOUS
  Filled 2017-06-03 (×4): qty 1250

## 2017-06-03 NOTE — Progress Notes (Signed)
[facility] NAME: Kandon Hosking    MR#:  [id number]  DATE OF BIRTH:  [birth year]  SUBJECTIVE:   Patient continues on CRRT.  Feels extremely weak.  Has shortness of breath.  Able to lay flat.   REVIEW OF SYSTEMS:    ROS  CONSTITUTIONAL: No documented fever. Has fatigue, weakness. No weight gain, no weight loss.  EYES: No blurry or double vision.  ENT: No tinnitus. No postnasal drip. No redness of the oropharynx.  RESPIRATORY: No cough, no wheeze, no hemoptysis. No dyspnea.  CARDIOVASCULAR: No chest pain. No orthopnea. No palpitations. No syncope.  GASTROINTESTINAL: No nausea, no vomiting or diarrhea. No abdominal pain. No melena or hematochezia.  GENITOURINARY: No dysuria or hematuria.  ENDOCRINE: No polyuria or nocturia. No heat or cold intolerance.  HEMATOLOGY: No anemia. No bruising. No bleeding.  INTEGUMENTARY: No rashes. No lesions.  MUSCULOSKELETAL: No arthritis. No swelling. No gout.  NEUROLOGIC: No numbness, tingling, or ataxia. No seizure-type activity.  PSYCHIATRIC: No anxiety. No insomnia. No ADD.   DRUG ALLERGIES:  No Known Allergies  VITALS:  Blood pressure (!) 131/45, pulse (!) 55, temperature 98.1 F (36.7 C), temperature source Oral, resp. rate 17, height 5\' 8"  (1.727 m), weight 111.6 kg (246 lb 0.5 oz), SpO2 94 %.  PHYSICAL EXAMINATION:   Physical Exam  GENERAL:  84 y.o.-year-old patient lying in the bed . Looks critically ill EYES: Pupils equal, round, reactive to light and accommodation. No scleral icterus. Extraocular muscles intact.  HEENT: Head atraumatic, normocephalic. Oropharynx dry and nasopharynx clear.  NECK:  Supple, no jugular venous distention. No thyroid enlargement, no tenderness.  LUNGS: Bilateral crackles CARDIOVASCULAR: S1, S2 normal. No murmurs, rubs, or gallops.  ABDOMEN: Soft, nontender, nondistended. Bowel sounds present. No organomegaly or mass.  EXTREMITIES: No cyanosis, clubbing or  edema b/l.    NEUROLOGIC: Cranial nerves II through XII are intact. No focal Motor or sensory deficits b/l.   PSYCHIATRIC: The patient is alert and awake SKIN: No obvious rash, lesion, or ulcer.   LABORATORY PANEL:   CBC Recent Labs  Lab 06/03/17 0433  WBC 5.8  HGB 9.8*  HCT 28.8*  PLT 160   ------------------------------------------------------------------------------------------------------------------ Chemistries  Recent Labs  Lab 05/31/17 0008  06/03/17 0950  NA 132*   < > 137  K 7.2*   < > 5.3*  CL 104   < > 110  CO2 21*   < > 23  GLUCOSE 116*   < > 119*  BUN 56*   < > 33*  CREATININE 4.23*   < > 2.54*  CALCIUM 7.8*   < > 7.7*  MG  --    < > 2.0  AST 45*  --   --   ALT 16*  --   --   ALKPHOS 110  --   --   BILITOT 1.0  --   --    < > = values in this interval not displayed.   ------------------------------------------------------------------------------------------------------------------  Cardiac Enzymes Recent Labs  Lab 05/31/17 0008  TROPONINI <0.03   ------------------------------------------------------------------------------------------------------------------  RADIOLOGY:  Dg Chest Port 1 View  Result Date: 06/02/2017 CLINICAL DATA:  Acute respiratory failure EXAM: PORTABLE CHEST 1 VIEW COMPARISON:  05/31/2017 FINDINGS: Cardiac shadow is within normal limits. Left jugular central line is again seen and stable. Some increasing atelectasis is noted in the right upper lobe along the minor fissure. Additionally slight increase in right-sided pleural effusion is noted as well. No  other focal abnormality is noted. IMPRESSION: Increasing right upper lobe atelectasis and right pleural effusion. Electronically Signed   By: Inez Catalina M.D.   On: 06/02/2017 10:21   ASSESSMENT AND PLAN:  84 year old ill patient with history of for diabetes mellitus, COPD, hyperlipidemia, chronic kidney disease currently in stepdown unit for hyperkalemia and worsening renal  function  -Acute on chronic renal failure CKD stage III baseline On CRRT  -Hyperkalemia resolved  -GI bleed Anemia of chronic disease Status post gastroenterology evaluation Status post endoscopy - Gastritis with hemorrhage  IV PPI  - Septic shock due to Pneumonia On IV pressors and IV abx Blood and urine Cx negtive  -COPD stable  -Type 2 diabetes mellitus Sliding scale coverage with insulin  -DVT prophylaxis SCDs  All the records are reviewed and case discussed with Care Management/Social Worker. Management plans discussed with the patient, family and they are in agreement.  CODE STATUS: Full Code  DVT Prophylaxis: SCDs  TOTAL TIME TAKING CARE OF THIS PATIENT: 30 minutes.   Leia Alf Dontay Harm M.D on 06/03/2017 at 12:53 PM  Between 7am to 6pm - Pager - [phone]  After 6pm go to [email or web address] - password [email or web address] [facility]  Office  [phone]  CC: Primary care physician; Ria Bush, MD  Note: This dictation was prepared with Dragon dictation along with smaller phrase technology. Any transcriptional errors that result from this process are unintentional.

## 2017-06-03 NOTE — Progress Notes (Signed)
[name] , MD [address] [place], [place], [place], [address] [address], [place], [place], [place], [address] Phone: [phone]  Fax: [phone]   [name] is being followed for Gi bleed    Subjective: Patient drowsy discussed with nurse- no overt bleeding , back on pressors and CRRT   Objective: Vital signs in last 24 hours: Vitals:   06/03/17 0600 06/03/17 0700 06/03/17 0730 06/03/17 0800  BP: (!) 114/45 (!) 125/45 (!) 125/47   Pulse: (!) 59 62 (!) 56   Resp: 16 14 19    Temp:    97.7 F (36.5 C)  TempSrc:    Oral  SpO2: 94% 94% 93%   Weight:      Height:       Weight change: 0.5 oz (0.015 kg)  Intake/Output Summary (Last 24 hours) at 06/03/2017 1037 Last data filed at 06/03/2017 0600 Gross per 24 hour  Intake 3232.41 ml  Output 325 ml  Net 2907.41 ml     Exam: Heart:: Regular rate and rhythm, S1S2 present or without murmur or extra heart sounds Lungs: normal, clear to auscultation and clear to auscultation and percussion Abdomen: soft, nontender, normal bowel sounds   Lab Results: @LABTEST2 @ Micro Results: Recent Results (from the past 240 hour(s))  Culture, blood (routine x 2)     Status: None (Preliminary result)   Collection Time: 05/31/17 12:05 AM  Result Value Ref Range Status   Specimen Description BLOOD RIGHT HAND  Final   Special Requests   Final    BOTTLES DRAWN AEROBIC AND ANAEROBIC Blood Culture adequate volume   Culture   Final    NO GROWTH 3 DAYS Performed at [facility], [address] [place], [place] [address]    Report Status PENDING  Incomplete  Urine culture     Status: Abnormal   Collection Time: 05/31/17 12:05 AM  Result Value Ref Range Status   Specimen Description   Final    URINE, RANDOM Performed at [facility], [address], [place], [place] [address]    Special Requests   Final    NONE Performed at [facility], [address] [place], [place] [address]    Culture  MULTIPLE SPECIES PRESENT, SUGGEST RECOLLECTION (A)  Final   Report Status 06/01/2017 FINAL  Final  Culture, blood (routine x 2)     Status: None (Preliminary result)   Collection Time: 05/31/17 12:08 AM  Result Value Ref Range Status   Specimen Description BLOOD LEFT AC  Final   Special Requests   Final    BOTTLES DRAWN AEROBIC AND ANAEROBIC Blood Culture adequate volume   Culture   Final    NO GROWTH 3 DAYS Performed at [facility], [address] [place], [place] [address]    Report Status PENDING  Incomplete  MRSA PCR Screening     Status: None   Collection Time: 05/31/17  9:47 AM  Result Value Ref Range Status   MRSA by PCR NEGATIVE NEGATIVE Final    Comment:        The GeneXpert MRSA Assay (FDA approved for NASAL specimens only), is one component of a comprehensive MRSA colonization surveillance program. It is not intended to diagnose MRSA infection nor to guide or monitor treatment for MRSA infections. Performed at [facility], [address] [place], [place] [address]    Studies/Results: Dg Chest Port 1 View  Result Date: 06/02/2017 CLINICAL DATA:  Acute respiratory failure EXAM:  PORTABLE CHEST 1 VIEW COMPARISON:  05/31/2017 FINDINGS: Cardiac shadow is within normal limits. Left jugular central line is again seen and stable. Some increasing atelectasis is noted in the right upper lobe along the minor fissure. Additionally slight increase in right-sided pleural effusion is noted as well. No other focal abnormality is noted. IMPRESSION: Increasing right upper lobe atelectasis and right pleural effusion. Electronically Signed   By: Inez Catalina M.D.   On: 06/02/2017 10:21   Medications: I have reviewed the patient's current medications. Scheduled Meds: . docusate sodium  100 mg Oral BID  . ferrous sulfate  325 mg Oral Q breakfast  . insulin aspart  0-5 Units Subcutaneous QHS  . insulin aspart  0-9 Units Subcutaneous TID WC  . magnesium oxide  400 mg  Oral Daily  . nystatin   Topical TID  . pantoprazole (PROTONIX) IV  40 mg Intravenous Q12H  . simvastatin  20 mg Oral QHS  . tiotropium  18 mcg Inhalation Daily  . umeclidinium-vilanterol  1 puff Inhalation Daily  . vitamin B-12  250 mcg Oral Daily   Continuous Infusions: . sodium chloride    . sodium chloride 75 mL/hr at 06/03/17 0200  . ampicillin-sulbactam (UNASYN) IV    . phenylephrine (NEO-SYNEPHRINE) Adult infusion 90 mcg/min (06/03/17 0843)  . pureflow    . vancomycin    . vasopressin (PITRESSIN) infusion - *FOR SHOCK* 0.03 Units/min (06/03/17 0200)   PRN Meds:.acetaminophen **OR** acetaminophen, albuterol, heparin, morphine injection, ondansetron **OR** ondansetron (ZOFRAN) IV   Assessment: Active Problems:   Acute kidney injury superimposed on chronic kidney disease ([place])   .[name] is a 84 y.o. y/o male with cirrhosis of the liver admitted with AKI and hyperkalemia which is acute. I had been consulted for melena and drop in Hb. EGD showed features suggestive of ischemic gastritis.    Plan: 1. No further GI intervention 2. Continue antibiotics and supportive care.   I will sign off.  Please call me if any further GI concerns or questions.  We would like to thank you for the opportunity to participate in the care of [name].     LOS: 3 days   [name], MD 06/03/2017, 10:37 AM

## 2017-06-03 NOTE — Progress Notes (Signed)
CRRT Medication Adjustments:  Pharmacy consulted for CRRT medication adjustments for 84 yo male re-initiated on CRRT on 5/10. Antibiotics have been adjusted. No further adjustments warranted at this time.   Pharmacy will continue to monitor and adjust per consult.

## 2017-06-03 NOTE — Progress Notes (Signed)
Pt had episode of bradycardia down to 39, but HR went back up to low 40s-mid 50s rapidly. Pt sleeping at time and awoke without difficulty. Discussed with Dr. Jefferson Fuel and orders received to hold vaso, discontinue neo, and start dopamine to support BP and HR. Will continue to monitor.

## 2017-06-03 NOTE — Progress Notes (Signed)
[facility]  ROUNDING NOTE   Subjective:  Patient remains on high flow nasal cannula. Still on 2 pressors. Urine output was only 325 cc over the preceding 24 hours.   Objective:  Vital signs in last 24 hours:  Temp:  [97.7 F (36.5 C)-98.7 F (37.1 C)] 97.7 F (36.5 C) (05/10 0800) Pulse Rate:  [55-64] 56 (05/10 0730) Resp:  [13-20] 19 (05/10 0730) BP: (81-130)/(34-56) 125/47 (05/10 0730) SpO2:  [91 %-96 %] 93 % (05/10 0730) FiO2 (%):  [45 %] 45 % (05/10 0800) Weight:  [111.6 kg (246 lb 0.5 oz)] 111.6 kg (246 lb 0.5 oz) (05/10 0500)  Weight change: 0.015 kg (0.5 oz) Filed Weights   06/01/17 1833 06/02/17 0500 06/03/17 0500  Weight: 111.9 kg (246 lb 11.1 oz) 110.6 kg (243 lb 13.3 oz) 111.6 kg (246 lb 0.5 oz)    Intake/Output: I/O last 3 completed shifts: In: 5585.4 [I.V.:5485.4; IV Piggyback:100] Out: 525 [Urine:525]   Intake/Output this shift:  No intake/output data recorded.  Physical Exam: General: No acute distress  Head: Normocephalic, atraumatic. High flow nasal canula on  Eyes: Anicteric  Neck: Supple, trachea midline  Lungs:  Scattered rhonchi, normal effort  Heart: S1S2 no rubs  Abdomen:  Soft, nontender, bowel sounds present  Extremities: Trace peripheral edema.  Neurologic: Awake, alert, following commands  Skin: No lesions  Access: Left IJ temporary dialysis catheter    Basic Metabolic Panel: Recent Labs  Lab 05/31/17 1533 05/31/17 1807  05/31/17 2145 05/31/17 2315 06/01/17 0317 06/01/17 0602 06/01/17 1700 06/02/17 0323 06/03/17 0433  NA 134* 134*   < > 135 133* 136 135  --  135 137  K 6.5* 6.4*   < > 5.8* 5.6* 5.2* 5.1  --  5.0 5.2*  CL 107 107   < > 108 105 107 105  --  106 108  CO2 22 23   < > 24 24 25 24   --  25 24  GLUCOSE 136* 115*   < > 116* 108* 102* 102*  --  92 111*  BUN 56* 51*   < > 41* 42* 34* 35*  --  25* 31*  CREATININE 3.60* 3.28*   < > 2.80* 2.73* 2.26* 2.32*  --  2.36* 2.50*  CALCIUM 7.6* 7.6*   < > 7.5*  7.5* 7.5* 7.7*  --  7.4* 7.6*  MG 2.6* 2.7*  --  2.2  --  2.1 2.1  --   --   --   PHOS 4.7* 4.6   < > 4.0 4.0 3.4 3.7 4.6  --   --    < > = values in this interval not displayed.    Liver Function Tests: Recent Labs  Lab 05/31/17 0008  05/31/17 2046 05/31/17 2145 05/31/17 2315 06/01/17 0317 06/01/17 0602  AST 45*  --   --   --   --   --   --   ALT 16*  --   --   --   --   --   --   ALKPHOS 110  --   --   --   --   --   --   BILITOT 1.0  --   --   --   --   --   --   PROT 5.8*  --   --   --   --   --   --   ALBUMIN 2.5*   < > 2.4* 2.4* 2.4* 2.4* 2.4*   < > =  values in this interval not displayed.   No results for input(s): LIPASE, AMYLASE in the last 168 hours. No results for input(s): AMMONIA in the last 168 hours.  CBC: Recent Labs  Lab 05/31/17 0008 05/31/17 2145 06/02/17 0323 06/03/17 0433  WBC 4.4 4.8 6.9 5.8  NEUTROABS 3.4  --  4.9 3.6  HGB 8.1* 8.9* 9.0* 9.8*  HCT 25.1* 27.1* 27.2* 28.8*  MCV 97.9 96.1 96.3 95.7  PLT 121* 125* 149* 160    Cardiac Enzymes: Recent Labs  Lab 05/31/17 0008 05/31/17 1026  CKTOTAL  --  99  TROPONINI <0.03  --     BNP: Invalid input(s): POCBNP  CBG: Recent Labs  Lab 06/02/17 0741 06/02/17 1205 06/02/17 1631 06/02/17 2222 06/03/17 0740  GLUCAP 89 88 115* 102* 112*    Microbiology: Results for orders placed or performed during the hospital encounter of 05/30/17  Culture, blood (routine x 2)     Status: None (Preliminary result)   Collection Time: 05/31/17 12:05 AM  Result Value Ref Range Status   Specimen Description BLOOD RIGHT HAND  Final   Special Requests   Final    BOTTLES DRAWN AEROBIC AND ANAEROBIC Blood Culture adequate volume   Culture   Final    NO GROWTH 3 DAYS Performed at [facility], [address], [place], [place] [address]    Report Status PENDING  Incomplete  Urine culture     Status: Abnormal   Collection Time: 05/31/17 12:05 AM  Result Value Ref Range Status   Specimen  Description   Final    URINE, RANDOM Performed at [facility], [address], [place], [place] [address]    Special Requests   Final    NONE Performed at [facility], [address] [place], [place] [address]    Culture MULTIPLE SPECIES PRESENT, SUGGEST RECOLLECTION (A)  Final   Report Status 06/01/2017 FINAL  Final  Culture, blood (routine x 2)     Status: None (Preliminary result)   Collection Time: 05/31/17 12:08 AM  Result Value Ref Range Status   Specimen Description BLOOD LEFT AC  Final   Special Requests   Final    BOTTLES DRAWN AEROBIC AND ANAEROBIC Blood Culture adequate volume   Culture   Final    NO GROWTH 3 DAYS Performed at [facility], [address] [place], [place] [address]    Report Status PENDING  Incomplete  MRSA PCR Screening     Status: None   Collection Time: 05/31/17  9:47 AM  Result Value Ref Range Status   MRSA by PCR NEGATIVE NEGATIVE Final    Comment:        The GeneXpert MRSA Assay (FDA approved for NASAL specimens only), is one component of a comprehensive MRSA colonization surveillance program. It is not intended to diagnose MRSA infection nor to guide or monitor treatment for MRSA infections. Performed at [facility], [address] [place], [place] [address]     Coagulation Studies: No results for input(s): LABPROT, INR in the last 72 hours.  Urinalysis: No results for input(s): COLORURINE, LABSPEC, PHURINE, GLUCOSEU, HGBUR, BILIRUBINUR, KETONESUR, PROTEINUR, UROBILINOGEN, NITRITE, LEUKOCYTESUR in the last 72 hours.  Invalid input(s): APPERANCEUR    Imaging: Dg Chest Port 1 View  Result Date: 06/02/2017 CLINICAL DATA:  Acute respiratory failure EXAM: PORTABLE CHEST 1 VIEW COMPARISON:  05/31/2017 FINDINGS: Cardiac shadow is within normal limits. Left jugular central line is again seen and stable. Some increasing atelectasis  is noted in the right upper lobe along the minor fissure.  Additionally slight increase in right-sided pleural effusion is noted as well. No other focal abnormality is noted. IMPRESSION: Increasing right upper lobe atelectasis and right pleural effusion. Electronically Signed   By: Inez Catalina M.D.   On: 06/02/2017 10:21     Medications:   . sodium chloride    . sodium chloride 75 mL/hr at 06/03/17 0200  . ampicillin-sulbactam (UNASYN) IV    . phenylephrine (NEO-SYNEPHRINE) Adult infusion 90 mcg/min (06/03/17 0843)  . vancomycin    . vasopressin (PITRESSIN) infusion - *FOR SHOCK* 0.03 Units/min (06/03/17 0200)   . docusate sodium  100 mg Oral BID  . ferrous sulfate  325 mg Oral Q breakfast  . insulin aspart  0-5 Units Subcutaneous QHS  . insulin aspart  0-9 Units Subcutaneous TID WC  . magnesium oxide  400 mg Oral Daily  . nystatin   Topical TID  . pantoprazole (PROTONIX) IV  40 mg Intravenous Q12H  . simvastatin  20 mg Oral QHS  . tiotropium  18 mcg Inhalation Daily  . umeclidinium-vilanterol  1 puff Inhalation Daily  . vitamin B-12  250 mcg Oral Daily   acetaminophen **OR** acetaminophen, albuterol, heparin, morphine injection, ondansetron **OR** ondansetron (ZOFRAN) IV  Assessment/ Plan:  84 y.o. male with a PMHx of osteoarthritis, atrial fibrillation, congestive heart failure, chronic atrial for ablation, COPD, cryptogenic cirrhosis, diabetes mellitus type 2, gastric varices, history of bladder cancer status post ileal conduit, hypertension, hyperlipidemia,  who was admitted to [facility] on 05/30/2017 for evaluation of generalized weakness.  1.  Acute renal failure, possibly due to reduced PO intake, may have also been on diuretics. ANCA negative.  ANA positive with negative smith and DS-DNA, mildly positive RNP ab. Renal US negative for hydronephrosis, thinned renal parenchyma noted.  2.  Chronic kidney disease stage III baseline creatinine 1.8. 3.  Severe hyperkalemia upon admission improved with dialysis.  4.  Generalized weakness, likely  secondary to hyperkalemia. 5.  Anemia of chronic kidney disease hemoglobin 9.  Plan: Patient has become oliguric again.  Urine output only 325 cc over the preceding 24 hours.  He is on 2 pressors at the moment.  Therefore we will reinitiate the patient on continuous renal placement therapy for the next 24 hours until he has a chance to stabilize.  We will place him on a 4K bath for now.   LOS: 3 Griffey Nicasio 5/10/20199:28 AM

## 2017-06-03 NOTE — Plan of Care (Signed)
Pt has been resting comfortably most of the day. CRRT running without difficulty. Have been able to come down significantly on vasopressors with MAP sustaining >65. Daughter and wife currently at bedside.

## 2017-06-03 NOTE — Progress Notes (Signed)
[place] [facility] [place] NAME: [name]    MR#:  [id number]  DATE OF BIRTH:  03-02-1933  SUBJECTIVE:  Patient seen and evaluated today No chest pain No shortness of breath, palpitations Has generalized weakness He is on IV pressor medication to support blood pressure Currently on CRRT   REVIEW OF SYSTEMS:    ROS  CONSTITUTIONAL: No documented fever. Has fatigue, weakness. No weight gain, no weight loss.  EYES: No blurry or double vision.  ENT: No tinnitus. No postnasal drip. No redness of the oropharynx.  RESPIRATORY: No cough, no wheeze, no hemoptysis. No dyspnea.  CARDIOVASCULAR: No chest pain. No orthopnea. No palpitations. No syncope.  GASTROINTESTINAL: No nausea, no vomiting or diarrhea. No abdominal pain. No melena or hematochezia.  GENITOURINARY: No dysuria or hematuria.  ENDOCRINE: No polyuria or nocturia. No heat or cold intolerance.  HEMATOLOGY: No anemia. No bruising. No bleeding.  INTEGUMENTARY: No rashes. No lesions.  MUSCULOSKELETAL: No arthritis. No swelling. No gout.  NEUROLOGIC: No numbness, tingling, or ataxia. No seizure-type activity.  PSYCHIATRIC: No anxiety. No insomnia. No ADD.   DRUG ALLERGIES:  No Known Allergies  VITALS:  Blood pressure (!) 131/45, pulse (!) 55, temperature 98.1 F (36.7 C), temperature source Oral, resp. rate 17, height 5\' 8"  (1.727 m), weight 111.6 kg (246 lb 0.5 oz), SpO2 94 %.  PHYSICAL EXAMINATION:   Physical Exam  GENERAL:  84 y.o.-year-old patient lying in the bed . Looks critically ill EYES: Pupils equal, round, reactive to light and accommodation. No scleral icterus. Extraocular muscles intact.  HEENT: Head atraumatic, normocephalic. Oropharynx dry and nasopharynx clear.  NECK:  Supple, no jugular venous distention. No thyroid enlargement, no tenderness.  LUNGS: Bilateral crackles CARDIOVASCULAR: S1, S2 normal. No murmurs, rubs, or gallops.  ABDOMEN: Soft, nontender,  nondistended. Bowel sounds present. No organomegaly or mass.  EXTREMITIES: No cyanosis, clubbing or edema b/l.    NEUROLOGIC: Cranial nerves II through XII are intact. No focal Motor or sensory deficits b/l.   PSYCHIATRIC: The patient is alert and awake SKIN: No obvious rash, lesion, or ulcer.   LABORATORY PANEL:   CBC Recent Labs  Lab 06/03/17 0433  WBC 5.8  HGB 9.8*  HCT 28.8*  PLT 160   ------------------------------------------------------------------------------------------------------------------ Chemistries  Recent Labs  Lab 05/31/17 0008  06/03/17 0950  NA 132*   < > 137  K 7.2*   < > 5.3*  CL 104   < > 110  CO2 21*   < > 23  GLUCOSE 116*   < > 119*  BUN 56*   < > 33*  CREATININE 4.23*   < > 2.54*  CALCIUM 7.8*   < > 7.7*  MG  --    < > 2.0  AST 45*  --   --   ALT 16*  --   --   ALKPHOS 110  --   --   BILITOT 1.0  --   --    < > = values in this interval not displayed.   ------------------------------------------------------------------------------------------------------------------  Cardiac Enzymes Recent Labs  Lab 05/31/17 0008  TROPONINI <0.03   ------------------------------------------------------------------------------------------------------------------  RADIOLOGY:  Dg Chest Port 1 View  Result Date: 06/02/2017 CLINICAL DATA:  Acute respiratory failure EXAM: PORTABLE CHEST 1 VIEW COMPARISON:  05/31/2017 FINDINGS: Cardiac shadow is within normal limits. Left jugular central line is again seen and stable. Some increasing atelectasis is noted in the right upper lobe along the minor fissure. Additionally slight  increase in right-sided pleural effusion is noted as well. No other focal abnormality is noted. IMPRESSION: Increasing right upper lobe atelectasis and right pleural effusion. Electronically Signed   By: Inez Catalina M.D.   On: 06/02/2017 10:21     ASSESSMENT AND PLAN:  84 year old ill patient with history of for diabetes mellitus, COPD,  hyperlipidemia, chronic kidney disease currently in stepdown unit for hyperkalemia and worsening renal function  -Acute on chronic renal failure CKD stage III baseline On CRRT  -Hyperkalemia resolved  -GI bleed Anemia of chronic disease Status post gastroenterology evaluation Status post endoscopy - Gastritis with hemorrhage. On IV PPI  - Septic shock due to Pneumonia On IV pressors and IV abx Cx pending  -COPD stable  -Type 2 diabetes mellitus Sliding scale coverage with insulin  -DVT prophylaxis SCDs   All the records are reviewed and case discussed with Care Management/Social Worker. Management plans discussed with the patient, family and they are in agreement.  CODE STATUS: Full Code  DVT Prophylaxis: SCDs  TOTAL TIME TAKING CARE OF THIS PATIENT: 35 minutes.    [name] M.D on 06/03/2017 at 12:35 PM  Between 7am to 6pm - Pager - [phone]  After 6pm go to [email or web address] - password [email or web address] [facility]  Office  [phone]  CC: Primary care physician; Ria Bush, MD  Note: This dictation was prepared with Dragon dictation along with smaller phrase technology. Any transcriptional errors that result from this process are unintentional.

## 2017-06-03 NOTE — Progress Notes (Signed)
   06/03/17 1930  Vitals  Temp (!) 93.1 F (33.9 C)  Temp Source Axillary   Bair Hugger applied.

## 2017-06-03 NOTE — Progress Notes (Signed)
Pharmacy Antibiotic Note  [name] is a 84 y.o. male admitted on 05/30/2017 with pneumonia.  Pharmacy has been consulted for Unasyn and vancomycin dosing. Patient re-initiated on CRRT on 5/10 Coverage changed from ceftriaxone on 5/10 due to worsening chest X-ray.   Plan: Unasyn 3g IV Q6hr while on dialysis.   Vancomycin 1250mg  IV Q24hr for goal trough of 15-20. Will obtain trough prior to 3rd dose to help guide therapy.   Height: 5\' 8"  (172.7 cm) Weight: 246 lb 0.5 oz (111.6 kg) IBW/kg (Calculated) : 68.4  Temp (24hrs), Avg:97.1 F (36.2 C), Min:93.1 F (33.9 C), Max:98.6 F (37 C)  Recent Labs  Lab 05/31/17 0005 05/31/17 0008  05/31/17 2145  06/02/17 0323 06/03/17 0433 06/03/17 0950 06/03/17 1320 06/03/17 1717  WBC  --  4.4  --  4.8  --  6.9 5.8  --   --   --   CREATININE  --  4.23*   < > 2.80*   < > 2.36* 2.50* 2.54* 2.00* 1.80*  LATICACIDVEN 1.4  --   --   --   --   --   --   --   --   --    < > = values in this interval not displayed.    Estimated Creatinine Clearance: 39 mL/min (A) (by C-G formula based on SCr of 1.8 mg/dL (H)).    No Known Allergies  Antimicrobials this admission: Ceftriaxone 5/7 >> 5/9 Unasyn 5/10  >>  Vancomycin 5/10 >>  Dose adjustments this admission: N/A  Microbiology results: 5/7 BCx: no growth x 3 days  5/9 UCx: insignificant growth  5/7 MRSA PCR: negative  Thank you for allowing pharmacy to be a part of this patient's care.  Simpson,Michael L 06/03/2017 8:53 PM

## 2017-06-03 NOTE — Progress Notes (Signed)
CRRT restarted 1105. Pt resting quietly at this time. Will continue to monitor.

## 2017-06-03 NOTE — Progress Notes (Signed)
Follow up - Critical Care Medicine Note  Patient Details:    [name] is an 84 y.o. male.with acute renal failure and hyperkalemia requiring emergent hemodialysis  Lines, Airways, Drains: Urostomy (Active)  Ostomy Pouch 2 piece 06/01/2017 12:01 AM  Stoma Assessment Pink 06/01/2017 12:01 AM  Output (mL) 35 mL 05/31/2017  5:41 PM    Anti-infectives:  Anti-infectives (From admission, onward)   Start     Dose/Rate Route Frequency Ordered Stop   06/02/17 0515  cefTRIAXone (ROCEPHIN) 1 g in sodium chloride 0.9 % 100 mL IVPB  Status:  Discontinued     1 g 200 mL/hr over 30 Minutes Intravenous Every 24 hours 06/02/17 0509 06/02/17 0509   06/02/17 0515  cefTRIAXone (ROCEPHIN) 1 g in sodium chloride 0.9 % 100 mL IVPB     1 g 200 mL/hr over 30 Minutes Intravenous Daily 06/02/17 0510 06/09/17 0959   05/31/17 1200  cefTRIAXone (ROCEPHIN) 1 g in sodium chloride 0.9 % 100 mL IVPB  Status:  Discontinued     1 g 200 mL/hr over 30 Minutes Intravenous Daily 05/31/17 1156 06/02/17 0510      Microbiology: Results for orders placed or performed during the hospital encounter of 05/30/17  Culture, blood (routine x 2)     Status: None (Preliminary result)   Collection Time: 05/31/17 12:05 AM  Result Value Ref Range Status   Specimen Description BLOOD RIGHT HAND  Final   Special Requests   Final    BOTTLES DRAWN AEROBIC AND ANAEROBIC Blood Culture adequate volume   Culture   Final    NO GROWTH 3 DAYS Performed at [facility], [address] [place], [place] [address]    Report Status PENDING  Incomplete  Urine culture     Status: Abnormal   Collection Time: 05/31/17 12:05 AM  Result Value Ref Range Status   Specimen Description   Final    URINE, RANDOM Performed at [facility], [address], [place], [place] [address]    Special Requests   Final    NONE Performed at [facility], [address] [place], [place] [address]    Culture MULTIPLE SPECIES PRESENT,  SUGGEST RECOLLECTION (A)  Final   Report Status 06/01/2017 FINAL  Final  Culture, blood (routine x 2)     Status: None (Preliminary result)   Collection Time: 05/31/17 12:08 AM  Result Value Ref Range Status   Specimen Description BLOOD LEFT AC  Final   Special Requests   Final    BOTTLES DRAWN AEROBIC AND ANAEROBIC Blood Culture adequate volume   Culture   Final    NO GROWTH 3 DAYS Performed at [facility], [address] [place], [place] [address]    Report Status PENDING  Incomplete  MRSA PCR Screening     Status: None   Collection Time: 05/31/17  9:47 AM  Result Value Ref Range Status   MRSA by PCR NEGATIVE NEGATIVE Final    Comment:        The GeneXpert MRSA Assay (FDA approved for NASAL specimens only), is one component of a comprehensive MRSA colonization surveillance program. It is not intended to diagnose MRSA infection nor to guide or monitor treatment for MRSA infections. Performed at [facility], [place], [place] [address]   Events:   Studies: Ct Head Wo Contrast  Result Date: 05/31/2017 CLINICAL DATA:  Altered level of consciousness, patient fell out of bed and is hypotensive with bradycardia.  EXAM: CT HEAD WITHOUT CONTRAST TECHNIQUE: Contiguous axial images were obtained from the base of the skull through the vertex without intravenous contrast. COMPARISON:  None. FINDINGS: Brain: Remote appearing right thalamic lacunar infarct. Age related involutional changes of the brain with mild-to-moderate small vessel ischemic disease. No large vascular territory infarction, hemorrhage or midline shift. No intra-axial mass nor extra-axial fluid collections. Vascular: No hyperdense vessel sign. Skull: Intact Sinuses/Orbits: Complete opacification of the included maxillary sinuses with desiccated mucus seen within. Moderate ethmoid sinus mucosal thickening more so anteriorly. The sphenoid sinus is clear. There is right-sided frontal sinus  mucosal opacification. Intact orbits and globes. Other: None IMPRESSION: Paranasal sinus mucosal thickening in opacification as above. Remote appearing small vessel ischemic disease and right thalamic lacunar infarct. No acute intracranial abnormality. Electronically Signed   By: Ashley Royalty M.D.   On: 05/31/2017 01:47   US Renal  Result Date: 05/31/2017 CLINICAL DATA:  84 year old male with renal failure. Bladder cancer post cystectomy. Initial encounter. EXAM: RENAL / URINARY TRACT ULTRASOUND COMPLETE COMPARISON:  04/01/2016 MR. FINDINGS: Right Kidney: Length: 10.6 cm. Thin renal cortex. No hydronephrosis. Upper pole 4.7 x 4.8 x 5.5 cm cyst. Left Kidney: Length: 10.1 cm. Thin renal parenchyma. No hydronephrosis or mass. Bladder: Surgically removed. Ascites. Heterogeneous liver with lobular contour consistent with history of cirrhosis. Splenomegaly. IMPRESSION: Bilateral thin renal parenchyma. No hydronephrosis. 5.5 cm right renal cyst. Urinary bladder surgically removed. Ascites around the liver and spleen. Heterogeneous liver with lobular contour consistent with history of cirrhosis. Splenomegaly. Liver and spleen not completely assessed. Electronically Signed   By: Genia Del M.D.   On: 05/31/2017 12:31   Dg Chest Port 1 View  Result Date: 06/02/2017 CLINICAL DATA:  Acute respiratory failure EXAM: PORTABLE CHEST 1 VIEW COMPARISON:  05/31/2017 FINDINGS: Cardiac shadow is within normal limits. Left jugular central line is again seen and stable. Some increasing atelectasis is noted in the right upper lobe along the minor fissure. Additionally slight increase in right-sided pleural effusion is noted as well. No other focal abnormality is noted. IMPRESSION: Increasing right upper lobe atelectasis and right pleural effusion. Electronically Signed   By: Inez Catalina M.D.   On: 06/02/2017 10:21   Dg Chest Port 1 View  Result Date: 05/31/2017 CLINICAL DATA:  Status post central line placement EXAM: PORTABLE  CHEST 1 VIEW COMPARISON:  05/30/2017 FINDINGS: Cardiac shadow is stable. New left jugular temporary dialysis catheter is seen in satisfactory position. No pneumothorax is noted. Patchy atelectatic changes are noted throughout both lungs. No focal confluent infiltrate is seen. IMPRESSION: No pneumothorax following central line placement. Electronically Signed   By: Inez Catalina M.D.   On: 05/31/2017 13:13   Dg Chest Port 1 View  Result Date: 05/31/2017 CLINICAL DATA:  Weakness and declining health. EXAM: PORTABLE CHEST 1 VIEW COMPARISON:  None. FINDINGS: Cardiomegaly with moderate aortic atherosclerosis. Pulmonary vascular redistribution with small right effusion compatible with CHF. No acute osseous abnormality. IMPRESSION: Cardiomegaly with mild CHF and small right effusion. Electronically Signed   By: Ashley Royalty M.D.   On: 05/31/2017 00:29    Consults: Treatment Team:  Anthonette Legato, MD [name], MD   Subjective:    Overnight Issues:patient states he is not feeling well this morning. On heated high flow. Still requiring both vasopressin and Neo-Synephrine. Has been tolerating IHD. Status post EGD which revealed gastritis, no further evidence of active bleeding noted  Objective:  Vital signs for last 24 hours: Temp:  [98.6 F (37 C)-98.7  F (37.1 C)] 98.6 F (37 C) (05/10 0200) Pulse Rate:  [55-64] 62 (05/10 0700) Resp:  [13-20] 14 (05/10 0700) BP: (81-130)/(34-56) 125/45 (05/10 0700) SpO2:  [91 %-96 %] 94 % (05/10 0700) FiO2 (%):  [45 %] 45 % (05/10 0700) Weight:  [246 lb 0.5 oz (111.6 kg)] 246 lb 0.5 oz (111.6 kg) (05/10 0500)  Hemodynamic parameters for last 24 hours:    Intake/Output from previous day: 05/09 0701 - 05/10 0700 In: 3887.3 [I.V.:3787.3; IV Piggyback:100] Out: 325 [Urine:325]  Intake/Output this shift: No intake/output data recorded.  Vent settings for last 24 hours: FiO2 (%):  [45 %] 45 %  Physical Exam:  Patient is awake, alert, oriented in no acute  distress. Vital signs: Please see above list vital signs Cardiovascular: Regular rate and rhythm Pulmonary: Clear to auscultation Abdominal: Positive bowel sounds, soft exam Extremities: No clubbing or cyanosis Neurologic: No focal deficits appreciated  Assessment/Plan:   K anemia. Repeat potassium is 5.2 on HD. Acute renal failure with prior history of stage III renal disease possible precipitating events were decreased oral intake, dehydration and diuretic therapy.  Shock. Patient remains on vasopressin and Neo-Synephrine. Chest x-ray shows some asymmetrical infiltrates. Will broaden antibiotic coverage out to assume this to be septic shock  Anemia. Patient with prior history of cirrhosis, status post EGD which revealed hemorrhagic gastritis. We'll continue PPI, on Rocephin for SBP prophylaxis  Hx of CHF. Pending ECHO.   Atrial Fibrillation. Bradycardic some improvement with pulse in the 60s    Critical Care Total Time 35 minutes  Mahli Glahn 06/03/2017  *Care during the described time interval was provided by me and/or other providers on the critical care team.  I have reviewed this patient's available data, including medical history, events of note, physical examination and test results as part of my evaluation. Patient ID: [name], male   DOB: 1933-08-11, 84 y.o.   MRN: [id number] Patient ID: [name], male   DOB: [birth year], 84 y.o.   MRN: [id number]

## 2017-06-04 LAB — RENAL FUNCTION PANEL
ALBUMIN: 2.1 g/dL — AB (ref 3.5–5.0)
ALBUMIN: 2.1 g/dL — AB (ref 3.5–5.0)
ALBUMIN: 2.2 g/dL — AB (ref 3.5–5.0)
ALBUMIN: 2.2 g/dL — AB (ref 3.5–5.0)
ANION GAP: 5 (ref 5–15)
ANION GAP: 4 — AB (ref 5–15)
Albumin: 2.2 g/dL — ABNORMAL LOW (ref 3.5–5.0)
Anion gap: 4 — ABNORMAL LOW (ref 5–15)
Anion gap: 4 — ABNORMAL LOW (ref 5–15)
Anion gap: 3 — ABNORMAL LOW (ref 5–15)
BUN: 22 mg/dL — ABNORMAL HIGH (ref 6–20)
BUN: 20 mg/dL (ref 6–20)
BUN: 20 mg/dL (ref 6–20)
BUN: 17 mg/dL (ref 6–20)
BUN: 15 mg/dL (ref 6–20)
CALCIUM: 7.6 mg/dL — AB (ref 8.9–10.3)
CALCIUM: 7.8 mg/dL — AB (ref 8.9–10.3)
CHLORIDE: 105 mmol/L (ref 101–111)
CO2: 24 mmol/L (ref 22–32)
CO2: 24 mmol/L (ref 22–32)
CO2: 25 mmol/L (ref 22–32)
CO2: 25 mmol/L (ref 22–32)
CO2: 26 mmol/L (ref 22–32)
CREATININE: 1.52 mg/dL — AB (ref 0.61–1.24)
CREATININE: 1.31 mg/dL — AB (ref 0.61–1.24)
CREATININE: 1.17 mg/dL (ref 0.61–1.24)
Calcium: 7.7 mg/dL — ABNORMAL LOW (ref 8.9–10.3)
Calcium: 7.8 mg/dL — ABNORMAL LOW (ref 8.9–10.3)
Calcium: 7.9 mg/dL — ABNORMAL LOW (ref 8.9–10.3)
Chloride: 108 mmol/L (ref 101–111)
Chloride: 107 mmol/L (ref 101–111)
Chloride: 106 mmol/L (ref 101–111)
Chloride: 106 mmol/L (ref 101–111)
Creatinine, Ser: 1.26 mg/dL — ABNORMAL HIGH (ref 0.61–1.24)
Creatinine, Ser: 1.29 mg/dL — ABNORMAL HIGH (ref 0.61–1.24)
GFR calc Af Amer: 48 mL/min — ABNORMAL LOW (ref >60)
GFR calc Af Amer: 60 mL/min — ABNORMAL LOW (ref >60)
GFR calc Af Amer: 57 mL/min — ABNORMAL LOW (ref >60)
GFR calc Af Amer: 58 mL/min — ABNORMAL LOW (ref >60)
GFR calc non Af Amer: 52 mL/min — ABNORMAL LOW (ref >60)
GFR calc non Af Amer: 50 mL/min — ABNORMAL LOW (ref >60)
GFR, EST NON AFRICAN AMERICAN: 41 mL/min — AB (ref >60)
GFR, EST NON AFRICAN AMERICAN: 49 mL/min — AB (ref >60)
GFR, EST NON AFRICAN AMERICAN: 57 mL/min — AB (ref >60)
GLUCOSE: 177 mg/dL — AB (ref 65–99)
GLUCOSE: 111 mg/dL — AB (ref 65–99)
GLUCOSE: 107 mg/dL — AB (ref 65–99)
GLUCOSE: 98 mg/dL (ref 65–99)
GLUCOSE: 90 mg/dL (ref 65–99)
PHOSPHORUS: 2.5 mg/dL (ref 2.5–4.6)
PHOSPHORUS: 2.5 mg/dL (ref 2.5–4.6)
PHOSPHORUS: 2.2 mg/dL — AB (ref 2.5–4.6)
POTASSIUM: 4.6 mmol/L (ref 3.5–5.1)
POTASSIUM: 4.5 mmol/L (ref 3.5–5.1)
POTASSIUM: 4.6 mmol/L (ref 3.5–5.1)
Phosphorus: 2.8 mg/dL (ref 2.5–4.6)
Phosphorus: 2 mg/dL — ABNORMAL LOW (ref 2.5–4.6)
Potassium: 4.7 mmol/L (ref 3.5–5.1)
Potassium: 4.6 mmol/L (ref 3.5–5.1)
SODIUM: 137 mmol/L (ref 135–145)
Sodium: 133 mmol/L — ABNORMAL LOW (ref 135–145)
Sodium: 136 mmol/L (ref 135–145)
Sodium: 135 mmol/L (ref 135–145)
Sodium: 135 mmol/L (ref 135–145)

## 2017-06-04 LAB — MAGNESIUM
MAGNESIUM: 1.8 mg/dL (ref 1.7–2.4)
MAGNESIUM: 1.7 mg/dL (ref 1.7–2.4)
MAGNESIUM: 1.8 mg/dL (ref 1.7–2.4)
Magnesium: 1.7 mg/dL (ref 1.7–2.4)
Magnesium: 1.7 mg/dL (ref 1.7–2.4)

## 2017-06-04 LAB — BLOOD GAS, ARTERIAL
Acid-Base Excess: 0.5 mmol/L (ref 0.0–2.0)
Acid-base deficit: 1.3 mmol/L (ref 0.0–2.0)
BICARBONATE: 25.2 mmol/L (ref 20.0–28.0)
Bicarbonate: 26.9 mmol/L (ref 20.0–28.0)
FIO2: 50
FIO2: 50
O2 Saturation: 95.7 %
O2 Saturation: 95.5 %
PATIENT TEMPERATURE: 37
PCO2 ART: 50 mmHg — AB (ref 32.0–48.0)
PCO2 ART: 51 mmHg — AB (ref 32.0–48.0)
PH ART: 7.31 — AB (ref 7.350–7.450)
PO2 ART: 87 mmHg (ref 83.0–108.0)
PO2 ART: 84 mmHg (ref 83.0–108.0)
Patient temperature: 37
pH, Arterial: 7.33 — ABNORMAL LOW (ref 7.350–7.450)

## 2017-06-04 LAB — PROCALCITONIN: Procalcitonin: 0.17 ng/mL

## 2017-06-04 LAB — HEPATIC FUNCTION PANEL
ALBUMIN: 2.1 g/dL — AB (ref 3.5–5.0)
ALT: 14 U/L — ABNORMAL LOW (ref 17–63)
AST: 46 U/L — AB (ref 15–41)
Alkaline Phosphatase: 113 U/L (ref 38–126)
BILIRUBIN TOTAL: 1.6 mg/dL — AB (ref 0.3–1.2)
Bilirubin, Direct: 0.6 mg/dL — ABNORMAL HIGH (ref 0.1–0.5)
Indirect Bilirubin: 1 mg/dL — ABNORMAL HIGH (ref 0.3–0.9)
TOTAL PROTEIN: 5.1 g/dL — AB (ref 6.5–8.1)

## 2017-06-04 LAB — LACTIC ACID, PLASMA: Lactic Acid, Venous: 1 mmol/L (ref 0.5–1.9)

## 2017-06-04 LAB — GLUCOSE, CAPILLARY
Glucose-Capillary: 105 mg/dL — ABNORMAL HIGH (ref 65–99)
Glucose-Capillary: 97 mg/dL (ref 65–99)
Glucose-Capillary: 89 mg/dL (ref 65–99)

## 2017-06-04 LAB — TROPONIN I: TROPONIN I: 0.03 ng/mL — AB (ref <0.03)

## 2017-06-04 LAB — AMMONIA: Ammonia: 50 umol/L — ABNORMAL HIGH (ref 9–35)

## 2017-06-04 MED ORDER — SODIUM CHLORIDE 0.9% FLUSH: 10.0000 mL | INTRAVENOUS | Status: DC | PRN

## 2017-06-04 MED ORDER — SODIUM CHLORIDE 0.9% FLUSH
10.0000 mL | Freq: Two times a day (BID) | INTRAVENOUS | Status: DC
  Administered 2017-06-04 – 2017-06-16 (×15): 10 mL

## 2017-06-04 MED ORDER — AMOXICILLIN-POT CLAVULANATE 400-57 MG/5ML PO SUSR
875.0000 mg | Freq: Two times a day (BID) | ORAL | Status: DC
  Administered 2017-06-04: 872 mg via ORAL
  Filled 2017-06-04 (×4): qty 10.9

## 2017-06-04 MED ORDER — MAGNESIUM SULFATE IN D5W 1-5 GM/100ML-% IV SOLN
1.0000 g | Freq: Once | INTRAVENOUS | Status: AC
  Administered 2017-06-05: 1 g via INTRAVENOUS
  Filled 2017-06-04: qty 100

## 2017-06-04 NOTE — Progress Notes (Signed)
CRRT Medication Adjustments:  Pharmacy consulted for CRRT medication adjustments for 84 yo male re-initiated on CRRT on 5/10. Antibiotics have been adjusted. No further adjustments warranted at this time.   Pharmacy will continue to monitor and adjust per consult.   Pernell Dupre, PharmD, BCPS Clinical Pharmacist 06/04/2017 1:28 PM

## 2017-06-04 NOTE — Progress Notes (Signed)
[facility]  ROUNDING NOTE   Subjective:  Pressor requirement has come down over the preceding 24 hours. However patient only made 335 cc of urine over the preceding 24 hours. He is tolerating CRRT well at the moment.   Objective:  Vital signs in last 24 hours:  Temp:  [93.1 F (33.9 C)-98 F (36.7 C)] 95.8 F (35.4 C) (05/11 1015) Pulse Rate:  [52-107] 65 (05/11 1145) Resp:  [0-21] 18 (05/11 1145) BP: (107-140)/(36-65) 129/39 (05/11 1145) SpO2:  [91 %-96 %] 95 % (05/11 1145) FiO2 (%):  [45 %] 45 % (05/10 2300) Weight:  [114 kg (251 lb 5.2 oz)] 114 kg (251 lb 5.2 oz) (05/11 0500)  Weight change: 2.4 kg (5 lb 4.7 oz) Filed Weights   06/02/17 0500 06/03/17 0500 06/04/17 0500  Weight: 110.6 kg (243 lb 13.3 oz) 111.6 kg (246 lb 0.5 oz) 114 kg (251 lb 5.2 oz)    Intake/Output: I/O last 3 completed shifts: In: 4293.3 [P.O.:120; I.V.:4073.3; IV Piggyback:100] Out: 520 [Urine:520]   Intake/Output this shift:  Total I/O In: 570.5 [I.V.:320.5; IV Piggyback:250] Out: 0   Physical Exam: General: No acute distress  Head: Normocephalic, atraumatic. High flow nasal canula on  Eyes: Anicteric  Neck: Supple, trachea midline  Lungs:  Scattered rhonchi, normal effort  Heart: S1S2 no rubs  Abdomen:  Soft, nontender, bowel sounds present  Extremities: 2+ peripheral edema.  Neurologic: Awake, alert, following commands  Skin: No lesions  Access: Left IJ temporary dialysis catheter    Basic Metabolic Panel: Recent Labs  Lab 06/03/17 1717 06/03/17 2111 06/04/17 0121 06/04/17 0546 06/04/17 0927 06/04/17 0933  NA 135 136 133* 137 136  --   K 4.8 4.9 4.6 4.5 4.7  --   CL 108 107 105 108 107  --   CO2 24 24 24 24 25   --   GLUCOSE 112* 117* 177* 111* 107*  --   BUN 27* 25* 22* 20 20  --   CREATININE 1.80* 1.62* 1.52* 1.26* 1.31*  --   CALCIUM 7.5* 7.9* 7.7* 7.6* 7.8*  --   MG 1.9 1.8 1.8 1.7  --  1.8  PHOS 3.0 2.9 2.8 2.5 2.5  --     Liver Function  Tests: Recent Labs  Lab 05/31/17 0008  06/03/17 1717 06/03/17 2111 06/04/17 0121 06/04/17 0546 06/04/17 0927  AST 45*  --   --   --   --   --   --   ALT 16*  --   --   --   --   --   --   ALKPHOS 110  --   --   --   --   --   --   BILITOT 1.0  --   --   --   --   --   --   PROT 5.8*  --   --   --   --   --   --   ALBUMIN 2.5*   < > 2.4* 2.5* 2.1* 2.1* 2.2*   < > = values in this interval not displayed.   No results for input(s): LIPASE, AMYLASE in the last 168 hours. No results for input(s): AMMONIA in the last 168 hours.  CBC: Recent Labs  Lab 05/31/17 0008 05/31/17 2145 06/02/17 0323 06/03/17 0433  WBC 4.4 4.8 6.9 5.8  NEUTROABS 3.4  --  4.9 3.6  HGB 8.1* 8.9* 9.0* 9.8*  HCT 25.1* 27.1* 27.2* 28.8*  MCV 97.9 96.1 96.3  95.7  PLT 121* 125* 149* 160    Cardiac Enzymes: Recent Labs  Lab 05/31/17 0008 05/31/17 1026  CKTOTAL  --  99  TROPONINI <0.03  --     BNP: Invalid input(s): POCBNP  CBG: Recent Labs  Lab 06/03/17 0740 06/03/17 1130 06/03/17 1630 06/03/17 2135 06/04/17 0842  GLUCAP 112* 113* 108* 111* 105*    Microbiology: Results for orders placed or performed during the hospital encounter of 05/30/17  Culture, blood (routine x 2)     Status: None (Preliminary result)   Collection Time: 05/31/17 12:05 AM  Result Value Ref Range Status   Specimen Description BLOOD RIGHT HAND  Final   Special Requests   Final    BOTTLES DRAWN AEROBIC AND ANAEROBIC Blood Culture adequate volume   Culture   Final    NO GROWTH 4 DAYS Performed at [facility], [address], [place], [place] [address]    Report Status PENDING  Incomplete  Urine culture     Status: Abnormal   Collection Time: 05/31/17 12:05 AM  Result Value Ref Range Status   Specimen Description   Final    URINE, RANDOM Performed at [facility], [address] [place], [place] [address]    Special Requests   Final    NONE Performed at [facility], [address], [place], [place] [address]    Culture MULTIPLE SPECIES PRESENT, SUGGEST RECOLLECTION (A)  Final   Report Status 06/01/2017 FINAL  Final  Culture, blood (routine x 2)     Status: None (Preliminary result)   Collection Time: 05/31/17 12:08 AM  Result Value Ref Range Status   Specimen Description BLOOD LEFT AC  Final   Special Requests   Final    BOTTLES DRAWN AEROBIC AND ANAEROBIC Blood Culture adequate volume   Culture   Final    NO GROWTH 4 DAYS Performed at [facility], [address], [place], [place] [address]    Report Status PENDING  Incomplete  MRSA PCR Screening     Status: None   Collection Time: 05/31/17  9:47 AM  Result Value Ref Range Status   MRSA by PCR NEGATIVE NEGATIVE Final    Comment:        The GeneXpert MRSA Assay (FDA approved for NASAL specimens only), is one component of a comprehensive MRSA colonization surveillance program. It is not intended to diagnose MRSA infection nor to guide or monitor treatment for MRSA infections. Performed at [facility], [address] [place], [place] [address]   Urine Culture     Status: Abnormal   Collection Time: 06/02/17  5:27 PM  Result Value Ref Range Status   Specimen Description   Final    URINE, RANDOM Performed at [facility], [address] [place], [place] [address]    Special Requests   Final    NONE Performed at [facility], [address] [place], [place] [address]    Culture (A)  Final    <10,000 COLONIES/mL INSIGNIFICANT GROWTH Performed at [facility] [address] [place], [place] [address]    Report Status 06/03/2017 FINAL  Final    Coagulation Studies: No results for input(s): LABPROT, INR in the last 72 hours.  Urinalysis: No results for input(s): COLORURINE, LABSPEC, PHURINE, GLUCOSEU, HGBUR, BILIRUBINUR, KETONESUR, PROTEINUR, UROBILINOGEN, NITRITE, LEUKOCYTESUR in the last 72 hours.  Invalid input(s): APPERANCEUR     Imaging: No results found.   Medications:   .  sodium chloride    . sodium chloride 75 mL/hr at 06/04/17 0600  . ampicillin-sulbactam (UNASYN) IV Stopped (06/04/17 0255)  . DOPamine 2 mcg/kg/min (06/04/17 0845)  . pureflow 3 each (06/04/17 1015)  . vancomycin Stopped (06/04/17 1157)  . vasopressin (PITRESSIN) infusion - *FOR SHOCK* Stopped (06/03/17 1716)   . docusate sodium  100 mg Oral BID  . ferrous sulfate  325 mg Oral Q breakfast  . insulin aspart  0-5 Units Subcutaneous QHS  . insulin aspart  0-9 Units Subcutaneous TID WC  . magnesium oxide  400 mg Oral Daily  . nystatin   Topical TID  . pantoprazole (PROTONIX) IV  40 mg Intravenous Q12H  . simvastatin  20 mg Oral QHS  . sodium chloride flush  10-40 mL Intracatheter Q12H  . umeclidinium-vilanterol  1 puff Inhalation Daily  . vitamin B-12  250 mcg Oral Daily   acetaminophen **OR** acetaminophen, albuterol, heparin, morphine injection, ondansetron **OR** ondansetron (ZOFRAN) IV, sodium chloride flush  Assessment/ Plan:  84 y.o. male with a PMHx of osteoarthritis, atrial fibrillation, congestive heart failure, chronic atrial for ablation, COPD, cryptogenic cirrhosis, diabetes mellitus type 2, gastric varices, history of bladder cancer status post ileal conduit, hypertension, hyperlipidemia,  who was admitted to [facility] on 05/30/2017 for evaluation of generalized weakness.  1.  Acute renal failure, possibly due to reduced PO intake, may have also been on diuretics. ANCA negative.  ANA positive with negative smith and DS-DNA, mildly positive RNP ab. Renal US negative for hydronephrosis, thinned renal parenchyma noted.  2.  Chronic kidney disease stage III baseline creatinine 1.8. 3.  Severe hyperkalemia upon admission improved with dialysis.  4.  Generalized weakness at admission, likely secondary to hyperkalemia. 5.  Anemia of chronic kidney disease hemoglobin 9.8.  Plan: Urine output was only 335 cc over the preceding 24  hours.  We will maintain the patient on CRRT at this time as he continues to have a pressor requirement.  He is still requiring high flow nasal cannula as well.  Hopefully he can be weaned from this over the next several days.  Hyperkalemia has significantly improved and potassium is currently 4.7.  His anemia also appears to be stable at this time.  We will continue to monitor his progress closely.   LOS: 4 Anneke Cundy 5/11/201911:58 AM

## 2017-06-04 NOTE — Progress Notes (Signed)
[facility] [place]    MR#:  [id number]  DATE OF BIRTH:  [birth year]  SUBJECTIVE:  CHIEF COMPLAINT:   Chief Complaint  Patient presents with  . Weakness  Remains critically ill, on CRRT, case discussed with intensivist  REVIEW OF SYSTEMS:  CONSTITUTIONAL: No fever, fatigue or weakness.  EYES: No blurred or double vision.  EARS, NOSE, AND THROAT: No tinnitus or ear pain.  RESPIRATORY: No cough, shortness of breath, wheezing or hemoptysis.  CARDIOVASCULAR: No chest pain, orthopnea, edema.  GASTROINTESTINAL: No nausea, vomiting, diarrhea or abdominal pain.  GENITOURINARY: No dysuria, hematuria.  ENDOCRINE: No polyuria, nocturia,  HEMATOLOGY: No anemia, easy bruising or bleeding SKIN: No rash or lesion. MUSCULOSKELETAL: No joint pain or arthritis.   NEUROLOGIC: No tingling, numbness, weakness.  PSYCHIATRY: No anxiety or depression.   ROS  DRUG ALLERGIES:  No Known Allergies  VITALS:  Blood pressure (!) 129/39, pulse 65, temperature (!) 95.8 F (35.4 C), temperature source Axillary, resp. rate 18, height 5\' 8"  (1.727 m), weight 114 kg (251 lb 5.2 oz), SpO2 95 %.  PHYSICAL EXAMINATION:  GENERAL:  84 y.o.-year-old patient lying in the bed with no acute distress.  EYES: Pupils equal, round, reactive to light and accommodation. No scleral icterus. Extraocular muscles intact.  HEENT: Head atraumatic, normocephalic. Oropharynx and nasopharynx clear.  NECK:  Supple, no jugular venous distention. No thyroid enlargement, no tenderness.  LUNGS: Normal breath sounds bilaterally, no wheezing, rales,rhonchi or crepitation. No use of accessory muscles of respiration.  CARDIOVASCULAR: S1, S2 normal. No murmurs, rubs, or gallops.  ABDOMEN: Soft, nontender, nondistended. Bowel sounds present. No organomegaly or mass.  EXTREMITIES: No pedal edema, cyanosis, or clubbing.  NEUROLOGIC: Cranial nerves II through XII are intact. Muscle  strength 5/5 in all extremities. Sensation intact. Gait not checked.  PSYCHIATRIC: The patient is alert and oriented x 3.  SKIN: No obvious rash, lesion, or ulcer.   Physical Exam LABORATORY PANEL:   CBC Recent Labs  Lab 06/03/17 0433  WBC 5.8  HGB 9.8*  HCT 28.8*  PLT 160   ------------------------------------------------------------------------------------------------------------------  Chemistries  Recent Labs  Lab 05/31/17 0008  06/04/17 0927 06/04/17 0933  NA 132*   < > 136  --   K 7.2*   < > 4.7  --   CL 104   < > 107  --   CO2 21*   < > 25  --   GLUCOSE 116*   < > 107*  --   BUN 56*   < > 20  --   CREATININE 4.23*   < > 1.31*  --   CALCIUM 7.8*   < > 7.8*  --   MG  --    < >  --  1.8  AST 45*  --   --   --   ALT 16*  --   --   --   ALKPHOS 110  --   --   --   BILITOT 1.0  --   --   --    < > = values in this interval not displayed.   ------------------------------------------------------------------------------------------------------------------  Cardiac Enzymes Recent Labs  Lab 05/31/17 0008  TROPONINI <0.03   ------------------------------------------------------------------------------------------------------------------  RADIOLOGY:  No results found.  ASSESSMENT AND PLAN:  84 year old ill patient with history of for diabetes mellitus, COPD, hyperlipidemia, chronic kidney disease currently in stepdown unit for hyperkalemia and worsening renal function  -Acute on chronic renal failure CKD  stage III baseline On CRRT Nephrology input appreciated  -Hyperkalemia  resolved  -GI bleed Anemia of chronic disease Status post gastroenterology evaluation Status post endoscopy - Gastritis with hemorrhage Continue IV PPI  - Septic shock due to Pneumonia On IV pressors and IV abx Blood and urine Cx negtive  -COPD stable  -Type 2 diabetes mellitus Sliding scale coverage with insulin  -DVT prophylaxis SCDs   All the records are  reviewed and case discussed with Care Management/Social Workerr. Management plans discussed with the patient, family and they are in agreement.  CODE STATUS: full  TOTAL TIME TAKING CARE OF THIS PATIENT: 35 minutes.     POSSIBLE D/C IN 2-5 DAYS, DEPENDING ON CLINICAL CONDITION.   Avel Peace Michalla Ringer M.D on 06/04/2017   Between 7am to 6pm - Pager - [phone]  After 6pm go to [email or web address] - password [email or web address] [facility]  Office  [phone]  CC: Primary care physician; Ria Bush, MD  Note: This dictation was prepared with Dragon dictation along with smaller phrase technology. Any transcriptional errors that result from this process are unintentional.

## 2017-06-04 NOTE — Progress Notes (Signed)
Follow up - Critical Care Medicine Note  Patient Details:    [name] is an 84 y.o. male.with acute renal failure and hyperkalemia requiring emergent hemodialysis  Lines, Airways, Drains: Urostomy (Active)  Ostomy Pouch 2 piece 06/01/2017 12:01 AM  Stoma Assessment Pink 06/01/2017 12:01 AM  Output (mL) 35 mL 05/31/2017  5:41 PM    Anti-infectives:  Anti-infectives (From admission, onward)   Start     Dose/Rate Route Frequency Ordered Stop   06/03/17 1500  Ampicillin-Sulbactam (UNASYN) 3 g in sodium chloride 0.9 % 100 mL IVPB     3 g 200 mL/hr over 30 Minutes Intravenous Every 6 hours 06/03/17 0944     06/03/17 0945  vancomycin (VANCOCIN) 1,250 mg in sodium chloride 0.9 % 250 mL IVPB     1,250 mg 166.7 mL/hr over 90 Minutes Intravenous Daily 06/03/17 0944     06/03/17 0915  vancomycin (VANCOCIN) IVPB 1000 mg/200 mL premix  Status:  Discontinued     1,000 mg 200 mL/hr over 60 Minutes Intravenous  Once 06/03/17 0904 06/03/17 0944   06/03/17 0915  Ampicillin-Sulbactam (UNASYN) 3 g in sodium chloride 0.9 % 100 mL IVPB  Status:  Discontinued     3 g 200 mL/hr over 30 Minutes Intravenous Every 12 hours 06/03/17 0904 06/03/17 0944   06/02/17 0515  cefTRIAXone (ROCEPHIN) 1 g in sodium chloride 0.9 % 100 mL IVPB  Status:  Discontinued     1 g 200 mL/hr over 30 Minutes Intravenous Every 24 hours 06/02/17 0509 06/02/17 0509   06/02/17 0515  cefTRIAXone (ROCEPHIN) 1 g in sodium chloride 0.9 % 100 mL IVPB  Status:  Discontinued     1 g 200 mL/hr over 30 Minutes Intravenous Daily 06/02/17 0510 06/03/17 0830   05/31/17 1200  cefTRIAXone (ROCEPHIN) 1 g in sodium chloride 0.9 % 100 mL IVPB  Status:  Discontinued     1 g 200 mL/hr over 30 Minutes Intravenous Daily 05/31/17 1156 06/02/17 0510      Microbiology: Results for orders placed or performed during the hospital encounter of 05/30/17  Culture, blood (routine x 2)     Status: None (Preliminary result)   Collection Time: 05/31/17 12:05 AM   Result Value Ref Range Status   Specimen Description BLOOD RIGHT HAND  Final   Special Requests   Final    BOTTLES DRAWN AEROBIC AND ANAEROBIC Blood Culture adequate volume   Culture   Final    NO GROWTH 3 DAYS Performed at [facility], [address] [place], [place] [address]    Report Status PENDING  Incomplete  Urine culture     Status: Abnormal   Collection Time: 05/31/17 12:05 AM  Result Value Ref Range Status   Specimen Description   Final    URINE, RANDOM Performed at [facility], [address] [place], [place] [address]    Special Requests   Final    NONE Performed at [facility], [address] [place], [place] [address]    Culture MULTIPLE SPECIES PRESENT, SUGGEST RECOLLECTION (A)  Final   Report Status 06/01/2017 FINAL  Final  Culture, blood (routine x 2)     Status: None (Preliminary result)   Collection Time: 05/31/17 12:08 AM  Result Value Ref Range Status   Specimen Description BLOOD LEFT AC  Final   Special Requests   Final    BOTTLES DRAWN AEROBIC AND ANAEROBIC Blood Culture adequate volume   Culture   Final  NO GROWTH 3 DAYS Performed at [facility], [place] [place], [place] [address]    Report Status PENDING  Incomplete  MRSA PCR Screening     Status: None   Collection Time: 05/31/17  9:47 AM  Result Value Ref Range Status   MRSA by PCR NEGATIVE NEGATIVE Final    Comment:        The GeneXpert MRSA Assay (FDA approved for NASAL specimens only), is one component of a comprehensive MRSA colonization surveillance program. It is not intended to diagnose MRSA infection nor to guide or monitor treatment for MRSA infections. Performed at [facility], [address], [place], [place] [address]   Urine Culture     Status: Abnormal   Collection Time: 06/02/17  5:27 PM  Result Value Ref Range Status   Specimen Description   Final    URINE, RANDOM Performed at [facility],  [address] [place], [place] [address]    Special Requests   Final    NONE Performed at [facility], [address] [place], [place] [address]    Culture (A)  Final    <10,000 COLONIES/mL INSIGNIFICANT GROWTH Performed at [facility] [address] [place],  [address]    Report Status 06/03/2017 FINAL  Final  Events:   Studies: Ct Head Wo Contrast  Result Date: 05/31/2017 CLINICAL DATA:  Altered level of consciousness, patient fell out of bed and is hypotensive with bradycardia. EXAM: CT HEAD WITHOUT CONTRAST TECHNIQUE: Contiguous axial images were obtained from the base of the skull through the vertex without intravenous contrast. COMPARISON:  None. FINDINGS: Brain: Remote appearing right thalamic lacunar infarct. Age related involutional changes of the brain with mild-to-moderate small vessel ischemic disease. No large vascular territory infarction, hemorrhage or midline shift. No intra-axial mass nor extra-axial fluid collections. Vascular: No hyperdense vessel sign. Skull: Intact Sinuses/Orbits: Complete opacification of the included maxillary sinuses with desiccated mucus seen within. Moderate ethmoid sinus mucosal thickening more so anteriorly. The sphenoid sinus is clear. There is right-sided frontal sinus mucosal opacification. Intact orbits and globes. Other: None IMPRESSION: Paranasal sinus mucosal thickening in opacification as above. Remote appearing small vessel ischemic disease and right thalamic lacunar infarct. No acute intracranial abnormality. Electronically Signed   By: Ashley Royalty M.D.   On: 05/31/2017 01:47   US Renal  Result Date: 05/31/2017 CLINICAL DATA:  84 year old male with renal failure. Bladder cancer post cystectomy. Initial encounter. EXAM: RENAL / URINARY TRACT ULTRASOUND COMPLETE COMPARISON:  04/01/2016 MR. FINDINGS: Right Kidney: Length: 10.6 cm. Thin renal cortex. No hydronephrosis. Upper pole 4.7 x 4.8 x 5.5 cm cyst. Left Kidney:  Length: 10.1 cm. Thin renal parenchyma. No hydronephrosis or mass. Bladder: Surgically removed. Ascites. Heterogeneous liver with lobular contour consistent with history of cirrhosis. Splenomegaly. IMPRESSION: Bilateral thin renal parenchyma. No hydronephrosis. 5.5 cm right renal cyst. Urinary bladder surgically removed. Ascites around the liver and spleen. Heterogeneous liver with lobular contour consistent with history of cirrhosis. Splenomegaly. Liver and spleen not completely assessed. Electronically Signed   By: Genia Del M.D.   On: 05/31/2017 12:31   Dg Chest Port 1 View  Result Date: 06/02/2017 CLINICAL DATA:  Acute respiratory failure EXAM: PORTABLE CHEST 1 VIEW COMPARISON:  05/31/2017 FINDINGS: Cardiac shadow is within normal limits. Left jugular central line is again seen and stable. Some increasing atelectasis is noted in the right upper lobe along the minor fissure. Additionally slight increase in right-sided pleural effusion is noted  as well. No other focal abnormality is noted. IMPRESSION: Increasing right upper lobe atelectasis and right pleural effusion. Electronically Signed   By: Inez Catalina M.D.   On: 06/02/2017 10:21   Dg Chest Port 1 View  Result Date: 05/31/2017 CLINICAL DATA:  Status post central line placement EXAM: PORTABLE CHEST 1 VIEW COMPARISON:  05/30/2017 FINDINGS: Cardiac shadow is stable. New left jugular temporary dialysis catheter is seen in satisfactory position. No pneumothorax is noted. Patchy atelectatic changes are noted throughout both lungs. No focal confluent infiltrate is seen. IMPRESSION: No pneumothorax following central line placement. Electronically Signed   By: Inez Catalina M.D.   On: 05/31/2017 13:13   Dg Chest Port 1 View  Result Date: 05/31/2017 CLINICAL DATA:  Weakness and declining health. EXAM: PORTABLE CHEST 1 VIEW COMPARISON:  None. FINDINGS: Cardiomegaly with moderate aortic atherosclerosis. Pulmonary vascular redistribution with small right  effusion compatible with CHF. No acute osseous abnormality. IMPRESSION: Cardiomegaly with mild CHF and small right effusion. Electronically Signed   By: Ashley Royalty M.D.   On: 05/31/2017 00:29    Consults: Treatment Team:  Anthonette Legato, MD   Subjective:    Overnight Issues:patient's pressors were changed to dopamine secondary to bradycardia. Presently on 3 mics of dopamine with stable hemodynamics. He still is somewhat groggy but easily arousable  Objective:  Vital signs for last 24 hours: Temp:  [93.1 F (33.9 C)-98.1 F (36.7 C)] 97.5 F (36.4 C) (05/11 0000) Pulse Rate:  [52-107] 66 (05/11 0800) Resp:  [0-21] 18 (05/11 0800) BP: (112-140)/(39-65) 127/52 (05/11 0800) SpO2:  [91 %-96 %] 94 % (05/11 0800) FiO2 (%):  [45 %] 45 % (05/10 2300) Weight:  [251 lb 5.2 oz (114 kg)] 251 lb 5.2 oz (114 kg) (05/11 0500)  Hemodynamic parameters for last 24 hours:    Intake/Output from previous day: 05/10 0701 - 05/11 0700 In: 2896.6 [P.O.:120; I.V.:2676.6; IV Piggyback:100] Out: 335 [Urine:335]  Intake/Output this shift: Total I/O In: 81.3 [I.V.:81.3] Out: -   Vent settings for last 24 hours: FiO2 (%):  [45 %] 45 %  Physical Exam:  Patient is no oriented in no acute distress. Vital signs: Please see above list vital signs Cardiovascular: Regular rate and rhythm Pulmonary: Clear to auscultation Abdominal: Positive bowel sounds, soft exam Extremities: No clubbing or cyanosis Neurologic: No focal deficits appreciated  Assessment/Plan:   Hyperkalemia. Has been placed on CRRT secondary to hemodynamic compromise. Potassium is 4.5.   Shock. Patient was converted to dopamine secondary to bradycardia and hypotension. On broad-spectrum antibiotic coverage for right-sided pneumonia To include Unasyn and vancomycin  Anemia. Patient with prior history of cirrhosis, status post EGD which revealed hemorrhagic gastritis. We'll continue PPI,   Atrial Fibrillation. Bradycardic some  improvement with pulse in the 60s   [name] 06/04/2017  *Care during the described time interval was provided by me and/or other providers on the critical care team.  I have reviewed this patient's available data, including medical history, events of note, physical examination and test results as part of my evaluation. Patient ID: [name], male   DOB: January 01, 1934, 84 y.o.   MRN: [id number] Patient ID: [name], male   DOB: 1934-01-18, 84 y.o.   MRN: [id number] Patient ID: [name], male   DOB: 1933-02-08, 84 y.o.   MRN: [id number]

## 2017-06-04 NOTE — Progress Notes (Signed)
RN spoke with Dr. Jefferson Fuel and made MD aware that patient is very drowsy, unable to take meds this morning.  Patient arouses to voice and follows commands, very congested non-productive cough.  MD gave order to stop IVF and to order blood gas.

## 2017-06-04 NOTE — Progress Notes (Signed)
Temperature will not register oral nor axillary therefore bair hugger temperature increased.

## 2017-06-04 NOTE — Progress Notes (Signed)
Labs sent

## 2017-06-04 NOTE — Progress Notes (Signed)
Chart reviewed. Pt has been lethargic and mostly unable to take PO's. Spoke with Nsg who reports the few sips Pt takes, he tolerates. Will f/u when more alert. Nsg agreed.

## 2017-06-04 NOTE — Progress Notes (Signed)
This RN discussed with Dr. Jefferson Fuel abg results and MD is aware of pH of 7.31 and PCo2 of 50.  MD stated he has assessed patient and that patient is the same as yesterday.  MD acknowledged and gave no new orders.

## 2017-06-05 ENCOUNTER — Inpatient Hospital Stay: Payer: PPO

## 2017-06-05 LAB — RENAL FUNCTION PANEL
ALBUMIN: 2.2 g/dL — AB (ref 3.5–5.0)
ALBUMIN: 2.1 g/dL — AB (ref 3.5–5.0)
ANION GAP: 4 — AB (ref 5–15)
ANION GAP: 3 — AB (ref 5–15)
Albumin: 2.2 g/dL — ABNORMAL LOW (ref 3.5–5.0)
Albumin: 2.1 g/dL — ABNORMAL LOW (ref 3.5–5.0)
Albumin: 2.1 g/dL — ABNORMAL LOW (ref 3.5–5.0)
Anion gap: 2 — ABNORMAL LOW (ref 5–15)
Anion gap: 4 — ABNORMAL LOW (ref 5–15)
Anion gap: 4 — ABNORMAL LOW (ref 5–15)
BUN: 15 mg/dL (ref 6–20)
BUN: 13 mg/dL (ref 6–20)
BUN: 11 mg/dL (ref 6–20)
BUN: 11 mg/dL (ref 6–20)
BUN: 11 mg/dL (ref 6–20)
CALCIUM: 7.8 mg/dL — AB (ref 8.9–10.3)
CALCIUM: 7.7 mg/dL — AB (ref 8.9–10.3)
CALCIUM: 8 mg/dL — AB (ref 8.9–10.3)
CHLORIDE: 106 mmol/L (ref 101–111)
CHLORIDE: 105 mmol/L (ref 101–111)
CHLORIDE: 106 mmol/L (ref 101–111)
CO2: 26 mmol/L (ref 22–32)
CO2: 27 mmol/L (ref 22–32)
CO2: 25 mmol/L (ref 22–32)
CO2: 27 mmol/L (ref 22–32)
CO2: 27 mmol/L (ref 22–32)
CREATININE: 1.06 mg/dL (ref 0.61–1.24)
Calcium: 7.9 mg/dL — ABNORMAL LOW (ref 8.9–10.3)
Calcium: 7.8 mg/dL — ABNORMAL LOW (ref 8.9–10.3)
Chloride: 106 mmol/L (ref 101–111)
Chloride: 106 mmol/L (ref 101–111)
Creatinine, Ser: 1.23 mg/dL (ref 0.61–1.24)
Creatinine, Ser: 1.21 mg/dL (ref 0.61–1.24)
Creatinine, Ser: 1.05 mg/dL (ref 0.61–1.24)
Creatinine, Ser: 0.96 mg/dL (ref 0.61–1.24)
GFR calc Af Amer: >60 mL/min (ref >60)
GFR calc non Af Amer: >60 mL/min (ref >60)
GFR calc non Af Amer: >60 mL/min (ref >60)
GFR, EST NON AFRICAN AMERICAN: 53 mL/min — AB (ref >60)
GFR, EST NON AFRICAN AMERICAN: 54 mL/min — AB (ref >60)
GLUCOSE: 85 mg/dL (ref 65–99)
Glucose, Bld: 96 mg/dL (ref 65–99)
Glucose, Bld: 95 mg/dL (ref 65–99)
Glucose, Bld: 87 mg/dL (ref 65–99)
Glucose, Bld: 78 mg/dL (ref 65–99)
PHOSPHORUS: 1.8 mg/dL — AB (ref 2.5–4.6)
PHOSPHORUS: 2.6 mg/dL (ref 2.5–4.6)
PHOSPHORUS: 2.2 mg/dL — AB (ref 2.5–4.6)
POTASSIUM: 4.5 mmol/L (ref 3.5–5.1)
POTASSIUM: 4.3 mmol/L (ref 3.5–5.1)
Phosphorus: 2 mg/dL — ABNORMAL LOW (ref 2.5–4.6)
Phosphorus: 2.3 mg/dL — ABNORMAL LOW (ref 2.5–4.6)
Potassium: 4.4 mmol/L (ref 3.5–5.1)
Potassium: 4.3 mmol/L (ref 3.5–5.1)
Potassium: 4.4 mmol/L (ref 3.5–5.1)
SODIUM: 135 mmol/L (ref 135–145)
SODIUM: 136 mmol/L (ref 135–145)
Sodium: 136 mmol/L (ref 135–145)
Sodium: 135 mmol/L (ref 135–145)
Sodium: 136 mmol/L (ref 135–145)

## 2017-06-05 LAB — CULTURE, BLOOD (ROUTINE X 2)
Culture: NO GROWTH
Culture: NO GROWTH
SPECIAL REQUESTS: ADEQUATE
Special Requests: ADEQUATE

## 2017-06-05 LAB — MAGNESIUM
MAGNESIUM: 1.9 mg/dL (ref 1.7–2.4)
MAGNESIUM: 1.8 mg/dL (ref 1.7–2.4)
MAGNESIUM: 1.7 mg/dL (ref 1.7–2.4)
Magnesium: 1.8 mg/dL (ref 1.7–2.4)
Magnesium: 1.7 mg/dL (ref 1.7–2.4)

## 2017-06-05 LAB — CBC WITH DIFFERENTIAL/PLATELET
BASOS ABS: 0 10*3/uL (ref 0–0.1)
BASOS PCT: 0 %
Eosinophils Absolute: 0.2 10*3/uL (ref 0–0.7)
Eosinophils Relative: 5 %
HCT: 26.2 % — ABNORMAL LOW (ref 40.0–52.0)
Hemoglobin: 8.7 g/dL — ABNORMAL LOW (ref 13.0–18.0)
Lymphocytes Relative: 13 %
Lymphs Abs: 0.5 10*3/uL — ABNORMAL LOW (ref 1.0–3.6)
MCH: 31 pg (ref 26.0–34.0)
MCHC: 33.1 g/dL (ref 32.0–36.0)
MCV: 93.7 fL (ref 80.0–100.0)
MONO ABS: 0.5 10*3/uL (ref 0.2–1.0)
Monocytes Relative: 12 %
Neutro Abs: 2.5 10*3/uL (ref 1.4–6.5)
Neutrophils Relative %: 70 %
Platelets: 73 10*3/uL — ABNORMAL LOW (ref 150–440)
RBC: 2.8 MIL/uL — AB (ref 4.40–5.90)
RDW: 16.1 % — AB (ref 11.5–14.5)
WBC: 3.7 10*3/uL — AB (ref 3.8–10.6)

## 2017-06-05 LAB — GLUCOSE, CAPILLARY
Glucose-Capillary: 78 mg/dL (ref 65–99)
Glucose-Capillary: 80 mg/dL (ref 65–99)
Glucose-Capillary: 83 mg/dL (ref 65–99)
Glucose-Capillary: 84 mg/dL (ref 65–99)
Glucose-Capillary: 86 mg/dL (ref 65–99)

## 2017-06-05 MED ORDER — POTASSIUM & SODIUM PHOSPHATES 280-160-250 MG PO PACK
2.0000 | PACK | Freq: Three times a day (TID) | ORAL | Status: DC
  Filled 2017-06-05 (×2): qty 2

## 2017-06-05 MED ORDER — SODIUM CHLORIDE 0.9 % IV SOLN
3.0000 g | Freq: Four times a day (QID) | INTRAVENOUS | Status: DC
  Administered 2017-06-05 – 2017-06-07 (×6): 3 g via INTRAVENOUS
  Filled 2017-06-05 (×9): qty 3

## 2017-06-05 MED ORDER — SODIUM CHLORIDE 0.9 % IV SOLN
20.0000 mmol | Freq: Once | INTRAVENOUS | Status: AC
  Administered 2017-06-05: 20 mmol via INTRAVENOUS
  Filled 2017-06-05: qty 20

## 2017-06-05 NOTE — Progress Notes (Signed)
[place] [facility] [place]    MR#:  [id number]  DATE OF BIRTH:  1933-05-31  SUBJECTIVE:  CHIEF COMPLAINT:   Chief Complaint  Patient presents with  . Weakness  Case discussed with intensivist-patient remains quite ill, still requiring dobutamine drip, nephrology input appreciated  REVIEW OF SYSTEMS:  CONSTITUTIONAL: No fever, fatigue or weakness.  EYES: No blurred or double vision.  EARS, NOSE, AND THROAT: No tinnitus or ear pain.  RESPIRATORY: No cough, shortness of breath, wheezing or hemoptysis.  CARDIOVASCULAR: No chest pain, orthopnea, edema.  GASTROINTESTINAL: No nausea, vomiting, diarrhea or abdominal pain.  GENITOURINARY: No dysuria, hematuria.  ENDOCRINE: No polyuria, nocturia,  HEMATOLOGY: No anemia, easy bruising or bleeding SKIN: No rash or lesion. MUSCULOSKELETAL: No joint pain or arthritis.   NEUROLOGIC: No tingling, numbness, weakness.  PSYCHIATRY: No anxiety or depression.   ROS  DRUG ALLERGIES:  No Known Allergies  VITALS:  Blood pressure (!) 116/41, pulse 64, temperature (!) 97.4 F (36.3 C), temperature source Axillary, resp. rate 14, height 5\' 8"  (1.727 m), weight 114.9 kg (253 lb 4.9 oz), SpO2 98 %.  PHYSICAL EXAMINATION:  GENERAL:  84 y.o.-year-old patient lying in the bed with no acute distress.  EYES: Pupils equal, round, reactive to light and accommodation. No scleral icterus. Extraocular muscles intact.  HEENT: Head atraumatic, normocephalic. Oropharynx and nasopharynx clear.  NECK:  Supple, no jugular venous distention. No thyroid enlargement, no tenderness.  LUNGS: Normal breath sounds bilaterally, no wheezing, rales,rhonchi or crepitation. No use of accessory muscles of respiration.  CARDIOVASCULAR: S1, S2 normal. No murmurs, rubs, or gallops.  ABDOMEN: Soft, nontender, nondistended. Bowel sounds present. No organomegaly or mass.  EXTREMITIES: No pedal edema, cyanosis, or clubbing.   NEUROLOGIC: Cranial nerves II through XII are intact. Muscle strength 5/5 in all extremities. Sensation intact. Gait not checked.  PSYCHIATRIC: The patient is alert and oriented x 3.  SKIN: No obvious rash, lesion, or ulcer.   Physical Exam LABORATORY PANEL:   CBC Recent Labs  Lab 06/03/17 0433  WBC 5.8  HGB 9.8*  HCT 28.8*  PLT 160   ------------------------------------------------------------------------------------------------------------------  Chemistries  Recent Labs  Lab 06/04/17 2140  06/05/17 0413  NA 135   < > 135  K 4.6   < > 4.4  CL 106   < > 106  CO2 26   < > 27  GLUCOSE 90   < > 95  BUN 15   < > 13  CREATININE 1.17   < > 1.21  CALCIUM 7.9*   < > 7.8*  MG 1.7   < > 1.8  AST 46*  --   --   ALT 14*  --   --   ALKPHOS 113  --   --   BILITOT 1.6*  --   --    < > = values in this interval not displayed.   ------------------------------------------------------------------------------------------------------------------  Cardiac Enzymes Recent Labs  Lab 05/31/17 0008 06/04/17 2140  TROPONINI <0.03 0.03*   ------------------------------------------------------------------------------------------------------------------  RADIOLOGY:  US Venous Img Upper Uni Right  Result Date: 06/05/2017 CLINICAL DATA:  84 year old male with a history of right upper arm swelling EXAM: RIGHT UPPER EXTREMITY VENOUS DOPPLER ULTRASOUND TECHNIQUE: Gray-scale sonography with graded compression, as well as color Doppler and duplex ultrasound were performed to evaluate the upper extremity deep venous system from the level of the subclavian vein and including the jugular, axillary, basilic, radial, ulnar and upper cephalic vein. Spectral  Doppler was utilized to evaluate flow at rest and with distal augmentation maneuvers. COMPARISON:  None. FINDINGS: Contralateral Subclavian Vein: Respiratory phasicity is normal and symmetric with the symptomatic side. No evidence of thrombus. Normal  compressibility. Internal Jugular Vein: No evidence of thrombus. Normal compressibility, respiratory phasicity and response to augmentation. Subclavian Vein: No evidence of thrombus. Normal compressibility, respiratory phasicity and response to augmentation. Axillary Vein: No evidence of thrombus. Normal compressibility, respiratory phasicity and response to augmentation. Cephalic Vein: No evidence of thrombus. Normal compressibility, respiratory phasicity and response to augmentation. Basilic Vein: No evidence of thrombus. Normal compressibility, respiratory phasicity and response to augmentation. Brachial Veins: No evidence of thrombus. Normal compressibility, respiratory phasicity and response to augmentation. Radial Veins: No evidence of thrombus. Normal compressibility, respiratory phasicity and response to augmentation. Ulnar Veins: No evidence of thrombus. Normal compressibility, respiratory phasicity and response to augmentation. Other Findings:  None visualized. IMPRESSION: Sonographic survey of the right upper extremity negative for DVT Electronically Signed   By: Corrie Mckusick D.O.   On: 06/05/2017 11:22   Dg Chest Port 1 View  Result Date: 06/05/2017 CLINICAL DATA:  Acute respiratory failure, history CHF EXAM: PORTABLE CHEST 1 VIEW COMPARISON:  Portable exam 0501 hours compared to 06/02/2017 FINDINGS: LEFT jugular line with tip projecting over SVC. Enlargement of cardiac silhouette with pulmonary vascular congestion. Atherosclerotic calcification aorta. Pericardial calcification again identified. Increased RIGHT pleural effusion and mild RIGHT basilar atelectasis. Diffuse interstitial infiltrates favor pulmonary edema. No pneumothorax. IMPRESSION: Persistent pulmonary edema. Increased RIGHT pleural effusion and mild RIGHT basilar atelectasis. Electronically Signed   By: Lavonia Dana M.D.   On: 06/05/2017 07:14    ASSESSMENT AND PLAN:  84 year old ill patient with history of for diabetes mellitus,  COPD, hyperlipidemia, chronic kidney disease currently in stepdown unit for hyperkalemia and worsening renal function  -Acute on chronic renal failure CKD stage III baseline, baseline creatinine 1.8 Continue CRRT Nephrology input appreciated  -Hyperkalemia  resolved with hemodialysis  -GI bleed Anemia of chronic disease Status post gastroenterology evaluation Status post endoscopy - Gastritis with hemorrhage ContinueIV PPI  -Acute septic shock due to Pneumonia Continue pressor support with weaning as tolerated, vancomycin/Unasyn  Blood and urineCxnegtive  -COPD  stable  -Type 2 diabetes mellitus Stable on current regiment  All the records are reviewed and case discussed with Care Management/Social Workerr. Management plans discussed with the patient, family and they are in agreement.  CODE STATUS: full  TOTAL TIME TAKING CARE OF THIS PATIENT: 35 minutes.     POSSIBLE D/C IN 2-5 DAYS, DEPENDING ON CLINICAL CONDITION.   Avel Peace Daneesha Quinteros M.D on 06/05/2017   Between 7am to 6pm - Pager - [phone]  After 6pm go to [email or web address] - password [email or web address] [facility]  Office  [phone]  CC: Primary care physician; Ria Bush, MD  Note: This dictation was prepared with Dragon dictation along with smaller phrase technology. Any transcriptional errors that result from this process are unintentional.

## 2017-06-05 NOTE — Progress Notes (Signed)
Stable day. Drowsy this am but more alert during bath. Edematous all over body but especially in the scrotal area and right  arm. Doppler studies of the right arm showed no DVT.  During bath moved all extremities when asked. Actually joked with nurses. Remains confused but pleasant.Remains on CRRT.

## 2017-06-05 NOTE — Progress Notes (Signed)
[facility]  ROUNDING NOTE   Subjective:  Patient seen at bedside. Very little urine output out of his urostomy yesterday. Continues on CRRT at this time.   Objective:  Vital signs in last 24 hours:  Temp:  [97.4 F (36.3 C)-98.2 F (36.8 C)] 97.5 F (36.4 C) (05/12 1200) Pulse Rate:  [62-86] 63 (05/12 1300) Resp:  [14-29] 15 (05/12 1300) BP: (102-144)/(36-88) 102/83 (05/12 1300) SpO2:  [92 %-98 %] 97 % (05/12 1300) FiO2 (%):  [45 %] 45 % (05/12 0400) Weight:  [114.9 kg (253 lb 4.9 oz)] 114.9 kg (253 lb 4.9 oz) (05/12 0500)  Weight change: 0.9 kg (1 lb 15.8 oz) Filed Weights   06/03/17 0500 06/04/17 0500 06/05/17 0500  Weight: 111.6 kg (246 lb 0.5 oz) 114 kg (251 lb 5.2 oz) 114.9 kg (253 lb 4.9 oz)    Intake/Output: I/O last 3 completed shifts: In: 2146.8 [P.O.:150; I.V.:1646.8; IV Piggyback:350] Out: 275 [Urine:275]   Intake/Output this shift:  Total I/O In: 34 [IV Piggyback:34] Out: 130 [Urine:130]  Physical Exam: General: No acute distress  Head: Normocephalic, atraumatic. High flow nasal canula on  Eyes: Anicteric  Neck: Supple, trachea midline  Lungs:  Scattered rhonchi, normal effort  Heart: S1S2 no rubs  Abdomen:  Soft, nontender, bowel sounds present, urostomy in place  Extremities: 2+ peripheral edema.  Neurologic: Lethargic but arousable  Skin: No lesions  Access: Left IJ temporary dialysis catheter    Basic Metabolic Panel: Recent Labs  Lab 06/04/17 0927 06/04/17 0933 06/04/17 1609 06/04/17 2140 06/05/17 0202 06/05/17 0413  NA 136  --  135 135 136 135  K 4.7  --  4.6 4.6 4.5 4.4  CL 107  --  106 106 106 106  CO2 25  --  25 26 26 27   GLUCOSE 107*  --  98 90 96 95  BUN 20  --  17 15 15 13   CREATININE 1.31*  --  1.29* 1.17 1.23 1.21  CALCIUM 7.8*  --  7.8* 7.9* 7.9* 7.8*  MG  --  1.8 1.7 1.7 1.9 1.8  PHOS 2.5  --  2.2* 2.0* 2.0* 1.8*    Liver Function Tests: Recent Labs  Lab 05/31/17 0008  06/04/17 0927 06/04/17 1609  06/04/17 2140 06/05/17 0202 06/05/17 0413  AST 45*  --   --   --  46*  --   --   ALT 16*  --   --   --  14*  --   --   ALKPHOS 110  --   --   --  113  --   --   BILITOT 1.0  --   --   --  1.6*  --   --   PROT 5.8*  --   --   --  5.1*  --   --   ALBUMIN 2.5*   < > 2.2* 2.2* 2.1*  2.2* 2.2* 2.2*   < > = values in this interval not displayed.   No results for input(s): LIPASE, AMYLASE in the last 168 hours. Recent Labs  Lab 06/04/17 2140  AMMONIA 50*    CBC: Recent Labs  Lab 05/31/17 0008 05/31/17 2145 06/02/17 0323 06/03/17 0433  WBC 4.4 4.8 6.9 5.8  NEUTROABS 3.4  --  4.9 3.6  HGB 8.1* 8.9* 9.0* 9.8*  HCT 25.1* 27.1* 27.2* 28.8*  MCV 97.9 96.1 96.3 95.7  PLT 121* 125* 149* 160    Cardiac Enzymes: Recent Labs  Lab 05/31/17 0008  05/31/17 1026 06/04/17 2140  CKTOTAL  --  99  --   TROPONINI <0.03  --  0.03*    BNP: Invalid input(s): POCBNP  CBG: Recent Labs  Lab 06/04/17 1258 06/04/17 1605 06/05/17 0003 06/05/17 0811 06/05/17 1243  GLUCAP 97 89 83 84 34    Microbiology: Results for orders placed or performed during the hospital encounter of 05/30/17  Culture, blood (routine x 2)     Status: None   Collection Time: 05/31/17 12:05 AM  Result Value Ref Range Status   Specimen Description BLOOD RIGHT HAND  Final   Special Requests   Final    BOTTLES DRAWN AEROBIC AND ANAEROBIC Blood Culture adequate volume   Culture   Final    NO GROWTH 5 DAYS Performed at [facility], [address], [place], [place] [address]    Report Status 06/05/2017 FINAL  Final  Urine culture     Status: Abnormal   Collection Time: 05/31/17 12:05 AM  Result Value Ref Range Status   Specimen Description   Final    URINE, RANDOM Performed at [facility], [address], [place], [place] [address]    Special Requests   Final    NONE Performed at [facility], [address] [place], [place] [address]    Culture MULTIPLE SPECIES PRESENT,  SUGGEST RECOLLECTION (A)  Final   Report Status 06/01/2017 FINAL  Final  Culture, blood (routine x 2)     Status: None   Collection Time: 05/31/17 12:08 AM  Result Value Ref Range Status   Specimen Description BLOOD LEFT AC  Final   Special Requests   Final    BOTTLES DRAWN AEROBIC AND ANAEROBIC Blood Culture adequate volume   Culture   Final    NO GROWTH 5 DAYS Performed at [facility], [address] [place], [place] [address]    Report Status 06/05/2017 FINAL  Final  MRSA PCR Screening     Status: None   Collection Time: 05/31/17  9:47 AM  Result Value Ref Range Status   MRSA by PCR NEGATIVE NEGATIVE Final    Comment:        The GeneXpert MRSA Assay (FDA approved for NASAL specimens only), is one component of a comprehensive MRSA colonization surveillance program. It is not intended to diagnose MRSA infection nor to guide or monitor treatment for MRSA infections. Performed at [facility], [address], [place], [place] [address]   Urine Culture     Status: Abnormal   Collection Time: 06/02/17  5:27 PM  Result Value Ref Range Status   Specimen Description   Final    URINE, RANDOM Performed at [facility], [address], [place], [place] [address]    Special Requests   Final    NONE Performed at [facility], [address] [place], [place] [address]    Culture (A)  Final    <10,000 COLONIES/mL INSIGNIFICANT GROWTH Performed at [facility] [address], [place], [place] [address]    Report Status 06/03/2017 FINAL  Final    Coagulation Studies: No results for input(s): LABPROT, INR in the last 72 hours.  Urinalysis: No results for input(s): COLORURINE, LABSPEC, PHURINE, GLUCOSEU, HGBUR, BILIRUBINUR, KETONESUR, PROTEINUR, UROBILINOGEN, NITRITE, LEUKOCYTESUR in the last 72 hours.  Invalid input(s): APPERANCEUR    Imaging: US Venous Img Upper Uni Right  Result Date: 06/05/2017 CLINICAL DATA:  84 year old  male with a history of right upper arm  swelling EXAM: RIGHT UPPER EXTREMITY VENOUS DOPPLER ULTRASOUND TECHNIQUE: Gray-scale sonography with graded compression, as well as color Doppler and duplex ultrasound were performed to evaluate the upper extremity deep venous system from the level of the subclavian vein and including the jugular, axillary, basilic, radial, ulnar and upper cephalic vein. Spectral Doppler was utilized to evaluate flow at rest and with distal augmentation maneuvers. COMPARISON:  None. FINDINGS: Contralateral Subclavian Vein: Respiratory phasicity is normal and symmetric with the symptomatic side. No evidence of thrombus. Normal compressibility. Internal Jugular Vein: No evidence of thrombus. Normal compressibility, respiratory phasicity and response to augmentation. Subclavian Vein: No evidence of thrombus. Normal compressibility, respiratory phasicity and response to augmentation. Axillary Vein: No evidence of thrombus. Normal compressibility, respiratory phasicity and response to augmentation. Cephalic Vein: No evidence of thrombus. Normal compressibility, respiratory phasicity and response to augmentation. Basilic Vein: No evidence of thrombus. Normal compressibility, respiratory phasicity and response to augmentation. Brachial Veins: No evidence of thrombus. Normal compressibility, respiratory phasicity and response to augmentation. Radial Veins: No evidence of thrombus. Normal compressibility, respiratory phasicity and response to augmentation. Ulnar Veins: No evidence of thrombus. Normal compressibility, respiratory phasicity and response to augmentation. Other Findings:  None visualized. IMPRESSION: Sonographic survey of the right upper extremity negative for DVT Electronically Signed   By: Corrie Mckusick D.O.   On: 06/05/2017 11:22   Dg Chest Port 1 View  Result Date: 06/05/2017 CLINICAL DATA:  Acute respiratory failure, history CHF EXAM: PORTABLE CHEST 1 VIEW COMPARISON:  Portable  exam 0501 hours compared to 06/02/2017 FINDINGS: LEFT jugular line with tip projecting over SVC. Enlargement of cardiac silhouette with pulmonary vascular congestion. Atherosclerotic calcification aorta. Pericardial calcification again identified. Increased RIGHT pleural effusion and mild RIGHT basilar atelectasis. Diffuse interstitial infiltrates favor pulmonary edema. No pneumothorax. IMPRESSION: Persistent pulmonary edema. Increased RIGHT pleural effusion and mild RIGHT basilar atelectasis. Electronically Signed   By: Lavonia Dana M.D.   On: 06/05/2017 07:14     Medications:   . sodium chloride    . DOPamine 4.014 mcg/kg/min (06/05/17 0500)  . pureflow 3 each (06/05/17 1000)  . sodium glycerophosphate 0.9% NaCl IVPB 20 mmol (06/05/17 0832)  . vancomycin Stopped (06/05/17 1243)  . vasopressin (PITRESSIN) infusion - *FOR SHOCK* Stopped (06/03/17 1716)   . amoxicillin-clavulanate  875 mg of amoxicillin Oral Q12H  . docusate sodium  100 mg Oral BID  . ferrous sulfate  325 mg Oral Q breakfast  . insulin aspart  0-5 Units Subcutaneous QHS  . insulin aspart  0-9 Units Subcutaneous TID WC  . magnesium oxide  400 mg Oral Daily  . nystatin   Topical TID  . pantoprazole (PROTONIX) IV  40 mg Intravenous Q12H  . simvastatin  20 mg Oral QHS  . sodium chloride flush  10-40 mL Intracatheter Q12H  . umeclidinium-vilanterol  1 puff Inhalation Daily  . vitamin B-12  250 mcg Oral Daily   acetaminophen **OR** acetaminophen, albuterol, heparin, morphine injection, ondansetron **OR** ondansetron (ZOFRAN) IV, sodium chloride flush  Assessment/ Plan:  84 y.o. male with a PMHx of osteoarthritis, atrial fibrillation, congestive heart failure, chronic atrial for ablation, COPD, cryptogenic cirrhosis, diabetes mellitus type 2, gastric varices, history of bladder cancer status post ileal conduit, hypertension, hyperlipidemia,  who was admitted to [facility] on 05/30/2017 for evaluation of generalized weakness.  1.   Acute renal failure, possibly due to reduced PO intake, may have also been on diuretics. SPEP/UPEP/ANCA negative.  ANA positive with negative smith and DS-DNA, mildly positive RNP ab.  Renal US negative for hydronephrosis, thinned renal parenchyma noted.  2.  Chronic kidney disease stage III baseline creatinine 1.8. 3.  Severe hyperkalemia upon admission improved with dialysis.  4.  Generalized weakness at admission, likely secondary to hyperkalemia. 5.  Anemia of chronic kidney disease hemoglobin 9.8. 6.  Respiratory failure, requiring HFNC  Plan: Patient continues to have oliguria with urine output of only 120 cc over the preceding 24 hours.  Therefore at this time we will maintain the patient on CRRT.  Transition to hemodialysis could be considered tomorrow if the patient is completely off of pressors.  Patient remains on high flow nasal cannula at this point in time.  Overall prognosis appears to be quite guarded as patient is quite debilitated and has a number of comorbidities.   LOS: 5 Layloni Fahrner 5/12/20191:55 PM

## 2017-06-05 NOTE — Progress Notes (Signed)
CRRT Medication Adjustments:  Pharmacy consulted for CRRT medication adjustments for 84 yo male re-initiated on CRRT on 5/10.  No adjustments warranted at this time.   Pharmacy will continue to monitor and adjust per consult.   Paulina Fusi, PharmD, BCPS 06/05/2017 1:40 PM

## 2017-06-05 NOTE — Progress Notes (Addendum)
Pharmacy Electrolyte Monitoring Consult:  Pharmacy consulted to assist in monitoring and replacing electrolytes in this 84 y.o. male admitted on 05/30/2017 with Weakness   Labs:  Sodium (mmol/L)  Date Value  06/05/2017 136  06/15/2011 141   Potassium (mmol/L)  Date Value  06/05/2017 4.3  06/15/2011 3.7   Magnesium (mg/dL)  Date Value  06/05/2017 1.7  06/14/2011 1.7 (L)   Phosphorus (mg/dL)  Date Value  06/05/2017 2.2 (L)   Calcium (mg/dL)  Date Value  06/05/2017 7.8 (L)   Calcium, Total (mg/dL)  Date Value  06/15/2011 8.3 (L)   Albumin (g/dL)  Date Value  06/05/2017 2.1 (L)  06/11/2011 4.1    Assessment/Plan: Phosphate 2.2, patient w/ pulmonary edema was hypotension off pressors. Patient NPO, no feeding tube will replace phosphate w/ 20 mmol of sodium glycophosphate IV x 1 over 8 hours. Will recheck phosphate w/ am labs.  05/13 @ 0600 Mg 1.6, will replace w/ 2g Mag IV and follow-up w/ next mag level.  Tobie Lords, PharmD, BCPS Clinical Pharmacist 06/05/2017

## 2017-06-05 NOTE — Progress Notes (Signed)
Follow up - Critical Care Medicine Note  Patient Details:    [name] is an 84 y.o. male.with acute renal failure and hyperkalemia requiring emergent hemodialysis  Lines, Airways, Drains: Urostomy (Active)  Ostomy Pouch 2 piece 06/01/2017 12:01 AM  Stoma Assessment Pink 06/01/2017 12:01 AM  Output (mL) 35 mL 05/31/2017  5:41 PM    Anti-infectives:  Anti-infectives (From admission, onward)   Start     Dose/Rate Route Frequency Ordered Stop   06/04/17 1500  amoxicillin-clavulanate (AUGMENTIN) 400-57 MG/5ML suspension 872 mg     875 mg of amoxicillin Oral Every 12 hours 06/04/17 1447     06/03/17 1500  Ampicillin-Sulbactam (UNASYN) 3 g in sodium chloride 0.9 % 100 mL IVPB  Status:  Discontinued     3 g 200 mL/hr over 30 Minutes Intravenous Every 6 hours 06/03/17 0944 06/04/17 1447   06/03/17 0945  vancomycin (VANCOCIN) 1,250 mg in sodium chloride 0.9 % 250 mL IVPB     1,250 mg 166.7 mL/hr over 90 Minutes Intravenous Daily 06/03/17 0944     06/03/17 0915  vancomycin (VANCOCIN) IVPB 1000 mg/200 mL premix  Status:  Discontinued     1,000 mg 200 mL/hr over 60 Minutes Intravenous  Once 06/03/17 0904 06/03/17 0944   06/03/17 0915  Ampicillin-Sulbactam (UNASYN) 3 g in sodium chloride 0.9 % 100 mL IVPB  Status:  Discontinued     3 g 200 mL/hr over 30 Minutes Intravenous Every 12 hours 06/03/17 0904 06/03/17 0944   06/02/17 0515  cefTRIAXone (ROCEPHIN) 1 g in sodium chloride 0.9 % 100 mL IVPB  Status:  Discontinued     1 g 200 mL/hr over 30 Minutes Intravenous Every 24 hours 06/02/17 0509 06/02/17 0509   06/02/17 0515  cefTRIAXone (ROCEPHIN) 1 g in sodium chloride 0.9 % 100 mL IVPB  Status:  Discontinued     1 g 200 mL/hr over 30 Minutes Intravenous Daily 06/02/17 0510 06/03/17 0830   05/31/17 1200  cefTRIAXone (ROCEPHIN) 1 g in sodium chloride 0.9 % 100 mL IVPB  Status:  Discontinued     1 g 200 mL/hr over 30 Minutes Intravenous Daily 05/31/17 1156 06/02/17 0510       Microbiology: Results for orders placed or performed during the hospital encounter of 05/30/17  Culture, blood (routine x 2)     Status: None   Collection Time: 05/31/17 12:05 AM  Result Value Ref Range Status   Specimen Description BLOOD RIGHT HAND  Final   Special Requests   Final    BOTTLES DRAWN AEROBIC AND ANAEROBIC Blood Culture adequate volume   Culture   Final    NO GROWTH 5 DAYS Performed at [facility], [address], [place], [place] [address]    Report Status 06/05/2017 FINAL  Final  Urine culture     Status: Abnormal   Collection Time: 05/31/17 12:05 AM  Result Value Ref Range Status   Specimen Description   Final    URINE, RANDOM Performed at [facility], [address] [place], [place] [address]    Special Requests   Final    NONE Performed at [facility], [address] [place], [place] [address]    Culture MULTIPLE SPECIES PRESENT, SUGGEST RECOLLECTION (A)  Final   Report Status 06/01/2017 FINAL  Final  Culture, blood (routine x 2)     Status: None   Collection Time: 05/31/17 12:08 AM  Result Value Ref Range Status   Specimen Description BLOOD LEFT AC  Final   Special Requests   Final    BOTTLES DRAWN AEROBIC AND ANAEROBIC Blood Culture adequate volume   Culture   Final    NO GROWTH 5 DAYS Performed at [facility], [address] [place], [place] [address]    Report Status 06/05/2017 FINAL  Final  MRSA PCR Screening     Status: None   Collection Time: 05/31/17  9:47 AM  Result Value Ref Range Status   MRSA by PCR NEGATIVE NEGATIVE Final    Comment:        The GeneXpert MRSA Assay (FDA approved for NASAL specimens only), is one component of a comprehensive MRSA colonization surveillance program. It is not intended to diagnose MRSA infection nor to guide or monitor treatment for MRSA infections. Performed at [facility], [address], [place], [place] [address]   Urine Culture      Status: Abnormal   Collection Time: 06/02/17  5:27 PM  Result Value Ref Range Status   Specimen Description   Final    URINE, RANDOM Performed at [facility], [address] [place], [place] [address]    Special Requests   Final    NONE Performed at [facility], [address] [place], [place] [address]    Culture (A)  Final    <10,000 COLONIES/mL INSIGNIFICANT GROWTH Performed at [facility] [address] [place],  [address]    Report Status 06/03/2017 FINAL  Final  Events:   Studies: Ct Head Wo Contrast  Result Date: 05/31/2017 CLINICAL DATA:  Altered level of consciousness, patient fell out of bed and is hypotensive with bradycardia. EXAM: CT HEAD WITHOUT CONTRAST TECHNIQUE: Contiguous axial images were obtained from the base of the skull through the vertex without intravenous contrast. COMPARISON:  None. FINDINGS: Brain: Remote appearing right thalamic lacunar infarct. Age related involutional changes of the brain with mild-to-moderate small vessel ischemic disease. No large vascular territory infarction, hemorrhage or midline shift. No intra-axial mass nor extra-axial fluid collections. Vascular: No hyperdense vessel sign. Skull: Intact Sinuses/Orbits: Complete opacification of the included maxillary sinuses with desiccated mucus seen within. Moderate ethmoid sinus mucosal thickening more so anteriorly. The sphenoid sinus is clear. There is right-sided frontal sinus mucosal opacification. Intact orbits and globes. Other: None IMPRESSION: Paranasal sinus mucosal thickening in opacification as above. Remote appearing small vessel ischemic disease and right thalamic lacunar infarct. No acute intracranial abnormality. Electronically Signed   By: Ashley Royalty M.D.   On: 05/31/2017 01:47   US Renal  Result Date: 05/31/2017 CLINICAL DATA:  84 year old male with renal failure. Bladder cancer post cystectomy. Initial encounter. EXAM: RENAL / URINARY TRACT  ULTRASOUND COMPLETE COMPARISON:  04/01/2016 MR. FINDINGS: Right Kidney: Length: 10.6 cm. Thin renal cortex. No hydronephrosis. Upper pole 4.7 x 4.8 x 5.5 cm cyst. Left Kidney: Length: 10.1 cm. Thin renal parenchyma. No hydronephrosis or mass. Bladder: Surgically removed. Ascites. Heterogeneous liver with lobular contour consistent with history of cirrhosis. Splenomegaly. IMPRESSION: Bilateral thin renal parenchyma. No hydronephrosis. 5.5 cm right renal cyst. Urinary bladder surgically removed. Ascites around the liver and spleen. Heterogeneous liver with lobular contour consistent with history of cirrhosis. Splenomegaly. Liver and spleen not completely assessed. Electronically Signed   By: Genia Del M.D.   On: 05/31/2017 12:31   Dg Chest Port 1 View  Result Date: 06/05/2017 CLINICAL DATA:  Acute respiratory failure, history CHF EXAM: PORTABLE CHEST 1 VIEW COMPARISON:  Portable exam 0501 hours compared to 06/02/2017 FINDINGS:  LEFT jugular line with tip projecting over SVC. Enlargement of cardiac silhouette with pulmonary vascular congestion. Atherosclerotic calcification aorta. Pericardial calcification again identified. Increased RIGHT pleural effusion and mild RIGHT basilar atelectasis. Diffuse interstitial infiltrates favor pulmonary edema. No pneumothorax. IMPRESSION: Persistent pulmonary edema. Increased RIGHT pleural effusion and mild RIGHT basilar atelectasis. Electronically Signed   By: Lavonia Dana M.D.   On: 06/05/2017 07:14   Dg Chest Port 1 View  Result Date: 06/02/2017 CLINICAL DATA:  Acute respiratory failure EXAM: PORTABLE CHEST 1 VIEW COMPARISON:  05/31/2017 FINDINGS: Cardiac shadow is within normal limits. Left jugular central line is again seen and stable. Some increasing atelectasis is noted in the right upper lobe along the minor fissure. Additionally slight increase in right-sided pleural effusion is noted as well. No other focal abnormality is noted. IMPRESSION: Increasing right upper  lobe atelectasis and right pleural effusion. Electronically Signed   By: Inez Catalina M.D.   On: 06/02/2017 10:21   Dg Chest Port 1 View  Result Date: 05/31/2017 CLINICAL DATA:  Status post central line placement EXAM: PORTABLE CHEST 1 VIEW COMPARISON:  05/30/2017 FINDINGS: Cardiac shadow is stable. New left jugular temporary dialysis catheter is seen in satisfactory position. No pneumothorax is noted. Patchy atelectatic changes are noted throughout both lungs. No focal confluent infiltrate is seen. IMPRESSION: No pneumothorax following central line placement. Electronically Signed   By: Inez Catalina M.D.   On: 05/31/2017 13:13   Dg Chest Port 1 View  Result Date: 05/31/2017 CLINICAL DATA:  Weakness and declining health. EXAM: PORTABLE CHEST 1 VIEW COMPARISON:  None. FINDINGS: Cardiomegaly with moderate aortic atherosclerosis. Pulmonary vascular redistribution with small right effusion compatible with CHF. No acute osseous abnormality. IMPRESSION: Cardiomegaly with mild CHF and small right effusion. Electronically Signed   By: Ashley Royalty M.D.   On: 05/31/2017 00:29    Consults: Treatment Team:  Anthonette Legato, MD   Subjective:    Overnight Issues:patient's pressors were changed to dopamine secondary to bradycardia. Presently on 3 mics of dopamine with stable hemodynamics. He still is somewhat groggy but arousable. Continues on CRRT  Objective:  Vital signs for last 24 hours: Temp:  [95.8 F (35.4 C)-98.2 F (36.8 C)] 97.4 F (36.3 C) (05/12 0800) Pulse Rate:  [59-86] 75 (05/12 0600) Resp:  [14-29] 17 (05/12 0600) BP: (107-144)/(36-88) 126/43 (05/12 0600) SpO2:  [92 %-96 %] 93 % (05/12 0600) FiO2 (%):  [45 %] 45 % (05/12 0400) Weight:  [253 lb 4.9 oz (114.9 kg)] 253 lb 4.9 oz (114.9 kg) (05/12 0500)  Hemodynamic parameters for last 24 hours:    Intake/Output from previous day: 05/11 0701 - 05/12 0700 In: 792 [P.O.:30; I.V.:512; IV Piggyback:250] Out: 120  [Urine:120]  Intake/Output this shift: No intake/output data recorded.  Vent settings for last 24 hours: FiO2 (%):  [45 %] 45 %  Physical Exam:  Patient is no oriented in no acute distress. Vital signs: Please see above list vital signs Cardiovascular: Regular rate and rhythm Pulmonary: Clear to auscultation Abdominal: Positive bowel sounds, soft exam Extremities: No clubbing or cyanosis Neurologic: No focal deficits appreciated  Assessment/Plan:   Hyperkalemia. Has been placed on CRRT secondary to hemodynamic compromise. Potassium is 4.4.   Shock. Patient was converted to dopamine secondary to bradycardia and hypotension. On broad-spectrum antibiotic coverage for right-sided pneumonia To include Unasyn and vancomycin  Anemia. On PPI will recheck CBC   Atrial Fibrillation. Bradycardic some improvement with pulse in the 60s  Discussion with daughter about goals of  care. She states that she has spoken to him numerous times and that he would wish short-term aggressive care to include intubation and CPR. If this appeared to be going long-term then that is not something he would want.   Nicola Heinemann 06/05/2017  *Care during the described time interval was provided by me and/or other providers on the critical care team.  I have reviewed this patient's available data, including medical history, events of note, physical examination and test results as part of my evaluation. Patient ID: TAREK CRAVENS, male   DOB: November 18, 1933, 84 y.o.   MRN: [id number] Patient ID: MARRELL DICAPRIO, male   DOB: 04-Jan-1934, 84 y.o.   MRN: [id number] Patient ID: MAVIN DYKE, male   DOB: 07/08/1933, 84 y.o.   MRN: [id number] Patient ID: BALEN WOOLUM, male   DOB: [birth year], 84 y.o.   MRN: [id number]

## 2017-06-06 DIAGNOSIS — R188 Other ascites: Secondary | ICD-10-CM

## 2017-06-06 DIAGNOSIS — N179 Acute kidney failure, unspecified: Secondary | ICD-10-CM

## 2017-06-06 DIAGNOSIS — Z515 Encounter for palliative care: Secondary | ICD-10-CM

## 2017-06-06 DIAGNOSIS — J189 Pneumonia, unspecified organism: Secondary | ICD-10-CM

## 2017-06-06 DIAGNOSIS — N189 Chronic kidney disease, unspecified: Secondary | ICD-10-CM

## 2017-06-06 DIAGNOSIS — A419 Sepsis, unspecified organism: Secondary | ICD-10-CM

## 2017-06-06 DIAGNOSIS — K7460 Unspecified cirrhosis of liver: Secondary | ICD-10-CM

## 2017-06-06 DIAGNOSIS — Z7189 Other specified counseling: Secondary | ICD-10-CM

## 2017-06-06 LAB — MAGNESIUM: Magnesium: 1.6 mg/dL — ABNORMAL LOW (ref 1.7–2.4)

## 2017-06-06 LAB — GLUCOSE, CAPILLARY
Glucose-Capillary: 115 mg/dL — ABNORMAL HIGH (ref 65–99)
Glucose-Capillary: 68 mg/dL (ref 65–99)
Glucose-Capillary: 71 mg/dL (ref 65–99)
Glucose-Capillary: 74 mg/dL (ref 65–99)
Glucose-Capillary: 80 mg/dL (ref 65–99)
Glucose-Capillary: 81 mg/dL (ref 65–99)
Glucose-Capillary: 85 mg/dL (ref 65–99)
Glucose-Capillary: 86 mg/dL (ref 65–99)

## 2017-06-06 LAB — RENAL FUNCTION PANEL
ALBUMIN: 2.1 g/dL — AB (ref 3.5–5.0)
ANION GAP: 4 — AB (ref 5–15)
BUN: 9 mg/dL (ref 6–20)
CALCIUM: 7.9 mg/dL — AB (ref 8.9–10.3)
CO2: 28 mmol/L (ref 22–32)
CREATININE: 1.09 mg/dL (ref 0.61–1.24)
Chloride: 105 mmol/L (ref 101–111)
GFR calc non Af Amer: >60 mL/min (ref >60)
GLUCOSE: 74 mg/dL (ref 65–99)
PHOSPHORUS: 2.3 mg/dL — AB (ref 2.5–4.6)
Potassium: 4.2 mmol/L (ref 3.5–5.1)
SODIUM: 137 mmol/L (ref 135–145)

## 2017-06-06 LAB — PROCALCITONIN: Procalcitonin: 0.13 ng/mL

## 2017-06-06 LAB — PHOSPHORUS: PHOSPHORUS: 2.1 mg/dL — AB (ref 2.5–4.6)

## 2017-06-06 MED ORDER — DEXTROSE 50 % IV SOLN
1.0000 | Freq: Once | INTRAVENOUS | Status: AC
  Administered 2017-06-06: 50 mL via INTRAVENOUS
  Filled 2017-06-06: qty 50

## 2017-06-06 MED ORDER — POTASSIUM & SODIUM PHOSPHATES 280-160-250 MG PO PACK
2.0000 | PACK | ORAL | Status: AC
  Administered 2017-06-06 – 2017-06-07 (×4): 2 via ORAL
  Filled 2017-06-06 (×4): qty 2

## 2017-06-06 MED ORDER — MAGNESIUM SULFATE 2 GM/50ML IV SOLN
2.0000 g | Freq: Once | INTRAVENOUS | Status: AC
  Administered 2017-06-06: 2 g via INTRAVENOUS
  Filled 2017-06-06: qty 50

## 2017-06-06 MED ORDER — SODIUM GLYCEROPHOSPHATE 1 MMOLE/ML IV SOLN
20.0000 mmol | Freq: Once | INTRAVENOUS | Status: AC
  Administered 2017-06-06: 20 mmol via INTRAVENOUS
  Filled 2017-06-06: qty 20

## 2017-06-06 MED ORDER — STERILE WATER FOR INJECTION IJ SOLN
INTRAMUSCULAR | Status: AC
  Filled 2017-06-06: qty 10

## 2017-06-06 NOTE — Progress Notes (Signed)
[facility]  ROUNDING NOTE   Subjective:   Daughter at bedside.   UOP 160  Off dopamine  HFNC   Objective:  Vital signs in last 24 hours:  Temp:  [96.9 F (36.1 C)-97.5 F (36.4 C)] 97.4 F (36.3 C) (05/13 0730) Pulse Rate:  [60-79] 67 (05/13 0800) Resp:  [14-27] 16 (05/13 0800) BP: (92-143)/(41-83) 124/46 (05/13 0800) SpO2:  [95 %-98 %] 97 % (05/13 0800) FiO2 (%):  [45 %] 45 % (05/13 0400)  Weight change:  Filed Weights   06/03/17 0500 06/04/17 0500 06/05/17 0500  Weight: 111.6 kg (246 lb 0.5 oz) 114 kg (251 lb 5.2 oz) 114.9 kg (253 lb 4.9 oz)    Intake/Output: I/O last 3 completed shifts: In: 168.5 [I.V.:84.5; IV Piggyback:84] Out: 160 [Urine:160]   Intake/Output this shift:  No intake/output data recorded.  Physical Exam: General: Critically Ill  Head: Normocephalic, atraumatic.   Eyes: Anicteric  Neck: Supple, trachea midline  Lungs:  Scattered rhonchi, HFNC  Heart: regular  Abdomen:  Soft, nontender, bowel sounds present, urostomy in place  Extremities: 2+ peripheral edema in all four extremities  Neurologic: Alert and oriented  Skin: No lesions  Access: Left IJ temporary dialysis catheter    Basic Metabolic Panel: Recent Labs  Lab 06/05/17 0413 06/05/17 1451 06/05/17 1749 06/05/17 2218 06/06/17 0406  NA 135 135 136 136 137  K 4.4 4.3 4.4 4.3 4.2  CL 106 106 105 106 105  CO2 27 25 27 27 28   GLUCOSE 95 87 85 78 74  BUN 13 11 11 11 9   CREATININE 1.21 1.05 1.06 0.96 1.09  CALCIUM 7.8* 7.7* 8.0* 7.8* 7.9*  MG 1.8 1.7 1.8 1.7 1.6*  PHOS 1.8* 2.6 2.3* 2.2* 2.3*    Liver Function Tests: Recent Labs  Lab 05/31/17 0008  06/04/17 2140  06/05/17 0413 06/05/17 1451 06/05/17 1749 06/05/17 2218 06/06/17 0406  AST 45*  --  46*  --   --   --   --   --   --   ALT 16*  --  14*  --   --   --   --   --   --   ALKPHOS 110  --  113  --   --   --   --   --   --   BILITOT 1.0  --  1.6*  --   --   --   --   --   --   PROT 5.8*  --   5.1*  --   --   --   --   --   --   ALBUMIN 2.5*   < > 2.1*  2.2*   < > 2.2* 2.1* 2.1* 2.1* 2.1*   < > = values in this interval not displayed.   No results for input(s): LIPASE, AMYLASE in the last 168 hours. Recent Labs  Lab 06/04/17 2140  AMMONIA 50*    CBC: Recent Labs  Lab 05/31/17 0008 05/31/17 2145 06/02/17 0323 06/03/17 0433 06/05/17 1451  WBC 4.4 4.8 6.9 5.8 3.7*  NEUTROABS 3.4  --  4.9 3.6 2.5  HGB 8.1* 8.9* 9.0* 9.8* 8.7*  HCT 25.1* 27.1* 27.2* 28.8* 26.2*  MCV 97.9 96.1 96.3 95.7 93.7  PLT 121* 125* 149* 160 73*    Cardiac Enzymes: Recent Labs  Lab 05/31/17 0008 05/31/17 1026 06/04/17 2140  CKTOTAL  --  99  --   TROPONINI <0.03  --  0.03*  BNP: Invalid input(s): POCBNP  CBG: Recent Labs  Lab 06/05/17 2004 06/06/17 0010 06/06/17 0409 06/06/17 0736 06/06/17 0813  GLUCAP 78 74 71 68 115*    Microbiology: Results for orders placed or performed during the hospital encounter of 05/30/17  Culture, blood (routine x 2)     Status: None   Collection Time: 05/31/17 12:05 AM  Result Value Ref Range Status   Specimen Description BLOOD RIGHT HAND  Final   Special Requests   Final    BOTTLES DRAWN AEROBIC AND ANAEROBIC Blood Culture adequate volume   Culture   Final    NO GROWTH 5 DAYS Performed at [facility], [address] [place], [place] [address]    Report Status 06/05/2017 FINAL  Final  Urine culture     Status: Abnormal   Collection Time: 05/31/17 12:05 AM  Result Value Ref Range Status   Specimen Description   Final    URINE, RANDOM Performed at [facility], [address] [place], [place] [address]    Special Requests   Final    NONE Performed at [facility], [address] [place], [place] [address]    Culture MULTIPLE SPECIES PRESENT, SUGGEST RECOLLECTION (A)  Final   Report Status 06/01/2017 FINAL  Final  Culture, blood (routine x 2)     Status: None   Collection Time: 05/31/17 12:08 AM   Result Value Ref Range Status   Specimen Description BLOOD LEFT AC  Final   Special Requests   Final    BOTTLES DRAWN AEROBIC AND ANAEROBIC Blood Culture adequate volume   Culture   Final    NO GROWTH 5 DAYS Performed at [facility], [address] [place], [place] [address]    Report Status 06/05/2017 FINAL  Final  MRSA PCR Screening     Status: None   Collection Time: 05/31/17  9:47 AM  Result Value Ref Range Status   MRSA by PCR NEGATIVE NEGATIVE Final    Comment:        The GeneXpert MRSA Assay (FDA approved for NASAL specimens only), is one component of a comprehensive MRSA colonization surveillance program. It is not intended to diagnose MRSA infection nor to guide or monitor treatment for MRSA infections. Performed at [facility], [address], [place], [place] [address]   Urine Culture     Status: Abnormal   Collection Time: 06/02/17  5:27 PM  Result Value Ref Range Status   Specimen Description   Final    URINE, RANDOM Performed at [facility], [address], [place], [place] [address]    Special Requests   Final    NONE Performed at [facility], [address] [place], [place] [address]    Culture (A)  Final    <10,000 COLONIES/mL INSIGNIFICANT GROWTH Performed at [facility] [address] [place], [place] [address]    Report Status 06/03/2017 FINAL  Final    Coagulation Studies: No results for input(s): LABPROT, INR in the last 72 hours.  Urinalysis: No results for input(s): COLORURINE, LABSPEC, PHURINE, GLUCOSEU, HGBUR, BILIRUBINUR, KETONESUR, PROTEINUR, UROBILINOGEN, NITRITE, LEUKOCYTESUR in the last 72 hours.  Invalid input(s): APPERANCEUR    Imaging: US Venous Img Upper Uni Right  Result Date: 06/05/2017 CLINICAL DATA:  84 year old male with a history of right upper arm swelling EXAM: RIGHT UPPER EXTREMITY VENOUS DOPPLER ULTRASOUND TECHNIQUE: Gray-scale sonography with graded  compression, as well as color Doppler and duplex ultrasound were  performed to evaluate the upper extremity deep venous system from the level of the subclavian vein and including the jugular, axillary, basilic, radial, ulnar and upper cephalic vein. Spectral Doppler was utilized to evaluate flow at rest and with distal augmentation maneuvers. COMPARISON:  None. FINDINGS: Contralateral Subclavian Vein: Respiratory phasicity is normal and symmetric with the symptomatic side. No evidence of thrombus. Normal compressibility. Internal Jugular Vein: No evidence of thrombus. Normal compressibility, respiratory phasicity and response to augmentation. Subclavian Vein: No evidence of thrombus. Normal compressibility, respiratory phasicity and response to augmentation. Axillary Vein: No evidence of thrombus. Normal compressibility, respiratory phasicity and response to augmentation. Cephalic Vein: No evidence of thrombus. Normal compressibility, respiratory phasicity and response to augmentation. Basilic Vein: No evidence of thrombus. Normal compressibility, respiratory phasicity and response to augmentation. Brachial Veins: No evidence of thrombus. Normal compressibility, respiratory phasicity and response to augmentation. Radial Veins: No evidence of thrombus. Normal compressibility, respiratory phasicity and response to augmentation. Ulnar Veins: No evidence of thrombus. Normal compressibility, respiratory phasicity and response to augmentation. Other Findings:  None visualized. IMPRESSION: Sonographic survey of the right upper extremity negative for DVT Electronically Signed   By: Corrie Mckusick D.O.   On: 06/05/2017 11:22   Dg Chest Port 1 View  Result Date: 06/05/2017 CLINICAL DATA:  Acute respiratory failure, history CHF EXAM: PORTABLE CHEST 1 VIEW COMPARISON:  Portable exam 0501 hours compared to 06/02/2017 FINDINGS: LEFT jugular line with tip projecting over SVC. Enlargement of cardiac silhouette with pulmonary  vascular congestion. Atherosclerotic calcification aorta. Pericardial calcification again identified. Increased RIGHT pleural effusion and mild RIGHT basilar atelectasis. Diffuse interstitial infiltrates favor pulmonary edema. No pneumothorax. IMPRESSION: Persistent pulmonary edema. Increased RIGHT pleural effusion and mild RIGHT basilar atelectasis. Electronically Signed   By: Lavonia Dana M.D.   On: 06/05/2017 07:14     Medications:   . sodium chloride    . ampicillin-sulbactam (UNASYN) IV Stopped (06/06/17 0445)  . DOPamine Stopped (06/05/17 0900)  . pureflow 3 each (06/06/17 0445)  . vancomycin Stopped (06/05/17 1243)   . docusate sodium  100 mg Oral BID  . ferrous sulfate  325 mg Oral Q breakfast  . insulin aspart  0-5 Units Subcutaneous QHS  . insulin aspart  0-9 Units Subcutaneous TID WC  . magnesium oxide  400 mg Oral Daily  . nystatin   Topical TID  . pantoprazole (PROTONIX) IV  40 mg Intravenous Q12H  . simvastatin  20 mg Oral QHS  . sodium chloride flush  10-40 mL Intracatheter Q12H  . umeclidinium-vilanterol  1 puff Inhalation Daily  . vitamin B-12  250 mcg Oral Daily   acetaminophen **OR** acetaminophen, albuterol, heparin, morphine injection, ondansetron **OR** ondansetron (ZOFRAN) IV, sodium chloride flush  Assessment/ Plan:  Mr. [name] is a 84 y.o. white male with atrial fibrillation, congestive heart failure (no recent echo), COPD, hepatic cirrhosis, diabetes mellitus type II, gastric varices, history of bladder cancer status post ileal conduit, hypertension, hyperlipidemia who was admitted to [facility] on 05/30/2017 for GI bleed and acute renal failure. Placed on CRRT from 5/8 to 5/9, then on 5/9 had intermitted hemodialysis treatment, restarted on CRRT  5/10 to present  1.  Acute renal failure with hyperkalemia n chronic kidney disease stage III baseline creatinine of 1.8 GFR of 38 on 04/19/17.  Acute renal failure secondary to ATN Chronic kidney disease secondary to  obstructive uropathy, hypertension, and diabetes.  - Requiring renal replacement therapy since admission.  - Transition from CRRT to intermittent hemodialysis.  Intermitted hemodialysis treatment for later today.  - Monitor daily for dialysis needs.   2.  Anemia of chronic kidney disease with thrombocytopenia: hemoglobin 8.7. GI bleed on amdisssion. Endoscopy on 5/8 by Dr. Vicente Males finding gastric varices  - Iron supplements - pantoprazole  3. Hypertension with atrial fibrillation: heart rate and blood pressure at goal.   4. Acute respiratory failure: requiring HFNC. With pneumonia, right pleural effusion and pulmonary edema.  - Unasyn and vancomycin   LOS: 6 [name] 5/13/20199:24 AM

## 2017-06-06 NOTE — Progress Notes (Signed)
   06/06/17 1445  Clinical Encounter Type  Visited With Patient and family together  Visit Type Follow-up (order for advanced directive)  Referral From Other (Comment) (NP)  Consult/Referral To Chaplain   Chaplain responded to order regarding advanced directive, met with patient and daughter.  Reviewed conversations that patient and family have had and assessed patient's desire to complete document and what patient's wishes for document were.  Chaplain located witnesses and notary.  Remained present for documentation completion, made a copy for the chart, and gave daughter the original.  Chaplain engaged in conversation with patient and daughter regarding feelings and thoughts around document completion.  Patient spoke of hopes for family gathering when he returned home.  Chaplain reviewed ongoing chaplain availability and encouraged them to page as needed.

## 2017-06-06 NOTE — Progress Notes (Signed)
Post HD Tx    06/06/17 1945  Hand-Off documentation  Report given to (Full Name) Dorian Furnace, RN   Report received from (Full Name) Beatris Ship, RN   Vital Signs  Temp 98.1 F (36.7 C)  Pulse Rate 85  Resp (!) 28  BP (!) 121/43  BP Location Right Arm  BP Method Automatic  Patient Position (if appropriate) Lying  Oxygen Therapy  SpO2 93 %  O2 Device HFNC  Pain Assessment  Pain Scale 0-10  Pain Score 0  Dialysis Weight  Weight 122 kg (268 lb 15.4 oz)  Type of Weight Post-Dialysis  Post-Hemodialysis Assessment  Rinseback Volume (mL) 250 mL  Dialyzer Clearance Clear  Duration of HD Treatment -hour(s) 3 hour(s)  Hemodialysis Intake (mL) 500 mL  UF Total -Machine (mL) 1001 mL  Net UF (mL) 501 mL  Tolerated HD Treatment Yes  Post-Hemodialysis Comments Pt stable, no complications   Hemodialysis Catheter Left Internal jugular Triple-lumen  Placement Date/Time: 05/31/17 1200   Placed prior to admission: No  Time Out: Correct patient;Correct site;Correct procedure  Maximum sterile barrier precautions: Hand hygiene;Sterile gloves;Cap;Large sterile sheet;Mask;Sterile gown  Site Prep: Chlorh...  Site Condition No complications  Blue Lumen Status Heparin locked  Red Lumen Status Heparin locked  Post treatment catheter status Capped and Clamped

## 2017-06-06 NOTE — Consult Note (Signed)
Consultation Note Date: 06/06/2017   Patient Name: [name]  DOB: 17-Jun-1933  MRN: [id number]  Age / Sex: 84 y.o., male  PCP: Michael Bush, MD Referring Physician: Gorden Harms, MD  Reason for Consultation: Establishing goals of care  HPI/Patient Profile: 84 y.o. male  with past medical history of cryptongenic cirrhosis (w/ history of hypoalbuminemia), DM2, iron deficiency anemia, pancytopenia (presumed d/t cirrhosis), bladder cancer s/p cystectomy (very remote), HTN, severe COPD, Gastric and duodenal hyperdysplasia, a. Fib, CHF, CKD III, admitted on 05/30/2017 weakness, bradycardia, hypotension. Workup found him to be in acute on chronic renal failure- ?from dehydration r/t hemorrhagic gastritis discovered on endoscopy as well as septic shock d/t pneumonia. Renal u/s showed ascites. He has required CRRT- is off now and attempts are being made at intermittent dialysis. He is currently on HF nasal cannula with FIO2 at 35%. Chest xray shows ongoing pulmonary edema. Palliative medicine consulted for "GOC- Progressive decline despite treatment".     Clinical Assessment and Goals of Care:  I have reviewed medical records including EPIC notes, labs and imaging, received report from patient's nurse [name], assessed the patient and then met at the bedside along with the patient and his daugtherAnderson [name]  to discuss diagnosis prognosis, GOC, EOL wishes, disposition and options.  I introduced Palliative Medicine as specialized medical care for people living with serious illness. It focuses on providing relief from the symptoms and stress of a serious illness. The goal is to improve quality of life for both the patient and the family.  We discussed a brief life review of the patient. He is retired for working as a Administrator and working in Teacher, adult education. He used to enjoy playing pool. He lived at home with his  spouse who has severe osteoporosis and their daughter [name]. He has several daughters and one son and many grandchildren.   As far as functional and nutritional status- he has had great decline over the last year. [name] has been a primary caretaker. [name] has noticed changes in his ability to walk in the last several months. He has been living a bed to chair existence and has complained of his "bottom" hurting. He hasn't noticed a change in his appetite- but [name] states he has always eaten very little. They would like his diet to be advanced now if possible.    We discussed their current illness and what it means in the larger context of their on-going co-morbidities.  Natural disease trajectory and expectations at EOL were discussed. The patient is asking about his illness while he is here in the hospital. I reviewed his stay- answered his questions and updated him on his situation. We discussed his kidney failure. He is unsure if he would want long term hemodialysis if this were required. We discussed his tenuous respiratory status and the need to wean from HF oxygen. His daughter [name] feels he has improved a great deal from his first day here. [name] says he is tired. He makes comments that indicate he  understands the severity of his situation. [name] is hopeful to improve and be able to return home. When asked if he would be willing to spend time in a rehab facility if that were necessary for him to be able to return home- he says this is a difficult question, that he would have to consider.   I attempted to elicit values and goals of care important to the patient. Being with his family is very important to him.     The difference between aggressive medical intervention and comfort care was explained.   Advanced directives, HCPOA concepts specific to code status, and dialysis were explained. [name] has a copy of HCPOA and advanced directives at the bedside. We reviewed them and  the patient notes he would not want his life prolonged with life support in the event he had a condition that physician's deemed incurable, however, he would want initial attempts to be made at resuscitation for now. He would want his daughterAnderson [name] to be his speaker of his wishes if he was unable to speak for himself.   We discussed a patient's right to choose to continue or to decline aggressive medical care at any point.   Questions and concerns were addressed.  The family was encouraged to call with questions or concerns.    Primary Decision Maker PATIENT    SUMMARY OF RECOMMENDATIONS -Continue current care -PMT will continue to follow and discuss GOC -Pt is unsure if he would want long term dialysis if this were required -Chaplain consult for notarizing HCPOA paperwork -?Can diet be advanced?    Code Status/Advance Care Planning:  Full code  Palliative Prophylaxis:   Frequent Pain Assessment  Additional Recommendations (Limitations, Scope, Preferences):  Full Scope Treatment   Prognosis:    Unable to determine  Discharge Planning: To Be Determined  Primary Diagnoses: Present on Admission: . Acute kidney injury superimposed on chronic kidney disease ([place])   I have reviewed the medical record, interviewed the patient and family, and examined the patient. The following aspects are pertinent.  Past Medical History:  Diagnosis Date  . Arthritis   . Atrial fibrillation ([place]) 06/23/2010  . B12 deficiency   . C. difficile colitis 07/06/2011  . CHF (congestive heart failure) (H[facility])    diastolic. Echo 5/10 with mild LVH, EF 32%, grade 1 diastolic dysfunction, severe left atrial enlargement, aortic sclerosis  . Chronic a-fib ([facility])    on pradaxa, declined cardioversion in past  . COPD (chronic obstructive pulmonary disease) ([place])    (Dr. Raul [name])  . Cryptogenic cirrhosis ([place])    Followed by Dr [name], never biopsied, has been stable.  History of gastric varices.  .  Diabetes mellitus, type 2 ([place])   . Gastric and duodenal angiodysplasia 06/28/2011  . Gastric varices   . GI bleed   . Hemorrhoids   . History of bladder cancer 1984   s/p ileal conduit  . History of ileostomy   . History of pneumonia 2012  . Hyperlipidemia   . Hypertension   . Inguinal hernia    left  . Iron deficiency anemia   . Kidney atrophy 02/2016  . Pinched nerve    back  . Pyloric stenosis    mild EGD 10/26/06  . RLL pneumonia ([place]) 01/28/2014  . Shortness of breath    Social History   Socioeconomic History  . Marital status: Married    Spouse name: Not on file  . Number of children: 7  . Years of education:  Not on file  . Highest education level: Not on file  Occupational History  . Occupation: trucking, retired  Scientific laboratory technician  . Financial resource strain: Not on file  . Food insecurity:    Worry: Not on file    Inability: Not on file  . Transportation needs:    Medical: Not on file    Non-medical: Not on file  Tobacco Use  . Smoking status: Former Smoker    Packs/day: 1.00    Years: 45.00    Pack years: 45.00    Types: Cigarettes    Last attempt to quit: 06/22/1989    Years since quitting: 27.9  . Smokeless tobacco: Never Used  . Tobacco comment: Quit 1990  Substance and Sexual Activity  . Alcohol use: No    Alcohol/week: 0.6 oz    Types: 1 Standard drinks or equivalent per week  . Drug use: No  . Sexual activity: Not Currently  Lifestyle  . Physical activity:    Days per week: Not on file    Minutes per session: Not on file  . Stress: Not on file  Relationships  . Social connections:    Talks on phone: Not on file    Gets together: Not on file    Attends religious service: Not on file    Active member of club or organization: Not on file    Attends meetings of clubs or organizations: Not on file    Relationship status: Not on file  Other Topics Concern  . Not on file  Social History Narrative   Daily caffeine use - 1-2 cups/day coffee    Lives with wife.     Served 2 yrs in [employer]   7 children   Occ: retired, was Administrator.   Activity: no regular exercise   Diet: good water, fruits/vegetables daily   Family History  Problem Relation Age of Onset  . Hypertension Mother   . Osteoarthritis Mother   . Heart disease Father        MI  . Coronary artery disease Sister   . Diabetes Brother   . Heart disease Brother        MI  . Coronary artery disease Sister   . Stroke Other        GPs  . Colon cancer Neg Hx    Scheduled Meds: . docusate sodium  100 mg Oral BID  . ferrous sulfate  325 mg Oral Q breakfast  . insulin aspart  0-5 Units Subcutaneous QHS  . insulin aspart  0-9 Units Subcutaneous TID WC  . magnesium oxide  400 mg Oral Daily  . nystatin   Topical TID  . pantoprazole (PROTONIX) IV  40 mg Intravenous Q12H  . simvastatin  20 mg Oral QHS  . sodium chloride flush  10-40 mL Intracatheter Q12H  . umeclidinium-vilanterol  1 puff Inhalation Daily  . vitamin B-12  250 mcg Oral Daily   Continuous Infusions: . sodium chloride    . ampicillin-sulbactam (UNASYN) IV Stopped (06/06/17 1153)  . DOPamine Stopped (06/05/17 0900)   PRN Meds:.acetaminophen **OR** acetaminophen, albuterol, morphine injection, ondansetron **OR** ondansetron (ZOFRAN) IV, sodium chloride flush Medications Prior to Admission:  Prior to Admission medications   Medication Sig Start Date End Date Taking? Authorizing Provider  acetaminophen (TYLENOL) 325 MG tablet Take 325 mg by mouth every 6 (six) hours as needed for mild pain or fever.    Yes [provider]  albuterol (PROVENTIL HFA;VENTOLIN HFA) 108 (90 Base)  MCG/ACT inhaler Inhale 2 puffs into the lungs every 6 (six) hours as needed for wheezing.   Yes [provider]  cloNIDine (CATAPRES) 0.2 MG tablet TAKE 1 TABLET BY MOUTH TWICE A DAY 05/30/17  Yes Michael Bush, MD  doxazosin (CARDURA) 8 MG tablet TAKE 1/2 TABLET BY MOUTH TWICE A DAY 03/14/17  Yes Michael Bush, MD  ferrous sulfate (FERRO-BOB) 325 (65 FE) MG tablet Take 1 tablet (325 mg total) by mouth daily with breakfast. 07/06/11  Yes Debbe Odea, MD  glucose blood (ONE TOUCH ULTRA TEST) test strip Use to test sugar once daily and as needed Dx: E11.8 03/15/17  Yes Michael Bush, MD  losartan (COZAAR) 100 MG tablet TAKE 1 TABLET BY MOUTH ONCE DAILY 05/19/17  Yes Michael Bush, MD  magnesium oxide (MAG-OX) 400 MG tablet Take 400 mg by mouth daily.   Yes [provider]  nadolol (CORGARD) 40 MG tablet TAKE 1 TABLET BY MOUTH ONCE DAILY 03/14/17  Yes Michael Bush, MD  ONE TOUCH LANCETS MISC One daily and as needed/ ICD code 250.00    Yes [provider]  pantoprazole (PROTONIX) 40 MG tablet TAKE 1 TABLET BY MOUTH ONCE DAILY 05/09/17  Yes Michael Bush, MD  simvastatin (ZOCOR) 20 MG tablet TAKE 1 TABLET BY MOUTH EVERY DAY Patient taking differently: TAKE 1 TABLET BY MOUTH EVERY NIGHT 03/25/17  Yes Michael Bush, MD  tiotropium (SPIRIVA) 18 MCG inhalation capsule Place 18 mcg into inhaler and inhale daily.   Yes [provider]  vitamin B-12 (CYANOCOBALAMIN) 250 MCG tablet Take 250 mcg by mouth daily.   Yes [provider]  ALPRAZolam Duanne Moron) 0.5 MG tablet Take 0.5 mg by mouth at bedtime as needed for anxiety (For Laser procedure).    [provider]  Celedonio Miyamoto 62.5-25 MCG/INH AEPB  02/25/16   [provider]  furosemide (LASIX) 20 MG tablet TAKE 2 TABLETS BY MOUTH EVERY MORNING AND 1 TABLET IN THE AFTERNOON AS DIRECTED Patient not taking: Reported on 05/31/2017 03/29/17   Michael Bush, MD  triamcinolone cream (KENALOG) 0.1 % Apply 1 application topically 2 (two) times daily. Apply to AA. Patient not taking: Reported on 05/31/2017 09/09/14   Michael Bush, MD   No Known Allergies Review of Systems  Constitutional: Positive for activity change and fatigue.  Respiratory: Positive for shortness of breath.     Physical Exam    Constitutional: He is oriented to person, place, and time. He appears well-developed and well-nourished.  Cardiovascular: Normal rate and regular rhythm.  Pulmonary/Chest:  Wet cough, on heated hiflo  Musculoskeletal: He exhibits edema.  Neurological: He is alert and oriented to person, place, and time.  Nursing note and vitals reviewed.   Vital Signs: BP (!) 123/46   Pulse 70   Temp (!) 97.4 F (36.3 C) (Axillary)   Resp 18   Ht _0  (1.727 m)   Wt 114.9 kg (253 lb 4.9 oz)   SpO2 94%   BMI 38.52 kg/m  Pain Scale: 0-10 POSS *See Group Information*: 2-Acceptable,Slightly drowsy, easily aroused Pain Score: 0-No pain   SpO2: SpO2: 94 % O2 Device:SpO2: 94 % O2 Flow Rate: .O2 Flow Rate (L/min): 35 L/min  IO: Intake/output summary:   Intake/Output Summary (Last 24 hours) at 06/06/2017 1304 Last data filed at 06/06/2017 1051 Gross per 24 hour  Intake 160 ml  Output 30 ml  Net 130 ml    LBM: Last BM Date: 05/30/17 Baseline Weight: Weight: 97.5 kg (215 lb)  Most recent weight: Weight: 114.9 kg (253 lb 4.9 oz)     Palliative Assessment/Data: PPS: 20%     Thank you for this consult. Palliative medicine will continue to follow and assist as needed.   Time In: 1200 Time Out:1400  Time Total: 120 mins Prolong time billed: Yes Greater than 50%  of this time was spent counseling and coordinating care related to the above assessment and [name].  Signed by: Mariana Kaufman, AGNP-C Palliative Medicine    Please contact Palliative Medicine Team phone at [phone] for questions and concerns.  For individual provider: See Shea Evans

## 2017-06-06 NOTE — Progress Notes (Signed)
Pre HD assessment   06/06/17 1615  Vital Signs  Temp 98.4 F (36.9 C)  Temp Source Oral  Pulse Rate 70  Pulse Rate Source Monitor  Resp (!) 25  BP (!) 118/45  BP Location Right Arm  BP Method Automatic  Patient Position (if appropriate) Lying  Oxygen Therapy  SpO2 94 %  O2 Device HFNC  Pain Assessment  Pain Scale 0-10  Pain Score 0  Dialysis Weight  Weight 122.5 kg (270 lb 1 oz)  Type of Weight Pre-Dialysis  Time-Out for Hemodialysis  What Procedure? HD  Pt Identifiers(min of two) First/Last Name;MRN/Account#  Correct Site? Yes  Correct Side? Yes  Correct Procedure? Yes  Consents Verified? Yes  Rad Studies Available? N/A  Safety Precautions Reviewed? Yes  Engineer, civil (consulting) Number  (5A)  Station Number  (bedside, ICU 06)  UF/Alarm Test Passed  Conductivity: Meter 14.2  Conductivity: Machine  14.3  pH 7.4  Reverse Osmosis  (SN [id number])  Normal Saline Lot Number [id number]  Dialyzer Lot Number [id number]  Disposable Set Lot Number [id number]  Machine Temperature 98.6 F (37 C)  Musician and Audible Yes  Blood Lines Intact and Secured Yes  Pre Treatment Patient Checks  Vascular access used during treatment Catheter  Hepatitis B Surface Antigen Results Negative  Date Hepatitis B Surface Antigen Drawn 06/01/17  Hepatitis B Surface Antibody  (<10)  Date Hepatitis B Surface Antibody Drawn 06/01/17  Hemodialysis Consent Verified Yes  Hemodialysis Standing Orders Initiated Yes  ECG (Telemetry) Monitor On Yes  Prime Ordered Normal Saline  Length of  DialysisTreatment -hour(s) 3 Hour(s)  Dialyzer Elisio 17H NR  Dialysate 3K, 2.5 Ca  Dialysis Anticoagulant None  Dialysate Flow Ordered 600  Blood Flow Rate Ordered 400 mL/min  Ultrafiltration Goal 0.5 Liters  Pre Treatment Labs Phosphorus;Hepatitis B Surface Antigen  Dialysis Blood Pressure Support Ordered Normal Saline  Education / Care Plan  Dialysis Education Provided Yes  Documented Education in  Care Plan Yes  Hemodialysis Catheter Left Internal jugular Triple-lumen  Placement Date/Time: 05/31/17 1200   Placed prior to admission: No  Time Out: Correct patient;Correct site;Correct procedure  Maximum sterile barrier precautions: Hand hygiene;Sterile gloves;Cap;Large sterile sheet;Mask;Sterile gown  Site Prep: Chlorh...  Site Condition No complications  Blue Lumen Status Heparin locked  Red Lumen Status Heparin locked  Purple Lumen Status N/A  Dressing Type Biopatch  Dressing Status Dry;Intact;Old drainage  Drainage Description None

## 2017-06-06 NOTE — Progress Notes (Signed)
Blood glucose 68 therefore dextrose given IV.  Will recheck CBG. Patient alert watching tv without symptoms.

## 2017-06-06 NOTE — Progress Notes (Signed)
HD tx start    06/06/17 1634  Vital Signs  Pulse Rate 74  Pulse Rate Source Monitor  Resp (!) 25  BP (!) 121/47  BP Location Right Arm  BP Method Automatic  Patient Position (if appropriate) Lying  Oxygen Therapy  SpO2 92 %  O2 Device HFNC  During Hemodialysis Assessment  Blood Flow Rate (mL/min) 400 mL/min  Arterial Pressure (mmHg) -120 mmHg  Venous Pressure (mmHg) 140 mmHg  Transmembrane Pressure (mmHg) 50 mmHg  Ultrafiltration Rate (mL/min) 330 mL/min  Dialysate Flow Rate (mL/min) 600 ml/min  Conductivity: Machine  14.4  HD Safety Checks Performed Yes  Dialysis Fluid Bolus Normal Saline  Bolus Amount (mL) 250 mL  Intra-Hemodialysis Comments Tx initiated  Hemodialysis Catheter Left Internal jugular Triple-lumen  Placement Date/Time: 05/31/17 1200   Placed prior to admission: No  Time Out: Correct patient;Correct site;Correct procedure  Maximum sterile barrier precautions: Hand hygiene;Sterile gloves;Cap;Large sterile sheet;Mask;Sterile gown  Site Prep: Chlorh...  Blue Lumen Status Infusing  Red Lumen Status Infusing

## 2017-06-06 NOTE — Progress Notes (Signed)
Pharmacy Electrolyte Monitoring Consult:  Pharmacy consulted to assist in monitoring and replacing electrolytes in this 84 y.o. male admitted on 05/30/2017 with Weakness   Labs:  Sodium (mmol/L)  Date Value  06/06/2017 137  06/15/2011 141   Potassium (mmol/L)  Date Value  06/06/2017 4.2  06/15/2011 3.7   Magnesium (mg/dL)  Date Value  06/06/2017 1.6 (L)  06/14/2011 1.7 (L)   Phosphorus (mg/dL)  Date Value  06/06/2017 2.1 (L)   Calcium (mg/dL)  Date Value  06/06/2017 7.9 (L)   Calcium, Total (mg/dL)  Date Value  06/15/2011 8.3 (L)   Albumin (g/dL)  Date Value  06/06/2017 2.1 (L)  06/11/2011 4.1    Assessment/Plan: Phosphate 2.2, patient w/ pulmonary edema was hypotension off pressors. Patient NPO, no feeding tube will replace phosphate w/ 20 mmol of sodium glycophosphate IV x 1 over 8 hours. Will recheck phosphate w/ am labs.  05/13 @ 0600 Mg 1.6, will replace w/ 2g Mag IV and follow-up w/ next mag level.  5/13 Phos= 2.1. Hemodialysis patient.  patient on MagOx tablet 400mg  daily.  Will order Phos-NaK PO 2 packets q4h x 4.  REcheck phos with am labs.  Chinita Greenland PharmD Clinical Pharmacist 06/06/2017

## 2017-06-06 NOTE — Progress Notes (Signed)
Speech Language Pathology Treatment: Dysphagia  Patient Details Name: [name] MRN: [id number] DOB: 1933/03/27 Today's Date: 06/06/2017 Time: 8588-5027 SLP Time Calculation (min) (ACUTE ONLY): 45 min  Assessment / Plan / Recommendation Clinical Impression  Pt seen for ongoing assessment of swallowing and trials to upgrade his diet consistency as able. Pt has been tolerating the Nectar consistency liquids per NSG report. GI has signed off and gave verbal ok for introduction of solid foods post his recent EGD and clear liquid diet last week.  Pt remains on HFNC w/ 35% O2 support. He required upright positioning w/ pillows in the bed. Pt then consumed trials of Nectar liquids via Straw, purees, and mech soft/Minced foods w/ no overt s/s of aspiration immediately during/post trials - pt continues to present w/ a half/min cough during any exertion but this did not appear related to the po trials and it only occurred 2x during po's presented. No decline in O2 sats(93%), or RR/HR. Oral phase appeared grossly wfl for bolus management and A-P transfer; full oral clearing attainted w/ each bolus. Pt does take his time w/ increased mastication time/effort noted - Dtr reported similar at home. Pt required full feeding assistance w/ po's d/t overall weakness.  Recommend upgrade of diet w/ introduction of Dysphagia level 2 (MINCED foods) w/ Nectar liquids - strict aspiration precautions; Pills in Puree - Whole. Pt requires feeding assistance at meals. ST services will f/u w/ trials of thin liquids next 1-2 days to further upgrade diet as pt tolerates(do not recommend upgrading both liquid and food consistencies at the same time on the same day). Education on the above given to pt and Dtr; NSG. Discussed recommendations and POC; precautions posted. NSG updated and agreed.     HPI HPI: Pt is an 84 y/o male w/ past medical history of multiple medical issues including Gastric bleeding, cdiff, gastric varices(EGD on  06/01/17), pyloric stenosis, CHF, DM2, atrial fibrillation, COPD, hemorrhoids, hypertension and diastolic heart failure presents the emergency department complaining of weakness.  The patient's daughter states that his dramatic decline in strength started the day after his varicose vein laser ablation.  Today the patient slid out of bed.  He did not fall or hit his head.  He did not lose consciousness but felt lightheaded and weak.  The patient's primary doctor had given him a fecal occult blood test kit a few weeks ago as he had noticed the patient's blood count gradually dropping over time.  The patient has not seen any frank blood in his stool but admits to dark-colored bowel movements.  He denies abdominal pain or hematemesis.  The patient also denies chest pain but admits to chronic left shoulder pain.  Laboratory evaluation in the emergency department revealed elevated potassium as well as significantly worsened kidney function.  The patient was also hypotensive and bradycardic which prompted the emergency department staff to call the hospitalist service for admission. During this admission, and post GI assessment, pt has been placed on a Clear Liquid diet per GI. Noted CXR 06/02/17 w/ concern for Increasing right upper lobe atelectasis and right pleural effusion - of note, pt has had RLL in 2016(could be related to his GI issues?).      SLP Plan  Continue with current plan of care       Recommendations  Diet recommendations: Dysphagia 2 (fine chop);Nectar-thick liquid Liquids provided via: Cup;Straw Medication Administration: Whole meds with puree(for safer swallowing ) Supervision: Staff to assist with self feeding;Full supervision/cueing for compensatory  strategies Compensations: Minimize environmental distractions;Slow rate;Small sips/bites;Lingual sweep for clearance of pocketing;Multiple dry swallows after each bite/sip;Follow solids with liquid Postural Changes and/or Swallow Maneuvers:  Seated upright 90 degrees;Upright 30-60 min after meal                General recommendations: (Dietician f/u) Oral Care Recommendations: Oral care BID;Staff/trained caregiver to provide oral care Follow up Recommendations: Skilled Nursing facility(TBD) SLP Visit Diagnosis: Dysphagia, oropharyngeal phase (R13.12) Plan: Continue with current plan of care       Milligan, Talpa, Bricelyn 06/06/2017, 4:37 PM

## 2017-06-06 NOTE — Progress Notes (Signed)
RN spoke with Dr. Vicente Males and MD gave order that patient can eat solid foods.

## 2017-06-06 NOTE — Progress Notes (Signed)
Pre HD assessment    06/06/17 1600  Neurological  Level of Consciousness Alert  Orientation Level Oriented X4  Respiratory  Respiratory Pattern Regular;Unlabored  Chest Assessment Chest expansion symmetrical  Cough Non-productive  Cardiac  ECG Monitor Yes  Vascular  R Radial Pulse +2  L Radial Pulse +2  Edema Generalized;Right upper extremity;Left upper extremity;Right lower extremity;Left lower extremity  Integumentary  Integumentary (WDL) X  Skin Color Appropriate for ethnicity  Musculoskeletal  Musculoskeletal (WDL) X  Generalized Weakness Yes  Assistive Device None  Gastrointestinal  Bowel Sounds Assessment Active  GU Assessment  Genitourinary (WDL) X  Genitourinary Symptoms  (HD)  Psychosocial  Psychosocial (WDL) WDL  Patient Behaviors Cooperative;Calm;Appropriate for situation  Emotional support given Given to patient;Given to patient's family

## 2017-06-06 NOTE — Progress Notes (Signed)
PULMONARY / CRITICAL CARE MEDICINE   Name: [name] MRN: [id number] DOB: April 10, 1933    ADMISSION DATE:  05/30/2017 CONSULTATION DATE:  05/30/2017    HISTORY OF PRESENT ILLNESS:   [name] is a 84 y.o. white male with atrial fibrillation, congestive heart failure (no recent echo), COPD, hepatic cirrhosis, diabetes mellitus type II, gastric varices, history of bladder cancer status post ileal conduit, hypertension, hyperlipidemia who was admitted to [facility] on 05/30/2017 for GI bleed and acute renal failure. He has been started on CRRT.   REVIEW OF SYSTEMS:   CONSTITUTIONAL: No fever, fatigue or weakness.  EYES: No blurred or double vision.  EARS, NOSE, AND THROAT: No tinnitus or ear pain.  RESPIRATORY: No cough, shortness of breath, wheezing or hemoptysis.  CARDIOVASCULAR: No chest pain, orthopnea, edema.  GASTROINTESTINAL: No nausea, vomiting, diarrhea or abdominal pain.  GENITOURINARY: No dysuria, hematuria.  ENDOCRINE: No polyuria, nocturia,  HEMATOLOGY: No anemia, easy bruising or bleeding SKIN: No rash or lesion. MUSCULOSKELETAL: No joint pain or arthritis.   NEUROLOGIC: No tingling, numbness, weakness.  PSYCHIATRY: No anxiety or depression.     SUBJECTIVE:  Patient reports that he feels better.  VITAL SIGNS: BP (!) 127/42 (BP Location: Right Arm)   Pulse 79   Temp 98.4 F (36.9 C) (Oral)   Resp (!) 26   Ht 5\' 8"  (1.727 m)   Wt 270 lb 1 oz (122.5 kg)   SpO2 93%   BMI 41.06 kg/m   HEMODYNAMICS:  no compromise; on Dopamine  VENTILATOR SETTINGS: FiO2 (%):  [35 %-45 %] 35 %  INTAKE / OUTPUT: I/O last 3 completed shifts: In: 194 [I.V.:10; IV Piggyback:184] Out: 160 [Urine:160]  PHYSICAL EXAMINATION: GENERAL:  Comfortable without distress EYES: Pupils equal, round, reactive to light and accommodation. No scleral icterus. Extraocular muscles intact.  HEENT: Head atraumatic, normocephalic. Oropharynx and nasopharynx clear.  NECK:  Supple, no jugular venous  distention. No thyroid enlargement, no tenderness.  LUNGS: Normal breath sounds bilaterally, no wheezing, rales,rhonchi or crepitation. No use of accessory muscles of respiration.  CARDIOVASCULAR: S1, S2 normal. No murmurs, rubs, or gallops.  ABDOMEN: Soft, nontender, nondistended. Bowel sounds present. No organomegaly or mass.  EXTREMITIES: No pedal edema, cyanosis, or clubbing.  NEUROLOGIC: Cranial nerves II through XII are intact. Muscle strength 4/5 in all extremities. Sensation intact. Gait not checked.  PSYCHIATRIC: The patient is alert and oriented x 3.  SKIN: No obvious rash, lesion, or ulcer.      LABS:  BMET Recent Labs  Lab 06/05/17 1749 06/05/17 2218 06/06/17 0406  NA 136 136 137  K 4.4 4.3 4.2  CL 105 106 105  CO2 27 27 28   BUN 11 11 9   CREATININE 1.06 0.96 1.09  GLUCOSE 85 78 74    Electrolytes Recent Labs  Lab 06/05/17 1749 06/05/17 2218 06/06/17 0406 06/06/17 1727  CALCIUM 8.0* 7.8* 7.9*  --   MG 1.8 1.7 1.6*  --   PHOS 2.3* 2.2* 2.3* 2.1*    CBC Recent Labs  Lab 06/02/17 0323 06/03/17 0433 06/05/17 1451  WBC 6.9 5.8 3.7*  HGB 9.0* 9.8* 8.7*  HCT 27.2* 28.8* 26.2*  PLT 149* 160 73*    Coag's Recent Labs  Lab 05/31/17 0011  INR 1.44    Sepsis Markers Recent Labs  Lab 05/31/17 0005 06/02/17 0323 06/03/17 0433 06/04/17 0546 06/04/17 2140  LATICACIDVEN 1.4  --   --   --  1.0  PROCALCITON  --  0.29 0.20  0.17  --     ABG Recent Labs  Lab 06/04/17 1226 06/04/17 2116  PHART 7.31* 7.33*  PCO2ART 50* 51*  PO2ART 87 84    Liver Enzymes Recent Labs  Lab 05/31/17 0008  06/04/17 2140  06/05/17 1749 06/05/17 2218 06/06/17 0406  AST 45*  --  46*  --   --   --   --   ALT 16*  --  14*  --   --   --   --   ALKPHOS 110  --  113  --   --   --   --   BILITOT 1.0  --  1.6*  --   --   --   --   ALBUMIN 2.5*   < > 2.1*  2.2*   < > 2.1* 2.1* 2.1*   < > = values in this interval not displayed.    Cardiac Enzymes Recent Labs   Lab 05/31/17 0008 06/04/17 2140  TROPONINI <0.03 0.03*    Glucose Recent Labs  Lab 06/06/17 0010 06/06/17 0409 06/06/17 0736 06/06/17 0813 06/06/17 1102 06/06/17 1640  GLUCAP 74 71 68 115* 81 80    Imaging No results found.   CULTURES: MRSA PCR Neg  ANTIBIOTICS: 5/12 Unasyn  SIGNIFICANT EVENTS: Nil  LINES/TUBES: Urostomy  DISCUSSION: 84 y.o. male with a PMHx ofosteoarthritis, atrial fibrillation, congestive heart failure, chronic atrial for ablation, COPD, cryptogenic cirrhosis, diabetes mellitus type 2, gastric varices, history of bladder cancer status post ileal conduit, hypertension, hyperlipidemia,who was [facility] on 5/6/2019for evaluation of generalized weakness.  ASSESSMENT / PLAN: 1. Acute on chronic renal failure Secondary to ATN, ? Fluid overload CKD stage III baseline, baseline creatinine 1.8 Plan: as per Nephrologist to transition to intermittent HD  2. Acute sepsis with  shock due to Pneumonia Continue to wean off dopamine as tolerated All cultures negative thus far Plan: Continue on  IV vancomycin/Unasyn, check procalcitonin and de-escalate ABX  3. Acute hypoxic respiratory failure Most likely secondary to Pneumonia vs fluid overload, still on HFO 60% Plan: wean HFO  as tolerated  4. GI bleed Anemia of chronic disease Status post EGD noted for gastritis/varices by gastroenterology  Plan: ContinueIV PPI TWICE daily  5.COPD  Stable Breathing treatments as needed  6. Type 2 diabetes mellitus Stable on current regiment Plan: Target Glucose management    FAMILY  - Updates: Will be updated when available  Thank you for allowing me the privileges to care for this patient.  I have dedicated a total of 38 minutes in critical care time minus all appropriate exclusions.  Cammie Sickle, MD Pulmonary and [place] Pager: [phone]  06/06/2017, 7:23 PM

## 2017-06-06 NOTE — Progress Notes (Signed)
HD Tx ended. Tolerated we   06/06/17 1930  Vital Signs  Pulse Rate 84  Pulse Rate Source Monitor  Resp (!) 25  BP (!) 126/44  BP Location Right Arm  BP Method Automatic  Patient Position (if appropriate) Lying  Oxygen Therapy  SpO2 93 %  O2 Device HFNC  During Hemodialysis Assessment  HD Safety Checks Performed Yes  Dialysis Fluid Bolus Normal Saline  Bolus Amount (mL) 250 mL  Intra-Hemodialysis Comments Tx completed;Tolerated well  ll, UF goal met.

## 2017-06-06 NOTE — Progress Notes (Signed)
[place] [facility] [place]    MR#:  [id number]  DATE OF BIRTH:  19-Sep-1933  SUBJECTIVE:  CHIEF COMPLAINT:   Chief Complaint  Patient presents with  . Weakness  No events overnight, case discussed with intensivist, nephrology input appreciated  REVIEW OF SYSTEMS:  CONSTITUTIONAL: No fever, fatigue or weakness.  EYES: No blurred or double vision.  EARS, NOSE, AND THROAT: No tinnitus or ear pain.  RESPIRATORY: No cough, shortness of breath, wheezing or hemoptysis.  CARDIOVASCULAR: No chest pain, orthopnea, edema.  GASTROINTESTINAL: No nausea, vomiting, diarrhea or abdominal pain.  GENITOURINARY: No dysuria, hematuria.  ENDOCRINE: No polyuria, nocturia,  HEMATOLOGY: No anemia, easy bruising or bleeding SKIN: No rash or lesion. MUSCULOSKELETAL: No joint pain or arthritis.   NEUROLOGIC: No tingling, numbness, weakness.  PSYCHIATRY: No anxiety or depression.   ROS  DRUG ALLERGIES:  No Known Allergies  VITALS:  Blood pressure (!) 119/48, pulse 74, temperature (!) 97.4 F (36.3 C), temperature source Axillary, resp. rate (!) 24, height 5\' 8"  (1.727 m), weight 114.9 kg (253 lb 4.9 oz), SpO2 93 %.  PHYSICAL EXAMINATION:  GENERAL:  84 y.o.-year-old patient lying in the bed with no acute distress.  EYES: Pupils equal, round, reactive to light and accommodation. No scleral icterus. Extraocular muscles intact.  HEENT: Head atraumatic, normocephalic. Oropharynx and nasopharynx clear.  NECK:  Supple, no jugular venous distention. No thyroid enlargement, no tenderness.  LUNGS: Normal breath sounds bilaterally, no wheezing, rales,rhonchi or crepitation. No use of accessory muscles of respiration.  CARDIOVASCULAR: S1, S2 normal. No murmurs, rubs, or gallops.  ABDOMEN: Soft, nontender, nondistended. Bowel sounds present. No organomegaly or mass.  EXTREMITIES: No pedal edema, cyanosis, or clubbing.  NEUROLOGIC: Cranial nerves II through XII  are intact. Muscle strength 5/5 in all extremities. Sensation intact. Gait not checked.  PSYCHIATRIC: The patient is alert and oriented x 3.  SKIN: No obvious rash, lesion, or ulcer.   Physical Exam LABORATORY PANEL:   CBC Recent Labs  Lab 06/05/17 1451  WBC 3.7*  HGB 8.7*  HCT 26.2*  PLT 73*   ------------------------------------------------------------------------------------------------------------------  Chemistries  Recent Labs  Lab 06/04/17 2140  06/06/17 0406  NA 135   < > 137  K 4.6   < > 4.2  CL 106   < > 105  CO2 26   < > 28  GLUCOSE 90   < > 74  BUN 15   < > 9  CREATININE 1.17   < > 1.09  CALCIUM 7.9*   < > 7.9*  MG 1.7   < > 1.6*  AST 46*  --   --   ALT 14*  --   --   ALKPHOS 113  --   --   BILITOT 1.6*  --   --    < > = values in this interval not displayed.   ------------------------------------------------------------------------------------------------------------------  Cardiac Enzymes Recent Labs  Lab 05/31/17 0008 06/04/17 2140  TROPONINI <0.03 0.03*   ------------------------------------------------------------------------------------------------------------------  RADIOLOGY:  US Venous Img Upper Uni Right  Result Date: 06/05/2017 CLINICAL DATA:  84 year old male with a history of right upper arm swelling EXAM: RIGHT UPPER EXTREMITY VENOUS DOPPLER ULTRASOUND TECHNIQUE: Gray-scale sonography with graded compression, as well as color Doppler and duplex ultrasound were performed to evaluate the upper extremity deep venous system from the level of the subclavian vein and including the jugular, axillary, basilic, radial, ulnar and upper cephalic vein. Spectral Doppler was utilized  to evaluate flow at rest and with distal augmentation maneuvers. COMPARISON:  None. FINDINGS: Contralateral Subclavian Vein: Respiratory phasicity is normal and symmetric with the symptomatic side. No evidence of thrombus. Normal compressibility. Internal Jugular Vein: No  evidence of thrombus. Normal compressibility, respiratory phasicity and response to augmentation. Subclavian Vein: No evidence of thrombus. Normal compressibility, respiratory phasicity and response to augmentation. Axillary Vein: No evidence of thrombus. Normal compressibility, respiratory phasicity and response to augmentation. Cephalic Vein: No evidence of thrombus. Normal compressibility, respiratory phasicity and response to augmentation. Basilic Vein: No evidence of thrombus. Normal compressibility, respiratory phasicity and response to augmentation. Brachial Veins: No evidence of thrombus. Normal compressibility, respiratory phasicity and response to augmentation. Radial Veins: No evidence of thrombus. Normal compressibility, respiratory phasicity and response to augmentation. Ulnar Veins: No evidence of thrombus. Normal compressibility, respiratory phasicity and response to augmentation. Other Findings:  None visualized. IMPRESSION: Sonographic survey of the right upper extremity negative for DVT Electronically Signed   By: Corrie Mckusick D.O.   On: 06/05/2017 11:22   Dg Chest Port 1 View  Result Date: 06/05/2017 CLINICAL DATA:  Acute respiratory failure, history CHF EXAM: PORTABLE CHEST 1 VIEW COMPARISON:  Portable exam 0501 hours compared to 06/02/2017 FINDINGS: LEFT jugular line with tip projecting over SVC. Enlargement of cardiac silhouette with pulmonary vascular congestion. Atherosclerotic calcification aorta. Pericardial calcification again identified. Increased RIGHT pleural effusion and mild RIGHT basilar atelectasis. Diffuse interstitial infiltrates favor pulmonary edema. No pneumothorax. IMPRESSION: Persistent pulmonary edema. Increased RIGHT pleural effusion and mild RIGHT basilar atelectasis. Electronically Signed   By: Lavonia Dana M.D.   On: 06/05/2017 07:14    ASSESSMENT AND PLAN:  84 year old ill patient with history of for diabetes mellitus, COPD, hyperlipidemia, chronic kidney  disease currently in stepdown unit for hyperkalemia and worsening renal function  -Acute on chronic renal failure Secondary to ATN CKD stage III baseline, baseline creatinine 1.8 Nephrology input appreciated, on CRT  -Acute septic shock due to Pneumonia Continue to wean dopamine as tolerated, empiric IV vancomycin/Unasyn  Blood and urineCxnegtive  -Acute hypoxic respiratory failure Most likely secondary to above Continue to wean HFNC as tolerated  -Hyperkalemia  resolved with hemodialysis  -GI bleed Anemia of chronic disease Status post EGD noted for gastritis/varices by gastroenterology  ContinueIV PPI TWICE daily  -COPD  Stable Breathing treatments as needed  -Type 2 diabetes mellitus Stable on current regiment   All the records are reviewed and case discussed with Care Management/Social Workerr. Management plans discussed with the patient, family and they are in agreement.  CODE STATUS: full  TOTAL TIME TAKING CARE OF THIS PATIENT: 35 minutes.     POSSIBLE D/C IN 2-5 DAYS, DEPENDING ON CLINICAL CONDITION.   Avel Peace Latreshia Beauchaine M.D on 06/06/2017   Between 7am to 6pm - Pager - [phone]  After 6pm go to [email or web address] - password [email or web address] [facility]  Office  [phone]  CC: Primary care physician; Ria Bush, MD  Note: This dictation was prepared with Dragon dictation along with smaller phrase technology. Any transcriptional errors that result from this process are unintentional.

## 2017-06-06 NOTE — Progress Notes (Signed)
Post HD assessment, no change in LOC from initial, pt reports feeling better, was able to have ice cream and mashed potatoes.    06/06/17 1945  Neurological  Level of Consciousness Alert  Orientation Level Oriented X4  Respiratory  Respiratory Pattern Regular;Unlabored  Chest Assessment Chest expansion symmetrical  Cough Non-productive  Cardiac  ECG Monitor Yes  Vascular  R Radial Pulse +2  L Radial Pulse +2  Edema Generalized;Right upper extremity;Left upper extremity;Right lower extremity;Left lower extremity  Integumentary  Integumentary (WDL) X  Skin Color Appropriate for ethnicity  Musculoskeletal  Musculoskeletal (WDL) X  Generalized Weakness Yes  Gastrointestinal  Bowel Sounds Assessment Active  GU Assessment  Genitourinary (WDL) X  Genitourinary Symptoms  (HD)  Psychosocial  Psychosocial (WDL) WDL  Patient Behaviors Cooperative;Calm;Appropriate for situation  Emotional support given Given to patient;Given to patient's family

## 2017-06-07 ENCOUNTER — Inpatient Hospital Stay: Payer: PPO

## 2017-06-07 ENCOUNTER — Other Ambulatory Visit: Payer: Self-pay

## 2017-06-07 ENCOUNTER — Inpatient Hospital Stay (HOSPITAL_COMMUNITY)
Admit: 2017-06-07 | Discharge: 2017-06-07 | Disposition: A | Discharge status: Home / Self Care | Payer: PPO | Attending: Pulmonary Disease | Admitting: Pulmonary Disease

## 2017-06-07 DIAGNOSIS — J810 Acute pulmonary edema: Secondary | ICD-10-CM

## 2017-06-07 DIAGNOSIS — J9601 Acute respiratory failure with hypoxia: Secondary | ICD-10-CM

## 2017-06-07 DIAGNOSIS — R609 Edema, unspecified: Secondary | ICD-10-CM

## 2017-06-07 DIAGNOSIS — R531 Weakness: Secondary | ICD-10-CM

## 2017-06-07 DIAGNOSIS — I5021 Acute systolic (congestive) heart failure: Secondary | ICD-10-CM

## 2017-06-07 LAB — RENAL FUNCTION PANEL
ANION GAP: 3 — AB (ref 5–15)
Albumin: 2 g/dL — ABNORMAL LOW (ref 3.5–5.0)
BUN: 7 mg/dL (ref 6–20)
CALCIUM: 7.2 mg/dL — AB (ref 8.9–10.3)
CO2: 29 mmol/L (ref 22–32)
Chloride: 106 mmol/L (ref 101–111)
Creatinine, Ser: 1.03 mg/dL (ref 0.61–1.24)
GFR calc Af Amer: >60 mL/min (ref >60)
GFR calc non Af Amer: >60 mL/min (ref >60)
GLUCOSE: 78 mg/dL (ref 65–99)
Phosphorus: 2 mg/dL — ABNORMAL LOW (ref 2.5–4.6)
Potassium: 3.7 mmol/L (ref 3.5–5.1)
SODIUM: 138 mmol/L (ref 135–145)

## 2017-06-07 LAB — HEPATITIS B SURFACE ANTIGEN: HEP B S AG: NEGATIVE

## 2017-06-07 LAB — GLUCOSE, CAPILLARY
Glucose-Capillary: 102 mg/dL — ABNORMAL HIGH (ref 65–99)
Glucose-Capillary: 107 mg/dL — ABNORMAL HIGH (ref 65–99)
Glucose-Capillary: 70 mg/dL (ref 65–99)
Glucose-Capillary: 74 mg/dL (ref 65–99)
Glucose-Capillary: 81 mg/dL (ref 65–99)
Glucose-Capillary: 90 mg/dL (ref 65–99)

## 2017-06-07 LAB — HEPATITIS B CORE ANTIBODY, TOTAL: Hep B Core Total Ab: NEGATIVE

## 2017-06-07 LAB — MAGNESIUM: MAGNESIUM: 1.7 mg/dL (ref 1.7–2.4)

## 2017-06-07 LAB — HEPATITIS B SURFACE ANTIBODY,QUALITATIVE: Hep B S Ab: NONREACTIVE

## 2017-06-07 LAB — ECHOCARDIOGRAM COMPLETE
Height: 68 in
Weight: 4211.67 [oz_av]

## 2017-06-07 LAB — PHOSPHORUS: PHOSPHORUS: 2 mg/dL — AB (ref 2.5–4.6)

## 2017-06-07 LAB — PROCALCITONIN: Procalcitonin: 0.18 ng/mL

## 2017-06-07 MED ORDER — PANTOPRAZOLE SODIUM 40 MG PO TBEC
40.0000 mg | DELAYED_RELEASE_TABLET | Freq: Two times a day (BID) | ORAL | Status: DC
  Administered 2017-06-07 – 2017-06-16 (×19): 40 mg via ORAL
  Filled 2017-06-07 (×19): qty 1

## 2017-06-07 MED ORDER — MAGNESIUM SULFATE 2 GM/50ML IV SOLN
2.0000 g | Freq: Once | INTRAVENOUS | Status: AC
  Administered 2017-06-07: 2 g via INTRAVENOUS
  Filled 2017-06-07: qty 50

## 2017-06-07 MED ORDER — POTASSIUM PHOSPHATES 15 MMOLE/5ML IV SOLN
30.0000 mmol | Freq: Once | INTRAVENOUS | Status: DC
  Filled 2017-06-07: qty 10

## 2017-06-07 MED ORDER — SODIUM CHLORIDE 0.9 % IV SOLN: 3.0000 g | Freq: Two times a day (BID) | INTRAVENOUS | Status: DC

## 2017-06-07 MED ORDER — POTASSIUM PHOSPHATES 15 MMOLE/5ML IV SOLN
20.0000 mmol | Freq: Once | INTRAVENOUS | Status: AC
  Administered 2017-06-07: 20 mmol via INTRAVENOUS
  Filled 2017-06-07: qty 6.67

## 2017-06-07 NOTE — Progress Notes (Signed)
SLP Cancellation Note  Patient Details Name: [name] MRN: [id number] DOB: [birth year]   Cancelled treatment:       Reason Eval/Treat Not Completed: Patient at procedure or test/unavailable(chart reviewed; met w/ Dtr and pt. NSG consulted.). Pt was beginning assessment for HD. Dtr stated pt ate "a couple of bites" then didn't want anything more.  ST services unable to see pt for po trials in order to attempt to upgrade diet consistency during HD. ST will f/u tomorrow w/ such trials. Dtr and NSG agreed. Dinner meal items(preferred by pt) chosen that might encourage pt's oral intake.   Orinda Kenner, [place], CCC-SLP Imri Lor 06/07/2017, 2:06 PM

## 2017-06-07 NOTE — Progress Notes (Signed)
No distress on HFNC 40% No complaints of pain or dyspnea  Vitals:   06/07/17 1330 06/07/17 1345 06/07/17 1400 06/07/17 1415  BP: (!) 131/42 (!) 117/46 (!) 131/39 (!) 132/44  Pulse: 74 71 73 69  Resp: 18 (!) 27 (!) 29 (!) 25  Temp:      TempSrc:      SpO2: 98% 98% 98% 98%  Weight:      Height:      HFNC 40 %   Somewhat frail-appearing HEENT WNL JVP not visualized Diminished BS in both bases No wheezes RRR, no M Abdomen soft, NABS Diffuse bilateral upper and lower extremity edema No focal neurologic deficits  BMP Latest Ref Rng & Units 06/07/2017 06/06/2017 06/05/2017  Glucose 65 - 99 mg/dL 78 74 78  BUN 6 - 20 mg/dL 7 9 11   Creatinine 0.61 - 1.24 mg/dL 1.03 1.09 0.96  Sodium 135 - 145 mmol/L 138 137 136  Potassium 3.5 - 5.1 mmol/L 3.7 4.2 4.3  Chloride 101 - 111 mmol/L 106 105 106  CO2 22 - 32 mmol/L 29 28 27   Calcium 8.9 - 10.3 mg/dL 7.2(L) 7.9(L) 7.8(L)    CBC Latest Ref Rng & Units 06/05/2017 06/03/2017 06/02/2017  WBC 3.8 - 10.6 K/uL 3.7(L) 5.8 6.9  Hemoglobin 13.0 - 18.0 g/dL 8.7(L) 9.8(L) 9.0(L)  Hematocrit 40.0 - 52.0 % 26.2(L) 28.8(L) 27.2(L)  Platelets 150 - 440 K/uL 73(L) 160 149(L)   CXR: Edema pattern with R >L effusions  IMP: Acute hypoxemic respiratory failure Pulmonary edema Pleural effusions-likely transudates due to severe volume overload History of COPD, followed by Dr. Raul Del Minimally elevated PCT, doubt pneumonia. Doubt severe sepsis/shock Suspect cardiomyopathy AKI/CKD Severe hypervolemia, anasarca GI bleed, clinically resolved   - Status postEGD 05/08 - gastritis/varices noted Acute on chronic anemia-multifactorial  PLAN: Discussed with Dr. Juleen China.  Ultrafiltration planned for today Wean O2 as able maintaining SPO2 >90% Follow CXR Check EKG today Echocardiogram ordered Follow BNP Discontinue Unasyn DVT px: SCDs Monitor CBC intermittently Transfuse per usual guidelines  Palliative care following  Once his O2 requirements improve,  he may be transferred to [place] floor with cardiac monitoring  Merton Border, MD PCCM service Mobile [phone] Pager [phone] 06/07/2017 2:39 PM

## 2017-06-07 NOTE — Progress Notes (Signed)
[facility]  ROUNDING NOTE   Subjective:   UOP 300  Hemodialysis treatment yesterday. UF of 0.5 liters  HFNC   Objective:  Vital signs in last 24 hours:  Temp:  [97.9 F (36.6 C)-98.5 F (36.9 C)] 97.9 F (36.6 C) (05/14 0851) Pulse Rate:  [66-85] 71 (05/14 1100) Resp:  [14-33] 17 (05/14 1100) BP: (114-133)/(41-63) 123/41 (05/14 1100) SpO2:  [90 %-96 %] 95 % (05/14 1100) FiO2 (%):  [35 %-55 %] 45 % (05/14 0749) Weight:  [114.6 kg (252 lb 10.4 oz)-122.5 kg (270 lb 1 oz)] 114.6 kg (252 lb 10.4 oz) (05/14 0500)  Weight change:  Filed Weights   06/06/17 1615 06/06/17 1945 06/07/17 0500  Weight: 122.5 kg (270 lb 1 oz) 122 kg (268 lb 15.4 oz) 114.6 kg (252 lb 10.4 oz)    Intake/Output: I/O last 3 completed shifts: In: 360 [I.V.:10; IV Piggyback:350] Out: 478 [Urine:330; Other:501]   Intake/Output this shift:  Total I/O In: 656 [IV Piggyback:656] Out: -   Physical Exam: General: Critically Ill  Head: Normocephalic, atraumatic.   Eyes: Anicteric  Neck: Supple, trachea midline  Lungs:  Scattered rhonchi, HFNC  Heart: regular  Abdomen:  Soft, nontender, bowel sounds present, urostomy in place  Extremities: 2+ peripheral edema in all four extremities  Neurologic: Alert and oriented  Skin: No lesions  Access: Left IJ temporary dialysis catheter    Basic Metabolic Panel: Recent Labs  Lab 06/05/17 1451 06/05/17 1749 06/05/17 2218 06/06/17 0406 06/06/17 1727 06/07/17 0347  NA 135 136 136 137  --  138  K 4.3 4.4 4.3 4.2  --  3.7  CL 106 105 106 105  --  106  CO2 25 27 27 28   --  29  GLUCOSE 87 85 78 74  --  78  BUN 11 11 11 9   --  7  CREATININE 1.05 1.06 0.96 1.09  --  1.03  CALCIUM 7.7* 8.0* 7.8* 7.9*  --  7.2*  MG 1.7 1.8 1.7 1.6*  --  1.7  PHOS 2.6 2.3* 2.2* 2.3* 2.1* 2.0*  2.0*    Liver Function Tests: Recent Labs  Lab 06/04/17 2140  06/05/17 1451 06/05/17 1749 06/05/17 2218 06/06/17 0406 06/07/17 0347  AST 46*  --   --   --   --    --   --   ALT 14*  --   --   --   --   --   --   ALKPHOS 113  --   --   --   --   --   --   BILITOT 1.6*  --   --   --   --   --   --   PROT 5.1*  --   --   --   --   --   --   ALBUMIN 2.1*  2.2*   < > 2.1* 2.1* 2.1* 2.1* 2.0*   < > = values in this interval not displayed.   No results for input(s): LIPASE, AMYLASE in the last 168 hours. Recent Labs  Lab 06/04/17 2140  AMMONIA 50*    CBC: Recent Labs  Lab 05/31/17 2145 06/02/17 0323 06/03/17 0433 06/05/17 1451  WBC 4.8 6.9 5.8 3.7*  NEUTROABS  --  4.9 3.6 2.5  HGB 8.9* 9.0* 9.8* 8.7*  HCT 27.1* 27.2* 28.8* 26.2*  MCV 96.1 96.3 95.7 93.7  PLT 125* 149* 160 73*    Cardiac Enzymes: Recent Labs  Lab 06/04/17  2140  TROPONINI 0.03*    BNP: Invalid input(s): POCBNP  CBG: Recent Labs  Lab 06/06/17 2220 06/07/17 0021 06/07/17 0342 06/07/17 0730 06/07/17 1137  GLUCAP 86 81 74 70 90    Microbiology: Results for orders placed or performed during the hospital encounter of 05/30/17  Culture, blood (routine x 2)     Status: None   Collection Time: 05/31/17 12:05 AM  Result Value Ref Range Status   Specimen Description BLOOD RIGHT HAND  Final   Special Requests   Final    BOTTLES DRAWN AEROBIC AND ANAEROBIC Blood Culture adequate volume   Culture   Final    NO GROWTH 5 DAYS Performed at [facility], [address] [place], [place] [address]    Report Status 06/05/2017 FINAL  Final  Urine culture     Status: Abnormal   Collection Time: 05/31/17 12:05 AM  Result Value Ref Range Status   Specimen Description   Final    URINE, RANDOM Performed at [facility], [address] [place], [place] [address]    Special Requests   Final    NONE Performed at [facility], [address] [place], [place] [address]    Culture MULTIPLE SPECIES PRESENT, SUGGEST RECOLLECTION (A)  Final   Report Status 06/01/2017 FINAL  Final  Culture, blood (routine x 2)     Status: None   Collection  Time: 05/31/17 12:08 AM  Result Value Ref Range Status   Specimen Description BLOOD LEFT AC  Final   Special Requests   Final    BOTTLES DRAWN AEROBIC AND ANAEROBIC Blood Culture adequate volume   Culture   Final    NO GROWTH 5 DAYS Performed at [facility], [address] [place], [place] [address]    Report Status 06/05/2017 FINAL  Final  MRSA PCR Screening     Status: None   Collection Time: 05/31/17  9:47 AM  Result Value Ref Range Status   MRSA by PCR NEGATIVE NEGATIVE Final    Comment:        The GeneXpert MRSA Assay (FDA approved for NASAL specimens only), is one component of a comprehensive MRSA colonization surveillance program. It is not intended to diagnose MRSA infection nor to guide or monitor treatment for MRSA infections. Performed at [facility], [address], [place], [place] [address]   Urine Culture     Status: Abnormal   Collection Time: 06/02/17  5:27 PM  Result Value Ref Range Status   Specimen Description   Final    URINE, RANDOM Performed at [facility], [address], [place], [place] [address]    Special Requests   Final    NONE Performed at [facility], [address] [place], [place] [address]    Culture (A)  Final    <10,000 COLONIES/mL INSIGNIFICANT GROWTH Performed at [facility] [address] [place], [place] [address]    Report Status 06/03/2017 FINAL  Final    Coagulation Studies: No results for input(s): LABPROT, INR in the last 72 hours.  Urinalysis: No results for input(s): COLORURINE, LABSPEC, PHURINE, GLUCOSEU, HGBUR, BILIRUBINUR, KETONESUR, PROTEINUR, UROBILINOGEN, NITRITE, LEUKOCYTESUR in the last 72 hours.  Invalid input(s): APPERANCEUR    Imaging: Dg Chest Port 1 View  Result Date: 06/07/2017 CLINICAL DATA:  Pneumonia EXAM: PORTABLE CHEST 1 VIEW COMPARISON:  Two days ago FINDINGS: Central line with tip at the SVC origin. Diffuse interstitial coarsening with  layering pleural  effusions greater on the right. Stable borderline cardiomegaly. No pneumothorax. Artifact from EKG leads. IMPRESSION: Pulmonary edema and layering pleural effusions. No change from 2 days prior. Electronically Signed   By: Monte Fantasia M.D.   On: 06/07/2017 07:12     Medications:   . potassium PHOSPHATE IVPB (mmol) 20 mmol (06/07/17 1049)   . docusate sodium  100 mg Oral BID  . ferrous sulfate  325 mg Oral Q breakfast  . insulin aspart  0-5 Units Subcutaneous QHS  . insulin aspart  0-9 Units Subcutaneous TID WC  . magnesium oxide  400 mg Oral Daily  . nystatin   Topical TID  . pantoprazole  40 mg Oral BID  . simvastatin  20 mg Oral QHS  . sodium chloride flush  10-40 mL Intracatheter Q12H  . umeclidinium-vilanterol  1 puff Inhalation Daily  . vitamin B-12  250 mcg Oral Daily   acetaminophen **OR** [DISCONTINUED] acetaminophen, albuterol, morphine injection, [DISCONTINUED] ondansetron **OR** ondansetron (ZOFRAN) IV, sodium chloride flush  Assessment/ Plan:  [name] is a 84 y.o. white male with atrial fibrillation, congestive heart failure (no recent echo), COPD, hepatic cirrhosis, diabetes mellitus type II, gastric varices, history of bladder cancer status post ileal conduit, hypertension, hyperlipidemia who was admitted to [facility] on 05/30/2017 for GI bleed and acute renal failure. Placed on CRRT from 5/8 to 5/9, then on 5/9 had intermitted hemodialysis treatment, restarted on CRRT  5/10 to 5/13. Intermittent hemodialysis on 5/13.   1.  Acute renal failure with hyperkalemia n chronic kidney disease stage III baseline creatinine of 1.8 GFR of 38 on 04/19/17.  Acute renal failure secondary to ATN. Oliguric urine output.  Chronic kidney disease secondary to obstructive uropathy, hypertension, and diabetes.  - Requiring renal replacement therapy since admission.  - Transitioned from CRRT to intermittent hemodialysis yesterday. Intermitted hemodialysis treatment for  later today. Sequential only.  - Monitor daily for dialysis needs.   2.  Anemia of chronic kidney disease with thrombocytopenia: hemoglobin 8.7. GI bleed on amdisssion. Endoscopy on 5/8 by Dr. Vicente Males finding gastric varices  - Iron supplements - pantoprazole  3. Hypertension with atrial fibrillation: heart rate and blood pressure at goal.   4. Acute respiratory failure: requiring HFNC. With pneumonia, right pleural effusion and pulmonary edema.  - Unasyn - renally dose.    LOS: 7 [name] 5/14/201912:00 PM

## 2017-06-07 NOTE — Progress Notes (Signed)
Pre HD assessment   06/07/17 1245  Vital Signs  Temp 98.6 F (37 C)  Temp Source Oral  Pulse Rate 78  Pulse Rate Source Monitor  Resp (!) 21  BP (!) 129/43  BP Location Right Arm  BP Method Automatic  Patient Position (if appropriate) Lying  Oxygen Therapy  SpO2 93 %  Pain Assessment  Pain Scale 0-10  Pain Score 0  Dialysis Weight  Weight 119.4 kg (263 lb 3.7 oz)  Type of Weight Pre-Dialysis  Time-Out for Hemodialysis  What Procedure? HD  Pt Identifiers(min of two) First/Last Name;MRN/Account#  Correct Site? Yes  Correct Side? Yes  Correct Procedure? Yes  Consents Verified? Yes  Rad Studies Available? N/A  Safety Precautions Reviewed? Yes  Engineer, civil (consulting) Number  (5A)  Station Number  (bedside ICU 06)  UF/Alarm Test Passed  Conductivity: Meter 13.8  Conductivity: Machine  14  pH 7.6  Reverse Osmosis  [phone])  Normal Saline Lot Number [id number]  Dialyzer Lot Number [id number]  Disposable Set Lot Number [id number]  Machine Temperature 98.6 F (37 C)  Musician and Audible Yes  Blood Lines Intact and Secured Yes  Pre Treatment Patient Checks  Vascular access used during treatment Catheter  Hepatitis B Surface Antigen Results Negative  Date Hepatitis B Surface Antigen Drawn 06/06/17  Hepatitis B Surface Antibody  (<10)  Date Hepatitis B Surface Antibody Drawn 06/06/17  Hemodialysis Consent Verified Yes  Hemodialysis Standing Orders Initiated Yes  ECG (Telemetry) Monitor On Yes  Prime Ordered Normal Saline  Length of  DialysisTreatment -hour(s) 3 Hour(s)  Dialyzer Elisio 17H NR  Dialysate 3K, 2.5 Ca  Dialysis Anticoagulant None  Dialysate Flow Ordered  (SEQ)  Blood Flow Rate Ordered 400 mL/min  Ultrafiltration Goal 3 Liters  Pre Treatment Labs  (BMP, CMP, BNP, Procalcitonin)  Dialysis Blood Pressure Support Ordered Normal Saline  Education / Care Plan  Dialysis Education Provided Yes  Documented Education in Care Plan Yes   Hemodialysis Catheter Left Internal jugular Triple-lumen  Placement Date/Time: 05/31/17 1200   Placed prior to admission: No  Time Out: Correct patient;Correct site;Correct procedure  Maximum sterile barrier precautions: Hand hygiene;Sterile gloves;Cap;Large sterile sheet;Mask;Sterile gown  Site Prep: Chlorh...  Site Condition No complications  Blue Lumen Status Heparin locked  Red Lumen Status Heparin locked  Purple Lumen Status N/A  Dressing Type Biopatch  Dressing Status Clean;Dry;Intact  Drainage Description None

## 2017-06-07 NOTE — Progress Notes (Signed)
*  PRELIMINARY RESULTS* Echocardiogram 2D Echocardiogram has been performed.  [name] [place] 06/07/2017, 3:37 PM

## 2017-06-07 NOTE — Progress Notes (Signed)
Daily Progress Note   Patient Name: [name]       Date: 06/07/2017 DOB: 1933-09-12  Age: 84 y.o. MRN#: [id number] Attending Physician: Bettey Costa, MD Primary Care Physician: Ria Bush, MD Admit Date: 05/30/2017  Reason for Consultation/Follow-up: Establishing goals of care  Subjective: Patient awake, alert and oriented x3 in bed receiving CRRT. Daughter [name] at the bedside. Patient denies any pain/discomfort. Currently maintaining oxygen saturations on 4L/[place]. States he still feels like he can't take a deep breath.   I met at the bedside with patient and his daughter. We reviewed their goals of care discussion on yesterday with my colleague Leonard Downing, NP and I provided updates on his health situation as of today. Both patient and daughter was able to summarize their discussion and did not have any questions. We discussed how he felt in regards to dialysis and if it was required long-term how he would feel.  Patient verbalized he does not think that he would like to have dialysis on a more long-term basis.  He verbalized that just receiving CRRT was uncomfortable and he was not such a big fan, and could not see himself doing this multiple times a week for years to come.  Daughter verbalized her agreement with his wishes and stated hopefully it would not come to this.  We also discussed the potential need for rehab once discharged.  Patient began shaking his head and stated I would not be going to any rehab facility, however I will consider having therapy in my own home.  Daughter agree with her father and stated she is a CNA and has worked in multiple facilities and would not wish for him to be placed in one even if it is for rehab.  She stated they were made accommodations within the home if  necessary.  Both Mr. Carden and his daughter remain hopeful that he will improve and be able to return home and regain some strength.  Daughter verbalized that they completed his advance directives on yesterday which he was very appreciative of.  In light of his advanced directives we discussed his wishes in regards to his CODE STATUS.  Patient has requested to be a DNR/DNI.  He states he would not want any life-sustaining measures or CPR.  He verbalized "if I was to die just let  me go ahead and die because I know that I will be worse off after the entire process is over, if I even made it".  Daughter was somewhat tearful during his statement, however she shook her head and said that she agreed with his decision.  Patient and daughter is aware that DNR status will be placed in the computer and that the nurse will place a bracelet on his arm.  He was assured that this would not change the level of care that he was receiving, and that by changing his code status at his request; this gives the medical team direction in the event he passed away.  Both patient and daughter verbalized understanding. All questions were answered and family is aware to contact our team if they have any further questions or needs.   Palliative will continue to support patient, family, and medical team.   Chart reviewed and report received by bedside RN.  Length of Stay: 7  Current Medications: Scheduled Meds:  . docusate sodium  100 mg Oral BID  . ferrous sulfate  325 mg Oral Q breakfast  . insulin aspart  0-5 Units Subcutaneous QHS  . insulin aspart  0-9 Units Subcutaneous TID WC  . magnesium oxide  400 mg Oral Daily  . nystatin   Topical TID  . pantoprazole  40 mg Oral BID  . simvastatin  20 mg Oral QHS  . sodium chloride flush  10-40 mL Intracatheter Q12H  . umeclidinium-vilanterol  1 puff Inhalation Daily  . vitamin B-12  250 mcg Oral Daily    Continuous Infusions: . potassium PHOSPHATE IVPB (mmol) 20 mmol  (06/07/17 1049)    PRN Meds: acetaminophen **OR** [DISCONTINUED] acetaminophen, albuterol, morphine injection, [DISCONTINUED] ondansetron **OR** ondansetron (ZOFRAN) IV, sodium chloride flush  Physical Exam  Constitutional: Vital signs are normal. He appears well-developed. He is cooperative. He has a sickly appearance.  Neck:  Left jugular central line   Cardiovascular: Normal rate, regular rhythm, normal heart sounds, intact distal pulses and normal pulses.  Pulmonary/Chest: He has decreased breath sounds in the right lower field and the left lower field.  Some shortness of breath during conversation.   Abdominal: Soft. Bowel sounds are normal.  Musculoskeletal:  Bilateral lower edema   Neurological: He is alert.  Psychiatric: He has a normal mood and affect. His speech is normal and behavior is normal. Judgment and thought content normal. Cognition and memory are normal.             Vital Signs: BP (!) 122/42 (BP Location: Right Arm)   Pulse 66   Temp 97.7 F (36.5 C) (Oral)   Resp 15   Ht 5' 8"  (1.727 m)   Wt 119.4 kg (263 lb 3.7 oz)   SpO2 99%   BMI 40.02 kg/m  SpO2: SpO2: 99 % O2 Device: O2 Device: Nasal Cannula O2 Flow Rate: O2 Flow Rate (L/min): 4 L/min  Intake/output summary:   Intake/Output Summary (Last 24 hours) at 06/07/2017 1634 Last data filed at 06/07/2017 1408 Gross per 24 hour  Intake 1096 ml  Output 801 ml  Net 295 ml   LBM: Last BM Date: 06/07/17 Baseline Weight: Weight: 97.5 kg (215 lb) Most recent weight: Weight: 119.4 kg (263 lb 3.7 oz)       Palliative Assessment/Data:PPS 20%      Patient Active Problem List   Diagnosis Date Noted  . Cirrhosis of liver with ascites ([place])   . Palliative care by specialist   . Advance  care planning   . Acute kidney injury superimposed on chronic kidney disease ([place]) 05/31/2017  . Varicose veins of both legs with edema 11/26/2016  . Bilateral leg weakness 10/15/2016  . Cold intolerance 12/16/2015  .  Generalized weakness 09/09/2015  . Health maintenance examination 03/12/2015  . Psoriasis 09/09/2014  . Advanced care planning/counseling discussion 02/12/2014  . Presence of urostomy ([place]) 08/02/2013  . Medicare annual wellness visit, subsequent 02/09/2013  . Leg edema 07/05/2012  . Radicular leg pain 12/07/2011  . Cirrhosis, cryptogenic ([place]) 07/06/2011  . Anti-Duffy a antibodies present 07/06/2011  . Duodenal nodule 07/06/2011  . Gastric varices 06/30/2011  . Gastric and duodenal angiodysplasia 06/28/2011  . Diastolic CHF ([place]) 87/68/1157  . Neoplasm of uncertain behavior of skin 07/30/2010  . COPD, severe ([place]) 07/30/2010  . Atrial fibrillation ([place]) 06/23/2010  . Pancytopenia ([place]) 11/28/2007  . Iron deficiency anemia 04/03/2007  . INGUINAL HERNIA, LEFT 04/03/2007  . HEMORRHOIDS, INTERNAL 10/26/2006  . Controlled diabetes mellitus type 2 with complications ([place]) 26/20/3559  . HLD (hyperlipidemia) 07/14/2006  . Essential hypertension 07/14/2006  . AORTIC INSUFFICIENCY 07/14/2006    Palliative Care Assessment & Plan   Patient Profile: 84 y.o. male  with past medical history of cryptongenic cirrhosis (w/ history of hypoalbuminemia), DM2, iron deficiency anemia, pancytopenia (presumed d/t cirrhosis), bladder cancer s/p cystectomy (very remote), HTN, severe COPD, Gastric and duodenal hyperdysplasia, a. Fib, CHF, CKD III, admitted on 05/30/2017 weakness, bradycardia, hypotension. Workup found him to be in acute on chronic renal failure- ?from dehydration r/t hemorrhagic gastritis discovered on endoscopy as well as septic shock d/t pneumonia. Renal u/s showed ascites. He has required CRRT- is off now and attempts are being made at intermittent dialysis. He is currently on HF nasal cannula with FIO2 at 35%. Chest xray shows ongoing pulmonary edema. Palliative medicine consulted for "GOC- Progressive decline despite treatment".    Recommendations/Plan:  DNR/DNI at patient's  request  Continue to treat the treatable  Patient verbalized that he doesn't think he is interested in long-term dialysis if required and is also not interested in a facility for rehab. Would prefer to go home with rehab if required.  Would recommend outpatient palliative services at min. At discharge  Palliative Medicine team will continue to support patient, family, and medical team during hospitalization.   Goals of Care and Additional Recommendations:  Limitations on Scope of Treatment: Full Scope Treatment  Code Status:    Code Status Orders  (From admission, onward)        Start     Ordered   06/07/17 1603  Do not attempt resuscitation (DNR)  Continuous    Question Answer Comment  In the event of cardiac or respiratory ARREST Do not call a "code blue"   In the event of cardiac or respiratory ARREST Do not perform Intubation, CPR, defibrillation or ACLS   In the event of cardiac or respiratory ARREST Use medication by any route, position, wound care, and other measures to relive pain and suffering. May use oxygen, suction and manual treatment of airway obstruction as needed for comfort.      06/07/17 1602    Code Status History    Date Active Date Inactive Code Status Order ID Comments User Context   05/31/2017 0329 06/07/2017 1602 Full Code [id number]  Harrie Foreman, MD ED       Prognosis:   Unable to determine-guarded to poor in the setting of chronic kidney disease, acute on chronic anemia, pulmonary edema, acute hypoxemic  respiratory failure, decreased immobility, COPD, CRRT, cirrhosis, diabetes, CHF, and generalized weakness.  Discharge Planning:  To Be Determined  Care plan was discussed with patient, family, bedside RN, text page Dr. Benjie Karvonen.   Thank you for allowing the Palliative Medicine Team to assist in the care of this patient.   Total Time 45 min.  Prolonged Time Billed  NO      Greater than 50%  of this time was spent counseling and coordinating  care related to the above assessment and plan.  Alda Lea, NP-BC Palliative Medicine Team  Phone: [phone] Fax: [phone]  Please contact Palliative Medicine Team phone at [phone] for questions and concerns.

## 2017-06-07 NOTE — Progress Notes (Signed)
HD tx start    06/07/17 1321  Vital Signs  Pulse Rate 69  Pulse Rate Source Monitor  Resp 16  BP (!) 129/44  BP Location Right Arm  BP Method Automatic  Patient Position (if appropriate) Lying  Oxygen Therapy  SpO2 99 %  Pre Treatment Patient Checks  Dialysis Treatment Comments SEQ only tx   During Hemodialysis Assessment  Blood Flow Rate (mL/min) 400 mL/min  Arterial Pressure (mmHg) -130 mmHg  Venous Pressure (mmHg) 130 mmHg  Transmembrane Pressure (mmHg) 10 mmHg  Ultrafiltration Rate (mL/min) 1000 mL/min  Dialysate Flow Rate (mL/min)  (SEQ)  Conductivity: Machine  14.3  HD Safety Checks Performed Yes  Dialysis Fluid Bolus Normal Saline  Bolus Amount (mL) 250 mL  Intra-Hemodialysis Comments Tx initiated  Hemodialysis Catheter Left Internal jugular Triple-lumen  Placement Date/Time: 05/31/17 1200   Placed prior to admission: No  Time Out: Correct patient;Correct site;Correct procedure  Maximum sterile barrier precautions: Hand hygiene;Sterile gloves;Cap;Large sterile sheet;Mask;Sterile gown  Site Prep: Chlorh...  Blue Lumen Status Infusing  Red Lumen Status Infusing

## 2017-06-07 NOTE — Progress Notes (Signed)
Pre HD assessment    06/07/17 1250  Neurological  Level of Consciousness Alert  Orientation Level Oriented X4  Respiratory  Respiratory Pattern Regular;Unlabored  Chest Assessment Chest expansion symmetrical  Cough Non-productive  Cardiac  ECG Monitor Yes  Vascular  R Radial Pulse +2  L Radial Pulse +2  Edema Generalized;Right upper extremity;Left upper extremity;Right lower extremity;Left lower extremity  Integumentary  Integumentary (WDL) X  Skin Color Appropriate for ethnicity  Musculoskeletal  Musculoskeletal (WDL) X  Generalized Weakness Yes  GU Assessment  Genitourinary (WDL) X  Genitourinary Symptoms  (HD)  Psychosocial  Psychosocial (WDL) X  Patient Behaviors Cooperative;Calm  Emotional support given Given to patient;Given to patient's family

## 2017-06-07 NOTE — Progress Notes (Signed)
Pt HD assessment. Pt tolerated tx well without c/o or complications. Net UF 3011, goal met.    06/07/17 1634  Vital Signs  Temp 97.7 F (36.5 C)  Temp Source Oral  Pulse Rate 66  Pulse Rate Source Monitor  Resp 15  BP (!) 122/42  BP Location Right Arm  BP Method Automatic  Patient Position (if appropriate) Lying  Oxygen Therapy  SpO2 99 %  Dialysis Weight  Weight 115.5 kg (254 lb 10.1 oz)  Type of Weight Post-Dialysis  Post-Hemodialysis Assessment  Rinseback Volume (mL) 250 mL  Dialyzer Clearance Lightly streaked  Duration of HD Treatment -hour(s) 3 hour(s)  Hemodialysis Intake (mL) 500 mL  UF Total -Machine (mL) 3511 mL  Net UF (mL) 3011 mL  Tolerated HD Treatment Yes  Education / Care Plan  Dialysis Education Provided Yes  Documented Education in Care Plan Yes  Hemodialysis Catheter Left Internal jugular Triple-lumen  Placement Date/Time: 05/31/17 1200   Placed prior to admission: No  Time Out: Correct patient;Correct site;Correct procedure  Maximum sterile barrier precautions: Hand hygiene;Sterile gloves;Cap;Large sterile sheet;Mask;Sterile gown  Site Prep: Chlorh...  Site Condition No complications  Blue Lumen Status Heparin locked  Red Lumen Status Heparin locked  Purple Lumen Status N/A  Catheter fill solution Heparin 1000 units/ml  Catheter fill volume (Arterial) 1.4 cc  Catheter fill volume (Venous) 1.4  Dressing Type Biopatch  Dressing Status Clean;Dry;Intact  Drainage Description None  Post treatment catheter status Capped and Clamped

## 2017-06-07 NOTE — Progress Notes (Signed)
HD tx end   06/07/17 1624  Vital Signs  Pulse Rate 63  Pulse Rate Source Monitor  Resp 20  BP (!) 118/53  BP Location Right Arm  BP Method Automatic  Patient Position (if appropriate) Lying  Oxygen Therapy  SpO2 99 %  During Hemodialysis Assessment  Dialysis Fluid Bolus Normal Saline  Bolus Amount (mL) 250 mL  Intra-Hemodialysis Comments Tx completed

## 2017-06-07 NOTE — Progress Notes (Signed)
[name] at [place] NAME: [name]    MR#:  [id number]  DATE OF BIRTH:  September 23, 1933  SUBJECTIVE:   He was short of breath last night.  Chest x-ray shows pulmonary edema.  He will receive dialysis today.  REVIEW OF SYSTEMS:    Review of Systems  Constitutional: Negative for fever, chills weight loss HENT: Negative for ear pain, nosebleeds, congestion, facial swelling, rhinorrhea, neck pain, neck stiffness and ear discharge.   Respiratory: Resident of for cough, shortness of breath, no wheezing  Cardiovascular: Negative for chest pain, palpitations and leg swelling.  Gastrointestinal: Negative for heartburn, abdominal pain, vomiting, diarrhea or consitpation Genitourinary: Negative for dysuria, urgency, frequency, hematuria Musculoskeletal: Negative for back pain or joint pain Neurological: Negative for dizziness, seizures, syncope, focal weakness,  numbness and headaches.  Hematological: Does not bruise/bleed easily.  Psychiatric/Behavioral: Negative for hallucinations, confusion, dysphoric mood    Tolerating Diet: yes      DRUG ALLERGIES:  No Known Allergies  VITALS:  Blood pressure (!) 130/43, pulse 72, temperature 97.9 F (36.6 C), temperature source Axillary, resp. rate (!) 23, height 5\' 8"  (1.727 m), weight 114.6 kg (252 lb 10.4 oz), SpO2 95 %.  PHYSICAL EXAMINATION:  Constitutional: Appears well-developed and well-nourished. No distress. Wearing high flow nasal cannula HENT: Normocephalic. Marland Kitchen Oropharynx is clear and moist.  Eyes: Conjunctivae and EOM are normal. PERRLA, no scleral icterus.  Neck: Normal ROM. Neck supple. No JVD. No tracheal deviation. CVS: RRR, S1/S2 +, no murmurs, no gallops, no carotid bruit.  Pulmonary: Lateral rhonchi and crackles Abdominal: Soft. BS +,  no distension, tenderness, rebound or guarding.  Musculoskeletal: Normal range of motion. No edema and no tenderness.  Neuro: Alert. CN 2-12 grossly  intact. No focal deficits. Skin: Skin is warm and dry. No rash noted. Psychiatric: Normal mood and affect.      LABORATORY PANEL:   CBC Recent Labs  Lab 06/05/17 1451  WBC 3.7*  HGB 8.7*  HCT 26.2*  PLT 73*   ------------------------------------------------------------------------------------------------------------------  Chemistries  Recent Labs  Lab 06/04/17 2140  06/07/17 0347  NA 135   < > 138  K 4.6   < > 3.7  CL 106   < > 106  CO2 26   < > 29  GLUCOSE 90   < > 78  BUN 15   < > 7  CREATININE 1.17   < > 1.03  CALCIUM 7.9*   < > 7.2*  MG 1.7   < > 1.7  AST 46*  --   --   ALT 14*  --   --   ALKPHOS 113  --   --   BILITOT 1.6*  --   --    < > = values in this interval not displayed.   ------------------------------------------------------------------------------------------------------------------  Cardiac Enzymes Recent Labs  Lab 06/04/17 2140  TROPONINI 0.03*   ------------------------------------------------------------------------------------------------------------------  RADIOLOGY:  Dg Chest Port 1 View  Result Date: 06/07/2017 CLINICAL DATA:  Pneumonia EXAM: PORTABLE CHEST 1 VIEW COMPARISON:  Two days ago FINDINGS: Central line with tip at the SVC origin. Diffuse interstitial coarsening with layering pleural effusions greater on the right. Stable borderline cardiomegaly. No pneumothorax. Artifact from EKG leads. IMPRESSION: Pulmonary edema and layering pleural effusions. No change from 2 days prior. Electronically Signed   By: Monte Fantasia M.D.   On: 06/07/2017 07:12     ASSESSMENT AND PLAN:   84 year old male with history of PAF, chronic diastolic  heart failure, hepatic cirrhosis and diabetes who presented to the emergency room due to GI bleed and acute kidney injury.   1.  Acute hypoxic respiratory failure due to pneumonia and fluid overload due to acute kidney injury Wean high flow as tolerated  2.  Septic shock due to pneumonia: Patient  is now off of dopamine Cultures have been negative Continue IV vancomycin and Unasyn  3.  Acute on chronic kidney disease stage III: This is due to ATN Dialysis as per nephrology  4.  GI bleed with anemia of chronic disease status post EGD showing gastritis/varices Continue PPI  5.  COPD without signs of exacerbation  6  Diabetes: Continue sliding scale  7.  Acute on chronic diastolic heart failure: Continue dialysis  Gust with nephrology Management plans discussed with the patient and only and they are in agreement.  CODE STATUS: Full  TOTAL TIME TAKING CARE OF THIS PATIENT: 30 minutes.     POSSIBLE D/C 3-6 days, DEPENDING ON CLINICAL CONDITION.   Fatimah Sundquist M.D on 06/07/2017 at 11:26 AM  Between 7am to 6pm - Pager - [phone] After 6pm go to [email or web address] - password [email or web address] [facility]  Office  [phone]  CC: Primary care physician; Michael Bush, MD  Note: This dictation was prepared with Dragon dictation along with smaller phrase technology. Any transcriptional errors that result from this process are unintentional.

## 2017-06-07 NOTE — Progress Notes (Signed)
   06/07/17 1130  Clinical Encounter Type  Visited With Patient and family together  Visit Type Follow-up  Spiritual Encounters  Spiritual Needs Prayer   Chaplain attempted follow up visit; both patient and daughter were asleep.  Chaplain offered silent prayers for patient, family, and care team.

## 2017-06-07 NOTE — Progress Notes (Signed)
Post HD assessment    06/07/17 1633  Neurological  Level of Consciousness Alert  Orientation Level Oriented X4  Respiratory  Respiratory Pattern Regular;Unlabored  Chest Assessment Chest expansion symmetrical  Cough Non-productive  Cardiac  ECG Monitor Yes  Vascular  R Radial Pulse +2  L Radial Pulse +2  Edema Generalized;Right upper extremity;Left upper extremity;Right lower extremity;Left lower extremity  Integumentary  Integumentary (WDL) X  Skin Color Appropriate for ethnicity  Musculoskeletal  Musculoskeletal (WDL) X  Generalized Weakness Yes  GU Assessment  Genitourinary (WDL) X  Genitourinary Symptoms  (HD)  Psychosocial  Psychosocial (WDL) X  Patient Behaviors Cooperative;Calm  Emotional support given Given to patient;Given to patient's family

## 2017-06-07 NOTE — Progress Notes (Signed)
Resting at this time.  A&Ox4 during shift.  Tolerated dialysis treatment.  Report given to [facility], RN who is now taking over patient's care.

## 2017-06-07 NOTE — Progress Notes (Signed)
Pharmacy Electrolyte Monitoring Consult:  Pharmacy consulted to assist in monitoring and replacing electrolytes in this 84 y.o. male admitted on 05/30/2017 with Weakness   Labs:  Sodium (mmol/L)  Date Value  06/07/2017 138  06/15/2011 141   Potassium (mmol/L)  Date Value  06/07/2017 3.7  06/15/2011 3.7   Magnesium (mg/dL)  Date Value  06/07/2017 1.7  06/14/2011 1.7 (L)   Phosphorus (mg/dL)  Date Value  06/07/2017 2.0 (L)  06/07/2017 2.0 (L)   Calcium (mg/dL)  Date Value  06/07/2017 7.2 (L)   Calcium, Total (mg/dL)  Date Value  06/15/2011 8.3 (L)   Albumin (g/dL)  Date Value  06/07/2017 2.0 (L)  06/11/2011 4.1    Assessment/Plan: 5/13 AM Phos= 2.0, Mg: 1.7. Hemodialysis patient.  Patient on MagOx tablet 400mg  daily.  Will order KPhos 57mmol IV x 1 dose and Magnesium 2g IV x 1 dose.  Recheck electrolytes with AM labs and continue to replace as needed.   Pernell Dupre, PharmD, BCPS Clinical Pharmacist 06/07/2017 7:20 AM

## 2017-06-08 ENCOUNTER — Other Ambulatory Visit: Payer: Self-pay | Admitting: Family Medicine

## 2017-06-08 ENCOUNTER — Inpatient Hospital Stay: Payer: PPO

## 2017-06-08 LAB — CBC
HCT: 25.3 % — ABNORMAL LOW (ref 40.0–52.0)
Hemoglobin: 8.6 g/dL — ABNORMAL LOW (ref 13.0–18.0)
MCH: 31.7 pg (ref 26.0–34.0)
MCHC: 34 g/dL (ref 32.0–36.0)
MCV: 93.1 fL (ref 80.0–100.0)
PLATELETS: 84 10*3/uL — AB (ref 150–440)
RBC: 2.71 MIL/uL — AB (ref 4.40–5.90)
RDW: 16.6 % — ABNORMAL HIGH (ref 11.5–14.5)
WBC: 3.3 10*3/uL — ABNORMAL LOW (ref 3.8–10.6)

## 2017-06-08 LAB — BASIC METABOLIC PANEL
Anion gap: 3 — ABNORMAL LOW (ref 5–15)
BUN: 10 mg/dL (ref 6–20)
CHLORIDE: 106 mmol/L (ref 101–111)
CO2: 29 mmol/L (ref 22–32)
CREATININE: 1.5 mg/dL — AB (ref 0.61–1.24)
Calcium: 7.4 mg/dL — ABNORMAL LOW (ref 8.9–10.3)
GFR calc Af Amer: 49 mL/min — ABNORMAL LOW (ref >60)
GFR calc non Af Amer: 42 mL/min — ABNORMAL LOW (ref >60)
Glucose, Bld: 92 mg/dL (ref 65–99)
Potassium: 3.9 mmol/L (ref 3.5–5.1)
Sodium: 138 mmol/L (ref 135–145)

## 2017-06-08 LAB — GLUCOSE, CAPILLARY
Glucose-Capillary: 86 mg/dL (ref 65–99)
Glucose-Capillary: 104 mg/dL — ABNORMAL HIGH (ref 65–99)
Glucose-Capillary: 115 mg/dL — ABNORMAL HIGH (ref 65–99)
Glucose-Capillary: 93 mg/dL (ref 65–99)

## 2017-06-08 LAB — PROCALCITONIN: Procalcitonin: 0.17 ng/mL

## 2017-06-08 LAB — BRAIN NATRIURETIC PEPTIDE: B Natriuretic Peptide: 668 pg/mL — ABNORMAL HIGH (ref 0.0–100.0)

## 2017-06-08 MED ORDER — SODIUM CHLORIDE 0.9 % IV SOLN
0.0000 ug/min | INTRAVENOUS | Status: DC
  Filled 2017-06-08: qty 4

## 2017-06-08 MED ORDER — SODIUM CHLORIDE 0.9 % IV SOLN: 0.0000 ug/min | INTRAVENOUS | Status: DC

## 2017-06-08 MED ORDER — SALINE SPRAY 0.65 % NA SOLN
1.0000 | NASAL | Status: DC | PRN
  Administered 2017-06-08: 1 via NASAL
  Filled 2017-06-08: qty 44

## 2017-06-08 MED ORDER — FUROSEMIDE 10 MG/ML IJ SOLN
80.0000 mg | Freq: Once | INTRAMUSCULAR | Status: AC
  Administered 2017-06-08: 80 mg via INTRAVENOUS
  Filled 2017-06-08: qty 8

## 2017-06-08 NOTE — Progress Notes (Signed)
Report called to [facility], RN on 2A.  Patient has been A&Ox4.  No respiratory distress.  Tolerating dysphagia 3 diet and thin liquids well.

## 2017-06-08 NOTE — Evaluation (Signed)
Physical Therapy Evaluation Patient Details Name: [name] MRN: [id number] DOB: 09-12-1933 Today's Date: 06/08/2017   History of Present Illness  Pt admitted for AKI with complaints of weakness. According to pt, after vein surgery, pt drastically became weak and slid out of bed at home. Hospital stay complicated by CRRT and now temp IJ placed for HD. Recent admission for GI bleed and ARF. History includes CHF, Afib, COPD, DM, bladder ca, HTN.   Clinical Impression  Pt is a pleasant 84 year old male admitted for AKI with weakness. Pt performs bed mobility with mod assist and able to sit at EOB briefly. Educated on neutral neck position due to temp HD cath. Limited due to cardiopulm status with O2 decreasing to 79% while sitting with increased SOB symptoms. Assisted to return back supine with O2 sats improving to 95% on 4.5L of O2. Pt demonstrates deficits with strength/mobility/endurance. Pt is currently not at baseline level at this time. Would benefit from skilled PT to address above deficits and promote optimal return to PLOF; recommend transition to STR upon discharge from acute hospitalization.       Follow Up Recommendations SNF(however refusing at this time per chart)    Equipment Recommendations  None recommended by PT    Recommendations for Other Services       Precautions / Restrictions Precautions Precautions: Fall Restrictions Weight Bearing Restrictions: No      Mobility  Bed Mobility Overal bed mobility: Needs Assistance Bed Mobility: Supine to Sit     Supine to sit: Mod assist     General bed mobility comments: needs assist for sliding B LEs off bed. Once seated at EOB, very diaphoretic O2 sats decreasing to 79% while on 4.5 L of O2. Returned to bed with +2 assist for positioning. O2 sats improved to 95% after 5 minutes of pursed lip breathing.  Transfers                 General transfer comment: not performed secondary to medical  issues  Ambulation/Gait                Stairs            Wheelchair Mobility    Modified Rankin (Stroke Patients Only)       Balance Overall balance assessment: Needs assistance Sitting-balance support: Feet supported Sitting balance-Leahy Scale: Fair                                       Pertinent Vitals/Pain Pain Assessment: No/denies pain    Home Living Family/patient expects to be discharged to:: Private residence Living Arrangements: Spouse/significant other;Children Available Help at Discharge: Family Type of Home: House Home Access: Ramped entrance     Home Layout: One level Home Equipment: Environmental consultant - 2 wheels;Walker - 4 wheels;Wheelchair - manual      Prior Function Level of Independence: Independent with assistive device(s)         Comments: able to stand and ambulate short distances with rollater prior to admission, has not got OOB since admission to hospital     Hand Dominance        Extremity/Trunk Assessment   Upper Extremity Assessment Upper Extremity Assessment: Generalized weakness(B UE grossly 4/5)    Lower Extremity Assessment Lower Extremity Assessment: Generalized weakness(L LE grossly 3/5 R LE grossly 3+/5)       Communication   Communication: No difficulties  Cognition Arousal/Alertness: Awake/alert Behavior During Therapy: WFL for tasks assessed/performed Overall Cognitive Status: Within Functional Limits for tasks assessed                                        General Comments      Exercises Other Exercises Other Exercises: supine ther-ex performed on B LE including SAQ, ankle pumps, SLRs, hip abd/add, and quad sets. All ther-ex performed x 10 reps with safe technique and min assist   Assessment/Plan    PT Assessment Patient needs continued PT services  PT Problem List Decreased strength;Decreased balance;Decreased mobility;Cardiopulmonary status limiting activity        PT Treatment Interventions Gait training;DME instruction;Therapeutic exercise;Balance training    PT Goals (Current goals can be found in the Care Plan section)  Acute Rehab PT Goals Patient Stated Goal: to go home PT Goal Formulation: With patient Time For Goal Achievement: 06/22/17 Potential to Achieve Goals: Good    Frequency Min 2X/week   Barriers to discharge        Co-evaluation               AM-PAC PT "6 Clicks" Daily Activity  Outcome Measure Difficulty turning over in bed (including adjusting bedclothes, sheets and blankets)?: Unable Difficulty moving from lying on back to sitting on the side of the bed? : Unable Difficulty sitting down on and standing up from a chair with arms (e.g., wheelchair, bedside commode, etc,.)?: Unable Help needed moving to and from a bed to chair (including a wheelchair)?: Total Help needed walking in hospital room?: Total Help needed climbing 3-5 steps with a railing? : Total 6 Click Score: 6    End of Session Equipment Utilized During Treatment: Oxygen Activity Tolerance: Treatment limited secondary to medical complications (Comment) Patient left: in bed;with bed alarm set Nurse Communication: Mobility status PT Visit Diagnosis: Unsteadiness on feet (R26.81);Muscle weakness (generalized) (M62.81);Difficulty in walking, not elsewhere classified (R26.2)    Time: 6387-5643 PT Time Calculation (min) (ACUTE ONLY): 23 min   Charges:   PT Evaluation $PT Eval Moderate Complexity: 1 Mod PT Treatments $Therapeutic Exercise: 8-22 mins   PT G CodesGreggory Stallion, PT, DPT [phone]   Noriah Osgood 06/08/2017, 5:30 PM

## 2017-06-08 NOTE — Progress Notes (Signed)
[place] at [place] NAME: [name]    MR#:  [id number]  DATE OF BIRTH:  Apr 06, 1933  SUBJECTIVE:   He was weaned off high flow nasal cannula to regular nasal cannula.  Still short of breath  REVIEW OF SYSTEMS:    Review of Systems  Constitutional: Negative for fever, chills weight loss HENT: Negative for ear pain, nosebleeds, congestion, facial swelling, rhinorrhea, neck pain, neck stiffness and ear discharge.   Respiratory: ++ for cough, shortness of breath, no wheezing  Cardiovascular: Negative for chest pain, palpitations and leg swelling.  Gastrointestinal: Negative for heartburn, abdominal pain, vomiting, diarrhea or consitpation Genitourinary: Negative for dysuria, urgency, frequency, hematuria Musculoskeletal: Negative for back pain or joint pain Neurological: Negative for dizziness, seizures, syncope, focal weakness,  numbness and headaches.  Hematological: Does not bruise/bleed easily.  Psychiatric/Behavioral: Negative for hallucinations, confusion, dysphoric mood    Tolerating Diet: yes      DRUG ALLERGIES:  No Known Allergies  VITALS:  Blood pressure (!) 132/51, pulse 69, temperature 98 F (36.7 C), temperature source Axillary, resp. rate (!) 21, height 5\' 8"  (1.727 m), weight 113.4 kg (250 lb), SpO2 94 %.  PHYSICAL EXAMINATION:  Constitutional: Appears well-developed and well-nourished. No distress. Wearing high flow nasal cannula HENT: Normocephalic. Marland Kitchen Oropharynx is clear and moist.  Eyes: Conjunctivae and EOM are normal. PERRLA, no scleral icterus.  Neck: Normal ROM. Neck supple. No JVD. No tracheal deviation. CVS: RRR, S1/S2 +, no murmurs, no gallops, no carotid bruit.  Pulmonary: b/l rhonchi and crackles Abdominal: Soft. BS +,  no distension, tenderness, rebound or guarding.  Musculoskeletal: Normal range of motion. 4+ edema and no tenderness.  Neuro: Alert. CN 2-12 grossly intact. No focal deficits. Skin:  Skin is warm and dry. No rash noted. Psychiatric: Normal mood and affect.      LABORATORY PANEL:   CBC Recent Labs  Lab 06/08/17 0422  WBC 3.3*  HGB 8.6*  HCT 25.3*  PLT 84*   ------------------------------------------------------------------------------------------------------------------  Chemistries  Recent Labs  Lab 06/04/17 2140  06/07/17 0347 06/08/17 0422  NA 135   < > 138 138  K 4.6   < > 3.7 3.9  CL 106   < > 106 106  CO2 26   < > 29 29  GLUCOSE 90   < > 78 92  BUN 15   < > 7 10  CREATININE 1.17   < > 1.03 1.50*  CALCIUM 7.9*   < > 7.2* 7.4*  MG 1.7   < > 1.7  --   AST 46*  --   --   --   ALT 14*  --   --   --   ALKPHOS 113  --   --   --   BILITOT 1.6*  --   --   --    < > = values in this interval not displayed.   ------------------------------------------------------------------------------------------------------------------  Cardiac Enzymes Recent Labs  Lab 06/04/17 2140  TROPONINI 0.03*   ------------------------------------------------------------------------------------------------------------------  RADIOLOGY:  Dg Chest Port 1 View  Result Date: 06/08/2017 CLINICAL DATA:  Respiratory failure. EXAM: PORTABLE CHEST 1 VIEW COMPARISON:  Radiograph Jun 07, 2017. FINDINGS: Stable cardiomegaly with central pulmonary vascular congestion. No pneumothorax is noted. Left internal jugular dialysis catheter is unchanged in position. Stable bibasilar densities are noted concerning for edema with mild right pleural effusion. Bony thorax is unremarkable. Minimal left pleural effusion is noted. IMPRESSION: Stable bilateral pulmonary edema with pleural  effusions. Electronically Signed   By: Marijo Conception, M.D.   On: 06/08/2017 07:10   Dg Chest Port 1 View  Result Date: 06/07/2017 CLINICAL DATA:  Pneumonia EXAM: PORTABLE CHEST 1 VIEW COMPARISON:  Two days ago FINDINGS: Central line with tip at the SVC origin. Diffuse interstitial coarsening with layering  pleural effusions greater on the right. Stable borderline cardiomegaly. No pneumothorax. Artifact from EKG leads. IMPRESSION: Pulmonary edema and layering pleural effusions. No change from 2 days prior. Electronically Signed   By: Monte Fantasia M.D.   On: 06/07/2017 07:12     ASSESSMENT AND PLAN:   84 year old male with history of PAF, chronic diastolic heart failure, hepatic cirrhosis and diabetes who presented to the emergency room due to GI bleed and acute kidney injury.   1.  Acute hypoxic respiratory failure due to pneumonia and fluid overload due to acute kidney injury Patient wean from high flow to nasal cannula  2.  Septic shock due to pneumonia: Patient is now off of dopamine Cultures have been negative Has been treated with Unasyn and antibiotics have been discontinued.   3.  Acute on chronic kidney disease stage III: This is due to ATN Continue dialysis as per recommended by nephrology.  He was on CRRT at one point.  4.  GI bleed with anemia of chronic disease status post EGD showing gastritis/varices Continue PPI  5.  COPD without signs of exacerbation  6  Diabetes: Continue sliding scale  7.  Acute on chronic diastolic heart failure: Continue dialysis for pleural effusions and fluid overload   Management plans discussed with the patient and only and they are in agreement.  CODE STATUS: Full  TOTAL TIME TAKING CARE OF THIS PATIENT: 30 minutes.     POSSIBLE D/C 3-6 days, DEPENDING ON CLINICAL CONDITION.   Brigitt Mcclish M.D on 06/08/2017 at 8:43 AM  Between 7am to 6pm - Pager - [phone] After 6pm go to [email or web address] - password [email or web address] [facility]  Office  [phone]  CC: Primary care physician; Ria Bush, MD  Note: This dictation was prepared with Dragon dictation along with smaller phrase technology. Any transcriptional errors that result from this process are unintentional.

## 2017-06-08 NOTE — Progress Notes (Signed)
Pharmacy Electrolyte Monitoring Consult:  Pharmacy consulted to assist in monitoring and replacing electrolytes in this 84 y.o. male admitted on 05/30/2017 with Weakness  Labs:  Sodium (mmol/L)  Date Value  06/08/2017 138  06/15/2011 141   Potassium (mmol/L)  Date Value  06/08/2017 3.9  06/15/2011 3.7   Magnesium (mg/dL)  Date Value  06/07/2017 1.7  06/14/2011 1.7 (L)   Phosphorus (mg/dL)  Date Value  06/07/2017 2.0 (L)  06/07/2017 2.0 (L)   Calcium (mg/dL)  Date Value  06/08/2017 7.4 (L)   Calcium, Total (mg/dL)  Date Value  06/15/2011 8.3 (L)   Albumin (g/dL)  Date Value  06/07/2017 2.0 (L)  06/11/2011 4.1    Assessment/Plan: 5/15 Electrolytes WNL. No supplementation needed. Will recheck electrolytes with AM labs and continue to replace as needed.   Pernell Dupre, PharmD, BCPS Clinical Pharmacist 06/08/2017 11:06 AM

## 2017-06-08 NOTE — Progress Notes (Signed)
Speech Language Pathology Treatment: Dysphagia  Patient Details Name: [name] MRN: [id number] DOB: 1933-05-01 Today's Date: 06/08/2017 Time: 0263-7858 SLP Time Calculation (min) (ACUTE ONLY): 45 min  Assessment / Plan / Recommendation Clinical Impression  Pt seen for ongoing assessment of swallowing and trials to upgrade his diet consistency as able. Pt has been tolerating the Nectar consistency liquids and the trials of MINCED foods per Dtr and NSG report - especially liked the pot roast dinner meal last night.  Pt has transitioned to Arnold O2 support, 3 liters. He required upright positioning w/ pillows in the bed. Pt then consumed trials of Thin liquids slowly via CUP and mech soft foods w/ no overt s/s of aspiration immediately during/post trials - pt continues to present w/ a half/min cough during any exertion but this did not appear related to the po trials and it only occurred 1x post po's presented. No decline in O2 sats(94%), or RR/HR. Oral phase appeared grossly wfl for bolus management and A-P transfer; full oral clearing attainted w/ each bolus. Pt does take his time w/ increased mastication time/effort noted - Dtr reported similar at home. Pt stated he did not care for the solid food trials. Pt required full feeding assistance w/ eating po's d/t overall weakness but HELD CUP FOR DRINKING.  Recommend upgrade of diet w/ introduction of Dysphagia level 3 w/ Thin liquids - strict aspiration precautions; Pills in Puree - Whole. Pt requires feeding assistance at meals but should HOLD CUP TO DRINK. ST services will f/u w/ toleration of diet next 1-2 days; education. Education on the above and on aspiration precautions given to pt and Dtr; NSG. Discussed recommendations and POC; precautions posted. NSG updated and agreed.    HPI HPI: Pt is an 84 y/o male w/ past medical history of multiple medical issues including Gastric bleeding, cdiff, gastric varices(EGD on 06/01/17), pyloric stenosis, CHF,  DM2, atrial fibrillation, COPD, hemorrhoids, hypertension and diastolic heart failure presents the emergency department complaining of weakness.  The patient's daughter states that his dramatic decline in strength started the day after his varicose vein laser ablation.  Today the patient slid out of bed.  He did not fall or hit his head.  He did not lose consciousness but felt lightheaded and weak.  The patient's primary doctor had given him a fecal occult blood test kit a few weeks ago as he had noticed the patient's blood count gradually dropping over time.  The patient has not seen any frank blood in his stool but admits to dark-colored bowel movements.  He denies abdominal pain or hematemesis.  The patient also denies chest pain but admits to chronic left shoulder pain.  Laboratory evaluation in the emergency department revealed elevated potassium as well as significantly worsened kidney function.  The patient was also hypotensive and bradycardic which prompted the emergency department staff to call the hospitalist service for admission. During this admission, and post GI assessment, pt has been placed on a Clear Liquid diet per GI. Noted CXR 06/02/17 w/ concern for Increasing right upper lobe atelectasis and right pleural effusion - of note, pt has had RLL in 2016(could be related to his GI issues?).      SLP Plan  Continue with current plan of care       Recommendations  Diet recommendations: Dysphagia 3 (mechanical soft);Thin liquid Liquids provided via: Cup;No straw(w/ thin liquids) Medication Administration: Whole meds with puree(for safer swallowing) Supervision: Patient able to self feed;Staff to assist with self feeding;Full  supervision/cueing for compensatory strategies Compensations: Minimize environmental distractions;Slow rate;Small sips/bites;Lingual sweep for clearance of pocketing;Multiple dry swallows after each bite/sip;Follow solids with liquid Postural Changes and/or Swallow  Maneuvers: Seated upright 90 degrees;Upright 30-60 min after meal                General recommendations: (Dietician f/u) Oral Care Recommendations: Oral care BID;Staff/trained caregiver to provide oral care Follow up Recommendations: Skilled Nursing facility SLP Visit Diagnosis: Dysphagia, oropharyngeal phase (R13.12) Plan: Continue with current plan of care       Oxford, Leroy, CCC-SLP Yona Kosek 06/08/2017, 9:58 AM

## 2017-06-08 NOTE — Progress Notes (Signed)
Patient transferred to room 245 by bed with Gerald Stabs, NT and daughter at side.  Alert with no distress noted when leaving ICU on tele and o2.  130 cc out from urostomy after lasix was given this morning.

## 2017-06-08 NOTE — Progress Notes (Signed)
[facility]  ROUNDING NOTE   Subjective:   UOP 250  Hemodialysis treatment yesterday. Sequential treatment ultrafiltration only - 3 liters removed.   HFNC  Daughter at bedside.   Objective:  Vital signs in last 24 hours:  Temp:  [97.7 F (36.5 C)-98.6 F (37 C)] 98 F (36.7 C) (05/15 0730) Pulse Rate:  [54-81] 76 (05/15 0830) Resp:  [15-35] 29 (05/15 0830) BP: (112-146)/(39-73) 146/50 (05/15 0830) SpO2:  [93 %-99 %] 96 % (05/15 0830) FiO2 (%):  [35 %] 35 % (05/14 1130) Weight:  [113.4 kg (250 lb)-119.4 kg (263 lb 3.7 oz)] 113.4 kg (250 lb) (05/15 0426)  Weight change: -3.1 kg (-6 lb 13.4 oz) Filed Weights   06/07/17 1245 06/07/17 1634 06/08/17 0426  Weight: 119.4 kg (263 lb 3.7 oz) 115.5 kg (254 lb 10.1 oz) 113.4 kg (250 lb)    Intake/Output: I/O last 3 completed shifts: In: 6834 [P.O.:480; IV Piggyback:856] Out: 1962 [Urine:375; Other:3512]   Intake/Output this shift:  No intake/output data recorded.  Physical Exam: General: Critically Ill  Head: Normocephalic, atraumatic.   Eyes: Anicteric  Neck: Supple, trachea midline  Lungs:  Scattered rhonchi and rales, HFNC 40%  Heart: regular  Abdomen:  Soft, nontender, bowel sounds present, urostomy in place  Extremities: 2-3+ peripheral edema in all four extremities  Neurologic: Alert and oriented  Skin: No lesions  Access: Left IJ temporary dialysis catheter    Basic Metabolic Panel: Recent Labs  Lab 06/05/17 1451 06/05/17 1749 06/05/17 2218 06/06/17 0406 06/06/17 1727 06/07/17 0347 06/08/17 0422  NA 135 136 136 137  --  138 138  K 4.3 4.4 4.3 4.2  --  3.7 3.9  CL 106 105 106 105  --  106 106  CO2 25 27 27 28   --  29 29  GLUCOSE 87 85 78 74  --  78 92  BUN 11 11 11 9   --  7 10  CREATININE 1.05 1.06 0.96 1.09  --  1.03 1.50*  CALCIUM 7.7* 8.0* 7.8* 7.9*  --  7.2* 7.4*  MG 1.7 1.8 1.7 1.6*  --  1.7  --   PHOS 2.6 2.3* 2.2* 2.3* 2.1* 2.0*  2.0*  --     Liver Function Tests: Recent  Labs  Lab 06/04/17 2140  06/05/17 1451 06/05/17 1749 06/05/17 2218 06/06/17 0406 06/07/17 0347  AST 46*  --   --   --   --   --   --   ALT 14*  --   --   --   --   --   --   ALKPHOS 113  --   --   --   --   --   --   BILITOT 1.6*  --   --   --   --   --   --   PROT 5.1*  --   --   --   --   --   --   ALBUMIN 2.1*  2.2*   < > 2.1* 2.1* 2.1* 2.1* 2.0*   < > = values in this interval not displayed.   No results for input(s): LIPASE, AMYLASE in the last 168 hours. Recent Labs  Lab 06/04/17 2140  AMMONIA 50*    CBC: Recent Labs  Lab 06/02/17 0323 06/03/17 0433 06/05/17 1451 06/08/17 0422  WBC 6.9 5.8 3.7* 3.3*  NEUTROABS 4.9 3.6 2.5  --   HGB 9.0* 9.8* 8.7* 8.6*  HCT 27.2* 28.8* 26.2* 25.3*  MCV 96.3 95.7 93.7 93.1  PLT 149* 160 73* 84*    Cardiac Enzymes: Recent Labs  Lab 06/04/17 2140  TROPONINI 0.03*    BNP: Invalid input(s): POCBNP  CBG: Recent Labs  Lab 06/07/17 0730 06/07/17 1137 06/07/17 1631 06/07/17 2139 06/08/17 0722  GLUCAP 70 90 102* 107* 108    Microbiology: Results for orders placed or performed during the hospital encounter of 05/30/17  Culture, blood (routine x 2)     Status: None   Collection Time: 05/31/17 12:05 AM  Result Value Ref Range Status   Specimen Description BLOOD RIGHT HAND  Final   Special Requests   Final    BOTTLES DRAWN AEROBIC AND ANAEROBIC Blood Culture adequate volume   Culture   Final    NO GROWTH 5 DAYS Performed at [facility], [address], [place], [place] [address]    Report Status 06/05/2017 FINAL  Final  Urine culture     Status: Abnormal   Collection Time: 05/31/17 12:05 AM  Result Value Ref Range Status   Specimen Description   Final    URINE, RANDOM Performed at [facility], [address], [place], [place] [address]    Special Requests   Final    NONE Performed at [facility], [address], [place], [place] [address]    Culture MULTIPLE SPECIES PRESENT,  SUGGEST RECOLLECTION (A)  Final   Report Status 06/01/2017 FINAL  Final  Culture, blood (routine x 2)     Status: None   Collection Time: 05/31/17 12:08 AM  Result Value Ref Range Status   Specimen Description BLOOD LEFT AC  Final   Special Requests   Final    BOTTLES DRAWN AEROBIC AND ANAEROBIC Blood Culture adequate volume   Culture   Final    NO GROWTH 5 DAYS Performed at [facility], [address] [place], [place] [address]    Report Status 06/05/2017 FINAL  Final  MRSA PCR Screening     Status: None   Collection Time: 05/31/17  9:47 AM  Result Value Ref Range Status   MRSA by PCR NEGATIVE NEGATIVE Final    Comment:        The GeneXpert MRSA Assay (FDA approved for NASAL specimens only), is one component of a comprehensive MRSA colonization surveillance program. It is not intended to diagnose MRSA infection nor to guide or monitor treatment for MRSA infections. Performed at [facility], [address], [place], [place] [address]   Urine Culture     Status: Abnormal   Collection Time: 06/02/17  5:27 PM  Result Value Ref Range Status   Specimen Description   Final    URINE, RANDOM Performed at [facility], [address], [place], [place] [address]    Special Requests   Final    NONE Performed at [facility], [address] [place], [place] [address]    Culture (A)  Final    <10,000 COLONIES/mL INSIGNIFICANT GROWTH Performed at [facility] [address] [place], [place] [address]    Report Status 06/03/2017 FINAL  Final    Coagulation Studies: No results for input(s): LABPROT, INR in the last 72 hours.  Urinalysis: No results for input(s): COLORURINE, LABSPEC, PHURINE, GLUCOSEU, HGBUR, BILIRUBINUR, KETONESUR, PROTEINUR, UROBILINOGEN, NITRITE, LEUKOCYTESUR in the last 72 hours.  Invalid input(s): APPERANCEUR    Imaging: Dg Chest Port 1 View  Result Date: 06/08/2017 CLINICAL DATA:  Respiratory failure.  EXAM: PORTABLE CHEST  1 VIEW COMPARISON:  Radiograph Jun 07, 2017. FINDINGS: Stable cardiomegaly with central pulmonary vascular congestion. No pneumothorax is noted. Left internal jugular dialysis catheter is unchanged in position. Stable bibasilar densities are noted concerning for edema with mild right pleural effusion. Bony thorax is unremarkable. Minimal left pleural effusion is noted. IMPRESSION: Stable bilateral pulmonary edema with pleural effusions. Electronically Signed   By: Marijo Conception, M.D.   On: 06/08/2017 07:10   Dg Chest Port 1 View  Result Date: 06/07/2017 CLINICAL DATA:  Pneumonia EXAM: PORTABLE CHEST 1 VIEW COMPARISON:  Two days ago FINDINGS: Central line with tip at the SVC origin. Diffuse interstitial coarsening with layering pleural effusions greater on the right. Stable borderline cardiomegaly. No pneumothorax. Artifact from EKG leads. IMPRESSION: Pulmonary edema and layering pleural effusions. No change from 2 days prior. Electronically Signed   By: Monte Fantasia M.D.   On: 06/07/2017 07:12     Medications:   . phenylephrine (NEO-SYNEPHRINE) Adult infusion Stopped (06/08/17 0205)   . docusate sodium  100 mg Oral BID  . ferrous sulfate  325 mg Oral Q breakfast  . furosemide  80 mg Intravenous Once  . insulin aspart  0-5 Units Subcutaneous QHS  . insulin aspart  0-9 Units Subcutaneous TID WC  . magnesium oxide  400 mg Oral Daily  . nystatin   Topical TID  . pantoprazole  40 mg Oral BID  . simvastatin  20 mg Oral QHS  . sodium chloride flush  10-40 mL Intracatheter Q12H  . umeclidinium-vilanterol  1 puff Inhalation Daily  . vitamin B-12  250 mcg Oral Daily   acetaminophen **OR** [DISCONTINUED] acetaminophen, albuterol, morphine injection, [DISCONTINUED] ondansetron **OR** ondansetron (ZOFRAN) IV, sodium chloride flush  Assessment/ Plan:  Mr. [name] is a 84 y.o. white male with atrial fibrillation, congestive heart failure (no recent echo), COPD, hepatic  cirrhosis, diabetes mellitus type II, gastric varices, history of bladder cancer status post ileal conduit, hypertension, hyperlipidemia who was admitted to [facility] on 05/30/2017 for GI bleed and acute renal failure. Placed on CRRT from 5/8 to 5/9, then on 5/9 had intermitted hemodialysis treatment, restarted on CRRT  5/10 to 5/13. Intermittent hemodialysis on 5/13.   1.  Acute renal failure with hyperkalemia n chronic kidney disease stage III baseline creatinine of 1.8 GFR of 38 on 04/19/17.  Acute renal failure secondary to ATN. Oliguric urine output.  Chronic kidney disease secondary to obstructive uropathy, hypertension, and diabetes.  - Requiring renal replacement therapy since admission.  - Transitioned from CRRT to intermittent hemodialysis yesterday. Intermitted hemodialysis treatment yesterday. - IV furosemide 80mg  x 1  - No indication for dialysis today, most likely schedule for tomorrow. Monitor daily for dialysis needs.   2.  Anemia of chronic kidney disease with thrombocytopenia: hemoglobin 8.6. GI bleed on amdisssion. Endoscopy on 5/8 by Dr. Vicente Males finding gastric varices  - Iron supplements - pantoprazole  3. Hypertension with atrial fibrillation: heart rate and blood pressure at goal.   4. Acute respiratory failure: requiring HFNC. With pneumonia, right pleural effusion and pulmonary edema.  - appreciate Pulmonary input - Completed 7 days of Unasyn.    LOS: 8 Laymond Postle 5/15/20199:19 AM

## 2017-06-09 LAB — GLUCOSE, CAPILLARY
Glucose-Capillary: 70 mg/dL (ref 65–99)
Glucose-Capillary: 104 mg/dL — ABNORMAL HIGH (ref 65–99)
Glucose-Capillary: 116 mg/dL — ABNORMAL HIGH (ref 65–99)
Glucose-Capillary: 103 mg/dL — ABNORMAL HIGH (ref 65–99)

## 2017-06-09 LAB — MAGNESIUM: MAGNESIUM: 2.2 mg/dL (ref 1.7–2.4)

## 2017-06-09 LAB — PHOSPHORUS: Phosphorus: 3.1 mg/dL (ref 2.5–4.6)

## 2017-06-09 LAB — BASIC METABOLIC PANEL
ANION GAP: 5 (ref 5–15)
BUN: 16 mg/dL (ref 6–20)
CALCIUM: 7.9 mg/dL — AB (ref 8.9–10.3)
CO2: 29 mmol/L (ref 22–32)
Chloride: 105 mmol/L (ref 101–111)
Creatinine, Ser: 1.66 mg/dL — ABNORMAL HIGH (ref 0.61–1.24)
GFR, EST AFRICAN AMERICAN: 43 mL/min — AB (ref >60)
GFR, EST NON AFRICAN AMERICAN: 37 mL/min — AB (ref >60)
GLUCOSE: 88 mg/dL (ref 65–99)
POTASSIUM: 4 mmol/L (ref 3.5–5.1)
SODIUM: 139 mmol/L (ref 135–145)

## 2017-06-09 MED ORDER — ADULT MULTIVITAMIN W/MINERALS CH
1.0000 | ORAL_TABLET | Freq: Every day | ORAL | Status: DC
  Administered 2017-06-10 – 2017-06-16 (×7): 1 via ORAL
  Filled 2017-06-09 (×8): qty 1

## 2017-06-09 MED ORDER — ENSURE ENLIVE PO LIQD
237.0000 mL | Freq: Three times a day (TID) | ORAL | Status: DC
  Administered 2017-06-09 – 2017-06-15 (×11): 237 mL via ORAL

## 2017-06-09 NOTE — Progress Notes (Signed)
CBG 70 per Keda NT. Glucometer not synched yet. Patient about to eat breakfast.

## 2017-06-09 NOTE — Progress Notes (Signed)
Pharmacy Electrolyte Monitoring Consult:  Pharmacy consulted to assist in monitoring and replacing electrolytes in this 84 y.o. male admitted on 05/30/2017 with Weakness  Labs:  Sodium (mmol/L)  Date Value  06/09/2017 139  06/15/2011 141   Potassium (mmol/L)  Date Value  06/09/2017 4.0  06/15/2011 3.7   Magnesium (mg/dL)  Date Value  06/09/2017 2.2  06/14/2011 1.7 (L)   Phosphorus (mg/dL)  Date Value  06/09/2017 3.1   Calcium (mg/dL)  Date Value  06/09/2017 7.9 (L)   Calcium, Total (mg/dL)  Date Value  06/15/2011 8.3 (L)   Albumin (g/dL)  Date Value  06/07/2017 2.0 (L)  06/11/2011 4.1    Assessment/Plan: 5/16 Electrolytes WNL. No supplementation needed. Will recheck electrolytes with AM labs and continue to replace as needed.   Paulina Fusi, PharmD, BCPS 06/09/2017 11:55 AM

## 2017-06-09 NOTE — Progress Notes (Signed)
Pre HD assessment   06/09/17 1603  Vital Signs  Temp 99 F (37.2 C)  Temp Source Oral  Pulse Rate 71  Pulse Rate Source Monitor  Resp 20  BP (!) 145/48  BP Location Left Arm  BP Method Automatic  Patient Position (if appropriate) Lying  Oxygen Therapy  SpO2 100 %  O2 Device Nasal Cannula  O2 Flow Rate (L/min) 5 L/min  Pain Assessment  Pain Scale 0-10  Pain Score Asleep  Dialysis Weight  Weight 118.8 kg (261 lb 14.5 oz)  Type of Weight Pre-Dialysis  Time-Out for Hemodialysis  What Procedure? HD  Pt Identifiers(min of two) First/Last Name;MRN/Account#  Correct Site? Yes  Correct Side? Yes  Correct Procedure? Yes  Consents Verified? Yes  Rad Studies Available? N/A  Safety Precautions Reviewed? Yes  Engineer, civil (consulting) Number  ([id number])  Station Number 1  UF/Alarm Test Passed  Conductivity: Meter 14  Conductivity: Machine  14.3  pH 7.4  Reverse Osmosis main  Normal Saline Lot Number [id number]  Dialyzer Lot Number [id number]  Disposable Set Lot Number [id number]  Machine Temperature 98.6 F (37 C)  Musician and Audible Yes  Blood Lines Intact and Secured Yes  Pre Treatment Patient Checks  Vascular access used during treatment Catheter  Hepatitis B Surface Antigen Results Negative  Date Hepatitis B Surface Antigen Drawn 06/06/17  Hepatitis B Surface Antibody  (<10)  Date Hepatitis B Surface Antibody Drawn 06/06/17  Hemodialysis Consent Verified Yes  Hemodialysis Standing Orders Initiated Yes  ECG (Telemetry) Monitor On Yes  Prime Ordered Normal Saline  Length of  DialysisTreatment -hour(s) 3 Hour(s)  Dialyzer Elisio 17H NR  Dialysate 3K, 2.5 Ca  Dialysis Anticoagulant None  Dialysate Flow Ordered  (SEQ only )  Blood Flow Rate Ordered 400 mL/min  Ultrafiltration Goal 3 Liters  Dialysis Blood Pressure Support Ordered Normal Saline  Education / Care Plan  Dialysis Education Provided Yes  Documented Education in Care Plan Yes  Hemodialysis Catheter Left  Internal jugular Triple-lumen  Placement Date/Time: 05/31/17 1200   Placed prior to admission: No  Time Out: Correct patient;Correct site;Correct procedure  Maximum sterile barrier precautions: Hand hygiene;Sterile gloves;Cap;Large sterile sheet;Mask;Sterile gown  Site Prep: Chlorh...  Site Condition No complications  Blue Lumen Status Heparin locked  Red Lumen Status Heparin locked  Dressing Type Biopatch  Dressing Status Clean;Dry;Intact  Drainage Description None

## 2017-06-09 NOTE — Care Management Important Message (Signed)
Copy of signed IM left in patient's room.    

## 2017-06-09 NOTE — Progress Notes (Addendum)
Initial Nutrition Assessment  DOCUMENTATION CODES:   Obesity unspecified  INTERVENTION:   Bowel regimen per MD  Ensure Enlive po BID, each supplement provides 350 kcal and 20 grams of protein  Rena-vite daily   Magic cup TID with meals, each supplement provides 290 kcal and 9 grams of protein  MVI daily   Assist with meals   NUTRITION DIAGNOSIS:   Inadequate oral intake related to acute illness as evidenced by meal completion < 50%.  GOAL:   Patient will meet greater than or equal to 90% of their needs  MONITOR:   PO intake, Supplement acceptance, Labs, Weight trends, I & O's  REASON FOR ASSESSMENT:   LOS    ASSESSMENT:   84 y.o. white male with atrial fibrillation, congestive heart failure (no recent echo), COPD, hepatic cirrhosis, diabetes mellitus type II, cirrhosis, gastric and duodenal angiopathy, gastric varices, history of bladder cancer status post ileal conduit, hypertension, hyperlipidemia who was admitted to [facility] on 05/30/2017 for GI bleed and acute renal failure. Pt found to have PNA. Placed on CRRT from 5/8 to 5/9, then on 5/9 had intermitted hemodialysis treatment, restarted on CRRT  5/10 to 5/13. Intermittent hemodialysis on 5/13.   Met with pt in room today. Pt reports poor appetite and oral intake pta and currently. Pt reports that he is not a big eater at baseline but reports that recently, food does not taste good. Pt currently eating sips/bites of meals. Per chart, pt with ~20lb wt gain r/t edema; pt is about 40lbs above his reported UBW of ~110lbs. It is difficult to determine if any true weight loss or muscle/fat depletions r/t severe edema. RD discussed with pt the importance of adequate protein intake needed while pt receives HD. Pt is willing to try vanilla supplements and orange Magic Cups. Pt being followed by SLP recommended for dysphagia 3/thin liquid diet. RD will order supplements and vitamins. Pt with constipation; recommend bowel regimen per MD.  Pt needs assistance with meals as he has difficulty brining his utensils to his mouth. Pt wears dentures and needs these to eat.   Medications reviewed and include: colace, ferrous sulfate, insulin, Mg oxide, protonix, B12  Labs reviewed: K 4.0 wnl, P 3.1 wnl, Mg 2.2 wnl BNP 668(H) Wbc- 3.3(L), Hgb 8.6(L), Hct 25.3(L)  Nutrition-Focused physical exam completed. Findings are no fat depletion, no muscle depletion, and moderate to severe generalized edema. It is difficult to determine if any true muscle or fat depletions r/t edema.   Diet Order:   Diet Order           DIET DYS 3 Room service appropriate? Yes with Assist; Fluid consistency: Thin  Diet effective now         EDUCATION NEEDS:   Education needs have been addressed  Skin:  Skin Assessment: Reviewed RN Assessment  Last BM:  5/14- TYPE 2  Height:   Ht Readings from Last 1 Encounters:  06/01/17 5' 8"  (1.727 m)    Weight:   Wt Readings from Last 1 Encounters:  06/09/17 253 lb 6.4 oz (114.9 kg)    Ideal Body Weight:  70 kg  BMI:  Body mass index is 38.53 kg/m.  Estimated Nutritional Needs:   Kcal:  2000-2300kcal/day   Protein:  95-115g/day   Fluid:  per MD  Koleen Distance MS, RD, LDN Pager #- [phone] Office#- [phone] After Hours Pager: [phone]

## 2017-06-09 NOTE — Progress Notes (Signed)
[facility] [place] NAME: [name]    MR#:  [id number]  DATE OF BIRTH:  1933/12/31  SUBJECTIVE:   Family at bedside.  Patient is reporting shortness of breath.  Plan is for dialysis today.  REVIEW OF SYSTEMS:    Review of Systems  Constitutional: Negative for fever, chills weight loss HENT: Negative for ear pain, nosebleeds, congestion, facial swelling, rhinorrhea, neck pain, neck stiffness and ear discharge.   Respiratory: ++ for cough, shortness of breath, no wheezing  Cardiovascular: Negative for chest pain, palpitations and leg swelling.  Gastrointestinal: Negative for heartburn, abdominal pain, vomiting, diarrhea or consitpation Genitourinary: Negative for dysuria, urgency, frequency, hematuria Musculoskeletal: Negative for back pain or joint pain Neurological: Negative for dizziness, seizures, syncope, focal weakness,  numbness and headaches.  Hematological: Does not bruise/bleed easily.  Psychiatric/Behavioral: Negative for hallucinations, confusion, dysphoric mood    Tolerating Diet: yes      DRUG ALLERGIES:  No Known Allergies  VITALS:  Blood pressure (!) 138/50, pulse 78, temperature 97.8 F (36.6 C), temperature source Oral, resp. rate 18, height 5\' 8"  (1.727 m), weight 114.9 kg (253 lb 6.4 oz), SpO2 99 %.  PHYSICAL EXAMINATION:  Constitutional: Appears well-developed and well-nourished. No distress. Wearing high flow nasal cannula HENT: Normocephalic. Marland Kitchen Oropharynx is clear and moist.  Eyes: Conjunctivae and EOM are normal. PERRLA, no scleral icterus.  Neck: Normal ROM. Neck supple. No JVD. No tracheal deviation. CVS: RRR, S1/S2 +, no murmurs, no gallops, no carotid bruit.  Pulmonary: b/l rhonchi no wheezing Abdominal: Soft. BS +,  no distension, tenderness, rebound or guarding.  Musculoskeletal: Normal range of motion. 4+ edema and no tenderness.  Neuro: Alert. CN 2-12 grossly intact. No focal deficits. Skin:  Skin is warm and dry. No rash noted. Psychiatric: Normal mood and affect.      LABORATORY PANEL:   CBC Recent Labs  Lab 06/08/17 0422  WBC 3.3*  HGB 8.6*  HCT 25.3*  PLT 84*   ------------------------------------------------------------------------------------------------------------------  Chemistries  Recent Labs  Lab 06/04/17 2140  06/09/17 0525  NA 135   < > 139  K 4.6   < > 4.0  CL 106   < > 105  CO2 26   < > 29  GLUCOSE 90   < > 88  BUN 15   < > 16  CREATININE 1.17   < > 1.66*  CALCIUM 7.9*   < > 7.9*  MG 1.7   < > 2.2  AST 46*  --   --   ALT 14*  --   --   ALKPHOS 113  --   --   BILITOT 1.6*  --   --    < > = values in this interval not displayed.   ------------------------------------------------------------------------------------------------------------------  Cardiac Enzymes Recent Labs  Lab 06/04/17 2140  TROPONINI 0.03*   ------------------------------------------------------------------------------------------------------------------  RADIOLOGY:  Dg Chest Port 1 View  Result Date: 06/08/2017 CLINICAL DATA:  Respiratory failure. EXAM: PORTABLE CHEST 1 VIEW COMPARISON:  Radiograph Jun 07, 2017. FINDINGS: Stable cardiomegaly with central pulmonary vascular congestion. No pneumothorax is noted. Left internal jugular dialysis catheter is unchanged in position. Stable bibasilar densities are noted concerning for edema with mild right pleural effusion. Bony thorax is unremarkable. Minimal left pleural effusion is noted. IMPRESSION: Stable bilateral pulmonary edema with pleural effusions. Electronically Signed   By: Marijo Conception, M.D.   On: 06/08/2017 07:10     ASSESSMENT AND PLAN:  84 year old male with history of PAF, chronic diastolic heart failure, hepatic cirrhosis and diabetes who presented to the emergency room due to GI bleed and acute kidney injury.   1.  Acute hypoxic respiratory failure due to pneumonia and fluid overload due to acute  kidney injury Patient wean from high flow to nasal cannula  2.  Septic shock due to pneumonia: Patient is now off of dopamine Cultures have been negative Has been treated with Unasyn and antibiotics have been discontinued.   3.  Acute on chronic kidney disease stage III: This is due to ATN Continue dialysis as per recommended by nephrology.  He was on CRRT at one point.  4.  GI bleed with anemia of chronic disease status post EGD showing gastritis/varices Continue PPI  5.  COPD without signs of exacerbation  6  Diabetes: Continue sliding scale  7.  Acute on chronic diastolic heart failure: Continue dialysis for pleural effusions and fluid overload  8.  Thrombocytopenia: Platelets have been low but stable Continue monitoring Management plans discussed with the patient and family and they are in agreement.  CODE STATUS: Full  TOTAL TIME TAKING CARE OF THIS PATIENT: 24 minutes.     POSSIBLE D/C 3-6 days, DEPENDING ON CLINICAL CONDITION.   Brigitta Pricer M.D on 06/09/2017 at 11:30 AM  Between 7am to 6pm - Pager - [phone] After 6pm go to [email or web address] - password [email or web address] [place] [facility]  Office  [phone]  CC: Primary care physician; Ria Bush, MD  Note: This dictation was prepared with Dragon dictation along with smaller phrase technology. Any transcriptional errors that result from this process are unintentional.

## 2017-06-09 NOTE — Clinical Social Work Note (Signed)
CSW received referral for SNF.  Case discussed with case manager and plan is to discharge home with home health.  CSW to sign off please re-consult if social work needs arise.  Anne [place]bring R. Theus Espin, MSW, LCSWA [phone]  

## 2017-06-09 NOTE — Progress Notes (Signed)
Post HD assessment. Pt tolerated tx well without c/o or complications. Net UF 3025, goal met.    06/09/17 1925  Vital Signs  Temp 98.2 F (36.8 C)  Temp Source Oral  Pulse Rate 70  Pulse Rate Source Monitor  Resp 12  BP (!) 124/45  BP Location Left Arm  BP Method Automatic  Patient Position (if appropriate) Lying  Oxygen Therapy  SpO2 100 %  O2 Device Nasal Cannula  O2 Flow Rate (L/min) 5 L/min  Dialysis Weight  Weight 115.8 kg (255 lb 4.7 oz)  Type of Weight Post-Dialysis  Post-Hemodialysis Assessment  Rinseback Volume (mL) 250 mL  Dialyzer Clearance Lightly streaked  Duration of HD Treatment -hour(s) 3 hour(s)  Hemodialysis Intake (mL) 500 mL  UF Total -Machine (mL) 3525 mL  Net UF (mL) 3025 mL  Tolerated HD Treatment Yes  Education / Care Plan  Dialysis Education Provided Yes  Documented Education in Care Plan Yes  Hemodialysis Catheter Left Internal jugular Triple-lumen  Placement Date/Time: 05/31/17 1200   Placed prior to admission: No  Time Out: Correct patient;Correct site;Correct procedure  Maximum sterile barrier precautions: Hand hygiene;Sterile gloves;Cap;Large sterile sheet;Mask;Sterile gown  Site Prep: Chlorh...  Site Condition No complications  Blue Lumen Status Heparin locked  Red Lumen Status Heparin locked  Purple Lumen Status N/A  Catheter fill solution Heparin 1000 units/ml  Catheter fill volume (Arterial) 1.4 cc  Catheter fill volume (Venous) 1.4  Dressing Type Biopatch  Dressing Status Clean;Dry;Intact  Drainage Description None  Post treatment catheter status Capped and Clamped

## 2017-06-09 NOTE — Progress Notes (Signed)
Pre HD assessment    06/09/17 1604  Neurological  Level of Consciousness Alert  Orientation Level Oriented X4  Respiratory  Respiratory Pattern Regular;Unlabored  Chest Assessment Chest expansion symmetrical  Cardiac  ECG Monitor Yes  Vascular  R Radial Pulse +2  L Radial Pulse +2  Integumentary  Integumentary (WDL) X  Skin Color Appropriate for ethnicity  Musculoskeletal  Musculoskeletal (WDL) X  Generalized Weakness Yes  Assistive Device None  GU Assessment  Genitourinary (WDL) X  Genitourinary Symptoms  (HD)  Psychosocial  Psychosocial (WDL) WDL

## 2017-06-09 NOTE — Progress Notes (Signed)
Post HD assessment    06/09/17 1924  Neurological  Level of Consciousness Alert  Orientation Level Oriented X4  Respiratory  Respiratory Pattern Regular;Unlabored  Cardiac  ECG Monitor Yes  Vascular  R Radial Pulse +2  L Radial Pulse +2  Edema Generalized;Right upper extremity;Left upper extremity;Right lower extremity;Left lower extremity  Integumentary  Integumentary (WDL) X  Skin Color Appropriate for ethnicity  Musculoskeletal  Musculoskeletal (WDL) X  Generalized Weakness Yes  Assistive Device None  GU Assessment  Genitourinary (WDL) X  Genitourinary Symptoms  (HD)  Psychosocial  Psychosocial (WDL) WDL

## 2017-06-09 NOTE — Progress Notes (Signed)
HD tx end   06/09/17 1915  Vital Signs  Pulse Rate 70  Pulse Rate Source Monitor  Resp (!) 25  BP (!) 138/47  BP Location Left Arm  BP Method Automatic  Patient Position (if appropriate) Lying  Oxygen Therapy  SpO2 100 %  O2 Device Nasal Cannula  O2 Flow Rate (L/min) 5 L/min  During Hemodialysis Assessment  Dialysis Fluid Bolus Normal Saline  Bolus Amount (mL) 250 mL  Intra-Hemodialysis Comments Tx completed

## 2017-06-09 NOTE — Care Management Note (Signed)
Case Management Note  Patient Details  Name: [name] MRN: [id number] Date of Birth: 01/23/1934  Subjective/Objective:   Admitted to [facility] with the diagnosis of acute kidney injury. Lives with wife Enid Derry [phone]). Daughter is [name] [phone]) Last seen Dr. [name] 05/20/17, Prescriptions are filled at [facility]. No home health in the past, No skilled nursing in the past, Home oxygen for many years. Can't remember name of company. Rolling walker, rollayor, and cane in the home. Self feed, self dress, and self bath. Doesn't drive anymore.No falls good appetite. Family will transport                 Action/Plan: Physical therapy evaluation completed. Recommending skilled nursing, Declining this recommendation at this time, Discussed home health services. "Talk to my daughter."   Expected Discharge Date:                  Expected Discharge Plan:     In-House Referral:     Discharge planning Services     Post Acute Care Choice:    Choice offered to:     DME Arranged:    DME Agency:     HH Arranged:    HH Agency:     Status of Service:     If discussed at [employer], dates discussed:    Additional Comments:  Shelbie Ammons, RN MSN CCM Care Management [phone]  06/09/2017, 2:25 PM

## 2017-06-09 NOTE — Progress Notes (Signed)
HD tx start    06/09/17 1610  Vital Signs  Pulse Rate 75  Pulse Rate Source Monitor  Resp (!) 24  BP (!) 138/45  BP Location Left Arm  BP Method Automatic  Patient Position (if appropriate) Lying  Oxygen Therapy  SpO2 100 %  O2 Device Nasal Cannula  O2 Flow Rate (L/min) 5 L/min  During Hemodialysis Assessment  Blood Flow Rate (mL/min) 400 mL/min  Arterial Pressure (mmHg) -140 mmHg  Venous Pressure (mmHg) 130 mmHg  Transmembrane Pressure (mmHg) 20 mmHg  Ultrafiltration Rate (mL/min) 1170 mL/min  Dialysate Flow Rate (mL/min)  (SEQ)  Conductivity: Machine  14.4  HD Safety Checks Performed Yes  Dialysis Fluid Bolus Normal Saline  Bolus Amount (mL) 250 mL  Intra-Hemodialysis Comments Tx initiated  Hemodialysis Catheter Left Internal jugular Triple-lumen  Placement Date/Time: 05/31/17 1200   Placed prior to admission: No  Time Out: Correct patient;Correct site;Correct procedure  Maximum sterile barrier precautions: Hand hygiene;Sterile gloves;Cap;Large sterile sheet;Mask;Sterile gown  Site Prep: Chlorh...  Blue Lumen Status Infusing  Red Lumen Status Infusing

## 2017-06-09 NOTE — Progress Notes (Signed)
[facility]  ROUNDING NOTE   Subjective:   Daughter at bedside  UOP 640 - IV furosemide   5L [place]  Moved to 2A  Objective:  Vital signs in last 24 hours:  Temp:  [97.8 F (36.6 C)-98.9 F (37.2 C)] 97.8 F (36.6 C) (05/16 0816) Pulse Rate:  [74-82] 78 (05/16 0816) Resp:  [18] 18 (05/16 0331) BP: (130-139)/(47-64) 138/50 (05/16 0816) SpO2:  [88 %-99 %] 99 % (05/16 0816) Weight:  [114.9 kg (253 lb 6.4 oz)] 114.9 kg (253 lb 6.4 oz) (05/16 0331)  Weight change: -2.553 kg (-5 lb 10.1 oz) Filed Weights   06/08/17 0426 06/08/17 1211 06/09/17 0331  Weight: 113.4 kg (250 lb) 116.8 kg (257 lb 9.6 oz) 114.9 kg (253 lb 6.4 oz)    Intake/Output: I/O last 3 completed shifts: In: 120 [P.O.:120] Out: 740 [Urine:740]   Intake/Output this shift:  No intake/output data recorded.  Physical Exam: General: Laying in bed  Head: Normocephalic, atraumatic.   Eyes: Anicteric  Neck: Supple, trachea midline  Lungs:  Scattered rhonchi and rales, 5L [place]  Heart: regular  Abdomen:  Soft, nontender, bowel sounds present, urostomy in place  Extremities: 2-3+ peripheral edema in all four extremities  Neurologic: Alert and oriented  Skin: No lesions  Access: Left IJ temporary dialysis catheter    Basic Metabolic Panel: Recent Labs  Lab 06/05/17 1749 06/05/17 2218 06/06/17 0406 06/06/17 1727 06/07/17 0347 06/08/17 0422 06/09/17 0525  NA 136 136 137  --  138 138 139  K 4.4 4.3 4.2  --  3.7 3.9 4.0  CL 105 106 105  --  106 106 105  CO2 27 27 28   --  29 29 29   GLUCOSE 85 78 74  --  78 92 88  BUN 11 11 9   --  7 10 16   CREATININE 1.06 0.96 1.09  --  1.03 1.50* 1.66*  CALCIUM 8.0* 7.8* 7.9*  --  7.2* 7.4* 7.9*  MG 1.8 1.7 1.6*  --  1.7  --  2.2  PHOS 2.3* 2.2* 2.3* 2.1* 2.0*  2.0*  --  3.1    Liver Function Tests: Recent Labs  Lab 06/04/17 2140  06/05/17 1451 06/05/17 1749 06/05/17 2218 06/06/17 0406 06/07/17 0347  AST 46*  --   --   --   --   --   --   ALT 14*   --   --   --   --   --   --   ALKPHOS 113  --   --   --   --   --   --   BILITOT 1.6*  --   --   --   --   --   --   PROT 5.1*  --   --   --   --   --   --   ALBUMIN 2.1*  2.2*   < > 2.1* 2.1* 2.1* 2.1* 2.0*   < > = values in this interval not displayed.   No results for input(s): LIPASE, AMYLASE in the last 168 hours. Recent Labs  Lab 06/04/17 2140  AMMONIA 50*    CBC: Recent Labs  Lab 06/03/17 0433 06/05/17 1451 06/08/17 0422  WBC 5.8 3.7* 3.3*  NEUTROABS 3.6 2.5  --   HGB 9.8* 8.7* 8.6*  HCT 28.8* 26.2* 25.3*  MCV 95.7 93.7 93.1  PLT 160 73* 84*    Cardiac Enzymes: Recent Labs  Lab 06/04/17 2140  TROPONINI 0.03*  BNP: Invalid input(s): POCBNP  CBG: Recent Labs  Lab 06/08/17 1149 06/08/17 1641 06/08/17 2116 06/09/17 0817 06/09/17 1220  GLUCAP 104* 115* 93 18 104*    Microbiology: Results for orders placed or performed during the hospital encounter of 05/30/17  Culture, blood (routine x 2)     Status: None   Collection Time: 05/31/17 12:05 AM  Result Value Ref Range Status   Specimen Description BLOOD RIGHT HAND  Final   Special Requests   Final    BOTTLES DRAWN AEROBIC AND ANAEROBIC Blood Culture adequate volume   Culture   Final    NO GROWTH 5 DAYS Performed at [facility], [address] [place], [place] [address]    Report Status 06/05/2017 FINAL  Final  Urine culture     Status: Abnormal   Collection Time: 05/31/17 12:05 AM  Result Value Ref Range Status   Specimen Description   Final    URINE, RANDOM Performed at [facility], [address], [place], [place] [address]    Special Requests   Final    NONE Performed at [facility], [address], [place], [place] [address]    Culture MULTIPLE SPECIES PRESENT, SUGGEST RECOLLECTION (A)  Final   Report Status 06/01/2017 FINAL  Final  Culture, blood (routine x 2)     Status: None   Collection Time: 05/31/17 12:08 AM  Result Value Ref Range Status    Specimen Description BLOOD LEFT AC  Final   Special Requests   Final    BOTTLES DRAWN AEROBIC AND ANAEROBIC Blood Culture adequate volume   Culture   Final    NO GROWTH 5 DAYS Performed at [facility], [address] [place], [place] [address]    Report Status 06/05/2017 FINAL  Final  MRSA PCR Screening     Status: None   Collection Time: 05/31/17  9:47 AM  Result Value Ref Range Status   MRSA by PCR NEGATIVE NEGATIVE Final    Comment:        The GeneXpert MRSA Assay (FDA approved for NASAL specimens only), is one component of a comprehensive MRSA colonization surveillance program. It is not intended to diagnose MRSA infection nor to guide or monitor treatment for MRSA infections. Performed at [facility], [address], [place], [place] [address]   Urine Culture     Status: Abnormal   Collection Time: 06/02/17  5:27 PM  Result Value Ref Range Status   Specimen Description   Final    URINE, RANDOM Performed at [facility], [address] [place], [place] [address]    Special Requests   Final    NONE Performed at [facility], [address] [place], [place] [address]    Culture (A)  Final    <10,000 COLONIES/mL INSIGNIFICANT GROWTH Performed at [facility] [address], [place], [place] [address]    Report Status 06/03/2017 FINAL  Final    Coagulation Studies: No results for input(s): LABPROT, INR in the last 72 hours.  Urinalysis: No results for input(s): COLORURINE, LABSPEC, PHURINE, GLUCOSEU, HGBUR, BILIRUBINUR, KETONESUR, PROTEINUR, UROBILINOGEN, NITRITE, LEUKOCYTESUR in the last 72 hours.  Invalid input(s): APPERANCEUR    Imaging: Dg Chest Port 1 View  Result Date: 06/08/2017 CLINICAL DATA:  Respiratory failure. EXAM: PORTABLE CHEST 1 VIEW COMPARISON:  Radiograph Jun 07, 2017. FINDINGS: Stable cardiomegaly with central pulmonary vascular congestion. No pneumothorax is noted. Left internal jugular  dialysis catheter is unchanged in  position. Stable bibasilar densities are noted concerning for edema with mild right pleural effusion. Bony thorax is unremarkable. Minimal left pleural effusion is noted. IMPRESSION: Stable bilateral pulmonary edema with pleural effusions. Electronically Signed   By: Marijo Conception, M.D.   On: 06/08/2017 07:10     Medications:    . docusate sodium  100 mg Oral BID  . feeding supplement (ENSURE ENLIVE)  237 mL Oral TID BM  . ferrous sulfate  325 mg Oral Q breakfast  . insulin aspart  0-5 Units Subcutaneous QHS  . insulin aspart  0-9 Units Subcutaneous TID WC  . magnesium oxide  400 mg Oral Daily  . multivitamin with minerals  1 tablet Oral Daily  . nystatin   Topical TID  . pantoprazole  40 mg Oral BID  . simvastatin  20 mg Oral QHS  . sodium chloride flush  10-40 mL Intracatheter Q12H  . umeclidinium-vilanterol  1 puff Inhalation Daily  . vitamin B-12  250 mcg Oral Daily   acetaminophen **OR** [DISCONTINUED] acetaminophen, albuterol, morphine injection, [DISCONTINUED] ondansetron **OR** ondansetron (ZOFRAN) IV, sodium chloride, sodium chloride flush  Assessment/ Plan:  Mr. GERIK COBERLY is a 84 y.o. white male with atrial fibrillation, congestive heart failure (no recent echo), COPD, hepatic cirrhosis, diabetes mellitus type II, gastric varices, history of bladder cancer status post ileal conduit, hypertension, hyperlipidemia who was admitted to [facility] on 05/30/2017 for GI bleed and acute renal failure. Placed on CRRT from 5/8 to 5/9, then on 5/9 had intermitted hemodialysis treatment, restarted on CRRT  5/10 to 5/13. Intermittent hemodialysis on 5/13.   1.  Acute renal failure with hyperkalemia n chronic kidney disease stage III baseline creatinine of 1.8 GFR of 38 on 04/19/17.  Acute renal failure secondary to ATN. Oliguric urine output.  Chronic kidney disease secondary to obstructive uropathy, hypertension, and diabetes.  - Requiring renal replacement  therapy since admission.  - Transitioned from CRRT to intermittent hemodialysis yesterday. Intermitted hemodialysis treatment for later today - Monitor daily for dialysis needs.   2.  Anemia of chronic kidney disease with thrombocytopenia: hemoglobin 8.6. GI bleed on amdisssion. Endoscopy on 5/8 by Dr. Vicente Males finding gastric varices  - Iron supplements - pantoprazole  3. Hypertension with atrial fibrillation: heart rate and blood pressure at goal.   4. Acute respiratory failure: requiring O2. With pneumonia, right pleural effusion and pulmonary edema.  - appreciate Pulmonary input - Completed 7 days of Unasyn.  - Off HFNC   LOS: 9 Eliah Marquard 5/16/201912:42 PM

## 2017-06-10 DIAGNOSIS — J9600 Acute respiratory failure, unspecified whether with hypoxia or hypercapnia: Secondary | ICD-10-CM

## 2017-06-10 DIAGNOSIS — R001 Bradycardia, unspecified: Secondary | ICD-10-CM

## 2017-06-10 DIAGNOSIS — I959 Hypotension, unspecified: Secondary | ICD-10-CM

## 2017-06-10 LAB — BASIC METABOLIC PANEL
Anion gap: 3 — ABNORMAL LOW (ref 5–15)
BUN: 21 mg/dL — ABNORMAL HIGH (ref 6–20)
CO2: 30 mmol/L (ref 22–32)
Calcium: 7.9 mg/dL — ABNORMAL LOW (ref 8.9–10.3)
Chloride: 107 mmol/L (ref 101–111)
Creatinine, Ser: 1.54 mg/dL — ABNORMAL HIGH (ref 0.61–1.24)
GFR calc Af Amer: 47 mL/min — ABNORMAL LOW (ref >60)
GFR, EST NON AFRICAN AMERICAN: 41 mL/min — AB (ref >60)
Glucose, Bld: 101 mg/dL — ABNORMAL HIGH (ref 65–99)
POTASSIUM: 4.2 mmol/L (ref 3.5–5.1)
SODIUM: 140 mmol/L (ref 135–145)

## 2017-06-10 LAB — GLUCOSE, CAPILLARY
Glucose-Capillary: 79 mg/dL (ref 65–99)
Glucose-Capillary: 131 mg/dL — ABNORMAL HIGH (ref 65–99)
Glucose-Capillary: 127 mg/dL — ABNORMAL HIGH (ref 65–99)
Glucose-Capillary: 127 mg/dL — ABNORMAL HIGH (ref 65–99)

## 2017-06-10 LAB — CBC
HCT: 24.3 % — ABNORMAL LOW (ref 40.0–52.0)
Hemoglobin: 8.1 g/dL — ABNORMAL LOW (ref 13.0–18.0)
MCH: 31 pg (ref 26.0–34.0)
MCHC: 33.2 g/dL (ref 32.0–36.0)
MCV: 93.4 fL (ref 80.0–100.0)
PLATELETS: 107 10*3/uL — AB (ref 150–440)
RBC: 2.6 MIL/uL — AB (ref 4.40–5.90)
RDW: 16.6 % — ABNORMAL HIGH (ref 11.5–14.5)
WBC: 3.2 10*3/uL — ABNORMAL LOW (ref 3.8–10.6)

## 2017-06-10 MED ORDER — FUROSEMIDE 10 MG/ML IJ SOLN
80.0000 mg | Freq: Once | INTRAMUSCULAR | Status: AC
  Administered 2017-06-10: 80 mg via INTRAVENOUS
  Filled 2017-06-10: qty 8

## 2017-06-10 MED ORDER — GUAIFENESIN-DM 100-10 MG/5ML PO SYRP
5.0000 mL | ORAL_SOLUTION | ORAL | Status: DC | PRN
  Administered 2017-06-10 – 2017-06-14 (×2): 5 mL via ORAL
  Filled 2017-06-10 (×2): qty 5

## 2017-06-10 NOTE — Progress Notes (Signed)
Pharmacy Electrolyte Monitoring Consult:  Pharmacy consulted to assist in monitoring and replacing electrolytes in this 84 y.o. male admitted on 05/30/2017 with Weakness  Labs:  Sodium (mmol/L)  Date Value  06/10/2017 140  06/15/2011 141   Potassium (mmol/L)  Date Value  06/10/2017 4.2  06/15/2011 3.7   Magnesium (mg/dL)  Date Value  06/09/2017 2.2  06/14/2011 1.7 (L)   Phosphorus (mg/dL)  Date Value  06/09/2017 3.1   Calcium (mg/dL)  Date Value  06/10/2017 7.9 (L)   Calcium, Total (mg/dL)  Date Value  06/15/2011 8.3 (L)   Albumin (g/dL)  Date Value  06/07/2017 2.0 (L)  06/11/2011 4.1    Assessment/Plan: 5/16 Electrolytes WNL. No supplementation needed. Patient on Mag-Ox 400 mg PO daily. Currently on intermittant Hemodialysis-monitoring daily per Nephrology Will recheck electrolytes with AM   labs and continue to replace as needed.   Chinita Greenland PharmD Clinical Pharmacist 06/10/2017

## 2017-06-10 NOTE — Progress Notes (Signed)
[facility] [place] NAME: [name]    MR#:  [id number]  DATE OF BIRTH:  26-Jun-1933  SUBJECTIVE:   At bedside.  Patient is better this morning.  Respiratory status is improved.  No plans for dialysis today.  REVIEW OF SYSTEMS:    Review of Systems  Constitutional: Negative for fever, chills weight loss HENT: Negative for ear pain, nosebleeds, congestion, facial swelling, rhinorrhea, neck pain, neck stiffness and ear discharge.   Respiratory: ++ for cough, shortness of breath (improved), no wheezing  Cardiovascular: Negative for chest pain, palpitations and leg swelling.  Gastrointestinal: Negative for heartburn, abdominal pain, vomiting, diarrhea or consitpation Genitourinary: Negative for dysuria, urgency, frequency, hematuria Musculoskeletal: Negative for back pain or joint pain Neurological: Negative for dizziness, seizures, syncope, focal weakness,  numbness and headaches.  Hematological: Does not bruise/bleed easily.  Psychiatric/Behavioral: Negative for hallucinations, confusion, dysphoric mood    Tolerating Diet: yes      DRUG ALLERGIES:  No Known Allergies  VITALS:  Blood pressure (!) 136/48, pulse 68, temperature 97.9 F (36.6 C), temperature source Oral, resp. rate 16, height 5\' 8"  (1.727 m), weight 110.4 kg (243 lb 4.8 oz), SpO2 95 %.  PHYSICAL EXAMINATION:  Constitutional: Appears well-developed and well-nourished. No distress. Wearing high flow nasal cannula HENT: Normocephalic. Marland Kitchen Oropharynx is clear and moist.  Eyes: Conjunctivae and EOM are normal. PERRLA, no scleral icterus.  Neck: Normal ROM. Neck supple. No JVD. No tracheal deviation. CVS: RRR, S1/S2 +, no murmurs, no gallops, no carotid bruit.  Pulmonary: b/l rhonchi no wheezing Abdominal: Soft. BS +,  no distension, tenderness, rebound or guarding.  Musculoskeletal: Normal range of motion. 3+ edema and no tenderness. Neuro: Alert. CN 2-12 grossly intact.  No focal deficits. Skin: Skin is warm and dry. No rash noted. Psychiatric: Normal mood and affect.      LABORATORY PANEL:   CBC Recent Labs  Lab 06/10/17 0600  WBC 3.2*  HGB 8.1*  HCT 24.3*  PLT 107*   ------------------------------------------------------------------------------------------------------------------  Chemistries  Recent Labs  Lab 06/04/17 2140  06/09/17 0525 06/10/17 0600  NA 135   < > 139 140  K 4.6   < > 4.0 4.2  CL 106   < > 105 107  CO2 26   < > 29 30  GLUCOSE 90   < > 88 101*  BUN 15   < > 16 21*  CREATININE 1.17   < > 1.66* 1.54*  CALCIUM 7.9*   < > 7.9* 7.9*  MG 1.7   < > 2.2  --   AST 46*  --   --   --   ALT 14*  --   --   --   ALKPHOS 113  --   --   --   BILITOT 1.6*  --   --   --    < > = values in this interval not displayed.   ------------------------------------------------------------------------------------------------------------------  Cardiac Enzymes Recent Labs  Lab 06/04/17 2140  TROPONINI 0.03*   ------------------------------------------------------------------------------------------------------------------  RADIOLOGY:  No results found.   ASSESSMENT AND PLAN:   84 year old male with history of PAF, chronic diastolic heart failure, hepatic cirrhosis and diabetes who presented to the emergency room due to GI bleed and acute kidney injury.   1.  Acute hypoxic respiratory failure due to pneumonia and fluid overload due to acute kidney injury Patient wean from high flow to nasal cannula  2.  Septic shock due to pneumonia:  Patient is now off of dopamine Cultures have been negative Has been treated with Unasyn and antibiotics have been discontinued.   3.  Acute on chronic kidney disease stage III: This is due to ATN Continue dialysis as per recommended by nephrology.  He was on CRRT at one point. Plan to give Lasix today and see if this helps with patient's kidney function. No dialysis planned today  4.  GI  bleed with anemia of chronic disease status post EGD showing gastritis/varices Continue PPI  5.  COPD without signs of exacerbation  6  Diabetes: Continue sliding scale  7.  Acute on chronic diastolic heart failure: Continue dialysis for pleural effusions and fluid overload as indicated by nephrology.  8.  Thrombocytopenia: Platelets have been low but stable Continue monitoring Management plans discussed with the patient and family and they are in agreement.  CODE STATUS: Full  TOTAL TIME TAKING CARE OF THIS PATIENT: 24 minutes.     POSSIBLE D/C 3 days, DEPENDING ON CLINICAL CONDITION.   Lamario Mani M.D on 06/10/2017 at 12:08 PM  Between 7am to 6pm - Pager - [phone] After 6pm go to [email or web address] - password [email or web address] [facility]  Office  [phone]  CC: Primary care physician; Ria Bush, MD  Note: This dictation was prepared with Dragon dictation along with smaller phrase technology. Any transcriptional errors that result from this process are unintentional.

## 2017-06-10 NOTE — NC FL2 (Signed)
[place] LEVEL OF CARE SCREENING TOOL     IDENTIFICATION  Patient Name: [name] Birthdate: 22-Apr-1933 Sex: male Admission Date (Current Location): 05/30/2017  [place] and [place] Number:  Engineering geologist and Address:  [facility], [address] [place], [place] [address]      Provider Number: [id number]  Attending Physician Name and Address:  Bettey Costa, MD  Relative Name and Phone Number:       Current Level of Care: Hospital Recommended Level of Care: [place] Prior Approval Number:    Date Approved/Denied:   PASRR Number: [id number])  Discharge Plan: SNF    Current Diagnoses: Patient Active Problem List   Diagnosis Date Noted  . Cirrhosis of liver with ascites ([place])   . Palliative care by specialist   . Advance care planning   . Acute kidney injury superimposed on chronic kidney disease ([place]) 05/31/2017  . Varicose veins of both legs with edema 11/26/2016  . Bilateral leg weakness 10/15/2016  . Cold intolerance 12/16/2015  . Generalized weakness 09/09/2015  . Health maintenance examination 03/12/2015  . Psoriasis 09/09/2014  . Advanced care planning/counseling discussion 02/12/2014  . Presence of urostomy ([place]) 08/02/2013  . Medicare annual wellness visit, subsequent 02/09/2013  . Leg edema 07/05/2012  . Radicular leg pain 12/07/2011  . Cirrhosis, cryptogenic ([place]) 07/06/2011  . Anti-Duffy a antibodies present 07/06/2011  . Duodenal nodule 07/06/2011  . Gastric varices 06/30/2011  . Gastric and duodenal angiodysplasia 06/28/2011  . Diastolic CHF ([place]) 20/94/7096  . Neoplasm of uncertain behavior of skin 07/30/2010  . COPD, severe ([place]) 07/30/2010  . Atrial fibrillation ([place]) 06/23/2010  . Pancytopenia ([place]) 11/28/2007  . Iron deficiency anemia 04/03/2007  . INGUINAL HERNIA, LEFT 04/03/2007  . HEMORRHOIDS, INTERNAL 10/26/2006  . Controlled diabetes mellitus type 2 with complications  ([place]) 28/36/6294  . HLD (hyperlipidemia) 07/14/2006  . Essential hypertension 07/14/2006  . AORTIC INSUFFICIENCY 07/14/2006    Orientation RESPIRATION BLADDER Height & Weight     Self, Time, Situation, Place  O2(2 Liters Oxygen. ) Continent Weight: 243 lb 4.8 oz (110.4 kg) Height:  5\' 8"  (172.7 cm)  BEHAVIORAL SYMPTOMS/MOOD NEUROLOGICAL BOWEL NUTRITION STATUS      Continent Diet(Diet: DYS 3 )  AMBULATORY STATUS COMMUNICATION OF NEEDS Skin   Extensive Assist Verbally Other (Comment)(IJ drain in neck. )                       Personal Care Assistance Level of Assistance  Bathing, Feeding, Dressing Bathing Assistance: Limited assistance Feeding assistance: Independent Dressing Assistance: Limited assistance     Functional Limitations Info  Sight, Hearing, Speech Sight Info: Adequate Hearing Info: Adequate Speech Info: Adequate    SPECIAL CARE FACTORS FREQUENCY  PT (By licensed PT), OT (By licensed OT)     PT Frequency: (5) OT Frequency: (5)            Contractures      Additional Factors Info  Code Status, Allergies Code Status Info: (DNR ) Allergies Info: (No Known Allergies. )           Current Medications (06/10/2017):  This is the current hospital active medication list Current Facility-Administered Medications  Medication Dose Route Frequency Provider Last Rate Last Dose  . acetaminophen (TYLENOL) tablet 650 mg  650 mg Oral Q4H PRN Flora Lipps, MD   650 mg at 05/31/17 1820  . albuterol (PROVENTIL) (2.5 MG/3ML) 0.083% nebulizer solution 2.5 mg  2.5 mg Nebulization Q4H PRN Harrie Foreman, MD   2.5 mg at 06/07/17 0156  . docusate sodium (COLACE) capsule 100 mg  100 mg Oral BID Harrie Foreman, MD   100 mg at 06/10/17 9024  . feeding supplement (ENSURE ENLIVE) (ENSURE ENLIVE) liquid 237 mL  237 mL Oral TID BM Bettey Costa, MD   237 mL at 06/10/17 0924  . ferrous sulfate tablet 325 mg  325 mg Oral Q breakfast Harrie Foreman, MD   325 mg at  06/10/17 0973  . insulin aspart (novoLOG) injection 0-5 Units  0-5 Units Subcutaneous QHS Harrie Foreman, MD      . insulin aspart (novoLOG) injection 0-9 Units  0-9 Units Subcutaneous TID WC Harrie Foreman, MD   1 Units at 06/10/17 1203  . magnesium oxide (MAG-OX) tablet 400 mg  400 mg Oral Daily Harrie Foreman, MD   400 mg at 06/10/17 0925  . morphine 2 MG/ML injection 1 mg  1 mg Intravenous Q6H PRN Conforti, John, DO   1 mg at 06/01/17 1551  . multivitamin with minerals tablet 1 tablet  1 tablet Oral Daily Bettey Costa, MD   1 tablet at 06/10/17 0924  . nystatin (MYCOSTATIN/NYSTOP) topical powder   Topical TID Conforti, John, DO      . ondansetron (ZOFRAN) injection 4 mg  4 mg Intravenous Q6H PRN Harrie Foreman, MD      . pantoprazole (PROTONIX) EC tablet 40 mg  40 mg Oral BID Wilhelmina Mcardle, MD   40 mg at 06/10/17 5329  . simvastatin (ZOCOR) tablet 20 mg  20 mg Oral QHS Harrie Foreman, MD   20 mg at 06/09/17 2308  . sodium chloride (OCEAN) 0.65 % nasal spray 1 spray  1 spray Each Nare PRN Bettey Costa, MD   1 spray at 06/08/17 2246  . sodium chloride flush (NS) 0.9 % injection 10-40 mL  10-40 mL Intracatheter Q12H Salary, Montell D, MD   10 mL at 06/10/17 0925  . sodium chloride flush (NS) 0.9 % injection 10-40 mL  10-40 mL Intracatheter PRN Salary, Montell D, MD      . umeclidinium-vilanterol (ANORO ELLIPTA) 62.5-25 MCG/INH 1 puff  1 puff Inhalation Daily Harrie Foreman, MD   1 puff at 06/10/17 [phone]  . vitamin B-12 (CYANOCOBALAMIN) tablet 250 mcg  250 mcg Oral Daily Harrie Foreman, MD   250 mcg at 06/10/17 6834     Discharge Medications: Please see discharge summary for a list of discharge medications.  Relevant Imaging Results:  Relevant Lab Results:   Additional Information (SSN: [id number])  Yissel Habermehl, Veronia Beets, LCSW

## 2017-06-10 NOTE — Plan of Care (Signed)
  Problem: Clinical Measurements: Goal: Ability to maintain clinical measurements within normal limits will improve Outcome: Not Progressing Note:  BUN level today is elevated at 21. Will continue to monitor renal function. Wenda Low [facility]

## 2017-06-10 NOTE — Progress Notes (Signed)
[facility]  ROUNDING NOTE   Subjective:   Daughter at bedside  Hemodialysis - ultrafiltration only. Tolerated treatment well. UF goal 3032mL.   Laying flat in bed.   Objective:  Vital signs in last 24 hours:  Temp:  [97.9 F (36.6 C)-98.2 F (36.8 C)] 97.9 F (36.6 C) (05/17 0749) Pulse Rate:  [65-77] 68 (05/17 0749) Resp:  [12-25] 16 (05/17 0749) BP: (113-162)/(39-135) 136/48 (05/17 0749) SpO2:  [95 %-100 %] 95 % (05/17 0749) Weight:  [110.4 kg (243 lb 4.8 oz)-115.8 kg (255 lb 4.7 oz)] 110.4 kg (243 lb 4.8 oz) (05/17 0451)  Weight change: 1.953 kg (4 lb 4.9 oz) Filed Weights   06/09/17 1925 06/09/17 2032 06/10/17 0451  Weight: 115.8 kg (255 lb 4.7 oz) 110.5 kg (243 lb 9.6 oz) 110.4 kg (243 lb 4.8 oz)    Intake/Output: I/O last 3 completed shifts: In: 10 [I.V.:10] Out: 3220 [Urine:750; Other:3025]   Intake/Output this shift:  Total I/O In: 240 [P.O.:240] Out: 950 [Urine:950]  Physical Exam: General: Laying in bed  Head: Normocephalic, atraumatic.   Eyes: Anicteric  Neck: Supple, trachea midline  Lungs:  Scattered rhonchi and rales, [facility] 02  Heart: regular  Abdomen:  Soft, nontender, bowel sounds present, urostomy in place  Extremities: 2+ peripheral edema in all four extremities  Neurologic: Alert and oriented  Skin: No lesions  Access: Left IJ temporary dialysis catheter    Basic Metabolic Panel: Recent Labs  Lab 06/05/17 1749 06/05/17 2218 06/06/17 0406 06/06/17 1727 06/07/17 0347 06/08/17 0422 06/09/17 0525 06/10/17 0600  NA 136 136 137  --  138 138 139 140  K 4.4 4.3 4.2  --  3.7 3.9 4.0 4.2  CL 105 106 105  --  106 106 105 107  CO2 27 27 28   --  29 29 29 30   GLUCOSE 85 78 74  --  78 92 88 101*  BUN 11 11 9   --  7 10 16  21*  CREATININE 1.06 0.96 1.09  --  1.03 1.50* 1.66* 1.54*  CALCIUM 8.0* 7.8* 7.9*  --  7.2* 7.4* 7.9* 7.9*  MG 1.8 1.7 1.6*  --  1.7  --  2.2  --   PHOS 2.3* 2.2* 2.3* 2.1* 2.0*  2.0*  --  3.1  --     Liver  Function Tests: Recent Labs  Lab 06/04/17 2140  06/05/17 1451 06/05/17 1749 06/05/17 2218 06/06/17 0406 06/07/17 0347  AST 46*  --   --   --   --   --   --   ALT 14*  --   --   --   --   --   --   ALKPHOS 113  --   --   --   --   --   --   BILITOT 1.6*  --   --   --   --   --   --   PROT 5.1*  --   --   --   --   --   --   ALBUMIN 2.1*  2.2*   < > 2.1* 2.1* 2.1* 2.1* 2.0*   < > = values in this interval not displayed.   No results for input(s): LIPASE, AMYLASE in the last 168 hours. Recent Labs  Lab 06/04/17 2140  AMMONIA 50*    CBC: Recent Labs  Lab 06/05/17 1451 06/08/17 0422 06/10/17 0600  WBC 3.7* 3.3* 3.2*  NEUTROABS 2.5  --   --  HGB 8.7* 8.6* 8.1*  HCT 26.2* 25.3* 24.3*  MCV 93.7 93.1 93.4  PLT 73* 84* 107*    Cardiac Enzymes: Recent Labs  Lab 06/04/17 2140  TROPONINI 0.03*    BNP: Invalid input(s): POCBNP  CBG: Recent Labs  Lab 06/09/17 1549 06/09/17 2041 06/10/17 0748 06/10/17 1149 06/10/17 1615  GLUCAP 116* 103* 79 131* 127*    Microbiology: Results for orders placed or performed during the hospital encounter of 05/30/17  Culture, blood (routine x 2)     Status: None   Collection Time: 05/31/17 12:05 AM  Result Value Ref Range Status   Specimen Description BLOOD RIGHT HAND  Final   Special Requests   Final    BOTTLES DRAWN AEROBIC AND ANAEROBIC Blood Culture adequate volume   Culture   Final    NO GROWTH 5 DAYS Performed at [facility], [address], [place], [place] [address]    Report Status 06/05/2017 FINAL  Final  Urine culture     Status: Abnormal   Collection Time: 05/31/17 12:05 AM  Result Value Ref Range Status   Specimen Description   Final    URINE, RANDOM Performed at [facility], [address], [place], [place] [address]    Special Requests   Final    NONE Performed at [facility], [address] [place], [place] [address]    Culture MULTIPLE SPECIES PRESENT, SUGGEST  RECOLLECTION (A)  Final   Report Status 06/01/2017 FINAL  Final  Culture, blood (routine x 2)     Status: None   Collection Time: 05/31/17 12:08 AM  Result Value Ref Range Status   Specimen Description BLOOD LEFT AC  Final   Special Requests   Final    BOTTLES DRAWN AEROBIC AND ANAEROBIC Blood Culture adequate volume   Culture   Final    NO GROWTH 5 DAYS Performed at [facility], [address] [place], [place] [address]    Report Status 06/05/2017 FINAL  Final  MRSA PCR Screening     Status: None   Collection Time: 05/31/17  9:47 AM  Result Value Ref Range Status   MRSA by PCR NEGATIVE NEGATIVE Final    Comment:        The GeneXpert MRSA Assay (FDA approved for NASAL specimens only), is one component of a comprehensive MRSA colonization surveillance program. It is not intended to diagnose MRSA infection nor to guide or monitor treatment for MRSA infections. Performed at [facility], [address], [place], [place] [address]   Urine Culture     Status: Abnormal   Collection Time: 06/02/17  5:27 PM  Result Value Ref Range Status   Specimen Description   Final    URINE, RANDOM Performed at [facility], [address] [place], [place] [address]    Special Requests   Final    NONE Performed at [facility], [address] [place], [place] [address]    Culture (A)  Final    <10,000 COLONIES/mL INSIGNIFICANT GROWTH Performed at [facility] [address], [place],  [address]    Report Status 06/03/2017 FINAL  Final    Coagulation Studies: No results for input(s): LABPROT, INR in the last 72 hours.  Urinalysis: No results for input(s): COLORURINE, LABSPEC, PHURINE, GLUCOSEU, HGBUR, BILIRUBINUR, KETONESUR, PROTEINUR, UROBILINOGEN, NITRITE, LEUKOCYTESUR in the last 72 hours.  Invalid input(s): APPERANCEUR    Imaging: No results found.   Medications:    .  docusate sodium  100 mg Oral BID  . feeding  supplement (ENSURE ENLIVE)  237 mL Oral TID BM  . ferrous sulfate  325 mg Oral Q breakfast  . insulin aspart  0-5 Units Subcutaneous QHS  . insulin aspart  0-9 Units Subcutaneous TID WC  . magnesium oxide  400 mg Oral Daily  . multivitamin with minerals  1 tablet Oral Daily  . nystatin   Topical TID  . pantoprazole  40 mg Oral BID  . simvastatin  20 mg Oral QHS  . sodium chloride flush  10-40 mL Intracatheter Q12H  . umeclidinium-vilanterol  1 puff Inhalation Daily  . vitamin B-12  250 mcg Oral Daily   acetaminophen **OR** [DISCONTINUED] acetaminophen, albuterol, morphine injection, [DISCONTINUED] ondansetron **OR** ondansetron (ZOFRAN) IV, sodium chloride, sodium chloride flush  Assessment/ Plan:  Mr. PRECIOUS GILCHREST is a 84 y.o. white male with atrial fibrillation, congestive heart failure (no recent echo), COPD, hepatic cirrhosis, diabetes mellitus type II, gastric varices, history of bladder cancer status post ileal conduit, hypertension, hyperlipidemia who was admitted to [facility] on 05/30/2017 for GI bleed and acute renal failure. Placed on CRRT from 5/8 to 5/9, then on 5/9 had intermitted hemodialysis treatment, restarted on CRRT  5/10 to 5/13. Intermittent hemodialysis on 5/13.   1.  Acute renal failure with hyperkalemia on chronic kidney disease stage III baseline creatinine of 1.8 GFR of 38 on 04/19/17.  Acute renal failure secondary to ATN. Oliguric urine output.  Chronic kidney disease secondary to obstructive uropathy, hypertension, and diabetes.  - Requiring renal replacement therapy since admission.  - Transitioned from CRRT to intermittent hemodialysis on 5/14. Intermitted hemodialysis treatment for yesterday - Monitor daily for dialysis needs.  - IV furosemide 80mg  IV x 1 today  2.  Anemia of chronic kidney disease with thrombocytopenia: hemoglobin 8.1. GI bleed on amdisssion. Endoscopy on 5/8 by Dr. Vicente Males finding gastric varices  - Iron supplements - pantoprazole  3. Hypertension  with atrial fibrillation: heart rate and blood pressure at goal.   4. Acute respiratory failure: requiring O2. With pneumonia, right pleural effusion and pulmonary edema.  - appreciate Pulmonary input - Completed 7 days of Unasyn.    LOS: 10 Tyrice Hewitt 5/17/20194:35 PM

## 2017-06-10 NOTE — Clinical Social Work Note (Signed)
Clinical Social Work Assessment  Patient Details  Name: [name] MRN: [id number] Date of Birth: 03/25/1933  Date of referral:  06/10/17               Reason for consult:  Facility Placement                Permission sought to share information with:    Permission granted to share information::     Name::        Agency::     Relationship::     Contact Information:     Housing/Transportation Living arrangements for the past 2 months:  Single Family Home Source of Information:  Patient, Adult Children Patient Interpreter Needed:  None Criminal Activity/Legal Involvement Pertinent to Current Situation/Hospitalization:  No - Comment as needed Significant Relationships:  Adult Children, Spouse Lives with:  Spouse, Adult Children Do you feel safe going back to the place where you live?  Yes Need for family participation in patient care:  Yes (Comment)  Care giving concerns:  Patient lives in [place] with his wife Shirley and daughter/ HPOA Jennifer [phone].    Social Worker assessment / plan:  Clinical Social Worker (CSW) reviewed chart and noted that PT is recommending SNF. CSW met with patient and his daughter Jennifer was at bedside. Patient was laying in the bed and was alert and oriented X4. CSW introduced self and explained role of CSW department. Per daughter patient lives in [place] with his wife and daughter Jennifer. Per daughter patient was barely getting out of bed prior to hospital admission and they are use to caring for him in this condition. CSW explained SNF process. Patient and daughter refused SNF placement. Per patient his plan is to D/C home. Daughter requested a hospital bed and lift chair. RN case manager aware of above. CSW will continue to follow and assist as needed.   Employment status:  Disabled (Comment on whether or not currently receiving Disability) Insurance information:  Managed Medicare PT Recommendations:  Skilled Nursing  Facility Information / Referral to community resources:  Other (Comment Required)(Patient refused SNF. )  Patient/Family's Response to care:  Patient and daughter refused SNF placement.   Patient/Family's Understanding of and Emotional Response to Diagnosis, Current Treatment, and Prognosis:  Patient and his daughter were very pleasant and thanked CSW for visit.   Emotional Assessment Appearance:  Appears stated age Attitude/Demeanor/Rapport:    Affect (typically observed):  Pleasant Orientation:  Oriented to Self, Oriented to Place, Oriented to  Time, Oriented to Situation Alcohol / Substance use:  Not Applicable Psych involvement (Current and /or in the community):  No (Comment)  Discharge Needs  Concerns to be addressed:  Discharge Planning Concerns Readmission within the last 30 days:  No Current discharge risk:  Dependent with Mobility, Chronically ill Barriers to Discharge:  Continued Medical Work up   Sample, Bailey M, LCSW 06/10/2017, 11:56 AM  

## 2017-06-10 NOTE — Progress Notes (Signed)
Daily Progress Note   Patient Name: [name]       Date: 06/10/2017 DOB: 1933/08/10  Age: 84 y.o. MRN#: [id number] Attending Physician: Bettey Costa, MD Primary Care Physician: Ria Bush, MD Admit Date: 05/30/2017  Reason for Consultation/Follow-up: Establishing goals of care  Subjective: Patient resting in bed. Easily awakened. Alert and oriented x3. No family at bedside.  She denies any pain or discomfort at this time.  Also denies any shortness of breath.  He reports that he is being discharged home and the plan is he will be going home.  States he does not want to go to any kind of nursing facility and neither does his daughter.  Discussed allowing palliative medicine to follow him on the outpatient setting.  Patient is in agreement with this. Attempted to call daughter, [name]. Voicemail left.   Chart reviewed.  Length of Stay: 10  Current Medications: Scheduled Meds:  . docusate sodium  100 mg Oral BID  . feeding supplement (ENSURE ENLIVE)  237 mL Oral TID BM  . ferrous sulfate  325 mg Oral Q breakfast  . insulin aspart  0-5 Units Subcutaneous QHS  . insulin aspart  0-9 Units Subcutaneous TID WC  . magnesium oxide  400 mg Oral Daily  . multivitamin with minerals  1 tablet Oral Daily  . nystatin   Topical TID  . pantoprazole  40 mg Oral BID  . simvastatin  20 mg Oral QHS  . sodium chloride flush  10-40 mL Intracatheter Q12H  . umeclidinium-vilanterol  1 puff Inhalation Daily  . vitamin B-12  250 mcg Oral Daily    Continuous Infusions:   PRN Meds: acetaminophen **OR** [DISCONTINUED] acetaminophen, albuterol, morphine injection, [DISCONTINUED] ondansetron **OR** ondansetron (ZOFRAN) IV, sodium chloride, sodium chloride flush  Physical Exam  Constitutional: Vital  signs are normal. He appears well-developed. He is cooperative. He has a sickly appearance.  Neck:  Left jugular central line   Cardiovascular: Normal rate, regular rhythm, normal heart sounds, intact distal pulses and normal pulses.  Pulmonary/Chest: He has decreased breath sounds in the right lower field and the left lower field.  Some shortness of breath during conversation.   Abdominal: Soft. Bowel sounds are normal.  Musculoskeletal:  Bilateral lower edema   Neurological: He is alert.  Psychiatric: He has a normal mood  and affect. His speech is normal and behavior is normal. Judgment and thought content normal. Cognition and memory are normal.            Vital Signs: BP (!) 136/48 (BP Location: Left Arm)   Pulse 68   Temp 97.9 F (36.6 C) (Oral)   Resp 16   Ht 5\' 8"  (1.727 m)   Wt 110.4 kg (243 lb 4.8 oz)   SpO2 95%   BMI 36.99 kg/m  SpO2: SpO2: 95 % O2 Device: O2 Device: Nasal Cannula O2 Flow Rate: O2 Flow Rate (L/min): 5 L/min  Intake/output summary:   Intake/Output Summary (Last 24 hours) at 06/10/2017 1543 Last data filed at 06/10/2017 1457 Gross per 24 hour  Intake 250 ml  Output 4575 ml  Net -4325 ml   LBM: Last BM Date: 06/07/17 Baseline Weight: Weight: 97.5 kg (215 lb) Most recent weight: Weight: 110.4 kg (243 lb 4.8 oz)       Palliative Assessment/Data:PPS 20%   Flowsheet Rows     Most Recent Value  Intake Tab  Referral Department  Hospitalist  Unit at Time of Referral  Med/Surg Unit  Palliative Care Primary Diagnosis  Cancer  Date Notified  05/30/17  Palliative Care Type  New Palliative care  Reason for referral  Clarify Goals of Care  Date of Admission  05/30/17  Date first seen by Palliative Care  06/06/17  # of days Palliative referral response time  7 Day(s)  # of days IP prior to Palliative referral  0  Clinical Assessment  Psychosocial & Spiritual Assessment  Palliative Care Outcomes      Patient Active Problem List   Diagnosis Date  Noted  . Cirrhosis of liver with ascites ([place])   . Palliative care by specialist   . Advance care planning   . Acute kidney injury superimposed on chronic kidney disease ([place]) 05/31/2017  . Varicose veins of both legs with edema 11/26/2016  . Bilateral leg weakness 10/15/2016  . Cold intolerance 12/16/2015  . Generalized weakness 09/09/2015  . Health maintenance examination 03/12/2015  . Psoriasis 09/09/2014  . Advanced care planning/counseling discussion 02/12/2014  . Presence of urostomy ([place]) 08/02/2013  . Medicare annual wellness visit, subsequent 02/09/2013  . Leg edema 07/05/2012  . Radicular leg pain 12/07/2011  . Cirrhosis, cryptogenic ([place]) 07/06/2011  . Anti-Duffy a antibodies present 07/06/2011  . Duodenal nodule 07/06/2011  . Gastric varices 06/30/2011  . Gastric and duodenal angiodysplasia 06/28/2011  . Diastolic CHF ([place]) 63/87/5643  . Neoplasm of uncertain behavior of skin 07/30/2010  . COPD, severe ([place]) 07/30/2010  . Atrial fibrillation ([place]) 06/23/2010  . Pancytopenia ([place]) 11/28/2007  . Iron deficiency anemia 04/03/2007  . INGUINAL HERNIA, LEFT 04/03/2007  . HEMORRHOIDS, INTERNAL 10/26/2006  . Controlled diabetes mellitus type 2 with complications ([place]) 32/95/1884  . HLD (hyperlipidemia) 07/14/2006  . Essential hypertension 07/14/2006  . AORTIC INSUFFICIENCY 07/14/2006    Palliative Care Assessment & Plan   Patient Profile: 84 y.o. male  with past medical history of cryptongenic cirrhosis (w/ history of hypoalbuminemia), DM2, iron deficiency anemia, pancytopenia (presumed d/t cirrhosis), bladder cancer s/p cystectomy (very remote), HTN, severe COPD, Gastric and duodenal hyperdysplasia, a. Fib, CHF, CKD III, admitted on 05/30/2017 weakness, bradycardia, hypotension. Workup found him to be in acute on chronic renal failure- ?from dehydration r/t hemorrhagic gastritis discovered on endoscopy as well as septic shock d/t pneumonia. Renal u/s showed ascites. He has  required CRRT- is off now and attempts are being made  at intermittent dialysis. He is currently on HF nasal cannula with FIO2 at 35%. Chest xray shows ongoing pulmonary edema. Palliative medicine consulted for "GOC- Progressive decline despite treatment".    Recommendations/Plan:  DNR/DNI at patient's request  Continue to treat the treatable  Patient verbalized that he doesn't think he is interested in long-term dialysis if required and is also not interested in a facility for rehab. Would prefer to go home with rehab if required. SW is facilitating services to be set up along with equipment.   CM referral for outpatient palliative services. Family is not interested in hospice services at this time.    Palliative Medicine team will continue to support patient, family, and medical team during hospitalization as needed.   Goals of Care and Additional Recommendations:  Limitations on Scope of Treatment: Full Scope Treatment  Code Status:    Code Status Orders  (From admission, onward)        Start     Ordered   06/07/17 1603  Do not attempt resuscitation (DNR)  Continuous    Question Answer Comment  In the event of cardiac or respiratory ARREST Do not call a "code blue"   In the event of cardiac or respiratory ARREST Do not perform Intubation, CPR, defibrillation or ACLS   In the event of cardiac or respiratory ARREST Use medication by any route, position, wound care, and other measures to relive pain and suffering. May use oxygen, suction and manual treatment of airway obstruction as needed for comfort.      06/07/17 1602    Code Status History    Date Active Date Inactive Code Status Order ID Comments User Context   05/31/2017 0329 06/07/2017 1602 Full Code [id number]  Harrie Foreman, MD ED     Prognosis:   Unable to determine-guarded to poor in the setting of chronic kidney disease, acute on chronic anemia, pulmonary edema, acute hypoxemic respiratory failure, decreased  immobility, COPD, CRRT, cirrhosis, diabetes, CHF, and generalized weakness.  Discharge Planning:  To Be Determined  Care plan was discussed with patient, family,and bedside RN.  Thank you for allowing the Palliative Medicine Team to assist in the care of this patient.   Total Time 25 min.  Prolonged Time Billed  NO      Greater than 50%  of this time was spent counseling and coordinating care related to the above assessment and plan.  Alda Lea, NP-BC Palliative Medicine Team  Phone: [phone] Fax: [phone]  Please contact Palliative Medicine Team phone at [phone] for questions and concerns.

## 2017-06-10 NOTE — Care Management (Addendum)
Declining skilled nursing facility at this time. Left voice mail for daughter, [name] [phone]) to discuss home health agencies. Call back would like [facility] for nursing services. Possibly care rider when discharged.  Requested hospital bed and chair lift per [facility] Social Worker. Unsure of discharge date at this time.  Shelbie Ammons RN MSN CCM Care Management [phone]

## 2017-06-11 LAB — GLUCOSE, CAPILLARY
Glucose-Capillary: 85 mg/dL (ref 65–99)
Glucose-Capillary: 124 mg/dL — ABNORMAL HIGH (ref 65–99)
Glucose-Capillary: 95 mg/dL (ref 65–99)
Glucose-Capillary: 170 mg/dL — ABNORMAL HIGH (ref 65–99)

## 2017-06-11 LAB — BASIC METABOLIC PANEL
ANION GAP: 4 — AB (ref 5–15)
BUN: 22 mg/dL — ABNORMAL HIGH (ref 6–20)
CHLORIDE: 107 mmol/L (ref 101–111)
CO2: 31 mmol/L (ref 22–32)
Calcium: 8.1 mg/dL — ABNORMAL LOW (ref 8.9–10.3)
Creatinine, Ser: 1.49 mg/dL — ABNORMAL HIGH (ref 0.61–1.24)
GFR calc Af Amer: 49 mL/min — ABNORMAL LOW (ref >60)
GFR, EST NON AFRICAN AMERICAN: 42 mL/min — AB (ref >60)
Glucose, Bld: 98 mg/dL (ref 65–99)
POTASSIUM: 4.5 mmol/L (ref 3.5–5.1)
SODIUM: 142 mmol/L (ref 135–145)

## 2017-06-11 LAB — CBC
HEMATOCRIT: 24 % — AB (ref 40.0–52.0)
HEMOGLOBIN: 8 g/dL — AB (ref 13.0–18.0)
MCH: 31.3 pg (ref 26.0–34.0)
MCHC: 33.3 g/dL (ref 32.0–36.0)
MCV: 94.2 fL (ref 80.0–100.0)
Platelets: 112 10*3/uL — ABNORMAL LOW (ref 150–440)
RBC: 2.55 MIL/uL — AB (ref 4.40–5.90)
RDW: 17.6 % — ABNORMAL HIGH (ref 11.5–14.5)
WBC: 3.3 10*3/uL — AB (ref 3.8–10.6)

## 2017-06-11 MED ORDER — FUROSEMIDE 10 MG/ML IJ SOLN
80.0000 mg | Freq: Once | INTRAMUSCULAR | Status: AC
  Administered 2017-06-11: 80 mg via INTRAVENOUS
  Filled 2017-06-11: qty 8

## 2017-06-11 NOTE — Progress Notes (Signed)
Pharmacy Electrolyte Monitoring Consult:  Pharmacy consulted to assist in monitoring and replacing electrolytes in this 84 y.o. male admitted on 05/30/2017 with Weakness  Labs:  Sodium (mmol/L)  Date Value  06/11/2017 142  06/15/2011 141   Potassium (mmol/L)  Date Value  06/11/2017 4.5  06/15/2011 3.7   Magnesium (mg/dL)  Date Value  06/09/2017 2.2  06/14/2011 1.7 (L)   Phosphorus (mg/dL)  Date Value  06/09/2017 3.1   Calcium (mg/dL)  Date Value  06/11/2017 8.1 (L)   Calcium, Total (mg/dL)  Date Value  06/15/2011 8.3 (L)   Albumin (g/dL)  Date Value  06/07/2017 2.0 (L)  06/11/2011 4.1    Assessment/Plan: 5/18 Electrolytes WNL. No supplementation needed. Patient on Mag-Ox 400 mg PO daily. Currently on intermittent Hemodialysis-monitoring daily per Nephrology Will recheck electrolytes on Monday and continue to replace as needed.   Rayna Sexton, PharmD, BCPS Clinical Pharmacist 06/11/2017 8:02 AM

## 2017-06-11 NOTE — Progress Notes (Signed)
[place] [facility] [place] NAME: [name]    MR#:  [id number]  DATE OF BIRTH:  27-Jan-1933  SUBJECTIVE:   No other acute events overnight.  Patient had good urine output with 80 of IV Lasix and produce a 200 cc of urine.  Creatinine stable.  Discussed with nephrology and dialysis catheter removed today.  Patient denies any worsening shortness of breath.  REVIEW OF SYSTEMS:    Review of Systems  Constitutional: Negative for chills and fever.  HENT: Negative for congestion and tinnitus.   Eyes: Negative for blurred vision and double vision.  Respiratory: Negative for cough, shortness of breath and wheezing.   Cardiovascular: Negative for chest pain, orthopnea and PND.  Gastrointestinal: Negative for abdominal pain, diarrhea, nausea and vomiting.  Genitourinary: Negative for dysuria and hematuria.  Neurological: Positive for weakness (Generalized. ). Negative for dizziness, sensory change and focal weakness.  All other systems reviewed and are negative.   Nutrition: Dysphagia III with thin liquids Tolerating Diet: Yes Tolerating PT: Eval noted.   DRUG ALLERGIES:  No Known Allergies  VITALS:  Blood pressure (!) 145/52, pulse 85, temperature 97.7 F (36.5 C), temperature source Oral, resp. rate 14, height 5\' 8"  (1.727 m), weight 110.4 kg (243 lb 4.8 oz), SpO2 96 %.  PHYSICAL EXAMINATION:   Physical Exam  GENERAL:  84 y.o.-year-old patient lying in bed in no acute distress.  EYES: Pupils equal, round, reactive to light and accommodation. No scleral icterus. Extraocular muscles intact.  HEENT: Head atraumatic, normocephalic. Oropharynx and nasopharynx clear.  NECK:  Supple, no jugular venous distention. No thyroid enlargement, no tenderness.  LUNGS: Normal breath sounds bilaterally, no wheezing, bibasilar rales, No rhonchi. No use of accessory muscles of respiration.  CARDIOVASCULAR: S1, S2 normal. No murmurs, rubs, or gallops.  ABDOMEN: Soft,  nontender, nondistended. Bowel sounds present. No organomegaly or mass.  EXTREMITIES: No cyanosis, clubbing, + 2 edema b/l    NEUROLOGIC: Cranial nerves II through XII are intact. No focal Motor or sensory deficits b/l.  Globally weak. PSYCHIATRIC: The patient is alert and oriented x 3.  SKIN: No obvious rash, lesion, or ulcer.    LABORATORY PANEL:   CBC Recent Labs  Lab 06/11/17 0650  WBC 3.3*  HGB 8.0*  HCT 24.0*  PLT 112*   ------------------------------------------------------------------------------------------------------------------  Chemistries  Recent Labs  Lab 06/04/17 2140  06/09/17 0525  06/11/17 0650  NA 135   < > 139   < > 142  K 4.6   < > 4.0   < > 4.5  CL 106   < > 105   < > 107  CO2 26   < > 29   < > 31  GLUCOSE 90   < > 88   < > 98  BUN 15   < > 16   < > 22*  CREATININE 1.17   < > 1.66*   < > 1.49*  CALCIUM 7.9*   < > 7.9*   < > 8.1*  MG 1.7   < > 2.2  --   --   AST 46*  --   --   --   --   ALT 14*  --   --   --   --   ALKPHOS 113  --   --   --   --   BILITOT 1.6*  --   --   --   --    < > = values  in this interval not displayed.   ------------------------------------------------------------------------------------------------------------------  Cardiac Enzymes Recent Labs  Lab 06/04/17 2140  TROPONINI 0.03*   ------------------------------------------------------------------------------------------------------------------  RADIOLOGY:  No results found.   ASSESSMENT AND PLAN:   84 year old male with history of PAF, chronic diastolic heart failure, hepatic cirrhosis and diabetes who presented to the emergency room due to GI bleed and acute kidney injury.  1.  Acute hypoxic respiratory failure due and fluid overload due to acute kidney injury - Much improved with CRRT and also now with IV diuresis.   - cont. o2 supplementation and wean off as tolerated.   2.  Septic shock due to pneumonia: Much improved, off antibiotics, cultures have  been negative. -84 year old patient has been adequately treated with IV antibiotics which have not been discontinued.   3.  Acute on chronic kidney disease stage III: This is due to ATN -While the patient was in ICU on vasopressors he was on CRRT but now has been weaned off of it.  He is making good urine and currently is not dialysis dependent. -As per nephrology got 1 dose of IV Lasix and responded well to it.  Patient to get a 100 dose of IV Lasix today and will follow response.  Creatinine stable.  4.  GI bleed with anemia of chronic disease status post EGD showing gastritis/varices - Continue PPI. Hg. Stable.   5.  COPD without signs of exacerbation - cont. Anoro-Ellipta  6  Diabetes: Continue sliding scale. BS stable.   7.  Acute on chronic diastolic heart failure: Continue dialysis for Fluid removal and pt. Is improving.   8.  Thrombocytopenia: Platelets have been low but stable - Continue monitoring  Await PT eval.  Possible d/c home in next 1-2 days.   All the records are reviewed and case discussed with Care Management/Social Worker. Management plans discussed with the patient, family and they are in agreement.  CODE STATUS: DNR  DVT Prophylaxis: TED's & SCD's.   TOTAL TIME TAKING CARE OF THIS PATIENT: 30 minutes.   POSSIBLE D/C IN 1-2 DAYS, DEPENDING ON CLINICAL CONDITION.   Henreitta Leber M.D on 06/11/2017 at 2:08 PM  Between 7am to 6pm - Pager - [phone]  After 6pm go to [email or web address] - Proofreader  [facility]  Office  [phone]  CC: Primary care physician; Ria Bush, MD

## 2017-06-11 NOTE — Progress Notes (Signed)
Physical Therapy Treatment Patient Details Name: [name] MRN: [id number] DOB: Oct 02, 1933 Today's Date: 06/11/2017    History of Present Illness Pt admitted for AKI with complaints of weakness. According to pt, after vein surgery, pt drastically became weak and slid out of bed at home. Hospital stay complicated by CRRT and now temp IJ placed for HD. Recent admission for GI bleed and ARF. History includes CHF, Afib, COPD, DM, bladder ca, HTN.     PT Comments    Pt lethargic and reports feeling very nauseated today (lunch tray untouched) Pt wishes to try PT. Pt participates with supine bed exercises with assist as needed. Pt able to demonstrate isometric strength and assists with movement exercises. Fatigues quickly. Unsafe/pt unable to attempt further mobility at this timeContinue PT to progress endurance and strength to improve ability to assist with functional mobility.    Follow Up Recommendations        Equipment Recommendations       Recommendations for Other Services       Precautions / Restrictions Precautions Precautions: Fall Restrictions Weight Bearing Restrictions: No    Mobility  Bed Mobility               General bed mobility comments: Not tested due to feeling nauseated and level of lethargy is unsafe  Transfers                    Ambulation/Gait                 Stairs             Wheelchair Mobility    Modified Rankin (Stroke Patients Only)       Balance                                            Cognition Arousal/Alertness: Lethargic Behavior During Therapy: WFL for tasks assessed/performed Overall Cognitive Status: Within Functional Limits for tasks assessed                                 General Comments: Pt reports feeling nauseated/unwell, but willing to try PT      Exercises General Exercises - Lower Extremity Ankle Circles/Pumps: AROM;Both;10 reps;Supine Quad Sets:  Strengthening;Both;10 reps;Supine Gluteal Sets: Strengthening;Both;10 reps;Supine Short Arc Quad: AAROM;Both;10 reps;Supine Heel Slides: AAROM;Both;10 reps;Supine Hip ABduction/ADduction: AAROM;Both;10 reps;Supine    General Comments        Pertinent Vitals/Pain Pain Assessment: No/denies pain    Home Living                      Prior Function            PT Goals (current goals can now be found in the care plan section) Progress towards PT goals: Progressing toward goals(slowly)    Frequency    Min 2X/week      PT Plan Current plan remains appropriate    Co-evaluation              AM-PAC PT "6 Clicks" Daily Activity  Outcome Measure  Difficulty turning over in bed (including adjusting bedclothes, sheets and blankets)?: Unable Difficulty moving from lying on back to sitting on the side of the bed? : Unable Difficulty sitting down on and standing up from a chair with arms (  e.g., wheelchair, bedside commode, etc,.)?: Unable Help needed moving to and from a bed to chair (including a wheelchair)?: Total Help needed walking in hospital room?: Total Help needed climbing 3-5 steps with a railing? : Total 6 Click Score: 6    End of Session Equipment Utilized During Treatment: Oxygen Activity Tolerance: Patient limited by lethargy Patient left: in bed;with call bell/phone within reach;with bed alarm set   PT Visit Diagnosis: Unsteadiness on feet (R26.81);Muscle weakness (generalized) (M62.81);Difficulty in walking, not elsewhere classified (R26.2)     Time: 6761-9509 PT Time Calculation (min) (ACUTE ONLY): 17 min  Charges:  $Therapeutic Exercise: 8-22 mins                    G Codes:        Larae Grooms, PTA 06/11/2017, 2:12 PM

## 2017-06-11 NOTE — Progress Notes (Signed)
Patient's urostomy bag changed due to leaking, patient tolerated well. Urostomy bag intact without leaking present.

## 2017-06-11 NOTE — Progress Notes (Signed)
[facility]  ROUNDING NOTE   Subjective:   Daughter at bedside  UOP 1800 - furosemide 80mg  IV x one yesterday.   Creatinine 1.49  Objective:  Vital signs in last 24 hours:  Temp:  [97.7 F (36.5 C)-98.6 F (37 C)] 97.7 F (36.5 C) (05/18 0751) Pulse Rate:  [76-81] 78 (05/18 0751) Resp:  [14-24] 14 (05/18 0751) BP: (140-145)/(50-52) 145/52 (05/18 0751) SpO2:  [99 %-100 %] 99 % (05/18 0751) Weight:  [110.4 kg (243 lb 4.8 oz)] 110.4 kg (243 lb 4.8 oz) (05/18 0507)  Weight change: -8.44 kg (-18 lb 9.7 oz) Filed Weights   06/09/17 2032 06/10/17 0451 06/11/17 0507  Weight: 110.5 kg (243 lb 9.6 oz) 110.4 kg (243 lb 4.8 oz) 110.4 kg (243 lb 4.8 oz)    Intake/Output: I/O last 3 completed shifts: In: 16 [P.O.:600; I.V.:10] Out: 9381 [Urine:2400; Other:3025]   Intake/Output this shift:  Total I/O In: 240 [P.O.:240] Out: 200 [Urine:200]  Physical Exam: General: Laying in bed  Head: Normocephalic, atraumatic.   Eyes: Anicteric  Neck: Supple, trachea midline  Lungs:  Scattered rhonchi and rales, [place] 02 5L  Heart: regular  Abdomen:  Soft, nontender, bowel sounds present, urostomy in place  Extremities: 2+ peripheral edema in all four extremities  Neurologic: Alert and oriented  Skin: No lesions  Access: Left IJ temporary dialysis catheter 5/7 Dr. Jefferson Fuel    Basic Metabolic Panel: Recent Labs  Lab 06/05/17 1749 06/05/17 2218 06/06/17 0406 06/06/17 1727 06/07/17 0347 06/08/17 0422 06/09/17 0525 06/10/17 0600 06/11/17 0650  NA 136 136 137  --  138 138 139 140 142  K 4.4 4.3 4.2  --  3.7 3.9 4.0 4.2 4.5  CL 105 106 105  --  106 106 105 107 107  CO2 27 27 28   --  29 29 29 30 31   GLUCOSE 85 78 74  --  78 92 88 101* 98  BUN 11 11 9   --  7 10 16  21* 22*  CREATININE 1.06 0.96 1.09  --  1.03 1.50* 1.66* 1.54* 1.49*  CALCIUM 8.0* 7.8* 7.9*  --  7.2* 7.4* 7.9* 7.9* 8.1*  MG 1.8 1.7 1.6*  --  1.7  --  2.2  --   --   PHOS 2.3* 2.2* 2.3* 2.1* 2.0*  2.0*  --   3.1  --   --     Liver Function Tests: Recent Labs  Lab 06/04/17 2140  06/05/17 1451 06/05/17 1749 06/05/17 2218 06/06/17 0406 06/07/17 0347  AST 46*  --   --   --   --   --   --   ALT 14*  --   --   --   --   --   --   ALKPHOS 113  --   --   --   --   --   --   BILITOT 1.6*  --   --   --   --   --   --   PROT 5.1*  --   --   --   --   --   --   ALBUMIN 2.1*  2.2*   < > 2.1* 2.1* 2.1* 2.1* 2.0*   < > = values in this interval not displayed.   No results for input(s): LIPASE, AMYLASE in the last 168 hours. Recent Labs  Lab 06/04/17 2140  AMMONIA 50*    CBC: Recent Labs  Lab 06/05/17 1451 06/08/17 0422 06/10/17 0600 06/11/17 0175  WBC 3.7* 3.3* 3.2* 3.3*  NEUTROABS 2.5  --   --   --   HGB 8.7* 8.6* 8.1* 8.0*  HCT 26.2* 25.3* 24.3* 24.0*  MCV 93.7 93.1 93.4 94.2  PLT 73* 84* 107* 112*    Cardiac Enzymes: Recent Labs  Lab 06/04/17 2140  TROPONINI 0.03*    BNP: Invalid input(s): POCBNP  CBG: Recent Labs  Lab 06/10/17 0748 06/10/17 1149 06/10/17 1615 06/10/17 2109 06/11/17 0747  GLUCAP 79 131* 127* 127* 3    Microbiology: Results for orders placed or performed during the hospital encounter of 05/30/17  Culture, blood (routine x 2)     Status: None   Collection Time: 05/31/17 12:05 AM  Result Value Ref Range Status   Specimen Description BLOOD RIGHT HAND  Final   Special Requests   Final    BOTTLES DRAWN AEROBIC AND ANAEROBIC Blood Culture adequate volume   Culture   Final    NO GROWTH 5 DAYS Performed at [facility], [address] [place], [place] [address]    Report Status 06/05/2017 FINAL  Final  Urine culture     Status: Abnormal   Collection Time: 05/31/17 12:05 AM  Result Value Ref Range Status   Specimen Description   Final    URINE, RANDOM Performed at [facility], [address] [place], [place] [address]    Special Requests   Final    NONE Performed at [facility], [address]  [place], [place] [address]    Culture MULTIPLE SPECIES PRESENT, SUGGEST RECOLLECTION (A)  Final   Report Status 06/01/2017 FINAL  Final  Culture, blood (routine x 2)     Status: None   Collection Time: 05/31/17 12:08 AM  Result Value Ref Range Status   Specimen Description BLOOD LEFT AC  Final   Special Requests   Final    BOTTLES DRAWN AEROBIC AND ANAEROBIC Blood Culture adequate volume   Culture   Final    NO GROWTH 5 DAYS Performed at [facility], [address] [place], [place] [address]    Report Status 06/05/2017 FINAL  Final  MRSA PCR Screening     Status: None   Collection Time: 05/31/17  9:47 AM  Result Value Ref Range Status   MRSA by PCR NEGATIVE NEGATIVE Final    Comment:        The GeneXpert MRSA Assay (FDA approved for NASAL specimens only), is one component of a comprehensive MRSA colonization surveillance program. It is not intended to diagnose MRSA infection nor to guide or monitor treatment for MRSA infections. Performed at [facility], [address] [place], [place] [address]   Urine Culture     Status: Abnormal   Collection Time: 06/02/17  5:27 PM  Result Value Ref Range Status   Specimen Description   Final    URINE, RANDOM Performed at [facility], [address] [place], [place] [address]    Special Requests   Final    NONE Performed at [facility], [address] [place], [place] [address]    Culture (A)  Final    <10,000 COLONIES/mL INSIGNIFICANT GROWTH Performed at [facility] [address], [place], [place] [address]    Report Status 06/03/2017 FINAL  Final    Coagulation Studies: No results for input(s): LABPROT, INR in the last 72 hours.  Urinalysis: No results for input(s): COLORURINE, LABSPEC, PHURINE, GLUCOSEU, HGBUR, BILIRUBINUR, KETONESUR, PROTEINUR, UROBILINOGEN, NITRITE, LEUKOCYTESUR in  the last 72 hours.  Invalid input(s): APPERANCEUR    Imaging: No results  found.   Medications:    . docusate sodium  100 mg Oral BID  . feeding supplement (ENSURE ENLIVE)  237 mL Oral TID BM  . ferrous sulfate  325 mg Oral Q breakfast  . insulin aspart  0-5 Units Subcutaneous QHS  . insulin aspart  0-9 Units Subcutaneous TID WC  . magnesium oxide  400 mg Oral Daily  . multivitamin with minerals  1 tablet Oral Daily  . nystatin   Topical TID  . pantoprazole  40 mg Oral BID  . simvastatin  20 mg Oral QHS  . sodium chloride flush  10-40 mL Intracatheter Q12H  . umeclidinium-vilanterol  1 puff Inhalation Daily  . vitamin B-12  250 mcg Oral Daily   acetaminophen **OR** [DISCONTINUED] acetaminophen, albuterol, guaiFENesin-dextromethorphan, morphine injection, [DISCONTINUED] ondansetron **OR** ondansetron (ZOFRAN) IV, sodium chloride, sodium chloride flush  Assessment/ Plan:  Mr. TALLEY KREISER is a 84 y.o. white male with atrial fibrillation, congestive heart failure (no recent echo), COPD, hepatic cirrhosis, diabetes mellitus type II, gastric varices, history of bladder cancer status post ileal conduit, hypertension, hyperlipidemia who was admitted to [facility] on 05/30/2017 for GI bleed and acute renal failure. Placed on CRRT from 5/8 to 5/9, then on 5/9 had intermitted hemodialysis treatment, restarted on CRRT  5/10 to 5/13. Intermittent hemodialysis on 5/13, 5/14 and 5/16  1.  Acute renal failure with hyperkalemia on chronic kidney disease stage III baseline creatinine of 1.8 GFR of 38 on 04/19/17.  Acute renal failure secondary to ATN. Oliguric urine output. Responding to IV diuretics.  Chronic kidney disease secondary to obstructive uropathy, hypertension, and diabetes.  - Monitor daily for dialysis needs. Remove temp HD catheter.  - IV furosemide 80mg  IV x 1 today  2.  Anemia of chronic kidney disease with thrombocytopenia: hemoglobin 8. GI bleed on amdisssion. Endoscopy on 5/8 by Dr. Vicente Males finding gastric varices  - Iron supplements - pantoprazole  3.  Hypertension with atrial fibrillation: heart rate and blood pressure at goal.   4. Acute respiratory failure: requiring O2. With pneumonia, right pleural effusion and pulmonary edema.  - appreciate Pulmonary input - Completed 7 days of Unasyn.    LOS: 11 Linkin Vizzini 5/18/201911:26 AM

## 2017-06-12 LAB — RENAL FUNCTION PANEL
Albumin: 2.2 g/dL — ABNORMAL LOW (ref 3.5–5.0)
Anion gap: 3 — ABNORMAL LOW (ref 5–15)
BUN: 26 mg/dL — ABNORMAL HIGH (ref 6–20)
CHLORIDE: 107 mmol/L (ref 101–111)
CO2: 34 mmol/L — ABNORMAL HIGH (ref 22–32)
CREATININE: 1.23 mg/dL (ref 0.61–1.24)
Calcium: 8.3 mg/dL — ABNORMAL LOW (ref 8.9–10.3)
GFR, EST NON AFRICAN AMERICAN: 53 mL/min — AB (ref >60)
Glucose, Bld: 106 mg/dL — ABNORMAL HIGH (ref 65–99)
Phosphorus: 2.4 mg/dL — ABNORMAL LOW (ref 2.5–4.6)
Potassium: 4.5 mmol/L (ref 3.5–5.1)
Sodium: 144 mmol/L (ref 135–145)

## 2017-06-12 LAB — GLUCOSE, CAPILLARY
Glucose-Capillary: 92 mg/dL (ref 65–99)
Glucose-Capillary: 110 mg/dL — ABNORMAL HIGH (ref 65–99)
Glucose-Capillary: 94 mg/dL (ref 65–99)
Glucose-Capillary: 132 mg/dL — ABNORMAL HIGH (ref 65–99)

## 2017-06-12 MED ORDER — FUROSEMIDE 40 MG PO TABS
40.0000 mg | ORAL_TABLET | Freq: Two times a day (BID) | ORAL | Status: DC
Start: 2017-06-12 — End: 2017-06-13
  Administered 2017-06-12 – 2017-06-13 (×2): 40 mg via ORAL
  Filled 2017-06-12 (×2): qty 1

## 2017-06-12 NOTE — Progress Notes (Signed)
Patient has rested quietly this shift. Limited mobility. Activity encouraged. Oxygen weaned to 3L nasal canula. No complaints. Family at bedside most of the day. Will continue to monitor.

## 2017-06-12 NOTE — Progress Notes (Signed)
[facility]  ROUNDING NOTE   Subjective:   HD catheter removed.   UOP 1540- furosemide 80mg  IV yesterday.   Creatinine 1.23 (1.49)  Objective:  Vital signs in last 24 hours:  Temp:  [98.3 F (36.8 C)-98.8 F (37.1 C)] 98.6 F (37 C) (05/19 0742) Pulse Rate:  [78-85] 80 (05/19 0742) Resp:  [14-18] 18 (05/19 0742) BP: (132-146)/(45-52) 146/52 (05/19 0742) SpO2:  [96 %-100 %] 99 % (05/19 0742) Weight:  [111.4 kg (245 lb 9.6 oz)] 111.4 kg (245 lb 9.6 oz) (05/19 0447)  Weight change: 1.043 kg (2 lb 4.8 oz) Filed Weights   06/10/17 0451 06/11/17 0507 06/12/17 0447  Weight: 110.4 kg (243 lb 4.8 oz) 110.4 kg (243 lb 4.8 oz) 111.4 kg (245 lb 9.6 oz)    Intake/Output: I/O last 3 completed shifts: In: 240 [P.O.:240] Out: 2040 [Urine:2040]   Intake/Output this shift:  Total I/O In: 240 [P.O.:240] Out: 150 [Urine:150]  Physical Exam: General: Laying in bed  Head: Normocephalic, atraumatic.   Eyes: Anicteric  Neck: Supple, trachea midline  Lungs:  Right rhonchi, [place] 02 5L  Heart: regular  Abdomen:  Soft, nontender, bowel sounds present, urostomy in place  Extremities: 1+ peripheral edema in all four extremities  Neurologic: Alert and oriented  Skin: No lesions       Basic Metabolic Panel: Recent Labs  Lab 06/05/17 1749 06/05/17 2218 06/06/17 0406 06/06/17 1727 06/07/17 0347 06/08/17 0422 06/09/17 0525 06/10/17 0600 06/11/17 0650 06/12/17 0636  NA 136 136 137  --  138 138 139 140 142 144  K 4.4 4.3 4.2  --  3.7 3.9 4.0 4.2 4.5 4.5  CL 105 106 105  --  106 106 105 107 107 107  CO2 27 27 28   --  29 29 29 30 31  34*  GLUCOSE 85 78 74  --  78 92 88 101* 98 106*  BUN 11 11 9   --  7 10 16  21* 22* 26*  CREATININE 1.06 0.96 1.09  --  1.03 1.50* 1.66* 1.54* 1.49* 1.23  CALCIUM 8.0* 7.8* 7.9*  --  7.2* 7.4* 7.9* 7.9* 8.1* 8.3*  MG 1.8 1.7 1.6*  --  1.7  --  2.2  --   --   --   PHOS 2.3* 2.2* 2.3* 2.1* 2.0*  2.0*  --  3.1  --   --  2.4*    Liver  Function Tests: Recent Labs  Lab 06/05/17 1749 06/05/17 2218 06/06/17 0406 06/07/17 0347 06/12/17 0636  ALBUMIN 2.1* 2.1* 2.1* 2.0* 2.2*   No results for input(s): LIPASE, AMYLASE in the last 168 hours. No results for input(s): AMMONIA in the last 168 hours.  CBC: Recent Labs  Lab 06/05/17 1451 06/08/17 0422 06/10/17 0600 06/11/17 0650  WBC 3.7* 3.3* 3.2* 3.3*  NEUTROABS 2.5  --   --   --   HGB 8.7* 8.6* 8.1* 8.0*  HCT 26.2* 25.3* 24.3* 24.0*  MCV 93.7 93.1 93.4 94.2  PLT 73* 84* 107* 112*    Cardiac Enzymes: No results for input(s): CKTOTAL, CKMB, CKMBINDEX, TROPONINI in the last 168 hours.  BNP: Invalid input(s): POCBNP  CBG: Recent Labs  Lab 06/11/17 0747 06/11/17 1139 06/11/17 1646 06/11/17 2117 06/12/17 0739  GLUCAP 85 124* 95 170* 92    Microbiology: Results for orders placed or performed during the hospital encounter of 05/30/17  Culture, blood (routine x 2)     Status: None   Collection Time: 05/31/17 12:05 AM  Result  Value Ref Range Status   Specimen Description BLOOD RIGHT HAND  Final   Special Requests   Final    BOTTLES DRAWN AEROBIC AND ANAEROBIC Blood Culture adequate volume   Culture   Final    NO GROWTH 5 DAYS Performed at [facility], [address] [place], [place] [address]    Report Status 06/05/2017 FINAL  Final  Urine culture     Status: Abnormal   Collection Time: 05/31/17 12:05 AM  Result Value Ref Range Status   Specimen Description   Final    URINE, RANDOM Performed at [facility], [address] [place], [place] [address]    Special Requests   Final    NONE Performed at [facility], [address] [place], [place] [address]    Culture MULTIPLE SPECIES PRESENT, SUGGEST RECOLLECTION (A)  Final   Report Status 06/01/2017 FINAL  Final  Culture, blood (routine x 2)     Status: None   Collection Time: 05/31/17 12:08 AM  Result Value Ref Range Status   Specimen Description BLOOD LEFT [facility]   Final   Special Requests   Final    BOTTLES DRAWN AEROBIC AND ANAEROBIC Blood Culture adequate volume   Culture   Final    NO GROWTH 5 DAYS Performed at [facility], [address] [place], [place] [address]    Report Status 06/05/2017 FINAL  Final  MRSA PCR Screening     Status: None   Collection Time: 05/31/17  9:47 AM  Result Value Ref Range Status   MRSA by PCR NEGATIVE NEGATIVE Final    Comment:        The GeneXpert MRSA Assay (FDA approved for NASAL specimens only), is one component of a comprehensive MRSA colonization surveillance program. It is not intended to diagnose MRSA infection nor to guide or monitor treatment for MRSA infections. Performed at [facility], [address] [place], [place] [address]   Urine Culture     Status: Abnormal   Collection Time: 06/02/17  5:27 PM  Result Value Ref Range Status   Specimen Description   Final    URINE, RANDOM Performed at [facility], [address], [place], [place] [address]    Special Requests   Final    NONE Performed at [facility], [address] [place], [place] [address]    Culture (A)  Final    <10,000 COLONIES/mL INSIGNIFICANT GROWTH Performed at [facility] [address] [place], [place] [address]    Report Status 06/03/2017 FINAL  Final    Coagulation Studies: No results for input(s): LABPROT, INR in the last 72 hours.  Urinalysis: No results for input(s): COLORURINE, LABSPEC, PHURINE, GLUCOSEU, HGBUR, BILIRUBINUR, KETONESUR, PROTEINUR, UROBILINOGEN, NITRITE, LEUKOCYTESUR in the last 72 hours.  Invalid input(s): APPERANCEUR    Imaging: No results found.   Medications:    . docusate sodium  100 mg Oral BID  . feeding supplement (ENSURE ENLIVE)  237 mL Oral TID BM  . ferrous sulfate  325 mg Oral Q breakfast  . furosemide  40 mg Oral BID  . insulin aspart  0-5 Units Subcutaneous QHS  . insulin aspart  0-9 Units Subcutaneous TID WC  .  magnesium oxide  400 mg Oral Daily  . multivitamin with minerals  1 tablet Oral Daily  . nystatin   Topical TID  . pantoprazole  40 mg Oral BID  . simvastatin  20 mg Oral QHS  .  sodium chloride flush  10-40 mL Intracatheter Q12H  . umeclidinium-vilanterol  1 puff Inhalation Daily  . vitamin B-12  250 mcg Oral Daily   acetaminophen **OR** [DISCONTINUED] acetaminophen, albuterol, guaiFENesin-dextromethorphan, morphine injection, [DISCONTINUED] ondansetron **OR** ondansetron (ZOFRAN) IV, sodium chloride, sodium chloride flush  Assessment/ Plan:  Mr. DAXTIN LEIKER is a 84 y.o. white male with atrial fibrillation, congestive heart failure (no recent echo), COPD, hepatic cirrhosis, diabetes mellitus type II, gastric varices, history of bladder cancer status post ileal conduit, hypertension, hyperlipidemia who was admitted to [facility] on 05/30/2017 for GI bleed and acute renal failure. Placed on CRRT from 5/8 to 5/9, then on 5/9 had intermitted hemodialysis treatment, restarted on CRRT  5/10 to 5/13. Intermittent hemodialysis on 5/13, 5/14 and 5/16  1.  Acute renal failure with hyperkalemia on chronic kidney disease stage III baseline creatinine of 1.8 GFR of 38 on 04/19/17.  Acute renal failure secondary to ATN. Oliguric urine output. Responding to diuretics.  Chronic kidney disease secondary to obstructive uropathy, hypertension, and diabetes.  No indication for further dialysis. Temp HD catheter removed.  - Start PO furosemide 40mg  PO bid - Hold losartan  2.  Anemia of chronic kidney disease with thrombocytopenia: hemoglobin 8. GI bleed on admission Endoscopy on 5/8 by Dr. Vicente Males finding gastric varices  - Iron supplements - pantoprazole  3. Hypertension with atrial fibrillation: heart rate and blood pressure at goal. Holding home losartan and nadalol. - PO furosemide as above.    4. Acute respiratory failure: requiring O2 - baseline was 2 liters at home. With pneumonia, right pleural effusion and  pulmonary edema.  - appreciate Pulmonary input - Completed 7 days of Unasyn.    LOS: 12 [name] 5/19/201910:57 AM

## 2017-06-12 NOTE — Progress Notes (Signed)
[place] [facility] [place] NAME: [name]    MR#:  [id number]  DATE OF BIRTH:  1933/04/12  SUBJECTIVE:   She denies any shortness of breath, responded well to IV Lasix yesterday.  Still having significant pitting edema on the lower extremities bilaterally.  Quite weak and debilitated still.  REVIEW OF SYSTEMS:    Review of Systems  Constitutional: Negative for chills and fever.  HENT: Negative for congestion and tinnitus.   Eyes: Negative for blurred vision and double vision.  Respiratory: Negative for cough, shortness of breath and wheezing.   Cardiovascular: Negative for chest pain, orthopnea and PND.  Gastrointestinal: Negative for abdominal pain, diarrhea, nausea and vomiting.  Genitourinary: Negative for dysuria and hematuria.  Neurological: Positive for weakness (Generalized. ). Negative for dizziness, sensory change and focal weakness.  All other systems reviewed and are negative.   Nutrition: Dysphagia III with thin liquids Tolerating Diet: Yes Tolerating PT: Eval noted.   DRUG ALLERGIES:  No Known Allergies  VITALS:  Blood pressure (!) 146/52, pulse 80, temperature 98.6 F (37 C), temperature source Oral, resp. rate 18, height 5\' 8"  (1.727 m), weight 111.4 kg (245 lb 9.6 oz), SpO2 97 %.  PHYSICAL EXAMINATION:   Physical Exam  GENERAL:  84 y.o.-year-old patient lying in bed in no acute distress.  EYES: Pupils equal, round, reactive to light and accommodation. No scleral icterus. Extraocular muscles intact.  HEENT: Head atraumatic, normocephalic. Oropharynx and nasopharynx clear.  NECK:  Supple, no jugular venous distention. No thyroid enlargement, no tenderness.  LUNGS: Normal breath sounds bilaterally, no wheezing, bibasilar rales, No rhonchi. No use of accessory muscles of respiration.  CARDIOVASCULAR: S1, S2 normal. No murmurs, rubs, or gallops.  ABDOMEN: Soft, nontender, nondistended. Bowel sounds present. No organomegaly  or mass.  EXTREMITIES: No cyanosis, clubbing, + 2 edema b/l    NEUROLOGIC: Cranial nerves II through XII are intact. No focal Motor or sensory deficits b/l.  Globally weak. PSYCHIATRIC: The patient is alert and oriented x 3.  SKIN: No obvious rash, lesion, or ulcer.    LABORATORY PANEL:   CBC Recent Labs  Lab 06/11/17 0650  WBC 3.3*  HGB 8.0*  HCT 24.0*  PLT 112*   ------------------------------------------------------------------------------------------------------------------  Chemistries  Recent Labs  Lab 06/09/17 0525  06/12/17 0636  NA 139   < > 144  K 4.0   < > 4.5  CL 105   < > 107  CO2 29   < > 34*  GLUCOSE 88   < > 106*  BUN 16   < > 26*  CREATININE 1.66*   < > 1.23  CALCIUM 7.9*   < > 8.3*  MG 2.2  --   --    < > = values in this interval not displayed.   ------------------------------------------------------------------------------------------------------------------  Cardiac Enzymes No results for input(s): TROPONINI in the last 168 hours. ------------------------------------------------------------------------------------------------------------------  RADIOLOGY:  No results found.   ASSESSMENT AND PLAN:   84 year old male with history of PAF, chronic diastolic heart failure, hepatic cirrhosis and diabetes who presented to the emergency room due to GI bleed and acute kidney injury.  1.  Acute hypoxic respiratory failure due and fluid overload due to acute kidney injury - Much improved with CRRT and also now with IV diuresis.   - cont. o2 supplementation and much improved and stable now.  2.  Septic shock due to pneumonia: Much improved, off antibiotics, cultures have been negative. -Patient has been adequately  treated with IV antibiotics which have now been discontinued.   3.  Acute on chronic kidney disease stage III: This is due to ATN -While the patient was in ICU on vasopressors he was on CRRT but now has been weaned off of it.  He is  making good urine and currently is not dialysis dependent. -Responded well to IV Lasix yesterday and had 1200 cc of urine output.  Change to oral Lasix today and will follow response.  Creatinine stable.  No urgent need for dialysis.  4.  GI bleed with anemia of chronic disease status post EGD showing gastritis/varices - Continue PPI. Hg. Stable.   5.  COPD without signs of exacerbation - cont. Anoro-Ellipta  6  Diabetes: Continue sliding scale. BS stable.   7.  Acute on chronic diastolic heart failure: Continue dialysis for Fluid removal and pt. Is improving.   8.  Thrombocytopenia: Platelets have been low but stable - Continue monitoring  Await PT eval.    All the records are reviewed and case discussed with Care Management/Social Worker. Management plans discussed with the patient, family and they are in agreement.  CODE STATUS: DNR  DVT Prophylaxis: TED's & SCD's.   TOTAL TIME TAKING CARE OF THIS PATIENT: 30 minutes.   POSSIBLE D/C IN 1-2 DAYS, DEPENDING ON CLINICAL CONDITION.   Henreitta Leber M.D on 06/12/2017 at 1:33 PM  Between 7am to 6pm - Pager - [phone]  After 6pm go to [email or web address] - Proofreader  [facility]  Office  [phone]  CC: Primary care physician; Ria Bush, MD

## 2017-06-13 LAB — BASIC METABOLIC PANEL
Anion gap: 7 (ref 5–15)
BUN: 27 mg/dL — AB (ref 6–20)
CO2: 31 mmol/L (ref 22–32)
CREATININE: 1.03 mg/dL (ref 0.61–1.24)
Calcium: 8.4 mg/dL — ABNORMAL LOW (ref 8.9–10.3)
Chloride: 107 mmol/L (ref 101–111)
Glucose, Bld: 97 mg/dL (ref 65–99)
Potassium: 4.5 mmol/L (ref 3.5–5.1)
SODIUM: 145 mmol/L (ref 135–145)

## 2017-06-13 LAB — GLUCOSE, CAPILLARY
Glucose-Capillary: 96 mg/dL (ref 65–99)
Glucose-Capillary: 127 mg/dL — ABNORMAL HIGH (ref 65–99)
Glucose-Capillary: 141 mg/dL — ABNORMAL HIGH (ref 65–99)
Glucose-Capillary: 132 mg/dL — ABNORMAL HIGH (ref 65–99)

## 2017-06-13 LAB — MAGNESIUM: MAGNESIUM: 1.8 mg/dL (ref 1.7–2.4)

## 2017-06-13 MED ORDER — K PHOS MONO-SOD PHOS DI & MONO 155-852-130 MG PO TABS
500.0000 mg | ORAL_TABLET | ORAL | Status: AC
  Administered 2017-06-13 (×2): 500 mg via ORAL
  Filled 2017-06-13 (×2): qty 2

## 2017-06-13 MED ORDER — NADOLOL 40 MG PO TABS
40.0000 mg | ORAL_TABLET | Freq: Every day | ORAL | Status: DC
  Administered 2017-06-13 – 2017-06-16 (×2): 40 mg via ORAL
  Filled 2017-06-13 (×4): qty 1

## 2017-06-13 MED ORDER — BISACODYL 10 MG RE SUPP
10.0000 mg | Freq: Every day | RECTAL | Status: DC | PRN
  Administered 2017-06-13: 10 mg via RECTAL
  Filled 2017-06-13: qty 1

## 2017-06-13 MED ORDER — TORSEMIDE 20 MG PO TABS
20.0000 mg | ORAL_TABLET | Freq: Every day | ORAL | Status: DC
  Administered 2017-06-14 – 2017-06-15 (×2): 20 mg via ORAL
  Filled 2017-06-13 (×2): qty 1

## 2017-06-13 MED ORDER — TORSEMIDE 20 MG PO TABS: 20.0000 mg | ORAL_TABLET | Freq: Every day | ORAL | Status: DC

## 2017-06-13 NOTE — Progress Notes (Signed)
Daily Progress Note   Patient Name: [name]       Date: 06/13/2017 DOB: 1933-02-26  Age: 84 y.o. MRN#: [id number] Attending Physician: Henreitta Leber, MD Primary Care Physician: Ria Bush, MD Admit Date: 05/30/2017  Reason for Consultation/Follow-up: Establishing goals of care  Subjective: Patient awake in bed. Alert and oriented.  HD catheter has been removed and no further dialysis is needed according to nephrology.  Preparing to work with physical therapy. Daughter, [name] is at bedside.  She verbalized that the plan at this time was to pursue a SNF rehab facility after watching him work with physical therapy over the weekend.  The family discovered that he is too weak and deconditioned to return home currently, even with home health physical therapy assistance.  He reports he requires significant assistance just to get in and out of bed and is hopeful that if he goes to a facility can rebuild some strength and return home with family.  Verbalized even with equipment in the home in his current state he will require more assistance than she can provide on her own.  We reviewed his current condition, and again at this point they are interested in rehabilitation and palliative outpatient services.   Chart reviewed.  Length of Stay: 13  Current Medications: Scheduled Meds:  . docusate sodium  100 mg Oral BID  . feeding supplement (ENSURE ENLIVE)  237 mL Oral TID BM  . ferrous sulfate  325 mg Oral Q breakfast  . insulin aspart  0-5 Units Subcutaneous QHS  . insulin aspart  0-9 Units Subcutaneous TID WC  . magnesium oxide  400 mg Oral Daily  . multivitamin with minerals  1 tablet Oral Daily  . nystatin   Topical TID  . pantoprazole  40 mg Oral BID  . simvastatin  20 mg Oral QHS    . sodium chloride flush  10-40 mL Intracatheter Q12H  . torsemide  20 mg Oral Daily  . umeclidinium-vilanterol  1 puff Inhalation Daily  . vitamin B-12  250 mcg Oral Daily    Continuous Infusions:   PRN Meds: acetaminophen **OR** [DISCONTINUED] acetaminophen, albuterol, bisacodyl, guaiFENesin-dextromethorphan, morphine injection, [DISCONTINUED] ondansetron **OR** ondansetron (ZOFRAN) IV, sodium chloride, sodium chloride flush  Physical Exam  Constitutional: Vital signs are normal. He appears well-developed. He is cooperative. He  has a sickly appearance.  Cardiovascular: Normal rate, regular rhythm, normal heart sounds, intact distal pulses and normal pulses.  Pulmonary/Chest: He has decreased breath sounds in the right lower field and the left lower field.  Some shortness of breath during conversation.   Abdominal: Soft. Bowel sounds are normal.  Musculoskeletal:  Bilateral lower edema   Neurological: He is alert.  Psychiatric: He has a normal mood and affect. His speech is normal and behavior is normal. Judgment and thought content normal. Cognition and memory are normal.            Vital Signs: BP (!) 151/52 (BP Location: Left Arm)   Pulse 82   Temp 98.2 F (36.8 C) (Oral)   Resp 18   Ht 5\' 8"  (1.727 m)   Wt 106.9 kg (235 lb 11.2 oz) Comment: all extra bedding removed.   SpO2 97%   BMI 35.84 kg/m  SpO2: SpO2: 97 % O2 Device: O2 Device: Nasal Cannula O2 Flow Rate: O2 Flow Rate (L/min): 3 L/min  Intake/output summary:   Intake/Output Summary (Last 24 hours) at 06/13/2017 1357 Last data filed at 06/13/2017 1351 Gross per 24 hour  Intake 480 ml  Output 2270 ml  Net -1790 ml   LBM: Last BM Date: 06/07/17(per pt family) Baseline Weight: Weight: 97.5 kg (215 lb) Most recent weight: Weight: 106.9 kg (235 lb 11.2 oz)(all extra bedding removed. )       Palliative Assessment/Data:PPS 30%   Flowsheet Rows     Most Recent Value  Intake Tab  Referral Department   Hospitalist  Unit at Time of Referral  Med/Surg Unit  Palliative Care Primary Diagnosis  Cancer  Date Notified  05/30/17  Palliative Care Type  New Palliative care  Reason for referral  Clarify Goals of Care  Date of Admission  05/30/17  Date first seen by Palliative Care  06/06/17  # of days Palliative referral response time  7 Day(s)  # of days IP prior to Palliative referral  0  Clinical Assessment  Psychosocial & Spiritual Assessment  Palliative Care Outcomes      Patient Active Problem List   Diagnosis Date Noted  . Cirrhosis of liver with ascites ([place])   . Palliative care by specialist   . Advance care planning   . Acute kidney injury superimposed on chronic kidney disease ([place]) 05/31/2017  . Varicose veins of both legs with edema 11/26/2016  . Bilateral leg weakness 10/15/2016  . Cold intolerance 12/16/2015  . Generalized weakness 09/09/2015  . Health maintenance examination 03/12/2015  . Psoriasis 09/09/2014  . Advanced care planning/counseling discussion 02/12/2014  . Presence of urostomy ([place]) 08/02/2013  . Medicare annual wellness visit, subsequent 02/09/2013  . Leg edema 07/05/2012  . Radicular leg pain 12/07/2011  . Cirrhosis, cryptogenic ([place]) 07/06/2011  . Anti-Duffy a antibodies present 07/06/2011  . Duodenal nodule 07/06/2011  . Gastric varices 06/30/2011  . Gastric and duodenal angiodysplasia 06/28/2011  . Diastolic CHF ([place]) 32/35/5732  . Neoplasm of uncertain behavior of skin 07/30/2010  . COPD, severe ([place]) 07/30/2010  . Atrial fibrillation ([place]) 06/23/2010  . Pancytopenia ([place]) 11/28/2007  . Iron deficiency anemia 04/03/2007  . INGUINAL HERNIA, LEFT 04/03/2007  . HEMORRHOIDS, INTERNAL 10/26/2006  . Controlled diabetes mellitus type 2 with complications ([place]) 20/25/4270  . HLD (hyperlipidemia) 07/14/2006  . Essential hypertension 07/14/2006  . AORTIC INSUFFICIENCY 07/14/2006    Palliative Care Assessment & Plan   Patient Profile: 84 y.o.  male  with past medical history  of cryptongenic cirrhosis (w/ history of hypoalbuminemia), DM2, iron deficiency anemia, pancytopenia (presumed d/t cirrhosis), bladder cancer s/p cystectomy (very remote), HTN, severe COPD, Gastric and duodenal hyperdysplasia, a. Fib, CHF, CKD III, admitted on 05/30/2017 weakness, bradycardia, hypotension. Workup found him to be in acute on chronic renal failure- ?from dehydration r/t hemorrhagic gastritis discovered on endoscopy as well as septic shock d/t pneumonia. Renal u/s showed ascites. He has required CRRT- is off now and attempts are being made at intermittent dialysis. He is currently on HF nasal cannula with FIO2 at 35%. Chest xray shows ongoing pulmonary edema. Palliative medicine consulted for "GOC- Progressive decline despite treatment".    Recommendations/Plan:  DNR/DNI at patient's request  Continue to treat the treatable  According to family request they are wishing to seek placement at skilled rehab facility due to patient's generalized weakness and deconditioning.  Daughter [name], verbalized that she would be unable to care for him in the home setting in his current state.  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  We discussed patient going home with additional services such as home health PT, OT, aide, to assist with his needs.  At this time both patient and daughter will prefer an inpatient option with hopes of returning home.  They are aware that patient must participate in rehabilitation in order to attempt to recondition and to remain in facility for her rehabilitation.  They verbalized understanding.  CM referral for outpatient palliative services. Family is not interested in hospice services at this time.    Palliative Medicine team will continue to support patient, family, and medical team during hospitalization as needed.   Goals of Care and Additional  Recommendations:  Limitations on Scope of Treatment: Full Scope Treatment  Code Status:    Code Status Orders  (From admission, onward)        Start     Ordered   06/07/17 1603  Do not attempt resuscitation (DNR)  Continuous    Question Answer Comment  In the event of cardiac or respiratory ARREST Do not call a "code blue"   In the event of cardiac or respiratory ARREST Do not perform Intubation, CPR, defibrillation or ACLS   In the event of cardiac or respiratory ARREST Use medication by any route, position, wound care, and other measures to relive pain and suffering. May use oxygen, suction and manual treatment of airway obstruction as needed for comfort.      06/07/17 1602    Code Status History    Date Active Date Inactive Code Status Order ID Comments User Context   05/31/2017 0329 06/07/2017 1602 Full Code [id number]  Harrie Foreman, MD ED     Prognosis:   Unable to determine-guarded to poor in the setting of chronic kidney disease, acute on chronic anemia, pulmonary edema, acute hypoxemic respiratory failure, decreased immobility, COPD, CRRT, cirrhosis, diabetes, CHF, and generalized weakness.  Discharge Planning:  [place] for rehab with Palliative care service follow-up versus home with HHPT and other required services.   Care plan was discussed with patient, family,and bedside RN.  Thank you for allowing the Palliative Medicine Team to assist in the care of this patient.   Total Time 35 min.  Prolonged Time Billed  NO      Greater than 50%  of this time was spent counseling and coordinating care related to the above assessment and plan.  Alda Lea, NP-BC Palliative  Medicine Team  Phone: [phone] Fax: [phone]  Please contact Palliative Medicine Team phone at [phone] for questions and concerns.

## 2017-06-13 NOTE — Progress Notes (Signed)
Given report to [facility], RN  patient and daughter is also inform about the transfer. Patient is going to room 159.

## 2017-06-13 NOTE — Clinical Social Work Note (Addendum)
CSW spoke with patient's daughter Salvatore Marvel, [phone] and they would like SNF placement now.  CSW explained the process and what to expect at SNF, patient's daughter is agreeable to have CSW begin bed search in [place].  CSW informed her that insurance will have to approve patient for SNF, if he is not approved the other options would be to pay privately for SNF or go home with home health.  Patient's daughter expressed understanding.  CSW has faxed required information to SNFs awaiting bed offers.  Jones Broom. [place], MSW, [place]  06/13/2017 1:23 PM

## 2017-06-13 NOTE — Progress Notes (Signed)
Nutrition Follow Up Note   DOCUMENTATION CODES:   Obesity unspecified  INTERVENTION:   Bowel regimen per MD  Ensure Enlive po BID, each supplement provides 350 kcal and 20 grams of protein  Rena-vite daily   Magic cup TID with meals, each supplement provides 290 kcal and 9 grams of protein  MVI daily   Assist with meals   NUTRITION DIAGNOSIS:   Inadequate oral intake related to acute illness as evidenced by meal completion < 50%.  GOAL:   Patient will meet greater than or equal to 90% of their needs  -progressing   MONITOR:   PO intake, Supplement acceptance, Labs, Weight trends, I & O's  ASSESSMENT:   84 y.o. white male with atrial fibrillation, congestive heart failure (no recent echo), COPD, hepatic cirrhosis, diabetes mellitus type II, cirrhosis, gastric and duodenal angiopathy, gastric varices, history of bladder cancer status post ileal conduit, hypertension, hyperlipidemia who was admitted to [facility] on 05/30/2017 for GI bleed and acute renal failure. Pt found to have PNA. Placed on CRRT from 5/8 to 5/9, then on 5/9 had intermitted hemodialysis treatment, restarted on CRRT  5/10 to 5/13. Intermittent hemodialysis on 5/13.   Pt with much improved appetite and oral intake; eating 100% of meals but refusing most supplements. Per chart, pt is back to his admit weight but is still ~20lbs above his UBW. Pt is noted to have severe edema in his BLE. HD catheter removed. Continue supplements and MVI. Pt with mild refeeding; phosphorus being supplemented. Type 2 BM noted 5/14; bowel regimen per MD.   Medications reviewed and include: colace, ferrous sulfate, insulin, Mg oxide, MVI protonix, KPhos, torsemide, B12  Labs reviewed: K 4.5 wnl  P 2.4(L), Mg 1.8wnl- 5/19 BNP 668(H)- 5/15 Wbc- 3.3(L), Hgb 8.6(L), Hct 25.3(L)- 5/18  Diet Order:   Diet Order           DIET DYS 3 Room service appropriate? Yes with Assist; Fluid consistency: Thin  Diet effective now         EDUCATION  NEEDS:   Education needs have been addressed  Skin:  Skin Assessment: Reviewed RN Assessment  Last BM:  5/14- TYPE 2  Height:   Ht Readings from Last 1 Encounters:  06/01/17 5\' 8"  (1.727 m)    Weight:   Wt Readings from Last 1 Encounters:  06/13/17 235 lb 11.2 oz (106.9 kg)    Ideal Body Weight:  70 kg  BMI:  Body mass index is 35.84 kg/m.  Estimated Nutritional Needs:   Kcal:  2000-2300kcal/day   Protein:  95-115g/day   Fluid:  per MD  Koleen Distance MS, RD, LDN Pager #- [phone] Office#- [phone] After Hours Pager: [phone]

## 2017-06-13 NOTE — Care Management Important Message (Signed)
Important Message  Patient Details  Name: [name] MRN: [id number] Date of Birth: 12-May-1933   Medicare Important Message Given:  Yes    [name] A Adisynn Suleiman 06/13/2017, 12:27 PM

## 2017-06-13 NOTE — Progress Notes (Signed)
Pharmacy Electrolyte Monitoring Consult:  Pharmacy consulted to assist in monitoring and replacing electrolytes in this 84 y.o. male admitted on 05/30/2017 with Weakness  Labs:  Sodium (mmol/L)  Date Value  06/13/2017 145  06/15/2011 141   Potassium (mmol/L)  Date Value  06/13/2017 4.5  06/15/2011 3.7   Magnesium (mg/dL)  Date Value  06/13/2017 1.8  06/14/2011 1.7 (L)   Phosphorus (mg/dL)  Date Value  06/12/2017 2.4 (L)   Calcium (mg/dL)  Date Value  06/13/2017 8.4 (L)   Calcium, Total (mg/dL)  Date Value  06/15/2011 8.3 (L)   Albumin (g/dL)  Date Value  06/12/2017 2.2 (L)  06/11/2011 4.1    Assessment/Plan: 5/18 Electrolytes WNL. No supplementation needed. Patient on Mag-Ox 400 mg PO daily. Currently on intermittent Hemodialysis-monitoring daily per Nephrology Will recheck electrolytes on Monday and continue to replace as needed.  5/20 phosphorous 2.4. Will replace with KPhos 2 tabs q4hx2 and re-check labs in am. Temp HD catheter removed (no longer required). Lasix 40mg  po bid started 5/19.  Vallery Sa, PhamD Clinical Pharmacist 06/13/2017 7:33 AM

## 2017-06-13 NOTE — Progress Notes (Signed)
Physical Therapy Treatment Patient Details Name: [name] MRN: [id number] DOB: 1933/05/09 Today's Date: 06/13/2017    History of Present Illness Pt admitted for AKI with complaints of weakness. According to pt, after vein surgery, pt drastically became weak and slid out of bed at home. Hospital stay complicated by CRRT and now temp IJ placed for HD. Recent admission for GI bleed and ARF. History includes CHF, Afib, COPD, DM, bladder ca, HTN.     PT Comments    Pt presents with deficits in strength, transfers, mobility, gait, balance, and activity tolerance.  Pt required mod A with bed mobility tasks with cues for sequencing with SpO2 on 3LO2/min dropping from baseline of 96% to 92% once in sitting at the EOB.  Multiple attempts made to stand from various bed heights with pt unable to clear the surface of the bed.  Pt overall very weak functionally but motivated to participate with therapy and to return to his PLOF.  SpO2 measured frequently throughout session and never dropped below 92%.  Pt will benefit from PT services in a SNF setting upon discharge to safely address above deficits for decreased caregiver assistance and eventual return to PLOF.     Follow Up Recommendations  SNF;Supervision for mobility/OOB     Equipment Recommendations       Recommendations for Other Services       Precautions / Restrictions Precautions Precautions: Fall Restrictions Weight Bearing Restrictions: No    Mobility  Bed Mobility Overal bed mobility: Needs Assistance Bed Mobility: Supine to Sit;Sit to Supine     Supine to sit: Mod assist Sit to supine: Mod assist   General bed mobility comments: Mod A for BLEs in and out of bed and for full upright sitting during sup to sit.  Transfers                 General transfer comment: Pt unable to clear surface of bed during sit to/from stand transfer attempts from various height surfaces  Ambulation/Gait             General Gait  Details: Unable   Stairs             Wheelchair Mobility    Modified Rankin (Stroke Patients Only)       Balance Overall balance assessment: Needs assistance Sitting-balance support: Feet supported Sitting balance-Leahy Scale: Fair                                      Cognition Arousal/Alertness: Awake/alert Behavior During Therapy: WFL for tasks assessed/performed Overall Cognitive Status: Within Functional Limits for tasks assessed                                        Exercises Total Joint Exercises Ankle Circles/Pumps: AROM;Both;10 reps Quad Sets: Strengthening;Both;10 reps Gluteal Sets: Strengthening;Both;10 reps Heel Slides: AAROM;Both;5 reps Hip ABduction/ADduction: AROM;Both;10 reps Straight Leg Raises: AAROM;5 reps;Both Long Arc Quad: AROM;Both;10 reps Knee Flexion: AROM;Both;10 reps Other Exercises Other Exercises: HEP education and review for BLE APs, QS, and GS x 10 each 5-6x/day Other Exercises: Bilateral supine leg press with manual resistance x 10     General Comments        Pertinent Vitals/Pain Pain Assessment: 0-10 Pain Score: 3  Pain Location: B feet Pain Descriptors / Indicators: Sore  Pain Intervention(s): Premedicated before session;Monitored during session    Home Living                      Prior Function            PT Goals (current goals can now be found in the care plan section)      Frequency    Min 2X/week      PT Plan Current plan remains appropriate    Co-evaluation              AM-PAC PT "6 Clicks" Daily Activity  Outcome Measure                   End of Session Equipment Utilized During Treatment: Gait belt;Oxygen Activity Tolerance: Patient tolerated treatment well Patient left: in bed;with call bell/phone within reach;with bed alarm set;with family/visitor present Nurse Communication: Mobility status PT Visit Diagnosis: Unsteadiness on feet  (R26.81);Muscle weakness (generalized) (M62.81);Difficulty in walking, not elsewhere classified (R26.2)     Time: 1856-3149 PT Time Calculation (min) (ACUTE ONLY): 24 min  Charges:  $Therapeutic Exercise: 8-22 mins $Therapeutic Activity: 8-22 mins                    G Codes:       DRoyetta Asal PT, DPT 06/13/17, 2:14 PM

## 2017-06-13 NOTE — Progress Notes (Signed)
[name] at [place] NAME: [name]    MR#:  [id number]  DATE OF BIRTH:  23-Mar-1933  SUBJECTIVE:   Creatinine has normalized.  Responding well to oral diuretics.  No shortness of breath or any other complaints.  Remains quite deconditioned and daughter is reconsidering discharged to home versus skilled nursing facility.  REVIEW OF SYSTEMS:    Review of Systems  Constitutional: Negative for chills and fever.  HENT: Negative for congestion and tinnitus.   Eyes: Negative for blurred vision and double vision.  Respiratory: Negative for cough, shortness of breath and wheezing.   Cardiovascular: Negative for chest pain, orthopnea and PND.  Gastrointestinal: Negative for abdominal pain, diarrhea, nausea and vomiting.  Genitourinary: Negative for dysuria and hematuria.  Neurological: Positive for weakness (Generalized. ). Negative for dizziness, sensory change and focal weakness.  All other systems reviewed and are negative.   Nutrition: Dysphagia III with thin liquids Tolerating Diet: Yes Tolerating PT: Eval noted.   DRUG ALLERGIES:  No Known Allergies  VITALS:  Blood pressure (!) 151/52, pulse 85, temperature 98.2 F (36.8 C), temperature source Oral, resp. rate 18, height 5\' 8"  (1.727 m), weight 106.9 kg (235 lb 11.2 oz), SpO2 96 %.  PHYSICAL EXAMINATION:   Physical Exam  GENERAL:  84 y.o.-year-old obese patient lying in bed in no acute distress.  EYES: Pupils equal, round, reactive to light and accommodation. No scleral icterus. Extraocular muscles intact.  HEENT: Head atraumatic, normocephalic. Oropharynx and nasopharynx clear.  NECK:  Supple, no jugular venous distention. No thyroid enlargement, no tenderness.  LUNGS: Normal breath sounds bilaterally, no wheezing, bibasilar rales, No rhonchi. No use of accessory muscles of respiration.  CARDIOVASCULAR: S1, S2 normal. No murmurs, rubs, or gallops.  ABDOMEN: Soft, nontender,  nondistended. Bowel sounds present. No organomegaly or mass.  EXTREMITIES: No cyanosis, clubbing, + 2 edema b/l    NEUROLOGIC: Cranial nerves II through XII are intact. No focal Motor or sensory deficits b/l.  Globally weak. PSYCHIATRIC: The patient is alert and oriented x 3.  SKIN: No obvious rash, lesion, or ulcer.    LABORATORY PANEL:   CBC Recent Labs  Lab 06/11/17 0650  WBC 3.3*  HGB 8.0*  HCT 24.0*  PLT 112*   ------------------------------------------------------------------------------------------------------------------  Chemistries  Recent Labs  Lab 06/13/17 0604  NA 145  K 4.5  CL 107  CO2 31  GLUCOSE 97  BUN 27*  CREATININE 1.03  CALCIUM 8.4*  MG 1.8   ------------------------------------------------------------------------------------------------------------------  Cardiac Enzymes No results for input(s): TROPONINI in the last 168 hours. ------------------------------------------------------------------------------------------------------------------  RADIOLOGY:  No results found.   ASSESSMENT AND PLAN:   84 year old male with history of PAF, chronic diastolic heart failure, hepatic cirrhosis and diabetes who presented to the emergency room due to GI bleed and acute kidney injury.  1.  Acute hypoxic respiratory failure due and fluid overload due to acute kidney injury - Much improved with CRRT and also now with IV diuresis.   - cont. o2 supplementation and much improved and stable now.  2.  Septic shock due to pneumonia: Much improved, off antibiotics, cultures have been negative. -Patient has been adequately treated with IV antibiotics which have now been discontinued.   3.  Acute Renal failure: This is due to ATN -While the patient was in ICU on vasopressors he was on CRRT but now has been weaned off of it.  He is making good urine and currently is not dialysis dependent. -Now on  oral torsemide and diuresing well with it.  Creatinine has  normalized.  No acute need for dialysis and therefore nephrology has signed off.  4.  GI bleed with anemia of chronic disease status post EGD showing gastritis/varices - Continue PPI. Hg. Stable.   5.  COPD without signs of exacerbation - cont. Anoro-Ellipta  6  Diabetes: Continue sliding scale. BS stable.   7.  Acute on chronic diastolic heart failure: Continue dialysis for Fluid removal and pt. Is improving.  - will resume Nadolol for now.   8.  Thrombocytopenia: Platelets have been low but stable - Continue monitoring  Family wanted to take the patient home but he is still quite deconditioned and discussed further with the daughter who has now agreed to placement to short-term rehab.  Social work made aware.  All the records are reviewed and case discussed with Care Management/Social Worker. Management plans discussed with the patient, family and they are in agreement.  CODE STATUS: DNR  DVT Prophylaxis: TED's & SCD's.   TOTAL TIME TAKING CARE OF THIS PATIENT: 30 minutes.   POSSIBLE D/C IN 1-2 DAYS, DEPENDING ON CLINICAL CONDITION.   Henreitta Leber M.D on 06/13/2017 at 2:01 PM  Between 7am to 6pm - Pager - [phone]  After 6pm go to [email or web address] - Proofreader  [facility]  Office  [phone]  CC: Primary care physician; Ria Bush, MD

## 2017-06-13 NOTE — Care Management (Addendum)
Spoke with patient and daughter [name]. Patient had declined skilled nursing placement but patient's adult children have informed patient that they can not meet his care needs in this weakened state.  A bed search has been initiated for skilled nursing facility placement today.  Discussed with patient and daughter [name] that patient's insurance would have to give approval; patient will have to participate with therapy.  Physical therapy eval performed on 5/15 and another 5/18.  Performed bed exercises and quickly fatigues. he has not ambulated with therapy yet.   Discussed with jennifer that DME could not be set up in the home until he actually discharges home- if he discharges to a skilled facility, then the facility would arrange for DME at discharge.  If patient  discharges home from [facility], requirement could be set up.  Patient would need hospital bed.  Has access to walker and chronic oxygen through [employer]. He[facility] up [facility] for [facility], RN PT OT Aide, SW.  jennifer says that patient lives with his wife "who also needs a lot of help." Patient is usually able to ambulate with his walker but "he has gotten less mobile over the last 2 months."  Chronic 02 with [employer].  Patient's HD catheter has been removed

## 2017-06-13 NOTE — Progress Notes (Signed)
[facility]  ROUNDING NOTE   Subjective:   HD catheter removed.   UOP consistently about 1500 cc.  Creatinine 1.23 (1.49) Feet are hurting + b/l edema   Objective:  Vital signs in last 24 hours:  Temp:  [98 F (36.7 C)-98.6 F (37 C)] 98.2 F (36.8 C) (05/20 0744) Pulse Rate:  [79-84] 82 (05/20 0744) Resp:  [18-28] 18 (05/20 0744) BP: (136-151)/(50-60) 151/52 (05/20 0744) SpO2:  [90 %-97 %] 97 % (05/20 0744) Weight:  [106.9 kg (235 lb 11.2 oz)-110.3 kg (243 lb 3.2 oz)] 106.9 kg (235 lb 11.2 oz) (05/20 4196)  Weight change: -1.089 kg (-2 lb 6.4 oz) Filed Weights   06/12/17 0447 06/13/17 0535 06/13/17 2229  Weight: 111.4 kg (245 lb 9.6 oz) 110.3 kg (243 lb 3.2 oz) 106.9 kg (235 lb 11.2 oz)    Intake/Output: I/O last 3 completed shifts: In: 480 [P.O.:480] Out: 1920 [Urine:1920]   Intake/Output this shift:  Total I/O In: -  Out: 100 [Urine:100]  Physical Exam: General: Laying in bed  Head: Normocephalic, atraumatic.   Eyes: Anicteric  Neck: Supple, trachea midline  Lungs:  Right rhonchi, [place] 02   Heart: regular  Abdomen:  Soft, nontender, bowel sounds present, urostomy in place  Extremities: 1+ peripheral edema  Rt foot > Left  Neurologic: Alert and oriented  Skin: No lesions       Basic Metabolic Panel: Recent Labs  Lab 06/06/17 1727 06/07/17 0347  06/09/17 0525 06/10/17 0600 06/11/17 0650 06/12/17 0636 06/13/17 0604  NA  --  138   < > 139 140 142 144 145  K  --  3.7   < > 4.0 4.2 4.5 4.5 4.5  CL  --  106   < > 105 107 107 107 107  CO2  --  29   < > 29 30 31  34* 31  GLUCOSE  --  78   < > 88 101* 98 106* 97  BUN  --  7   < > 16 21* 22* 26* 27*  CREATININE  --  1.03   < > 1.66* 1.54* 1.49* 1.23 1.03  CALCIUM  --  7.2*   < > 7.9* 7.9* 8.1* 8.3* 8.4*  MG  --  1.7  --  2.2  --   --   --  1.8  PHOS 2.1* 2.0*  2.0*  --  3.1  --   --  2.4*  --    < > = values in this interval not displayed.    Liver Function Tests: Recent Labs  Lab  06/07/17 0347 06/12/17 0636  ALBUMIN 2.0* 2.2*   No results for input(s): LIPASE, AMYLASE in the last 168 hours. No results for input(s): AMMONIA in the last 168 hours.  CBC: Recent Labs  Lab 06/08/17 0422 06/10/17 0600 06/11/17 0650  WBC 3.3* 3.2* 3.3*  HGB 8.6* 8.1* 8.0*  HCT 25.3* 24.3* 24.0*  MCV 93.1 93.4 94.2  PLT 84* 107* 112*    Cardiac Enzymes: No results for input(s): CKTOTAL, CKMB, CKMBINDEX, TROPONINI in the last 168 hours.  BNP: Invalid input(s): POCBNP  CBG: Recent Labs  Lab 06/12/17 0739 06/12/17 1132 06/12/17 1640 06/12/17 2054 06/13/17 0747  GLUCAP 92 110* 94 132* 81    Microbiology: Results for orders placed or performed during the hospital encounter of 05/30/17  Culture, blood (routine x 2)     Status: None   Collection Time: 05/31/17 12:05 AM  Result Value Ref Range Status  Specimen Description BLOOD RIGHT HAND  Final   Special Requests   Final    BOTTLES DRAWN AEROBIC AND ANAEROBIC Blood Culture adequate volume   Culture   Final    NO GROWTH 5 DAYS Performed at [facility], [address] [place], [place] [address]    Report Status 06/05/2017 FINAL  Final  Urine culture     Status: Abnormal   Collection Time: 05/31/17 12:05 AM  Result Value Ref Range Status   Specimen Description   Final    URINE, RANDOM Performed at [facility], [address], [place], [place] [address]    Special Requests   Final    NONE Performed at [facility], [address] [place], [place] [address]    Culture MULTIPLE SPECIES PRESENT, SUGGEST RECOLLECTION (A)  Final   Report Status 06/01/2017 FINAL  Final  Culture, blood (routine x 2)     Status: None   Collection Time: 05/31/17 12:08 AM  Result Value Ref Range Status   Specimen Description BLOOD LEFT [facility]  Final   Special Requests   Final    BOTTLES DRAWN AEROBIC AND ANAEROBIC Blood Culture adequate volume   Culture   Final    NO GROWTH 5 DAYS Performed at [facility], [address], [place], [place] [address]    Report Status 06/05/2017 FINAL  Final  MRSA PCR Screening     Status: None   Collection Time: 05/31/17  9:47 AM  Result Value Ref Range Status   MRSA by PCR NEGATIVE NEGATIVE Final    Comment:        The GeneXpert MRSA Assay (FDA approved for NASAL specimens only), is one component of a comprehensive MRSA colonization surveillance program. It is not intended to diagnose MRSA infection nor to guide or monitor treatment for MRSA infections. Performed at [facility], [address], [place], [place] [address]   Urine Culture     Status: Abnormal   Collection Time: 06/02/17  5:27 PM  Result Value Ref Range Status   Specimen Description   Final    URINE, RANDOM Performed at [facility], [address], [place], [place] [address]    Special Requests   Final    NONE Performed at [facility], [address] [place], [place] [address]    Culture (A)  Final    <10,000 COLONIES/mL INSIGNIFICANT GROWTH Performed at [facility] [address], [place], [place] [address]    Report Status 06/03/2017 FINAL  Final    Coagulation Studies: No results for input(s): LABPROT, INR in the last 72 hours.  Urinalysis: No results for input(s): COLORURINE, LABSPEC, PHURINE, GLUCOSEU, HGBUR, BILIRUBINUR, KETONESUR, PROTEINUR, UROBILINOGEN, NITRITE, LEUKOCYTESUR in the last 72 hours.  Invalid input(s): APPERANCEUR    Imaging: No results found.   Medications:    . docusate sodium  100 mg Oral BID  . feeding supplement (ENSURE ENLIVE)  237 mL Oral TID BM  . ferrous sulfate  325 mg Oral Q breakfast  . furosemide  40 mg Oral BID  . insulin aspart  0-5 Units Subcutaneous QHS  . insulin aspart  0-9 Units Subcutaneous TID WC  . magnesium oxide  400 mg Oral Daily  . multivitamin with minerals  1 tablet Oral Daily  . nystatin   Topical TID  . pantoprazole  40 mg Oral BID  . phosphorus  500  mg Oral Q4H  . simvastatin  20 mg Oral  QHS  . sodium chloride flush  10-40 mL Intracatheter Q12H  . umeclidinium-vilanterol  1 puff Inhalation Daily  . vitamin B-12  250 mcg Oral Daily   acetaminophen **OR** [DISCONTINUED] acetaminophen, albuterol, guaiFENesin-dextromethorphan, morphine injection, [DISCONTINUED] ondansetron **OR** ondansetron (ZOFRAN) IV, sodium chloride, sodium chloride flush  Assessment/ Plan:  Mr. [name] is a 84 y.o. white male with atrial fibrillation, congestive heart failure (no recent echo), COPD, hepatic cirrhosis, diabetes mellitus type II, gastric varices, history of bladder cancer status post ileal conduit, hypertension, hyperlipidemia who was admitted to [facility] on 05/30/2017 for GI bleed and acute renal failure. Placed on CRRT from 5/8 to 5/9, then on 5/9 had intermitted hemodialysis treatment, restarted on CRRT  5/10 to 5/13. Intermittent hemodialysis on 5/13, 5/14 and 5/16  1.  Acute renal failure with hyperkalemia on chronic kidney disease stage III   Acute renal failure secondary to ATN.  UOP satisfactory S Creatinine close to normal  2.  Anemia of chronic kidney disease with thrombocytopenia: hemoglobin 8. GI bleed on admission Endoscopy on 5/8 by Dr. Vicente Males - finding gastric varices  - Iron supplements - pantoprazole  3. LE edema. - lasix  Changed to PO fuorsemide  4. Acute respiratory failure: requiring O2 - baseline was 2 liters at home. With pneumonia, right pleural effusion and pulmonary edema.  - Completed 7 days of Unasyn.   Will sign off Please re consult as necessary   LOS: [place] 5/20/20199:57 AM

## 2017-06-14 ENCOUNTER — Ambulatory Visit (INDEPENDENT_AMBULATORY_CARE_PROVIDER_SITE_OTHER): Payer: PPO | Admitting: Vascular Surgery

## 2017-06-14 LAB — BLOOD GAS, ARTERIAL
Acid-Base Excess: 13.1 mmol/L — ABNORMAL HIGH (ref 0.0–2.0)
Acid-Base Excess: 14.8 mmol/L — ABNORMAL HIGH (ref 0.0–2.0)
BICARBONATE: 41.8 mmol/L — AB (ref 20.0–28.0)
Bicarbonate: 41.5 mmol/L — ABNORMAL HIGH (ref 20.0–28.0)
DELIVERY SYSTEMS: POSITIVE
Expiratory PAP: 5
FIO2: 0.36
FIO2: 0.3
Inspiratory PAP: 14
MECHANICAL RATE: 8
O2 Saturation: 88.6 %
O2 Saturation: 95.9 %
PCO2 ART: 83 mmHg — AB (ref 32.0–48.0)
PH ART: 7.31 — AB (ref 7.350–7.450)
PO2 ART: 61 mmHg — AB (ref 83.0–108.0)
Patient temperature: 37
Patient temperature: 37
pCO2 arterial: 67 mmHg (ref 32.0–48.0)
pH, Arterial: 7.4 (ref 7.350–7.450)
pO2, Arterial: 81 mmHg — ABNORMAL LOW (ref 83.0–108.0)

## 2017-06-14 LAB — BASIC METABOLIC PANEL
Anion gap: 2 — ABNORMAL LOW (ref 5–15)
BUN: 31 mg/dL — ABNORMAL HIGH (ref 6–20)
CALCIUM: 8.7 mg/dL — AB (ref 8.9–10.3)
CO2: 36 mmol/L — ABNORMAL HIGH (ref 22–32)
CREATININE: 1.41 mg/dL — AB (ref 0.61–1.24)
Chloride: 109 mmol/L (ref 101–111)
GFR calc Af Amer: 52 mL/min — ABNORMAL LOW (ref >60)
GFR, EST NON AFRICAN AMERICAN: 45 mL/min — AB (ref >60)
Glucose, Bld: 113 mg/dL — ABNORMAL HIGH (ref 65–99)
POTASSIUM: 5.1 mmol/L (ref 3.5–5.1)
SODIUM: 147 mmol/L — AB (ref 135–145)

## 2017-06-14 LAB — GLUCOSE, CAPILLARY
Glucose-Capillary: 113 mg/dL — ABNORMAL HIGH (ref 65–99)
Glucose-Capillary: 117 mg/dL — ABNORMAL HIGH (ref 65–99)
Glucose-Capillary: 132 mg/dL — ABNORMAL HIGH (ref 65–99)
Glucose-Capillary: 133 mg/dL — ABNORMAL HIGH (ref 65–99)

## 2017-06-14 LAB — MAGNESIUM: Magnesium: 1.7 mg/dL (ref 1.7–2.4)

## 2017-06-14 LAB — PHOSPHORUS: PHOSPHORUS: 3.8 mg/dL (ref 2.5–4.6)

## 2017-06-14 NOTE — Progress Notes (Signed)
Clinical Social Worker (CSW) met with patient's daughter [name] at bedside and presented bed offers. Per [name] she will review offers with patient when he is more awake and call CSW with choice. CSW made [name] aware that [facility] will likely deny SNF. Per [name] patient can't pay privately for SNF. Per [facility] case manager medical director is reviewing case. MD aware of above.   [employer], LCSW [phone]

## 2017-06-14 NOTE — Care Management (Signed)
Per report understand that patient was able to walk prior to admission and is pending SNF for short-term rehab. Patient is requiring on/off Bipap. RNCM to follow.  RNCM to consider [facility] referral if patient returns to home.

## 2017-06-14 NOTE — Progress Notes (Signed)
Pharmacy Electrolyte Monitoring Consult:  Pharmacy consulted to assist in monitoring and replacing electrolytes in this 84 y.o. male admitted on 05/30/2017 with Weakness  Labs:  Sodium (mmol/L)  Date Value  06/14/2017 147 (H)  06/15/2011 141   Potassium (mmol/L)  Date Value  06/14/2017 5.1  06/15/2011 3.7   Magnesium (mg/dL)  Date Value  06/14/2017 1.7  06/14/2011 1.7 (L)   Phosphorus (mg/dL)  Date Value  06/14/2017 3.8   Calcium (mg/dL)  Date Value  06/14/2017 8.7 (L)   Calcium, Total (mg/dL)  Date Value  06/15/2011 8.3 (L)   Albumin (g/dL)  Date Value  06/12/2017 2.2 (L)  06/11/2011 4.1    Assessment/Plan: 5/18 Electrolytes WNL. No supplementation needed. Patient on Mag-Ox 400 mg PO daily. Currently on intermittent Hemodialysis-monitoring daily per Nephrology Will recheck electrolytes on Monday and continue to replace as needed.  5/20 phosphorous 2.4. Will replace with KPhos 2 tabs q4hx2 and re-check labs in am. Temp HD catheter removed (no longer required).  5/21: phosphorous level has been corrected following replacement 5/20. Potassium noted to be at the upper end of normal and will monitor closely.  BMP will be ordered tomorrow and we will follow electrolyte levels  Vallery Sa, PhamD Clinical Pharmacist 06/14/2017 2:57 PM

## 2017-06-14 NOTE — Progress Notes (Signed)
Resting in bed with eyes closed. Family member at bedside.

## 2017-06-14 NOTE — Progress Notes (Signed)
Per [facility] case manager medical director denied SNF and offered a peer to peer. Per [facility] case manager peer to peer will have to be completed tomorrow by 12 noon. Attending MD aware of above.   [employer], LCSW [phone]

## 2017-06-14 NOTE — Progress Notes (Signed)
[name] at [place] NAME: [name]    MR#:  [id number]  DATE OF BIRTH:  1933-10-31  SUBJECTIVE:   Pt. Was a bit lethargic this a.m. And noted to have acute on chronic resp. Failure with Hypercapnia.  Placed on BiPAP and hypercapnia has improved patient is more awake now.  Awaiting short-term rehab placement.  Now on oral diuretics and tolerating it well.  Creatinine stable.  REVIEW OF SYSTEMS:    Review of Systems  Constitutional: Negative for chills and fever.  HENT: Negative for congestion and tinnitus.   Eyes: Negative for blurred vision and double vision.  Respiratory: Negative for cough, shortness of breath and wheezing.   Cardiovascular: Negative for chest pain, orthopnea and PND.  Gastrointestinal: Negative for abdominal pain, diarrhea, nausea and vomiting.  Genitourinary: Negative for dysuria and hematuria.  Neurological: Positive for weakness (Generalized. ). Negative for dizziness, sensory change and focal weakness.  All other systems reviewed and are negative.   Nutrition: Dysphagia III with thin liquids Tolerating Diet: Yes Tolerating PT: Eval noted. (SNF)   DRUG ALLERGIES:  No Known Allergies  VITALS:  Blood pressure (!) 137/49, pulse (!) 55, temperature 97.7 F (36.5 C), resp. rate 18, height 5\' 8"  (1.727 m), weight 107.2 kg (236 lb 6.4 oz), SpO2 97 %.  PHYSICAL EXAMINATION:   Physical Exam  GENERAL:  84 y.o.-year-old obese patient lying in bed lethargic/encephalopathic.  EYES: Pupils equal, round, reactive to light and accommodation. No scleral icterus. Extraocular muscles intact.  HEENT: Head atraumatic, normocephalic. Oropharynx and nasopharynx clear.  NECK:  Supple, no jugular venous distention. No thyroid enlargement, no tenderness.  LUNGS: Normal breath sounds bilaterally, no wheezing, bibasilar rales, No rhonchi. No use of accessory muscles of respiration.  CARDIOVASCULAR: S1, S2 normal. No murmurs, rubs, or  gallops.  ABDOMEN: Soft, nontender, nondistended. Bowel sounds present. No organomegaly or mass.  EXTREMITIES: No cyanosis, clubbing, + 2 edema b/l    NEUROLOGIC: Cranial nerves II through XII are intact. No focal Motor or sensory deficits b/l.  Globally weak. PSYCHIATRIC: The patient is alert and oriented x 3.  SKIN: No obvious rash, lesion, or ulcer.    LABORATORY PANEL:   CBC Recent Labs  Lab 06/11/17 0650  WBC 3.3*  HGB 8.0*  HCT 24.0*  PLT 112*   ------------------------------------------------------------------------------------------------------------------  Chemistries  Recent Labs  Lab 06/14/17 0818  NA 147*  K 5.1  CL 109  CO2 36*  GLUCOSE 113*  BUN 31*  CREATININE 1.41*  CALCIUM 8.7*  MG 1.7   ------------------------------------------------------------------------------------------------------------------  Cardiac Enzymes No results for input(s): TROPONINI in the last 168 hours. ------------------------------------------------------------------------------------------------------------------  RADIOLOGY:  No results found.   ASSESSMENT AND PLAN:   84 year old male with history of PAF, chronic diastolic heart failure, hepatic cirrhosis and diabetes who presented to the emergency room due to GI bleed and acute kidney injury.  1.    Acute on chronic respiratory failure with hypoxia/hypercapnia- secondary to CHF and also underlying COPD.  Patient was diuresed and has improved but this morning noted to have worsening mental status and ABG showing hypercapnia with a normal pH. -Placed on BiPAP and hypercapnia has improved and so has his mental status. - Patient's O2 was at 5 L now weaned down to 2 L we will continue to monitor.    2.  Septic shock due to pneumonia: Much improved, off antibiotics, cultures have been negative. -Patient has been adequately treated with IV antibiotics which have now  been discontinued.   3.  Acute Renal failure: This was due  to ATN -While the patient was in ICU on vasopressors he was on CRRT but now has been weaned off of it.  He is making good urine and currently is not dialysis dependent. - cont. Oral Torsemide and diuresing well with it.  Creatinine has normalized.  Nephrology has signed off.   4.  GI bleed with anemia of chronic disease status post EGD showing gastritis/varices - Continue PPI. Hg. Stable.   5.  COPD without signs of exacerbation - cont. Anoro-Ellipta  6  Diabetes: Continue sliding scale. BS stable.   7.  Acute on chronic diastolic heart failure: improved since admission and cont. Oral Torsemide for now. - cont. Nadolol.    8.  Thrombocytopenia: Platelets have been low but stable - Continue monitoring  Physical therapy recommending short-term rehab and family in agreement.  Social work has initiated Pension scheme manager.  All the records are reviewed and case discussed with Care Management/Social Worker. Management plans discussed with the patient, family and they are in agreement.  CODE STATUS: DNR  DVT Prophylaxis: TED's & SCD's.   TOTAL Critical Care TIME TAKING CARE OF THIS PATIENT: 40 minutes.   POSSIBLE D/C IN 2-3 DAYS, DEPENDING ON CLINICAL CONDITION.   Henreitta Leber M.D on 06/14/2017 at 3:17 PM  Between 7am to 6pm - Pager - [phone]  After 6pm go to [email or web address] - Proofreader  [facility]  Office  [phone]  CC: Primary care physician; Ria Bush, MD

## 2017-06-14 NOTE — Progress Notes (Signed)
Awake in bed. Denies pain. Family member at bedside.  Call bell in reach.

## 2017-06-14 NOTE — Progress Notes (Signed)
Received pt on bed into 159. Pt awake  And approp. Cooperative with care. Family member at bedside. Assessment done. Pt made comfortable for rest. Instructed to call for any needs.

## 2017-06-14 NOTE — Clinical Social Work Placement (Signed)
   CLINICAL SOCIAL WORK PLACEMENT  NOTE  Date:  06/14/2017  Patient Details  Name: [name] MRN: [id number] Date of Birth: 06-Nov-1933  Clinical Social Work is seeking post-discharge placement for this patient at the [place] level of care (*CSW will initial, date and re-position this form in  chart as items are completed):  Yes   Patient/family provided with [place] Work Department's list of facilities offering this level of care within the geographic area requested by the patient (or if unable, by the patient's family).  Yes   Patient/family informed of their freedom to choose among providers that offer the needed level of care, that participate in Medicare, Medicaid or managed care program needed by the patient, have an available bed and are willing to accept the patient.  Yes   Patient/family informed of [facility]'s ownership interest in [facility] and [facility], as well as of the fact that they are under no obligation to receive care at these facilities.  PASRR submitted to EDS on 06/10/17     PASRR number received on 06/10/17     Existing PASRR number confirmed on       FL2 transmitted to all facilities in geographic area requested by pt/family on 06/13/17     FL2 transmitted to all facilities within larger geographic area on       Patient informed that his/her managed care company has contracts with or will negotiate with certain facilities, including the following:        Yes   Patient/family informed of bed offers received.  Patient chooses bed at       Physician recommends and patient chooses bed at      Patient to be transferred to   on  .  Patient to be transferred to facility by       Patient family notified on   of transfer.  Name of family member notified:        PHYSICIAN       Additional Comment:    _______________________________________________ Leimomi Zervas, Veronia Beets, LCSW 06/14/2017, 11:17 AM

## 2017-06-14 NOTE — Progress Notes (Signed)
Chaplain was rounding for unit's Pallative care Pt. Daughter was at Pt bedside. Chaplain used humor as an introduction and offered prayer for the family. Daughter wanted prayer for healing.    06/14/17 1000  Clinical Encounter Type  Visited With Patient and family together  Visit Type Follow-up  Referral From Chaplain  Spiritual Encounters  Spiritual Needs Prayer

## 2017-06-15 ENCOUNTER — Encounter: Payer: Self-pay | Admitting: *Deleted

## 2017-06-15 LAB — BASIC METABOLIC PANEL
Anion gap: 6 (ref 5–15)
BUN: 35 mg/dL — AB (ref 6–20)
CHLORIDE: 107 mmol/L (ref 101–111)
CO2: 34 mmol/L — AB (ref 22–32)
Calcium: 8.7 mg/dL — ABNORMAL LOW (ref 8.9–10.3)
Creatinine, Ser: 1.51 mg/dL — ABNORMAL HIGH (ref 0.61–1.24)
GFR calc Af Amer: 48 mL/min — ABNORMAL LOW (ref >60)
GFR, EST NON AFRICAN AMERICAN: 42 mL/min — AB (ref >60)
GLUCOSE: 92 mg/dL (ref 65–99)
POTASSIUM: 4.7 mmol/L (ref 3.5–5.1)
Sodium: 147 mmol/L — ABNORMAL HIGH (ref 135–145)

## 2017-06-15 LAB — GLUCOSE, CAPILLARY
Glucose-Capillary: 92 mg/dL (ref 65–99)
Glucose-Capillary: 105 mg/dL — ABNORMAL HIGH (ref 65–99)
Glucose-Capillary: 113 mg/dL — ABNORMAL HIGH (ref 65–99)
Glucose-Capillary: 106 mg/dL — ABNORMAL HIGH (ref 65–99)

## 2017-06-15 MED ORDER — TORSEMIDE 20 MG PO TABS
10.0000 mg | ORAL_TABLET | Freq: Every day | ORAL | Status: DC
  Administered 2017-06-16: 10 mg via ORAL
  Filled 2017-06-15: qty 1

## 2017-06-15 NOTE — Care Management (Addendum)
Patient suffers from atrial fibrillation, COPD, hemorrhoids, hypertension and diastolic heart failure and has trouble breathing at night when head is elevated less than 30 degrees. Bed wedges do not provide enough elevation to resolve breathing issues cause patient to require frequent / immediate changes in body position which cannot be achievedwith a normal bed. Hospital bed and [facility] Lift [facility] from [facility]. NIV/Bipap assessment/review also requested from [facility]. [facility] referral in place.

## 2017-06-15 NOTE — Progress Notes (Signed)
Pharmacy Electrolyte Monitoring Consult:  Pharmacy consulted to assist in monitoring and replacing electrolytes in this 84 y.o. male admitted on 05/30/2017 with Weakness  Labs:  Potassium (mmol/L)  Date Value  06/15/2017 4.7      Magnesium (mg/dL)  Date Value  06/14/2017 1.7      Phosphorus (mg/dL)  Date Value  06/14/2017 3.8    Assessment/Plan: 5/18 Electrolytes WNL. No supplementation needed. Patient on Mag-Ox 400 mg PO daily. Currently on intermittent Hemodialysis-monitoring daily per Nephrology Will recheck electrolytes on Monday and continue to replace as needed.  5/20 phosphorous 2.4. Will replace with KPhos 2 tabs q4hx2 and re-check labs in am. Temp HD catheter removed (no longer required).  5/21: phosphorous level has been corrected following replacement Potassium noted to be at the upper end of normal and will monitor. 5/22: Electrolytes now WNL following replacement, including potassium. Will re-check electrolytes on 5/24 and replace as needed.  Vallery Sa, PhamD Clinical Pharmacist 06/15/2017 10:33 AM

## 2017-06-15 NOTE — Plan of Care (Signed)
  Problem: Education: Goal: Knowledge of General Education information will improve Outcome: Progressing   Problem: Health Behavior/Discharge Planning: Goal: Ability to manage health-related needs will improve Outcome: Progressing   Problem: Clinical Measurements: Goal: Ability to maintain clinical measurements within normal limits will improve Outcome: Progressing Goal: Will remain free from infection Outcome: Progressing Goal: Diagnostic test results will improve Outcome: Progressing Goal: Respiratory complications will improve Outcome: Progressing Goal: Cardiovascular complication will be avoided Outcome: Progressing   Problem: Clinical Measurements: Goal: Will remain free from infection Outcome: Progressing   Problem: Clinical Measurements: Goal: Diagnostic test results will improve Outcome: Progressing   Problem: Nutrition: Goal: Adequate nutrition will be maintained Outcome: Progressing   Problem: Activity: Goal: Risk for activity intolerance will decrease Outcome: Progressing   Problem: Coping: Goal: Level of anxiety will decrease Outcome: Progressing   Problem: Elimination: Goal: Will not experience complications related to bowel motility Outcome: Progressing Goal: Will not experience complications related to urinary retention Outcome: Progressing   Problem: Pain Managment: Goal: General experience of comfort will improve Outcome: Progressing   Problem: Safety: Goal: Ability to remain free from injury will improve Outcome: Progressing   Problem: Skin Integrity: Goal: Risk for impaired skin integrity will decrease Outcome: Progressing   Problem: Education: Goal: Knowledge of disease or condition will improve Outcome: Progressing Goal: Knowledge of the prescribed therapeutic regimen will improve Outcome: Progressing   Problem: Activity: Goal: Ability to tolerate increased activity will improve Outcome: Progressing Goal: Will verbalize the  importance of balancing activity with adequate rest periods Outcome: Progressing   Problem: Respiratory: Goal: Ability to maintain a clear airway will improve Outcome: Progressing Goal: Levels of oxygenation will improve Outcome: Progressing Goal: Ability to maintain adequate ventilation will improve Outcome: Progressing

## 2017-06-15 NOTE — Care Management Important Message (Signed)
Important Message  Patient Details  Name: [name] MRN: [id number] Date of Birth: [birth year]   Medicare Important Message Given:  Yes    [name] [name] 06/15/2017, 12:21 PM

## 2017-06-15 NOTE — Consult Note (Signed)
   [facility] CM Inpatient Consult   06/15/2017  [name] [birth year] [id number]   Referral received from inpatient Social Worker for [place] [facility] services and post hospital discharge follow up related to a diagnosis of Heart Failure with and extended hospital stay of >15 days. Patient was evaluated for community based chronic disease management services with [facility] care Management Program as a benefit of patient's [employer] Medicare. Met with the patient and daughter [name] at the bedside to explain [place] Management services. Patient endorses his primary care provider to be [name] MD who is listed as completing their own transition of care call post discharge. Patient states he is extremely weak from being in the hospital for 15 days. Daughter expressed a need for caregiver assistance. Discussed Homecare Providers 30 day reduction in care. Daughter and Patient agreeable for this service. Patient expressed concerns surrounding transportation and medication affordability. Patient stated his inhalers cost  him the most. Verbal consent recieved. Patient gave [phone] as his home number. He also gave verbal permission to call his daughter [name] to set up any services, because he has trouble getting to the phone.Patient will receive post hospital discharge calls and be evaluated for monthly home visits. He will also be engaged by [place] [facility] and [place].  [place] Management services does not interfere with or replace any services arranged by the inpatient care management team. RNCM left contact information and [facility] literature at the bedside. Made inpatient social worker aware [facility] will be following for care management. For additional questions please contact:   Seth Higginbotham RN, [facility] Liaison  [phone]) Business Mobile [phone]) Toll free office

## 2017-06-15 NOTE — Progress Notes (Signed)
[place] [facility] [place] NAME: [name]    MR#:  [id number]  DATE OF BIRTH:  01/11/1934  SUBJECTIVE:   Patient was completely alert and oriented today.  REVIEW OF SYSTEMS:    Review of Systems  Constitutional: Negative for chills and fever.  HENT: Negative for congestion and tinnitus.   Eyes: Negative for blurred vision and double vision.  Respiratory: Negative for cough, shortness of breath and wheezing.   Cardiovascular: Negative for chest pain, orthopnea and PND.  Gastrointestinal: Negative for abdominal pain, diarrhea, nausea and vomiting.  Genitourinary: Negative for dysuria and hematuria.  Neurological: Positive for weakness (Generalized. ). Negative for dizziness, sensory change and focal weakness.  All other systems reviewed and are negative.   Nutrition: Dysphagia III with thin liquids Tolerating Diet: Yes Tolerating PT: Eval noted. (SNF)   DRUG ALLERGIES:  No Known Allergies  VITALS:  Blood pressure (!) 125/42, pulse 67, temperature 99.1 F (37.3 C), temperature source Oral, resp. rate 18, height 5\' 8"  (1.727 m), weight 108.6 kg (239 lb 6.7 oz), SpO2 93 %.  PHYSICAL EXAMINATION:   Physical Exam  GENERAL:  84 y.o.-year-old obese patient lying in bed lethargic/encephalopathic.  EYES: Pupils equal, round, reactive to light and accommodation. No scleral icterus. Extraocular muscles intact.  HEENT: Head atraumatic, normocephalic. Oropharynx and nasopharynx clear.  NECK:  Supple, no jugular venous distention. No thyroid enlargement, no tenderness.  LUNGS: Normal breath sounds bilaterally, no wheezing, bibasilar rales, No rhonchi. No use of accessory muscles of respiration.  CARDIOVASCULAR: S1, S2 normal. No murmurs, rubs, or gallops.  ABDOMEN: Soft, nontender, nondistended. Bowel sounds present. No organomegaly or mass.  EXTREMITIES: No cyanosis, clubbing, + 2 edema b/l    NEUROLOGIC: Cranial nerves II through XII are intact.  No focal Motor or sensory deficits b/l.  Globally weak. PSYCHIATRIC: The patient is alert and oriented x 3.  SKIN: No obvious rash, lesion, or ulcer.    LABORATORY PANEL:   CBC Recent Labs  Lab 06/11/17 0650  WBC 3.3*  HGB 8.0*  HCT 24.0*  PLT 112*   ------------------------------------------------------------------------------------------------------------------  Chemistries  Recent Labs  Lab 06/14/17 0818 06/15/17 0504  NA 147* 147*  K 5.1 4.7  CL 109 107  CO2 36* 34*  GLUCOSE 113* 92  BUN 31* 35*  CREATININE 1.41* 1.51*  CALCIUM 8.7* 8.7*  MG 1.7  --    ------------------------------------------------------------------------------------------------------------------  Cardiac Enzymes No results for input(s): TROPONINI in the last 168 hours. ------------------------------------------------------------------------------------------------------------------  RADIOLOGY:  No results found.   ASSESSMENT AND PLAN:   84 year old male with history of PAF, chronic diastolic heart failure, hepatic cirrhosis and diabetes who presented to the emergency room due to GI bleed and acute kidney injury.  1.    Acute on chronic respiratory failure with hypoxia/hypercapnia - secondary to CHF and also underlying COPD.  Patient was diuresed and has improved  06/14/17 morning noted to have worsening mental status and ABG showing hypercapnia with a normal pH. - Placed on BiPAP and hypercapnia has improved and so has his mental status. - now weaned down to 2 L we will continue to monitor.   - I don;t think pt will need ongoing BIPAP.  2.  Septic shock due to pneumonia: Much improved, off antibiotics, cultures have been negative. - Patient has been adequately treated with IV antibiotics which have now been discontinued.   3.  Acute Renal failure: This was due to ATN -While the patient was in ICU  on vasopressors he was on CRRT but now has been weaned off of it.  He is making good  urine and currently is not dialysis dependent. - cont. Oral Torsemide and diuresing well with it.  Creatinine has normalized.  Nephrology has signed off.   - renal func slightly worse- so decrease dose of Torsemide.  4.  GI bleed with anemia of chronic disease status post EGD showing gastritis/varices - Continue PPI. Hg. Stable.   5.  COPD without signs of exacerbation - cont. Anoro-Ellipta  6  Diabetes: Continue sliding scale. BS stable.   7.  Acute on chronic diastolic heart failure: improved since admission and cont. Oral Torsemide for now. - cont. Nadolol.    8.  Thrombocytopenia: Platelets have been low but stable - Continue monitoring  Physical therapy recommending short-term rehab and family in agreement.  Social work has initiated Pension scheme manager.  Patientsuffersfromatrial fibrillation, COPD, hemorrhoids, hypertension and diastolic heart failure [id number] degrees.Bedwedgesdonotprovideenoughelevationtoresolvebreathingissuescausepatienttorequirefrequent/immediatechangesinbodypositionwhichcannotbeachievedwithanormalbed. Hospital bed and [facility] L[facility] from [facility]. NIV/Bipap assessment/review also requested from [facility]. [facility] referral in place.         All the records are reviewed and case discussed with Care Management/Social Worker. Management plans discussed with the patient, family and they are in agreement.  CODE STATUS: DNR  DVT Prophylaxis: TED's & SCD's.   TOTAL Critical Care TIME TAKING CARE OF THIS PATIENT: 40 minutes.   POSSIBLE D/C IN 1-2 DAYS, DEPENDING ON CLINICAL CONDITION.   Vaughan Basta M.D on 06/15/2017 at 5:59 PM  Between 7am to 6pm - Pager - [phone]  After 6pm go to [email or web address] - Proofreader  [facility]  Office  [phone]  CC: Primary care physician; Ria Bush, MD

## 2017-06-15 NOTE — Progress Notes (Signed)
Clinical Education officer, museum (CSW) gave patient's daughter [name] the SNF denial letter from [employer]. [name] was at patient's bedside. Plan is for patient to D/C home. RN case manager aware of above.   [employer], LCSW [phone]

## 2017-06-15 NOTE — Progress Notes (Signed)
Pt seen, more alert today, did not need BIPAP. His renal func is slightly worse, so changed his torsemide.  He may need frequent follow up of his renal func and adjusting his diuretics dose after discharge.

## 2017-06-15 NOTE — Progress Notes (Addendum)
Per [employer] case manager a peer to peer was completed yesterday. Per [employer] case manager authorization will depend on if patient requires a Bi-pap at discharge. CSW paged MD to inquire about Bi-pap.   MD called CSW back and stated that he would look into the Bi-pap. Clinical Social Worker (CSW) met with patient and his daughter [name] at bedside and made them aware of above. Per [name] they chose [facility] if [facility] will approve it. [facility] liaison is aware of above.   [employer], LCSW [phone]

## 2017-06-15 NOTE — Progress Notes (Signed)
Per MD patient does not require a Bi-pap. Per [employer] case manager a SNF denial letter will be sent out. Patient and his daughter [name] are aware of above. Plan is to D/C home. MD and RN case manager aware of above. Janci with [facility] is aware of above and met with patient and daughter to set up [facility] services in the home.    [employer], LCSW [phone]

## 2017-06-15 NOTE — Progress Notes (Signed)
Physical Therapy Treatment Patient Details Name: [name] MRN: [id number] DOB: Jun 01, 1933 Today's Date: 06/15/2017    History of Present Illness Pt admitted for AKI with complaints of weakness. According to pt, after vein surgery, pt drastically became weak and slid out of bed at home. Hospital stay complicated by CRRT and now temp IJ placed for HD. Recent admission for GI bleed and ARF. History includes CHF, Afib, COPD, DM, bladder ca, HTN.     PT Comments    Pt presents with deficits in strength, transfers, mobility, gait, balance, and activity tolerance.  Functionally pt remains very weak but did present with noticeable improvement from last session.  Pt required decreased assistance during sup to sit with improved overall effort.  Pt again unable to stand from the EOB during transfer attempts but pushed with improved force as noted by mattress decompression during attempts.  Pt will benefit from PT services in a SNF setting upon discharge to safely address above deficits for decreased caregiver assistance and eventual return to PLOF.     Follow Up Recommendations  SNF;Supervision for mobility/OOB     Equipment Recommendations  None recommended by PT    Recommendations for Other Services       Precautions / Restrictions Precautions Precautions: Fall Precaution Comments: Watch urostomy bag, R side Restrictions Weight Bearing Restrictions: No    Mobility  Bed Mobility Overal bed mobility: Needs Assistance Bed Mobility: Supine to Sit;Sit to Supine     Supine to sit: Min assist Sit to supine: Mod assist   General bed mobility comments: Assist during bed mobility for BLEs in and out of bed and for full upright sitting but decreased assistance overall compared to previous session  Transfers                 General transfer comment: Pt unable to clear surface of bed during sit to/from stand transfer attempts from various height surfaces  Ambulation/Gait              General Gait Details: Unable   Stairs             Wheelchair Mobility    Modified Rankin (Stroke Patients Only)       Balance Overall balance assessment: Needs assistance Sitting-balance support: Feet supported Sitting balance-Leahy Scale: Good                                      Cognition Arousal/Alertness: Awake/alert Behavior During Therapy: WFL for tasks assessed/performed Overall Cognitive Status: Within Functional Limits for tasks assessed                                        Exercises Total Joint Exercises Ankle Circles/Pumps: Strengthening;Both;10 reps;5 reps Quad Sets: Strengthening;Both;10 reps;5 reps Gluteal Sets: Strengthening;5 reps;Both;10 reps Heel Slides: AAROM;AROM;Both;5 reps Hip ABduction/ADduction: AROM;AAROM;Both;10 reps Straight Leg Raises: AROM;AAROM;Both;10 reps Long Arc Quad: AROM;Both;10 reps Knee Flexion: AROM;Both;10 reps Other Exercises Other Exercises: HEP education and review with pt and daughter for BLE APs, QS, and GS x 10 each 5-6x/day Other Exercises: Bilateral supine leg press with manual resistance x 10     General Comments        Pertinent Vitals/Pain Pain Assessment: No/denies pain    Home Living  Prior Function            PT Goals (current goals can now be found in the care plan section) Progress towards PT goals: Progressing toward goals    Frequency    Min 2X/week      PT Plan Current plan remains appropriate    Co-evaluation              AM-PAC PT "6 Clicks" Daily Activity  Outcome Measure                   End of Session Equipment Utilized During Treatment: Gait belt;Oxygen Activity Tolerance: Patient tolerated treatment well Patient left: in bed;with call bell/phone within reach;with bed alarm set;with family/visitor present;with nursing/sitter in room Nurse Communication: Mobility status PT Visit Diagnosis:  Unsteadiness on feet (R26.81);Muscle weakness (generalized) (M62.81);Difficulty in walking, not elsewhere classified (R26.2)     Time: 4709-2957 PT Time Calculation (min) (ACUTE ONLY): 30 min  Charges:  $Therapeutic Exercise: 8-22 mins $Therapeutic Activity: 8-22 mins                    G Codes:       DRoyetta Asal PT, DPT 06/15/17, 5:29 PM

## 2017-06-16 LAB — BASIC METABOLIC PANEL
Anion gap: 6 (ref 5–15)
BUN: 39 mg/dL — AB (ref 6–20)
CALCIUM: 8.9 mg/dL (ref 8.9–10.3)
CHLORIDE: 105 mmol/L (ref 101–111)
CO2: 35 mmol/L — ABNORMAL HIGH (ref 22–32)
CREATININE: 1.72 mg/dL — AB (ref 0.61–1.24)
GFR calc non Af Amer: 36 mL/min — ABNORMAL LOW (ref >60)
GFR, EST AFRICAN AMERICAN: 41 mL/min — AB (ref >60)
Glucose, Bld: 157 mg/dL — ABNORMAL HIGH (ref 65–99)
Potassium: 4.4 mmol/L (ref 3.5–5.1)
SODIUM: 146 mmol/L — AB (ref 135–145)

## 2017-06-16 LAB — GLUCOSE, CAPILLARY
Glucose-Capillary: 88 mg/dL (ref 65–99)
Glucose-Capillary: 121 mg/dL — ABNORMAL HIGH (ref 65–99)

## 2017-06-16 MED ORDER — TORSEMIDE 10 MG PO TABS: 10.0000 mg | ORAL_TABLET | Freq: Every day | ORAL | 0 refills | Status: DC

## 2017-06-16 MED ORDER — CALCIUM CARBONATE ANTACID 500 MG PO CHEW: 1.0000 | CHEWABLE_TABLET | Freq: Two times a day (BID) | ORAL | Status: DC | PRN

## 2017-06-16 MED ORDER — NADOLOL 20 MG PO TABS: 20.0000 mg | ORAL_TABLET | Freq: Every day | ORAL | 0 refills | Status: DC

## 2017-06-16 NOTE — Progress Notes (Signed)
Daily Progress Note   Patient Name: [name]       Date: 06/16/2017 DOB: 1933-11-18  Age: 84 y.o. MRN#: [id number] Attending Physician: Vaughan Basta, * Primary Care Physician: Ria Bush, MD Admit Date: 05/30/2017  Reason for Consultation/Follow-up: Establishing goals of care  Subjective: Patient awake in bed. He is alert and oriented x3. Denies any pain or discomfort. Does complain of intermittent acid reflux throughout the day. States he would take a Tums at home. Daughter at bedside. She states they are preparing and planning to take him home with HHPT and equipment, due to denial for skilled rehab. Both daughter and patient verbalizes that they are ok with this decision and with equipment they will be fine. Daughter is also a [employer] manager. They are aware that Dr. Anselm Jungling has made note of their need of a hospital bed and hoyer lift. They are still in agreement with outpatient palliative services in the home.   Chart reviewed.  Length of Stay: 16  Current Medications: Scheduled Meds:  . docusate sodium  100 mg Oral BID  . feeding supplement (ENSURE ENLIVE)  237 mL Oral TID BM  . ferrous sulfate  325 mg Oral Q breakfast  . insulin aspart  0-5 Units Subcutaneous QHS  . insulin aspart  0-9 Units Subcutaneous TID WC  . magnesium oxide  400 mg Oral Daily  . multivitamin with minerals  1 tablet Oral Daily  . nadolol  40 mg Oral Daily  . nystatin   Topical TID  . pantoprazole  40 mg Oral BID  . simvastatin  20 mg Oral QHS  . sodium chloride flush  10-40 mL Intracatheter Q12H  . torsemide  10 mg Oral Daily  . umeclidinium-vilanterol  1 puff Inhalation Daily  . vitamin B-12  250 mcg Oral Daily    Continuous Infusions:   PRN Meds: acetaminophen **OR** [DISCONTINUED]  acetaminophen, albuterol, bisacodyl, guaiFENesin-dextromethorphan, morphine injection, [DISCONTINUED] ondansetron **OR** ondansetron (ZOFRAN) IV, sodium chloride, sodium chloride flush  Physical Exam  Constitutional: Vital signs are normal. He appears well-developed. He is cooperative. He has a sickly appearance.  Cardiovascular: Normal rate, regular rhythm, normal heart sounds, intact distal pulses and normal pulses.  Pulmonary/Chest: He has decreased breath sounds in the right lower field and the left lower field.  Abdominal: Soft. Bowel sounds are normal.  Neurological: He is alert.  Psychiatric: He has a normal mood and affect. His speech is normal and behavior is normal. Judgment and thought content normal. Cognition and memory are normal.            Vital Signs: BP (!) 122/52   Pulse 60   Temp (!) 97.5 F (36.4 C) (Oral)   Resp 20   Ht 5\' 8"  (1.727 m)   Wt 106.9 kg (235 lb 9.6 oz)   SpO2 99%   BMI 35.82 kg/m  SpO2: SpO2: 99 % O2 Device: O2 Device: Nasal Cannula O2 Flow Rate: O2 Flow Rate (L/min): 2 L/min  Intake/output summary:   Intake/Output Summary (Last 24 hours) at 06/16/2017 1115 Last data filed at 06/16/2017 7253 Gross per 24 hour  Intake 720 ml  Output 1550 ml  Net -830 ml   LBM: Last BM Date: 06/15/17 Baseline Weight: Weight: 97.5 kg (215 lb) Most recent weight: Weight: 106.9 kg (235 lb 9.6 oz)       Palliative Assessment/Data:PPS 30%   Flowsheet Rows     Most Recent Value  Intake Tab  Referral Department  Hospitalist  Unit at Time of Referral  Med/Surg Unit  Palliative Care Primary Diagnosis  Cancer  Date Notified  05/30/17  Palliative Care Type  New Palliative care  Reason for referral  Clarify Goals of Care  Date of Admission  05/30/17  Date first seen by Palliative Care  06/06/17  # of days Palliative referral response time  7 Day(s)  # of days IP prior to Palliative referral  0  Clinical Assessment  Psychosocial & Spiritual Assessment    Palliative Care Outcomes      Patient Active Problem List   Diagnosis Date Noted  . Cirrhosis of liver with ascites ([place])   . Palliative care by specialist   . Advance care planning   . Acute kidney injury superimposed on chronic kidney disease ([place]) 05/31/2017  . Varicose veins of both legs with edema 11/26/2016  . Bilateral leg weakness 10/15/2016  . Cold intolerance 12/16/2015  . Generalized weakness 09/09/2015  . Health maintenance examination 03/12/2015  . Psoriasis 09/09/2014  . Advanced care planning/counseling discussion 02/12/2014  . Presence of urostomy ([place]) 08/02/2013  . Medicare annual wellness visit, subsequent 02/09/2013  . Leg edema 07/05/2012  . Radicular leg pain 12/07/2011  . Cirrhosis, cryptogenic ([place]) 07/06/2011  . Anti-Duffy a antibodies present 07/06/2011  . Duodenal nodule 07/06/2011  . Gastric varices 06/30/2011  . Gastric and duodenal angiodysplasia 06/28/2011  . Diastolic CHF ([place]) 66/44/0347  . Neoplasm of uncertain behavior of skin 07/30/2010  . COPD, severe ([place]) 07/30/2010  . Atrial fibrillation ([place]) 06/23/2010  . Pancytopenia ([place]) 11/28/2007  . Iron deficiency anemia 04/03/2007  . INGUINAL HERNIA, LEFT 04/03/2007  . HEMORRHOIDS, INTERNAL 10/26/2006  . Controlled diabetes mellitus type 2 with complications ([place]) 42/59/5638  . HLD (hyperlipidemia) 07/14/2006  . Essential hypertension 07/14/2006  . AORTIC INSUFFICIENCY 07/14/2006    Palliative Care Assessment & Plan   Patient Profile: 84 y.o. male  with past medical history of cryptongenic cirrhosis (w/ history of hypoalbuminemia), DM2, iron deficiency anemia, pancytopenia (presumed d/t cirrhosis), bladder cancer s/p cystectomy (very remote), HTN, severe COPD, Gastric and duodenal hyperdysplasia, a. Fib, CHF, CKD III, admitted on 05/30/2017 weakness, bradycardia, hypotension. Workup found him to be in acute on chronic renal failure- ?from dehydration r/t hemorrhagic gastritis discovered on  endoscopy as well as septic shock d/t pneumonia. Renal u/s showed ascites. He has required  CRRT- is off now and attempts are being made at intermittent dialysis. He is currently on HF nasal cannula with FIO2 at 35%. Chest xray shows ongoing pulmonary edema. Palliative medicine consulted for "GOC- Progressive decline despite treatment".    Recommendations/Plan:  DNR/DNI at patient's request  Continue to treat the treatable  According to family patient will be discharged home when stable with HHPT and the necessary equipment w/[facility] (hospital bed and hoyer lift).   Tums twice a day PRN for indigestion.   CM referral for outpatient palliative services. Family is not interested in hospice services at this time.    Palliative Medicine team will continue to support patient, family, and medical team during hospitalization as needed.   Goals of Care and Additional Recommendations:  Limitations on Scope of Treatment: Full Scope Treatment  Code Status:    Code Status Orders  (From admission, onward)        Start     Ordered   06/07/17 1603  Do not attempt resuscitation (DNR)  Continuous    Question Answer Comment  In the event of cardiac or respiratory ARREST Do not call a "code blue"   In the event of cardiac or respiratory ARREST Do not perform Intubation, CPR, defibrillation or ACLS   In the event of cardiac or respiratory ARREST Use medication by any route, position, wound care, and other measures to relive pain and suffering. May use oxygen, suction and manual treatment of airway obstruction as needed for comfort.      06/07/17 1602    Code Status History    Date Active Date Inactive Code Status Order ID Comments User Context   05/31/2017 0329 06/07/2017 1602 Full Code [id number]  Harrie Foreman, MD ED     Prognosis:   Unable to determine-guarded to poor in the setting of chronic kidney disease, acute on chronic anemia, pulmonary edema, acute hypoxemic respiratory  failure, decreased immobility, COPD, CRRT, cirrhosis, diabetes, CHF, and generalized weakness.  Discharge Planning: Home with Home Health and outpatient palliative services.    Care plan was discussed with patient, family,and bedside RN.  Thank you for allowing the Palliative Medicine Team to assist in the care of this patient.   Total Time 25 min.  Prolonged Time Billed  NO      Greater than 50%  of this time was spent counseling and coordinating care related to the above assessment and plan.  Alda Lea, NP-BC Palliative Medicine Team  Phone: [phone] Fax: [phone]  Please contact Palliative Medicine Team phone at [phone] for questions and concerns.

## 2017-06-16 NOTE — Progress Notes (Signed)
Pt ready for discharge home via EMS.  EMS called at 1800. Reviewed all d/c instructions and prescriptions as well as f/u appointments with patient and daughter.

## 2017-06-16 NOTE — Progress Notes (Deleted)
Pharmacy Electrolyte Monitoring Consult:  Pharmacy consulted to assist in monitoring and replacing electrolytes in this 84 y.o. male admitted on 05/30/2017 with Weakness  Labs:  Potassium (mmol/L)  Date Value  06/15/2017 4.7      Magnesium (mg/dL)  Date Value  06/14/2017 1.7      Phosphorus (mg/dL)  Date Value  06/14/2017 3.8    Assessment/Plan: Electrolytes WNL. Next labs in 48 hours.   Ulice Dash, PharmD Clinical Pharmacist  06/16/2017 1:28 PM

## 2017-06-16 NOTE — Discharge Summary (Signed)
[facility] NAME: Carlus Stay    MR#:  [id number]  DATE OF BIRTH:  January 03, 1934  DATE OF ADMISSION:  05/30/2017 ADMITTING PHYSICIAN: Harrie Foreman, MD  DATE OF DISCHARGE: 06/16/2017   PRIMARY CARE PHYSICIAN: Ria Bush, MD    ADMISSION DIAGNOSIS:  Melena [K92.1] Hyperkalemia [E87.5] Bradycardia [R00.1] Weakness [R53.1] Hypotension, unspecified hypotension type [I95.9] Acute renal failure, unspecified acute renal failure type ([place]) [N17.9] Gastrointestinal hemorrhage, unspecified gastrointestinal hemorrhage type [K92.2] Acute kidney injury superimposed on chronic kidney disease ([place]) [N17.9, N18.9]  DISCHARGE DIAGNOSIS:  Active Problems:   Acute kidney injury superimposed on chronic kidney disease (HCC)   Cirrhosis of liver with ascites ([place])   Palliative care by specialist   Advance care planning   SECONDARY DIAGNOSIS:   Past Medical History:  Diagnosis Date  . Arthritis   . Atrial fibrillation ([place]) 06/23/2010  . B12 deficiency   . C. difficile colitis 07/06/2011  . CHF (congestive heart failure) (HCC)    diastolic. Echo 5/10 with mild LVH, EF 38%, grade 1 diastolic dysfunction, severe left atrial enlargement, aortic sclerosis  . Chronic a-fib (HCC)    on pradaxa, declined cardioversion in past  . COPD (chronic obstructive pulmonary disease) ([place])    (Dr. Raul Del)  . 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  History of gastric varices.  . Diabetes mellitus, type 2 ([place])   . Gastric and duodenal angiodysplasia 06/28/2011  . Gastric varices   . GI bleed   . Hemorrhoids   . History of bladder cancer 1984   s/p ileal conduit  . History of ileostomy   . History of pneumonia 2012  . Hyperlipidemia   . Hypertension   . Inguinal hernia    left  . Iron deficiency anemia   . Kidney atrophy 02/2016  . Pinched nerve    back  . Pyloric stenosis    mild EGD  10/26/06  . RLL pneumonia ([place]) 01/28/2014  . Shortness of breath     HOSPITAL COURSE:   84 year old male with history of PAF, chronic diastolic heart failure, hepatic cirrhosis and diabetes who presented to the emergency room due to GI bleed and acute kidney injury.  1.   Acute on chronic respiratory failure with hypoxia/hypercapnia - secondary to CHF and also underlying COPD.  Patient was diuresed and has improved  06/14/17 morning noted to have worsening mental status and ABG showing hypercapnia with a normal pH. - Placed on BiPAP and hypercapnia has improved and so has his mental status. - now weaned down to 2 L we will continue to monitor.   - I don;t think pt will need ongoing BIPAP. - stable on 2 ltr oxyugen, which is baseline.  2. Septic shock due to pneumonia: Much improved, off antibiotics, cultures have been negative. - Patient has been adequately treated with IV antibiotics which have now been discontinued.   3. Acute Renal failure: This was due to ATN -While the patient was in ICU on vasopressors he was on CRRT but now has been weaned off of it.  He is making good urine and currently is not dialysis dependent. - cont. Oral Torsemide and diuresing well with it.  Creatinine has normalized.  Nephrology has signed off.   - renal func slightly worse- so decrease dose of Torsemide. - due to slight worse renal func- ( 1.7 creatinine) Hold torsemide for next 48 hrs and the  re-start at lower dose. - Advised to check renal func again in next 5 days with PMD.  4. GI bleed with anemia of chronic disease status post EGD showing gastritis/varices - Continue PPI. Hg. Stable.   5. COPD without signs of exacerbation - cont. Anoro-Ellipta  6 Diabetes: Continue sliding scale. BS stable.   7. Acute on chronic diastolic heart failure: improved since admission and cont. Oral Torsemide for now. - cont. Nadolol.    8. Thrombocytopenia: Platelets have been low but stable -  Continue monitoring  Physical therapy recommending short-term rehab and family in agreement.  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 So arrangements are done for d/c to home with HHA, PT, OT, RN, SW.  Also advise to have palliative care nurse to follow on d/c.  Patientsuffersfromatrial fibrillation, COPD, hemorrhoids, hypertension and diastolic heart failure [id number] degrees.Bedwedgesdonotprovideenoughelevationtoresolvebreathingissuescausepatienttorequirefrequent/immediatechangesinbodypositionwhichcannotbeachievedwithanormalbed.Hospital bedand Hoyer Liftrequested         DISCHARGE CONDITIONS:   Stable.  CONSULTS OBTAINED:  Treatment Team:  Anthonette Legato, MD  DRUG ALLERGIES:  No Known Allergies  DISCHARGE MEDICATIONS:   Allergies as of 06/16/2017   No Known Allergies     Medication List    STOP taking these medications   cloNIDine 0.2 MG tablet Commonly known as:  CATAPRES   furosemide 20 MG tablet Commonly known as:  LASIX   losartan 100 MG tablet Commonly known as:  COZAAR   triamcinolone cream 0.1 % Commonly known as:  KENALOG     TAKE these medications   acetaminophen 325 MG tablet Commonly known as:  TYLENOL Take 325 mg by mouth every 6 (six) hours as needed for mild pain or fever.   albuterol 108 (90 Base) MCG/ACT inhaler Commonly known as:  PROVENTIL HFA;VENTOLIN HFA Inhale 2 puffs into the lungs every 6 (six) hours as needed for wheezing.   ALPRAZolam 0.5 MG tablet Commonly known as:  XANAX Take 0.5 mg by mouth at bedtime as needed for anxiety (For Laser procedure).   ANORO ELLIPTA 62.5-25 MCG/INH Aepb Generic drug:  umeclidinium-vilanterol   doxazosin 8 MG tablet Commonly known as:  CARDURA TAKE 1/2 TABLET BY MOUTH TWICE A DAY   ferrous sulfate 325 (65 FE) MG  tablet Commonly known as:  FERRO-BOB Take 1 tablet (325 mg total) by mouth daily with breakfast.   glucose blood test strip Commonly known as:  ONE TOUCH ULTRA TEST Use to test sugar once daily and as needed Dx: E11.8   magnesium oxide 400 MG tablet Commonly known as:  MAG-OX Take 400 mg by mouth daily.   nadolol 20 MG tablet Commonly known as:  CORGARD Take 1 tablet (20 mg total) by mouth daily. What changed:    medication strength  how much to take   Neche One daily and as needed/ ICD code 250.00   pantoprazole 40 MG tablet Commonly known as:  PROTONIX TAKE 1 TABLET BY MOUTH ONCE DAILY   simvastatin 20 MG tablet Commonly known as:  ZOCOR TAKE 1 TABLET BY MOUTH EVERY DAY What changed:    how much to take  how to take this  when to take this   tiotropium 18 MCG inhalation capsule Commonly known as:  SPIRIVA Place 18 mcg into inhaler and inhale daily.   torsemide 10 MG tablet Commonly known as:  DEMADEX Take 1 tablet (10 mg total) by mouth daily. Start taking on:  06/04/2017  vitamin B-12 250 MCG tablet Commonly known as:  CYANOCOBALAMIN Take 250 mcg by mouth daily.            Durable Medical Equipment  (From admission, onward)        Start     Ordered   06/15/17 1211  For home use only DME Hospital bed  Once    Question Answer Comment  Patient has (list medical condition): Pneumonia, generalized weakness   The above medical condition requires: Patient requires the ability to reposition frequently   Head must be elevated greater than: 30 degrees   Bed type Semi-electric   Hoyer Lift Yes      06/15/17 1211       DISCHARGE INSTRUCTIONS:    Palliative care nurse to follow on d/c. Check creatinie with PMD in 5-6 days.  If you experience worsening of your admission symptoms, develop shortness of breath, life threatening emergency, suicidal or homicidal thoughts you must seek medical attention immediately by calling 911 or  calling your MD immediately  if symptoms less severe.  You Must read complete instructions/literature along with all the possible adverse reactions/side effects for all the Medicines you take and that have been prescribed to you. Take any new Medicines after you have completely understood and accept all the possible adverse reactions/side effects.   Please note  You were cared for by a hospitalist during your hospital stay. If you have any questions about your discharge medications or the care you received while you were in the hospital after you are discharged, you can call the unit and asked to speak with the hospitalist on call if the hospitalist that took care of you is not available. Once you are discharged, your primary care physician will handle any further medical issues. Please note that NO REFILLS for any discharge medications will be authorized once you are discharged, as it is imperative that you return to your primary care physician (or establish a relationship with a primary care physician if you do not have one) for your aftercare needs so that they can reassess your need for medications and monitor your lab values.    Today   CHIEF COMPLAINT:   Chief Complaint  Patient presents with  . Weakness    HISTORY OF PRESENT ILLNESS:  Dequann Vandervelden  is a 84 y.o. male with a known history of atrial fibrillation, COPD, hemorrhoids, hypertension and diastolic heart failure presents the emergency department complaining of weakness.  The patient's daughter states that his dramatic decline in strength started the day after his varicose vein laser ablation.  Today the patient slid out of bed.  He did not fall or hit his head.  He did not lose consciousness but felt lightheaded and weak.  The patient's primary doctor had given him a fecal occult blood test kit a few weeks ago as he had noticed the patient's blood count gradually dropping over time.  The patient has not seen any frank blood in his  stool but admits to dark-colored bowel movements.  He denies abdominal pain or hematemesis.  The patient also denies chest pain but admits to chronic left shoulder pain.  Laboratory evaluation in the emergency department revealed elevated potassium as well as significantly worsened kidney function.  The patient was also hypotensive and bradycardic which prompted the emergency department staff to call the hospitalist service for admission.    VITAL SIGNS:  Blood pressure (!) 122/52, pulse 60, temperature (!) 97.5 F (36.4 C), temperature source Oral, resp.  rate 20, height 5' 8"  (1.727 m), weight 106.9 kg (235 lb 9.6 oz), SpO2 99 %.  I/O:    Intake/Output Summary (Last 24 hours) at 06/16/2017 1518 Last data filed at 06/16/2017 1417 Gross per 24 hour  Intake 840 ml  Output 1600 ml  Net -760 ml    PHYSICAL EXAMINATION:   GENERAL:  84 y.o.-year-old obese patient lying in bed lethargic/encephalopathic.  EYES: Pupils equal, round, reactive to light and accommodation. No scleral icterus. Extraocular muscles intact.  HEENT: Head atraumatic, normocephalic. Oropharynx and nasopharynx clear.  NECK:  Supple, no jugular venous distention. No thyroid enlargement, no tenderness.  LUNGS: Normal breath sounds bilaterally, no wheezing, bibasilar rales, No rhonchi. No use of accessory muscles of respiration.  CARDIOVASCULAR: S1, S2 normal. No murmurs, rubs, or gallops.  ABDOMEN: Soft, nontender, nondistended. Bowel sounds present. No organomegaly or mass.  EXTREMITIES: No cyanosis, clubbing, + 2 edema b/l    NEUROLOGIC: Cranial nerves II through XII are intact. No focal Motor or sensory deficits b/l.  Globally weak. PSYCHIATRIC: The patient is alert and oriented x 3.  SKIN: No obvious rash, lesion, or ulcer.     DATA REVIEW:   CBC Recent Labs  Lab 06/11/17 0650  WBC 3.3*  HGB 8.0*  HCT 24.0*  PLT 112*    Chemistries  Recent Labs  Lab 06/14/17 0818  06/16/17 1401  NA 147*   < > 146*  K  5.1   < > 4.4  CL 109   < > 105  CO2 36*   < > 35*  GLUCOSE 113*   < > 157*  BUN 31*   < > 39*  CREATININE 1.41*   < > 1.72*  CALCIUM 8.7*   < > 8.9  MG 1.7  --   --    < > = values in this interval not displayed.    Cardiac Enzymes No results for input(s): TROPONINI in the last 168 hours.  Microbiology Results  Results for orders placed or performed during the hospital encounter of 05/30/17  Culture, blood (routine x 2)     Status: None   Collection Time: 05/31/17 12:05 AM  Result Value Ref Range Status   Specimen Description BLOOD RIGHT HAND  Final   Special Requests   Final    BOTTLES DRAWN AEROBIC AND ANAEROBIC Blood Culture adequate volume   Culture   Final    NO GROWTH 5 DAYS Performed at [facility], [address], [place], [place] [address]    Report Status 06/05/2017 FINAL  Final  Urine culture     Status: Abnormal   Collection Time: 05/31/17 12:05 AM  Result Value Ref Range Status   Specimen Description   Final    URINE, RANDOM Performed at [facility], [address] [place], [place] [address]    Special Requests   Final    NONE Performed at [facility], [address] [place], [place] [address]    Culture MULTIPLE SPECIES PRESENT, SUGGEST RECOLLECTION (A)  Final   Report Status 06/01/2017 FINAL  Final  Culture, blood (routine x 2)     Status: None   Collection Time: 05/31/17 12:08 AM  Result Value Ref Range Status   Specimen Description BLOOD LEFT AC  Final   Special Requests   Final    BOTTLES DRAWN AEROBIC AND ANAEROBIC Blood Culture adequate volume   Culture   Final    NO GROWTH 5 DAYS Performed at [facility],  [place], [place] [address]    Report Status 06/05/2017 FINAL  Final  MRSA PCR Screening     Status: None   Collection Time: 05/31/17  9:47 AM  Result Value Ref Range Status   MRSA by PCR NEGATIVE NEGATIVE Final    Comment:        The GeneXpert MRSA Assay (FDA approved for  NASAL specimens only), is one component of a comprehensive MRSA colonization surveillance program. It is not intended to diagnose MRSA infection nor to guide or monitor treatment for MRSA infections. Performed at [facility], [address] [place], [place] [address]   Urine Culture     Status: Abnormal   Collection Time: 06/02/17  5:27 PM  Result Value Ref Range Status   Specimen Description   Final    URINE, RANDOM Performed at [facility], [address], [place], [place] [address]    Special Requests   Final    NONE Performed at [facility], [address] [place], [place] [address]    Culture (A)  Final    <10,000 COLONIES/mL INSIGNIFICANT GROWTH Performed at [facility] [address] [place], [place] [address]    Report Status 06/03/2017 FINAL  Final    RADIOLOGY:  No results found.  EKG:   Orders placed or performed during the hospital encounter of 05/30/17  . ED EKG  . ED EKG  . EKG 12-Lead  . EKG 12-Lead  . EKG 12-Lead  . EKG 12-Lead      Management plans discussed with the patient, family and they are in agreement.  CODE STATUS: DNR.    Code Status Orders  (From admission, onward)        Start     Ordered   06/07/17 1603  Do not attempt resuscitation (DNR)  Continuous    Question Answer Comment  In the event of cardiac or respiratory ARREST Do not call a "code blue"   In the event of cardiac or respiratory ARREST Do not perform Intubation, CPR, defibrillation or ACLS   In the event of cardiac or respiratory ARREST Use medication by any route, position, wound care, and other measures to relive pain and suffering. May use oxygen, suction and manual treatment of airway obstruction as needed for comfort.      06/07/17 1602    Code Status History    Date Active Date Inactive Code Status Order ID Comments User Context   05/31/2017 0329 06/07/2017 1602 Full Code [id number]  Harrie Foreman, MD ED     Advance Directive Documentation     Most Recent Value  Type of Advance Directive  Living will, Healthcare Power of Attorney  Pre-existing out of facility DNR order (yellow form or pink MOST form)  -  "MOST" Form in Place?  -      TOTAL TIME TAKING CARE OF THIS PATIENT: 35 minutes.    Vaughan Basta M.D on 06/16/2017 at 3:18 PM  Between 7am to 6pm - Pager - [phone]  After 6pm go to [email or web address] - password [email or web address] [facility]  Office  [phone]  CC: Primary care physician; Ria Bush, MD   Note: This dictation was prepared with Dragon dictation along with smaller phrase technology. Any transcriptional errors that result from this process are unintentional.

## 2017-06-16 NOTE — Progress Notes (Signed)
SLP Cancellation Note  Patient Details Name: [name] MRN: [id number] DOB: 09-25-1933   Cancelled treatment:       Reason Eval/Treat Not Completed: SLP screened, no needs identified, will sign off(chart reviewed; consulted w/ NSG then met w/ pt/family). Pt and family denied any swallowing difficulty and that pt has greatly improved eating 100% at meals per their reports(NSG and family). Pt and family would like to have a regular diet consistency; family will assist w/ any cutting of foods/meats for pt. ST services will sign off at this time w/ NSG to reconsult if needed while pt is admitted. Pt/Dtr agreed.   Orinda Kenner, MS, CCC-SLP Watson,Katherine 06/16/2017, 11:53 AM

## 2017-06-16 NOTE — Progress Notes (Signed)
Physical Therapy Treatment Patient Details Name: [name] MRN: [id number] DOB: [birth year] Today's Date: 06/16/2017    History of Present Illness Pt admitted for AKI with complaints of weakness. According to pt, after vein surgery, pt drastically became weak and slid out of bed at home. Hospital stay complicated by CRRT and now temp IJ placed for HD. Recent admission for GI bleed and ARF. History includes CHF, Afib, COPD, DM, bladder ca, HTN.     PT Comments    Pt anticipating discharge today but ready for session.  To edge of bed with min a x 1 and increased time.  Pt was able to stand x 1 with min a x 2 and achieve full upright.  During standing, urostomy seal came loose and leaked.  Care given.  On second stand to change linens, he requires mod a x 2 to stand due to fatigue.  Stood up to 1 minute before weakness/fatigue required him to sit.  Max a x 2 to reposition in bed.  Nursing addressing urostomy seal.   Follow Up Recommendations  SNF     Equipment Recommendations  None recommended by PT    Recommendations for Other Services       Precautions / Restrictions Precautions Precautions: Fall Precaution Comments: Watch urostomy bag, R side Restrictions Weight Bearing Restrictions: No    Mobility  Bed Mobility Overal bed mobility: Needs Assistance Bed Mobility: Supine to Sit;Sit to Supine     Supine to sit: Min assist Sit to supine: Mod assist      Transfers Overall transfer level: Needs assistance Equipment used: Rolling walker (2 wheeled) Transfers: Sit to/from Stand Sit to Stand: Min assist;Mod assist;+2 physical assistance         General transfer comment: increases assist on second attempt due to fatigue  Ambulation/Gait             General Gait Details: Unable   Stairs             Wheelchair Mobility    Modified Rankin (Stroke Patients Only)       Balance Overall balance assessment: Needs assistance Sitting-balance support: Feet  supported Sitting balance-Leahy Scale: Good     Standing balance support: Bilateral upper extremity supported Standing balance-Leahy Scale: Fair Standing balance comment: no LOB or bucking but unable to step and fatigues easily                            Cognition Arousal/Alertness: Awake/alert Behavior During Therapy: WFL for tasks assessed/performed Overall Cognitive Status: Within Functional Limits for tasks assessed                                        Exercises      General Comments        Pertinent Vitals/Pain Pain Assessment: No/denies pain    Home Living                      Prior Function            PT Goals (current goals can now be found in the care plan section) Progress towards PT goals: Progressing toward goals    Frequency    Min 2X/week      PT Plan Current plan remains appropriate    Co-evaluation  AM-PAC PT "6 Clicks" Daily Activity  Outcome Measure  Difficulty turning over in bed (including adjusting bedclothes, sheets and blankets)?: Unable Difficulty moving from lying on back to sitting on the side of the bed? : Unable Difficulty sitting down on and standing up from a chair with arms (e.g., wheelchair, bedside commode, etc,.)?: Unable Help needed moving to and from a bed to chair (including a wheelchair)?: A Lot Help needed walking in hospital room?: Total Help needed climbing 3-5 steps with a railing? : Total 6 Click Score: 7    End of Session Equipment Utilized During Treatment: Gait belt;Oxygen Activity Tolerance: Patient tolerated treatment well;Patient limited by fatigue Patient left: in bed;with bed alarm set;with call bell/phone within reach;with family/visitor present Nurse Communication: Other (comment)       Time: 7703-4035 PT Time Calculation (min) (ACUTE ONLY): 23 min  Charges:  $Therapeutic Activity: 23-37 mins                    G CodesChesley Noon, PTA 06/16/17, 4:26 PM

## 2017-06-16 NOTE — Care Management (Signed)
Advanced in to speak with daughter, [name].

## 2017-06-16 NOTE — Care Management Note (Signed)
Case Management Note  Patient Details  Name: [name] MRN: [id number] Date of Birth: 1933/10/25  Subjective/Objective:   Advanced notified of discharge.                Action/Plan:    Expected Discharge Date:                  Expected Discharge Plan:  [place]  In-House Referral:     Discharge planning Services  CM Consult  Post Acute Care Choice:  Home Health, Durable Medical Equipment Choice offered to:  Adult Children  DME Arranged:  Hospital bed(hoyer lift) DME Agency:  [place]:  RN, PT, OT, Nurse's Aide, Social Work [employer] Agency:  [place]  Status of Service:  Completed, signed off  If discussed at [employer], dates discussed:    Additional Comments:  Jolly Mango, RN 06/16/2017, 2:59 PM

## 2017-06-16 NOTE — Care Management Note (Signed)
Case Management Note  Patient Details  Name: [name] MRN: [id number] Date of Birth: Feb 07, 1933  Subjective/Objective:  Met with patient and his daughter, [name] at bedside. Daughter is well prepared to take patient home. She will be staying with her parents during the day while her other sisters take turns staying at night. A hospital bed and hoyer lift has been ordered. No other DME needed per the daughter. Patient will need to transport home by EMS. Daughter and patient pleasant. Awaiting MD for disposition.                  Action/Plan:   Expected Discharge Date:                  Expected Discharge Plan:  [place]  In-House Referral:     Discharge planning Services  CM Consult  Post Acute Care Choice:  Home Health, Durable Medical Equipment Choice offered to:  Adult Children  DME Arranged:  Hospital bed(hoyer lift) DME Agency:  [place] Arranged:  RN, PT, OT, Nurse's Aide, Social Work [employer] Agency:  [place]  Status of Service:  In process, will continue to follow  If discussed at Long Length of Stay Meetings, dates discussed:    Additional Comments:  Jolly Mango, RN 06/16/2017, 12:08 PM

## 2017-06-16 NOTE — Care Management (Signed)
Daughter received call that hospital bed and hoyer lift could not be delivered until tomorrow. Daughter states patient will be fine tonight until the bed and lift arrives tomorrow. Primary nurse updated and will call EMS when daughter is ready.

## 2017-06-17 ENCOUNTER — Other Ambulatory Visit: Payer: Self-pay | Admitting: Pharmacist

## 2017-06-17 ENCOUNTER — Other Ambulatory Visit: Payer: Self-pay

## 2017-06-17 ENCOUNTER — Encounter: Payer: Self-pay | Admitting: *Deleted

## 2017-06-17 ENCOUNTER — Other Ambulatory Visit: Payer: Self-pay | Admitting: *Deleted

## 2017-06-17 DIAGNOSIS — J449 Chronic obstructive pulmonary disease, unspecified: Secondary | ICD-10-CM | POA: Diagnosis not present

## 2017-06-17 DIAGNOSIS — K7460 Unspecified cirrhosis of liver: Secondary | ICD-10-CM | POA: Diagnosis not present

## 2017-06-17 DIAGNOSIS — J9690 Respiratory failure, unspecified, unspecified whether with hypoxia or hypercapnia: Secondary | ICD-10-CM | POA: Diagnosis not present

## 2017-06-17 DIAGNOSIS — N179 Acute kidney failure, unspecified: Secondary | ICD-10-CM | POA: Diagnosis not present

## 2017-06-17 NOTE — Patient Outreach (Signed)
[facility] [facility]) Care Management  06/17/2017  [name] 01-03-1934 [id number]  BSW received social work referral from hospital liaison for transportation assistance on 5/22, patient not discharged until 06/16/17 at 6:30 pm. Upon chart review it appears patients healthcare POA has spoken to [name], [name] as well as [facility] Pharmacist, [name], on today's date. BSW spoke with [name], [name], who indicated patients next physician appointment is scheduled for 6/3. BSW will plan to contact patients healthcare POA early next week.    [name], BSW, CDP Social Worker Cell # [phone] [name]

## 2017-06-17 NOTE — Patient Outreach (Signed)
[facility]) Care Management  06/17/2017  [name] 01-Mar-1933 [id number]  84 year old male referred to [place] [facility] by [facility] inpatient liaison after recent hospitalization for acute kidney injury 2/2 ATN and GIB.  [place] services requested for medication assistance.  PMHx includes, but not limited to, atrial fibrillation, congestive heart failure (EF 55-60% 5/'19), liver cirrhosis with ascites, gastric varices, thrombocytopenia, chronic kidney disease, COPD, anemia, type 2 diabetes mellitus, recent laser ablation of bilateral great saphenous veins, and history of bladder cancer with urostomy in place. Patient's insurance denied SNF stay and patient discharged home with home health services including RN, PT, OT, Nurse's Aide, and SW.  Referral placed for outpatient palliative services as family is not interested in hospice services at this time.   Successful call placed to Mr. [name] family today.  I spoke with patient's daughter, [name].  HIPAA identifiers verified. [name] verbally agreeable to [facility] services.  She states that the hospital bed and lift have not been delivered yet but are scheduled to arrive today.    Objective:  Hemoglobin A1C: 4.9 (3/26/'19) Fructosamine: 262 (3/26/'19) SCr = 1.72 (06/16/17)  Medications Reviewed Today    Reviewed by Rudean Haskell, RPH (Pharmacist) on 06/17/17 at 1011  Med List Status: <None>  Medication Order Taking? Sig Documenting Provider Last Dose Status Informant  acetaminophen (TYLENOL) 325 MG tablet [id number] Yes Take 325 mg by mouth every 6 (six) hours as needed for mild pain or fever.  [provider] Taking Active Child  albuterol (PROVENTIL HFA;VENTOLIN HFA) 108 (90 Base) MCG/ACT inhaler [id number] Yes Inhale 2 puffs into the lungs every 6 (six) hours as needed for wheezing. [provider] Taking Active Child  Celedonio Miyamoto 62.5-25 MCG/INH AEPB [id number] No  [provider] Not Taking  Active Child  doxazosin (CARDURA) 8 MG tablet [id number] Yes TAKE 1/2 TABLET BY MOUTH TWICE A Velora Heckler, MD Taking Active   ferrous sulfate (FERRO-BOB) 325 (65 FE) MG tablet [id number] Yes Take 1 tablet (325 mg total) by mouth daily with breakfast. Debbe Odea, MD Taking Active Child           Med Note [name], Michael   Tue May 31, 2017  1:13 AM)    glucose blood (ONE TOUCH ULTRA TEST) test strip [id number] Yes Use to test sugar once daily and as needed Dx: E11.8 Ria Bush, MD Taking Active Child  magnesium oxide (MAG-OX) 400 MG tablet [id number] Yes Take 400 mg by mouth daily. [provider] Taking Active Child  nadolol (CORGARD) 20 MG tablet [id number] Yes Take 1 tablet (20 mg total) by mouth daily. Vaughan Basta, MD Taking Active   ONE [facility] LANCETS [place] [id number] Yes One daily and as needed/ ICD code 250.00  [provider] Taking Active Child  pantoprazole (PROTONIX) 40 MG tablet [id number] Yes TAKE 1 TABLET BY MOUTH ONCE DAILY Ria Bush, MD Taking Active Child  simvastatin (ZOCOR) 20 MG tablet [id number] Yes TAKE 1 TABLET BY MOUTH EVERY DAY  Patient taking differently:  TAKE 1 TABLET BY MOUTH EVERY NIGHT   Ria Bush, MD Taking Active Child  tiotropium [facility]) 18 MCG inhalation capsule [id number] Yes Place 18 mcg into inhaler and inhale daily. [provider] Taking Active Child  torsemide (DEMADEX) 10 MG tablet [id number] Yes Take 1 tablet (10 mg total) by mouth daily. Vaughan Basta, MD Taking Active   vitamin B-12 (CYANOCOBALAMIN) 250 MCG tablet [id number] Yes Take 250 mcg by mouth daily. [provider] Taking Active Child           ASSESSMENT: Date Discharged from Hospital: 06/17/2017 Date Medication Reconciliation Performed: 06/17/17  Patient was recently discharged from hospital and all medications have been reviewed.  Medications Discontinued at  Discharge:  Clonidine  Furosemide  Losartan  Triamcinolone cream  Medications Changed at Discharge:   Nadolol  New Medications at Discharge:   Torsemide (start 06/23/2017)  Drugs sorted by system:  Cardiovascular: Nadolol, simvastatin, torsemide  Pulmonary/Allergy: albuterol inhaler, Anoro Ellipta (Umeclidinium and Vilanterol), Spiriva (Tiotropium)  Gastrointestinal: Ferrous sulfate, pantoprazole  Pain: acetaminophen  Vitamins/Minerals: magnesium oxide, vitamin B12  Miscellaneous: doxazosin  Renal dosing: no issues Gaps in therapy: CHF:  No ACE/ARB due to recent AKI Duplications in therapy:   Anora Ellipta and Spiriva: Duplicate inhaled anticholinergic agents.  Patient taking both in the hospital but only using Spiriva at home due to cost.  I will contact PCP and pulmonary provider to clarify therapy.  Medications to avoid in the elderly:     Doxazosin: Per Beers criteria this medication has been identified as potentially inappropriate in patients 65 years and older due to its high risk of orthostatic hypotension. Avoid use for hypertension treatment (alternative agents have superior risk-benefit profiles). Per Beers list, avoiding this medication in the elderly with syncope is recommended since it increases risk of orthostatic hypotension or bradycardia. Elderly patients may be at increased risk for adverse reactions than younger patients. Per Beers criteria, avoid use of this medication in women with urinary incontinence  Daughter states patient has not seen urologist in years and is not sure if this is current medication.  I will contact PCP to clarify if patient needs to continue taking this medication.    Drug interactions: none  Other issues noted:  Medication Assistance: TROOP = $371.90 thus far.   Daughter states that inhaler therapy is expensive for patient, especially after he enters into the coverage gap.  Co-pay is $45 / 30 DS.    TROOP = $371.90 thus  far.     Coverage gap starts after $1136.63 spent between plan and patient on medications.    Based on stated income between patient and spouse, patient is not eligible for Extra Help.    Patient may qualify for Spiriva PAP (income over limit but we can attempt to appeal for financial hardship) and Proventil PAP.   I counseled daughter that [facility] offers 80 day supply of medications for 2 co-pays instead of 3 for additional cost savings if PAPs are not approved.  Daughter agreeable to apply for Spiriva is provider verifies this is correct inhaler.    Medication therapy:   Patient has nebulizer machine but does not have prescription for albuterol nebulizers.  He may benefit from having this on hand in case of emergencies when he is unable to utilize the albuterol inhaler.  Daughter would like to have this on hand as well.  I will request new prescription from PCP / pulmonary provider.   Alprazolam removed from medication list as this was a one-time order to take prior to vein procedure.   Medication changes at discharge:  Family unaware of any medication changes after hospitalization.  We reviewed all medications and changes from AVS including discontinued medications, lowered dose of nadolol, and new RX for torsemide (1st dose 06/17/2017).  Daughter voiced understanding and appreciation.     We reviewed importance of weights with heart failure and having a plan with diuretics if weight increases.  Daughter states she  will assist with weights as able as right now patient may not be able to step on scale.     Plan: I will contact providers regarding issues above and update patient and family with information.  If Spiriva confirmed, we can apply for PAP through [employer] and Proventil through [employer].    Ralene Bathe, PharmD, [place] [phone]

## 2017-06-17 NOTE — Patient Outreach (Signed)
[facility]) [place] PCP completes transition of care outreach post-hospital discharge [place] x 2 Hospital discharge day 1  06/17/2017  GEORG ANG 07/05/1933 [id number]  Successful telephone outreach to [name], HCPOA/ daughter/ caregiver of [name], 84 y/o male referred to [facility] [place] after recent extended hospitalization May 6-23, 2019 for weakness, GI bleeding, acute kidney injury, bradycardia, hypotension, ARF secondary to CHF exacerbation.  Patient has history including, but not limited to, DM; HTN/ HLD; Aortic insufficiency; Atrial Fibrillation; COPD; CHF; and cirrhosis.  Short-term SNF placement was recommended post-hospitalization, however, this was unfortunately denies by patient's insurance provider; patient was discharged home with home health services ordered for RN, PT, bath aide, and DME of hospital bed and hoyer lift.  HIPAA/ identity verified with patient's daughter/ caregiver today; patient previously provided verbal permission on 06/15/17 for [place] team to speak with his daughter [name].  Today, [name] reports that patient and his family "are overwhelmed" with care after his discharge from the hospital yesterday evening; stated that home health agency has still not delivered DME for hospital bed and hoyer lift; [name] expresses disappointment that his insurance plan denied short-term rehabilitation SNF visit, stating that patient "remains weak after his long hospitalization," and is unable to provide any care for himself.  States that she and her siblings are alternating staying with patient so that he has care 'around the clock," and "hopefully no one person in the family gets exhausted."  [name] reports that patient's wife is also elderly and is unable/ limited in her ability to provide care for patient without assistance from other family  members.  [name] denies that patient is in pain, "mainly just so weak."  Reports that patient requires assistance with all of his toileting/ ADL/ feeding/ medication needs.  Denies that patient is in distress, but states that he "didn't have a very good night" last night, since he was discharged home from the hospital prior to having ordered DME arrive at his home; reports that patient "needs the hospital bed," and states that patient "woke [place] up several times during the night because he felt like he couldn't breathe."  Reports that she "did her best" to elevate patient's head and adjusted his home O2, and reports that patient "got by and seemed better" after doing this several times throughout the night.  Caregiver further reports:  Medications: -- Has all medicationsand takes as prescribed;confirms that she spoke with [place] earlier today at which time post-hospital medication review was completed; [name] verbalized great appreciation for this, stating that "no one told me that they had adjusted/ changed his medications" at the time of hospital discharge. -- denies questions about current medications, since she spoke with [facility] Pharmacist earlier today, "everything got straightened out" after her conversation with Jaclyn Shaggy -- family manages/ administers all patient medications using weekly pill planner box. -- denies issues with swallowing medications  Home health [facility]) services: -- [place] services for PT, OT, RN in place through [place]; reports has not yet heard from agency for initiation of services; "hoping they call soon;" provided and confirmed that caregiver has phone number for [facility] agency, and coached her in services to inquire about; caregiver agrees to contact home health agency "right after we hang up" to obtain information on initiation of ordered services -- [facility] DME for hospital bed and hoyer lift have still not arrived; states she has heard from  DME staff, and is  expecting them "sometime this afternoon."  Encouraged her to contact [facility] agency if DME does not arrive promptly, and she agreed to do so   Provider appointments: -- All upcoming provider appointments were reviewed with patient's caregiver today-- caregiver reports they "would like to say" that patient will attend all provider appointments, "but can't say that," as patient is too weak to walk to their vehicles for them to transport  Safety/ Mobility/ Falls: -- denies new/ recent falls, however, reports that patient "has had several close calls, where he slides off bed;" denies injuries with these episodes, but states "it could happen at any time," given patient's 'weak state" after his extended hospitalization -- assistive devices: used to use cane and walker for ambulation; "can no longer use" due to weakness after extended hospitalization; reports that family "does their best" to transfer patient into wheelchair from bed post-hospital discharge; patient currently bed-wheelchair bound post-hospital discharge -- general fall risks/ prevention education discussed with patient's caregiver today  Social/ Community Resource needs: -- as stated above, supportive family members that assist with care needs as indicated, with patient's adult children alternating staying at his home to provide "care around the clock." -- family previously provided transportation for patient to all provider appointments, errands, etc, however, caregiver reports this is no longer possible with patient's weakened/ debilitated state post-extended hospitalization -- reports "can use all the help we can get" with care assistance at home -- discussed with caregiver that [place] referral has been placed and will be in contact with caregiver soon to discuss needs-- caregiver verbalizes agreement -- states "unsure" if palliative care referral was placed at time of hospital discharge  Advanced Directive (AD) Planning:   --reports  recently created AD for HCPOA, living will, and DNR in place, and denies desire to make changes; confirms that patient has DNR form posted at residence.    Self-health management of chronic disease state of CHF: -- states that patient "used to monitor and record daily weight" but can no longer safely do, due to his debilitated state post-hospital discharge; states 'trying to weigh him would be a huge safety and fall risk." -- uses home O2 at 2 L/min; has had home O2 "for years;" reports patient experiencing intermittent episodes shortness of breath, which are resolved with rest, increasing O2 for 5 minutes, raising his head with pillows, since we don't have a hospital bed yet."  Caregiver confirms that she "hopes" patient doesn't have ongoing episodes shortness of breath at home, but also adds that she "wouldn't be surprised if he did" given his weakened state post-hospital discharge; reports "patient breathing okay right now."  Patient's caregiver denies further issues, concerns, or problems today, and we agreed that she should call home health agency promptly to establish services-- encouraged caregiver to re-contact me if necessary for assistance, and she agreed to do so.  I provided/ confirmed that patient has my direct phone number, the main [facility] CM office phone number, and the [facility] CM 24-hour nurse advice phone number should issues arise prior to next scheduled [place] outreach by phone next week.  Encouraged patient to contact me directly if needs, questions, issues, or concerns arise prior to next scheduled outreach; patient agreed to do so.  1:10: Care Coordination Outreach:  Contacted [place] by phone to make her aware of caregiver's report of patient's current barriers around level of care needs and transportation-- Tillie Rung confirmed that she will outreach to patient early  next week, as I advised that caregiver may be unavailable by phone this afternoon due to managing multiple  care needs around inititaion of home health services  2:05 pm Care Coordination Outreach: was contacted by Rutherford Limerick, [facility] Liaison, confirming that patient had been set up with services through [facility] post-hospital discharge; discussed services with Janci, and notified [facility] BSW of our conversation, and janci's confirmation that this service is in place as of time of hospital discharge.  Plan:  Patient will take medications as prescribed and attend all scheduled provider appointments post-hospital discharge  Patient's caregiver will promptly contact home health agency in place for DME needs and care services, and will outreach if assistance in facilitation of same is needed  I will make patient's PCP aware of [place] CM involvement in patient's care post-hospital discharge- will send barriers letter  [place] outreach to continue with scheduled telephone call next week  [facility] CM Care Plan Problem One     Most Recent Value  Care Plan Problem One  High Risk for hospital readmission related to/ as evidenced by patient recently discharged from extended hospitalization May 6-23, 2019 for CHF exacerbation with insurance denial of short term SNF placement for rehabilitation  Role Documenting the Problem One  Care Management Coordinator  Care Plan for Problem One  Active  [facility] Long Term Goal   Patient will not experience hospital readmission over the next 31 days, as evidenced by caregiver reporting and review of EMR during [place] RN CM outreach  [facility] Long Term Goal Start Date  06/17/17  Interventions for Problem One Long Term Goal  Discussed with patient's caregiver his level of care needs, insurance denial of short-term SNF placement for rehabilitation, current home health DME and services/ care assistance needs, lack of family's ability to transport patient safely to provider appointments,  care coordination outreach placed to [facility] BSW regarding  transportation and level of care needs  [facility] CM Short Term Goal #1   Over the next 7 days, patient's caregiver will communicate with [facility] BSW regarding patient's transportation needs post-hospital discharge  [facility] CM Short Term Goal #1 Start Date  06/17/17  Interventions for Short Term Goal #1  Discussed with caregiver patient's need for reliable and safe transportation needs in patient with recent extended hospitalization,  placed care coordination outreach to [place] regarding same  [facility] CM Short Term Goal #2   Over the next 7 days, patient's caregiver will be able to verbalize plan for home health services in place for patient post-hospital discharge, as evidenced by caregiver reporting and collaboration with home health agency as indicated, during [place] outreach  [facility] CM Short Term Goal #2 Start Date  06/17/17  Interventions for Short Term Goal #2  Discussed with patient's caregiver currently ordered home health services post-hospital discharge,  provided caregiver with telephone number for home health agency, and confirmed that she is expecting patient's DME to be delivered today,  coached caregiver on action plan to take to ensure that home health services arrive promptly post-hospital discharge     I appreciate the opportunity to participate in Mr. [place] care,  Oneta Rack, RN, BSN, [employer] Coordinator [facility] Care Management  [phone]

## 2017-06-18 ENCOUNTER — Encounter: Payer: Self-pay | Admitting: Emergency Medicine

## 2017-06-18 ENCOUNTER — Inpatient Hospital Stay
Admission: EM | Admit: 2017-06-18 | Discharge: 2017-06-25 | DRG: 871 | Disposition: E | Payer: PPO | Attending: Internal Medicine | Admitting: Internal Medicine

## 2017-06-18 ENCOUNTER — Other Ambulatory Visit: Payer: Self-pay | Admitting: Family Medicine

## 2017-06-18 ENCOUNTER — Other Ambulatory Visit: Payer: Self-pay

## 2017-06-18 ENCOUNTER — Emergency Department: Payer: PPO

## 2017-06-18 DIAGNOSIS — R531 Weakness: Secondary | ICD-10-CM

## 2017-06-18 DIAGNOSIS — N390 Urinary tract infection, site not specified: Secondary | ICD-10-CM | POA: Diagnosis not present

## 2017-06-18 DIAGNOSIS — Z8701 Personal history of pneumonia (recurrent): Secondary | ICD-10-CM

## 2017-06-18 DIAGNOSIS — G9341 Metabolic encephalopathy: Secondary | ICD-10-CM | POA: Diagnosis present

## 2017-06-18 DIAGNOSIS — E538 Deficiency of other specified B group vitamins: Secondary | ICD-10-CM | POA: Diagnosis not present

## 2017-06-18 DIAGNOSIS — R001 Bradycardia, unspecified: Secondary | ICD-10-CM

## 2017-06-18 DIAGNOSIS — R627 Adult failure to thrive: Secondary | ICD-10-CM | POA: Diagnosis not present

## 2017-06-18 DIAGNOSIS — I130 Hypertensive heart and chronic kidney disease with heart failure and stage 1 through stage 4 chronic kidney disease, or unspecified chronic kidney disease: Secondary | ICD-10-CM | POA: Diagnosis present

## 2017-06-18 DIAGNOSIS — E1122 Type 2 diabetes mellitus with diabetic chronic kidney disease: Secondary | ICD-10-CM | POA: Diagnosis present

## 2017-06-18 DIAGNOSIS — J9601 Acute respiratory failure with hypoxia: Secondary | ICD-10-CM | POA: Diagnosis not present

## 2017-06-18 DIAGNOSIS — I959 Hypotension, unspecified: Secondary | ICD-10-CM | POA: Diagnosis not present

## 2017-06-18 DIAGNOSIS — L8990 Pressure ulcer of unspecified site, unspecified stage: Secondary | ICD-10-CM

## 2017-06-18 DIAGNOSIS — I509 Heart failure, unspecified: Secondary | ICD-10-CM | POA: Diagnosis not present

## 2017-06-18 DIAGNOSIS — N179 Acute kidney failure, unspecified: Secondary | ICD-10-CM | POA: Diagnosis present

## 2017-06-18 DIAGNOSIS — Z515 Encounter for palliative care: Secondary | ICD-10-CM | POA: Diagnosis not present

## 2017-06-18 DIAGNOSIS — Z932 Ileostomy status: Secondary | ICD-10-CM

## 2017-06-18 DIAGNOSIS — M1990 Unspecified osteoarthritis, unspecified site: Secondary | ICD-10-CM | POA: Diagnosis present

## 2017-06-18 DIAGNOSIS — I482 Chronic atrial fibrillation: Secondary | ICD-10-CM | POA: Diagnosis not present

## 2017-06-18 DIAGNOSIS — K7469 Other cirrhosis of liver: Secondary | ICD-10-CM | POA: Diagnosis not present

## 2017-06-18 DIAGNOSIS — J9600 Acute respiratory failure, unspecified whether with hypoxia or hypercapnia: Secondary | ICD-10-CM

## 2017-06-18 DIAGNOSIS — Z8551 Personal history of malignant neoplasm of bladder: Secondary | ICD-10-CM

## 2017-06-18 DIAGNOSIS — R6520 Severe sepsis without septic shock: Secondary | ICD-10-CM | POA: Diagnosis present

## 2017-06-18 DIAGNOSIS — N189 Chronic kidney disease, unspecified: Secondary | ICD-10-CM | POA: Diagnosis present

## 2017-06-18 DIAGNOSIS — Z87891 Personal history of nicotine dependence: Secondary | ICD-10-CM

## 2017-06-18 DIAGNOSIS — J449 Chronic obstructive pulmonary disease, unspecified: Secondary | ICD-10-CM | POA: Diagnosis present

## 2017-06-18 DIAGNOSIS — Z85828 Personal history of other malignant neoplasm of skin: Secondary | ICD-10-CM

## 2017-06-18 DIAGNOSIS — D509 Iron deficiency anemia, unspecified: Secondary | ICD-10-CM | POA: Diagnosis not present

## 2017-06-18 DIAGNOSIS — Z8249 Family history of ischemic heart disease and other diseases of the circulatory system: Secondary | ICD-10-CM

## 2017-06-18 DIAGNOSIS — E785 Hyperlipidemia, unspecified: Secondary | ICD-10-CM | POA: Diagnosis present

## 2017-06-18 DIAGNOSIS — Z833 Family history of diabetes mellitus: Secondary | ICD-10-CM

## 2017-06-18 DIAGNOSIS — A419 Sepsis, unspecified organism: Secondary | ICD-10-CM | POA: Diagnosis not present

## 2017-06-18 DIAGNOSIS — Z66 Do not resuscitate: Secondary | ICD-10-CM | POA: Diagnosis not present

## 2017-06-18 DIAGNOSIS — J90 Pleural effusion, not elsewhere classified: Secondary | ICD-10-CM | POA: Diagnosis not present

## 2017-06-18 DIAGNOSIS — Z79899 Other long term (current) drug therapy: Secondary | ICD-10-CM

## 2017-06-18 LAB — CBC WITH DIFFERENTIAL/PLATELET
BASOS PCT: 0 %
Basophils Absolute: 0 10*3/uL (ref 0–0.1)
EOS ABS: 0 10*3/uL (ref 0–0.7)
Eosinophils Relative: 1 %
HEMATOCRIT: 21.3 % — AB (ref 40.0–52.0)
HEMOGLOBIN: 7 g/dL — AB (ref 13.0–18.0)
Lymphocytes Relative: 15 %
Lymphs Abs: 0.5 10*3/uL — ABNORMAL LOW (ref 1.0–3.6)
MCH: 32 pg (ref 26.0–34.0)
MCHC: 33 g/dL (ref 32.0–36.0)
MCV: 96.7 fL (ref 80.0–100.0)
MONOS PCT: 9 %
Monocytes Absolute: 0.3 10*3/uL (ref 0.2–1.0)
NEUTROS ABS: 2.6 10*3/uL (ref 1.4–6.5)
NEUTROS PCT: 75 %
Platelets: 111 10*3/uL — ABNORMAL LOW (ref 150–440)
RBC: 2.2 MIL/uL — AB (ref 4.40–5.90)
RDW: 20.2 % — ABNORMAL HIGH (ref 11.5–14.5)
WBC: 3.5 10*3/uL — AB (ref 3.8–10.6)

## 2017-06-18 LAB — COMPREHENSIVE METABOLIC PANEL
ALBUMIN: 2.2 g/dL — AB (ref 3.5–5.0)
ALK PHOS: 278 U/L — AB (ref 38–126)
ALT: 100 U/L — AB (ref 17–63)
ANION GAP: 6 (ref 5–15)
AST: 611 U/L — ABNORMAL HIGH (ref 15–41)
BUN: 52 mg/dL — AB (ref 6–20)
CALCIUM: 8.3 mg/dL — AB (ref 8.9–10.3)
CO2: 32 mmol/L (ref 22–32)
CREATININE: 3.14 mg/dL — AB (ref 0.61–1.24)
Chloride: 105 mmol/L (ref 101–111)
GFR calc Af Amer: 20 mL/min — ABNORMAL LOW (ref >60)
GFR calc non Af Amer: 17 mL/min — ABNORMAL LOW (ref >60)
GLUCOSE: 96 mg/dL (ref 65–99)
Potassium: 5.4 mmol/L — ABNORMAL HIGH (ref 3.5–5.1)
SODIUM: 143 mmol/L (ref 135–145)
Total Bilirubin: 2.2 mg/dL — ABNORMAL HIGH (ref 0.3–1.2)
Total Protein: 5.5 g/dL — ABNORMAL LOW (ref 6.5–8.1)

## 2017-06-18 LAB — LACTIC ACID, PLASMA: Lactic Acid, Venous: 1.6 mmol/L (ref 0.5–1.9)

## 2017-06-18 LAB — TROPONIN I: Troponin I: <0.03 ng/mL (ref <0.03)

## 2017-06-18 LAB — URINALYSIS, ROUTINE W REFLEX MICROSCOPIC
Bilirubin Urine: NEGATIVE
GLUCOSE, UA: NEGATIVE mg/dL
Ketones, ur: NEGATIVE mg/dL
NITRITE: NEGATIVE
PH: 7 (ref 5.0–8.0)
Protein, ur: 100 mg/dL — AB
RBC / HPF: >50 RBC/hpf — ABNORMAL HIGH (ref 0–5)
SPECIFIC GRAVITY, URINE: 1.02 (ref 1.005–1.030)
Squamous Epithelial / LPF: NONE SEEN (ref 0–5)
WBC, UA: >50 WBC/hpf — ABNORMAL HIGH (ref 0–5)

## 2017-06-18 LAB — GLUCOSE, CAPILLARY: Glucose-Capillary: 100 mg/dL — ABNORMAL HIGH (ref 65–99)

## 2017-06-18 LAB — TYPE AND SCREEN
ABO/RH(D): O POS
Antibody Screen: POSITIVE

## 2017-06-18 MED ORDER — ATROPINE SULFATE 1 MG/10ML IJ SOSY
0.5000 mg | PREFILLED_SYRINGE | Freq: Once | INTRAMUSCULAR | Status: AC
  Administered 2017-06-18: 0.5 mg via INTRAVENOUS

## 2017-06-18 MED ORDER — HYDROCORTISONE NA SUCCINATE PF 100 MG IJ SOLR
100.0000 mg | Freq: Once | INTRAMUSCULAR | Status: AC
  Administered 2017-06-18: 100 mg via INTRAVENOUS
  Filled 2017-06-18: qty 2

## 2017-06-18 MED ORDER — PIPERACILLIN-TAZOBACTAM 3.375 G IVPB 30 MIN
3.3750 g | Freq: Once | INTRAVENOUS | Status: AC
  Administered 2017-06-18: 3.375 g via INTRAVENOUS
  Filled 2017-06-18: qty 50

## 2017-06-18 MED ORDER — MORPHINE SULFATE (PF) 2 MG/ML IV SOLN
2.0000 mg | INTRAVENOUS | Status: DC | PRN
  Administered 2017-06-18 – 2017-06-19 (×2): 2 mg via INTRAVENOUS
  Filled 2017-06-18 (×2): qty 1

## 2017-06-18 MED ORDER — SODIUM CHLORIDE 0.9 % IV SOLN: 10.0000 mL/h | Freq: Once | INTRAVENOUS | Status: DC

## 2017-06-18 MED ORDER — SODIUM CHLORIDE 0.9 % IV BOLUS
1000.0000 mL | Freq: Once | INTRAVENOUS | Status: AC
  Administered 2017-06-18: 1000 mL via INTRAVENOUS

## 2017-06-18 MED ORDER — PIPERACILLIN-TAZOBACTAM 3.375 G IVPB: 3.3750 g | Freq: Three times a day (TID) | INTRAVENOUS | Status: DC

## 2017-06-18 MED ORDER — ORAL CARE MOUTH RINSE
15.0000 mL | Freq: Two times a day (BID) | OROMUCOSAL | Status: DC
  Administered 2017-06-18 – 2017-06-19 (×2): 15 mL via OROMUCOSAL

## 2017-06-18 MED ORDER — SODIUM CHLORIDE 0.9 % IV SOLN
1250.0000 mg | INTRAVENOUS | Status: DC
  Filled 2017-06-18: qty 1250

## 2017-06-18 MED ORDER — VANCOMYCIN HCL IN DEXTROSE 1-5 GM/200ML-% IV SOLN: 1000.0000 mg | Freq: Once | INTRAVENOUS | Status: DC

## 2017-06-18 MED ORDER — SODIUM CHLORIDE 0.9 % IV SOLN
INTRAVENOUS | Status: DC
  Administered 2017-06-18: 16:00:00 via INTRAVENOUS

## 2017-06-18 MED ORDER — ATROPINE SULFATE 1 MG/10ML IJ SOSY
PREFILLED_SYRINGE | INTRAMUSCULAR | Status: AC
  Administered 2017-06-18: 0.5 mg via INTRAVENOUS
  Filled 2017-06-18: qty 10

## 2017-06-18 MED ORDER — IPRATROPIUM-ALBUTEROL 0.5-2.5 (3) MG/3ML IN SOLN
3.0000 mL | Freq: Four times a day (QID) | RESPIRATORY_TRACT | Status: DC | PRN
  Administered 2017-06-19: 3 mL via RESPIRATORY_TRACT
  Filled 2017-06-18: qty 3

## 2017-06-18 MED ORDER — IPRATROPIUM-ALBUTEROL 0.5-2.5 (3) MG/3ML IN SOLN
3.0000 mL | Freq: Four times a day (QID) | RESPIRATORY_TRACT | Status: DC
  Administered 2017-06-18: 3 mL via RESPIRATORY_TRACT
  Filled 2017-06-18: qty 3

## 2017-06-18 MED ORDER — ACETAMINOPHEN 325 MG PO TABS: 650.0000 mg | ORAL_TABLET | Freq: Four times a day (QID) | ORAL | Status: DC | PRN

## 2017-06-18 MED ORDER — ONDANSETRON HCL 4 MG/2ML IJ SOLN: 4.0000 mg | Freq: Four times a day (QID) | INTRAMUSCULAR | Status: DC | PRN

## 2017-06-18 MED ORDER — FAMOTIDINE IN NACL 20-0.9 MG/50ML-% IV SOLN
20.0000 mg | Freq: Two times a day (BID) | INTRAVENOUS | Status: DC
  Administered 2017-06-18 (×2): 20 mg via INTRAVENOUS
  Filled 2017-06-18 (×2): qty 50

## 2017-06-18 MED ORDER — ONDANSETRON HCL 4 MG PO TABS: 4.0000 mg | ORAL_TABLET | Freq: Four times a day (QID) | ORAL | Status: DC | PRN

## 2017-06-18 MED ORDER — ACETAMINOPHEN 650 MG RE SUPP: 650.0000 mg | Freq: Four times a day (QID) | RECTAL | Status: DC | PRN

## 2017-06-18 NOTE — Progress Notes (Signed)
Chaplain responded to page @ 15:15 to provide care to a patient, and his large extended family, who was designated as comfort care. Chaplain provided prayer, a ministry of presence, grief support, and helped facilitate a discussion among relatives about the patient's spiritual condition. Chaplain suggested that intercessory prayer and emotional support would be most helpful, considering the patient's condition. A family pastor arrived and Chaplain transferred care to him. Chaplain offered to return, if needed.

## 2017-06-18 NOTE — Progress Notes (Signed)
Pharmacy Antibiotic Note  JAHAD OLD is a 84 y.o. male admitted on 06/04/2017 with sepsis.  Pharmacy has been consulted for vancomycin and zosyn dosing.  Plan: Vancomycin 1250mg  IV every 24 hours.  Goal trough 15-20 mcg/mL. Zosyn 3.375g IV q8h (4 hour infusion).  Height: 5\' 8"  (172.7 cm) Weight: 235 lb (106.6 kg) IBW/kg (Calculated) : 68.4  Temp (24hrs), Avg:97.4 F (36.3 C), Min:97.4 F (36.3 C), Max:97.4 F (36.3 C)  Recent Labs  Lab 06/13/17 0604 06/14/17 0818 06/15/17 0504 06/16/17 1401 06/09/2017 1203  WBC  --   --   --   --  3.5*  CREATININE 1.03 1.41* 1.51* 1.72* 3.14*    Estimated Creatinine Clearance: 21.8 mL/min (A) (by C-G formula based on SCr of 3.14 mg/dL (H)).    No Known Allergies  Antimicrobials this admission: Anti-infectives (From admission, onward)   Start     Dose/Rate Route Frequency Ordered Stop   06/08/2017 2100  piperacillin-tazobactam (ZOSYN) IVPB 3.375 g     3.375 g 12.5 mL/hr over 240 Minutes Intravenous Every 8 hours 06/12/2017 1320     06/17/2017 1900  vancomycin (VANCOCIN) 1,250 mg in sodium chloride 0.9 % 250 mL IVPB     1,250 mg 166.7 mL/hr over 90 Minutes Intravenous Every 24 hours 06/17/2017 1322     05/28/2017 1330  piperacillin-tazobactam (ZOSYN) IVPB 3.375 g     3.375 g 100 mL/hr over 30 Minutes Intravenous  Once 05/29/2017 1316     05/27/2017 1330  vancomycin (VANCOCIN) IVPB 1000 mg/200 mL premix     1,000 mg 200 mL/hr over 60 Minutes Intravenous  Once 06/03/2017 1316         Microbiology results: No results found for this or any previous visit (from the past 240 hour(s)).   Thank you for allowing pharmacy to be a part of this patient's care.  Donna Christen Oluwanifemi Petitti 06/07/2017 1:22 PM

## 2017-06-18 NOTE — Plan of Care (Signed)
Pt admitted from the ED on comfort care. Morphine given once for pain with improvement. Family at the bedside.

## 2017-06-18 NOTE — ED Provider Notes (Signed)
[facility] Emergency Department Provider Note  Time seen: 12:08 PM  I have reviewed the triage vital signs and the nursing notes.   HISTORY  Chief Complaint Weakness    HPI [name] is a 84 y.o. male with a significant past medical history including CHF, atrial fibrillation on Pradaxa, COPD, cirrhosis, diabetes, history of GI bleed, ileostomy secondary to bladder cancer, hypertension, hyperlipidemia presents to the emergency department with generalized weakness and unresponsiveness.  According to family this morning the patient was very weak, briefly went unresponsive so they called EMS to bring the patient to the emergency department.  Patient's blood pressure upon arrival is 78/48 with a heart rate of 43.  Patient is awake alert, able to answer questions but has very slow responses.  Patient denies any chest pain, abdominal pain.  States weakness and mild shortness of breath he says largely unchanged from when he was discharged from the hospital 2 days ago.  Patient has no acute complaints besides just feeling weak.   Past Medical History:  Diagnosis Date  . Arthritis   . Atrial fibrillation ([place]) 06/23/2010  . B12 deficiency   . C. difficile colitis 07/06/2011  . CHF (congestive heart failure) (HCC)    diastolic. Echo 5/10 with mild LVH, EF 85%, grade 1 diastolic dysfunction, severe left atrial enlargement, aortic sclerosis  . Chronic a-fib (HCC)    on pradaxa, declined cardioversion in past  . COPD (chronic obstructive pulmonary disease) ([place])    (Dr. Raul Del)  . Cryptogenic cirrhosis ([place])    Followed by Dr Fuller Plan, never biopsied, has been stable.  History of gastric varices.  . Diabetes mellitus, type 2 ([place])   . Gastric and duodenal angiodysplasia 06/28/2011  . Gastric varices   . GI bleed   . Hemorrhoids   . History of bladder cancer 1984   s/p ileal conduit  . History of ileostomy   . History of pneumonia 2012  . Hyperlipidemia   . Hypertension    . Inguinal hernia    left  . Iron deficiency anemia   . Kidney atrophy 02/2016  . Pinched nerve    back  . Pyloric stenosis    mild EGD 10/26/06  . RLL pneumonia ([place]) 01/28/2014  . Shortness of breath     Patient Active Problem List   Diagnosis Date Noted  . Cirrhosis of liver with ascites ([place])   . Palliative care by specialist   . Advance care planning   . Acute kidney injury superimposed on chronic kidney disease ([place]) 05/31/2017  . Varicose veins of both legs with edema 11/26/2016  . Bilateral leg weakness 10/15/2016  . Cold intolerance 12/16/2015  . Generalized weakness 09/09/2015  . Health maintenance examination 03/12/2015  . Psoriasis 09/09/2014  . Advanced care planning/counseling discussion 02/12/2014  . Presence of urostomy ([place]) 08/02/2013  . Medicare annual wellness visit, subsequent 02/09/2013  . Leg edema 07/05/2012  . Radicular leg pain 12/07/2011  . Cirrhosis, cryptogenic ([place]) 07/06/2011  . Anti-Duffy a antibodies present 07/06/2011  . Duodenal nodule 07/06/2011  . Gastric varices 06/30/2011  . Gastric and duodenal angiodysplasia 06/28/2011  . Diastolic CHF ([place]) 46/27/0350  . Neoplasm of uncertain behavior of skin 07/30/2010  . COPD, severe ([place]) 07/30/2010  . Atrial fibrillation ([place]) 06/23/2010  . Pancytopenia ([place]) 11/28/2007  . Iron deficiency anemia 04/03/2007  . INGUINAL HERNIA, LEFT 04/03/2007  . HEMORRHOIDS, INTERNAL 10/26/2006  . Controlled diabetes mellitus type 2 with complications ([place]) 09/38/1829  . HLD (  hyperlipidemia) 07/14/2006  . Essential hypertension 07/14/2006  . AORTIC INSUFFICIENCY 07/14/2006    Past Surgical History:  Procedure Laterality Date  . BASAL CELL CARCINOMA EXCISION  2019   multiple sites  . BE  08/08/2003  . BLADDER REMOVAL     s/p urostomy bag  . COLONOSCOPY  07/24/2003   Int. hemorrhoids  . COLONOSCOPY  10/26/2006   Int hemorrhoids, 10 yrs  . ESOPHAGOGASTRODUODENOSCOPY  10/26/2006   Gastric varices,  pyloric stenosis  . ESOPHAGOGASTRODUODENOSCOPY  07/01/2011   Procedure: ESOPHAGOGASTRODUODENOSCOPY (EGD);  Surgeon: Jerene Bears, MD;  Location: [place];  Service: Gastroenterology;  Laterality: N/A;  . ESOPHAGOGASTRODUODENOSCOPY  06/28/2011   Procedure: ESOPHAGOGASTRODUODENOSCOPY (EGD);  Surgeon: Jerene Bears, MD;  Location: [place];  Service: Gastroenterology;  Laterality: N/A;  . ESOPHAGOGASTRODUODENOSCOPY (EGD) WITH PROPOFOL N/A 06/01/2017   Procedure: ESOPHAGOGASTRODUODENOSCOPY (EGD) WITH PROPOFOL;  Surgeon: [name], MD;  Location: [facility] ENDOSCOPY;  Service: Gastroenterology;  Laterality: N/A;  . FETAL BLOOD TRANSFUSION    . ILEOSTOMY  1984   bladder cancer  . PENILE PROSTHESIS IMPLANT  1990   inflatable implant  . PROSTATECTOMY  1984   bladder cancer  . US ECHOCARDIOGRAPHY  05/1995   EF 60% TR, AI    Prior to Admission medications   Medication Sig Start Date End Date Taking? Authorizing Provider  acetaminophen (TYLENOL) 325 MG tablet Take 325 mg by mouth every 6 (six) hours as needed for mild pain or fever.     [provider]  albuterol (PROVENTIL HFA;VENTOLIN HFA) 108 (90 Base) MCG/ACT inhaler Inhale 2 puffs into the lungs every 6 (six) hours as needed for wheezing.    [provider]  doxazosin (CARDURA) 8 MG tablet TAKE 1/2 TABLET BY MOUTH TWICE A DAY 06/08/17   Ria Bush, MD  ferrous sulfate (FERRO-BOB) 325 (65 FE) MG tablet Take 1 tablet (325 mg total) by mouth daily with breakfast. 07/06/11   Debbe Odea, MD  glucose blood (ONE TOUCH ULTRA TEST) test strip Use to test sugar once daily and as needed Dx: E11.8 03/15/17   Ria Bush, MD  magnesium oxide (MAG-OX) 400 MG tablet Take 400 mg by mouth daily.    [provider]  nadolol (CORGARD) 20 MG tablet Take 1 tablet (20 mg total) by mouth daily. 06/16/17   Vaughan Basta, MD  ONE TOUCH LANCETS MISC One daily and as needed/ ICD code 250.00     [provider]   pantoprazole (PROTONIX) 40 MG tablet TAKE 1 TABLET BY MOUTH ONCE DAILY 05/09/17   Ria Bush, MD  simvastatin (ZOCOR) 20 MG tablet TAKE 1 TABLET BY MOUTH EVERY DAY Patient taking differently: TAKE 1 TABLET BY MOUTH EVERY NIGHT 03/25/17   Ria Bush, MD  tiotropium (SPIRIVA) 18 MCG inhalation capsule Place 18 mcg into inhaler and inhale daily.    [provider]  torsemide (DEMADEX) 10 MG tablet Take 1 tablet (10 mg total) by mouth daily. 06/13/2017   Vaughan Basta, MD  vitamin B-12 (CYANOCOBALAMIN) 250 MCG tablet Take 250 mcg by mouth daily.    [provider]    No Known Allergies  Family History  Problem Relation Age of Onset  . Hypertension Mother   . Osteoarthritis Mother   . Heart disease Father        MI  . Coronary artery disease Sister   . Diabetes Brother   . Heart disease Brother        MI  . Coronary artery disease Sister   .  Stroke Other        GPs  . Colon cancer Neg Hx     Social History Social History   Tobacco Use  . Smoking status: Former Smoker    Packs/day: 1.00    Years: 45.00    Pack years: 45.00    Types: Cigarettes    Last attempt to quit: 06/22/1989    Years since quitting: 28.0  . Smokeless tobacco: Never Used  . Tobacco comment: Quit 1990  Substance Use Topics  . Alcohol use: No    Alcohol/week: 0.6 oz    Types: 1 Standard drinks or equivalent per week  . Drug use: No    Review of Systems Constitutional: Negative for fever. Eyes: Negative for visual complaints ENT: Negative for recent illness/congestion Cardiovascular: Negative for chest pain. Respiratory: Positive for shortness of breath, unchanged from discharge. Gastrointestinal: Negative for abdominal pain Genitourinary: Urostomy, but decreased output over the past 2 days. Musculoskeletal: Swelling in arms and legs, ongoing since admission. Skin: Negative for skin complaints  Neurological: Negative for headache All other ROS  negative  ____________________________________________   PHYSICAL EXAM:  VITAL SIGNS: ED Triage Vitals [06/06/2017 1205]  Enc Vitals Group     BP (!) 78/40     Pulse Rate (!) 43     Resp (!) 30     Temp (!) 97.4 F (36.3 C)     Temp src      SpO2 96 %     Weight      Height      Head Circumference      Peak Flow      Pain Score      Pain Loc      Pain Edu?      Excl. in [place]?    Constitutional: Patient is alert, he is able to answer questions but is very slow responses. Eyes: Normal exam ENT   Head: Normocephalic and atraumatic.   Mouth/Throat: Mucous membranes are moist. Cardiovascular: Patient is quite bradycardic around 40 bpm. Respiratory: Mild tachypnea.  No obvious wheeze rales or rhonchi. Gastrointestinal: Soft and nontender. No distention.  Musculoskeletal: Fairly diffuse edema in arms and legs. Neurologic: Slow speech, normal language, given his significant weakness unable to perform an adequate neurological exam Skin:  Skin is warm, dry and intact.  Pale in color. Psychiatric: Mood and affect are normal.   ____________________________________________    EKG  EKG reviewed and interpreted by myself appears to show very slow atrial fibrillation room 47 bpm, narrow QRS, right axis deviation, prolonged QTC, nonspecific ST changes.  ____________________________________________    [id number]  X-ray most consistent with vascular congestion.  ____________________________________________   INITIAL IMPRESSION / ASSESSMENT AND PLAN / ED COURSE  Pertinent labs & imaging results that were available during my care of the patient were reviewed by me and considered in my medical decision making (see chart for details).  Patient presents to the emergency department with weakness and unresponsiveness.  I reviewed the patient's records they are significant for the following from his recent admission: Melena [K92.1] Hyperkalemia [E87.5] Bradycardia  [R00.1] Weakness [R53.1] Hypotension, unspecified hypotension type [I95.9] Acute renal failure, unspecified acute renal failure type ([place]) [N17.9] Gastrointestinal hemorrhage, unspecified gastrointestinal hemorrhage type [K92.2] Acute kidney injury superimposed on chronic kidney disease ([place]) [N17.9, N18.9]  Patient was discharged from the hospital 24 to 48 hours ago.  Family states since going home the patient has been extremely weak.  Patient does not ambulate at baseline and had been using  a wheelchair.  Family states the patient has not produced any urine.  Patient is a DNR status.  Differential is quite broad at this time but would include renal failure, CHF exacerbation, sepsis, significant anemia.  We will check broad labs, began with IV hydration although the patient required dialysis for diuresis during his recent admission.  Overall very grim prognosis for the patient.  I discussed this with family.  Discussed with the patient, with family in the room to ensure that the patient wanted to maintain a DO NOT RESUSCITATE.  Patient states he does not want compressions or breathing tube.  I also asked about doing pressors to artificially increase his blood pressure, patient states he does not want this either.  I discussed this with the family and they state these are the same wishes that he expressed while he was in the hospital last time.  Patient CBC is resulted with a hemoglobin of 7.0 I have ordered 1 unit of emergent blood for the patient.  I also dose 0.5 mg of atropine given his bradycardia with a rate around 40 bpm.  Confirmed with the patient he is agreeable to receiving blood products.  I have ordered 1 unit of emergency release blood.  Patient has significant antibodies, we will wait for typed blood products.  Patient's labs have resulted otherwise showing a white blood cell count of 3.5, patient's creatinine is 3.14, liver enzymes are elevated.  We will cover with broad-spectrum  antibiotics until a more clear clinical picture is obtained.  Urinalysis is pending.  I discussed the patient with the intensive care physician as well as the hospitalist.  Will be admitting to the ICU for further treatment.   CRITICAL CARE Performed by: Harvest Dark   Total critical care time: 60 minutes  Critical care time was exclusive of separately billable procedures and treating other patients.  Critical care was necessary to treat or prevent imminent or life-threatening deterioration.  Critical care was time spent personally by me on the following activities: development of treatment plan with patient and/or surrogate as well as nursing, discussions with consultants, evaluation of patient's response to treatment, examination of patient, obtaining history from patient or surrogate, ordering and performing treatments and interventions, ordering and review of laboratory studies, ordering and review of radiographic studies, pulse oximetry and re-evaluation of patient's condition.     ____________________________________________   FINAL CLINICAL IMPRESSION(S) / ED DIAGNOSES  Weakness Bradycardia Hypotension CHF exacerbation Anemia   Harvest Dark, MD 06/04/2017 1326

## 2017-06-18 NOTE — H&P (Signed)
History and Physical    [name] MCN:[id number] DOB: May 20, 1933 DOA: 05/26/2017  Referring physician: Dr. Kerman Passey PCP: Ria Bush, MD  Specialists: none  Chief Complaint: unresponsive  HPI: [name] is a 84 y.o. male has a past medical history significant for A-fib, CHF, COPD, and DM recently discharged who now presents to ER after becoming "unresponsive" at home. In ER, pt in acute respiratory failure requiring BiPAP. Aslo in ARF with hypotension and bradycardia. He is not verbally responsive. Family is present. He is "DNR" per family. After a long discussion with multiple family members, they have opted for comfort care. They want him off BiPAP with no invasive procedures. There defer blood transfusion as well.  Review of Systems: unable to obtain due to non-verbal status  Past Medical History:  Diagnosis Date  . Arthritis   . Atrial fibrillation ([place]) 06/23/2010  . B12 deficiency   . C. difficile colitis 07/06/2011  . CHF (congestive heart failure) (HCC)    diastolic. Echo 5/10 with mild LVH, EF 62%, grade 1 diastolic dysfunction, severe left atrial enlargement, aortic sclerosis  . Chronic a-fib (HCC)    on pradaxa, declined cardioversion in past  . COPD (chronic obstructive pulmonary disease) ([place])    (Dr. Raul Del)  . Cryptogenic cirrhosis ([place])    Followed by Dr Fuller Plan, never biopsied, has been stable.  History of gastric varices.  . Diabetes mellitus, type 2 ([place])   . Gastric and duodenal angiodysplasia 06/28/2011  . Gastric varices   . GI bleed   . Hemorrhoids   . History of bladder cancer 1984   s/p ileal conduit  . History of ileostomy   . History of pneumonia 2012  . Hyperlipidemia   . Hypertension   . Inguinal hernia    left  . Iron deficiency anemia   . Kidney atrophy 02/2016  . Pinched nerve    back  . Pyloric stenosis    mild EGD 10/26/06  . RLL pneumonia ([place]) 01/28/2014  . Shortness of breath    Past Surgical History:  Procedure Laterality  Date  . BASAL CELL CARCINOMA EXCISION  2019   multiple sites  . BE  08/08/2003  . BLADDER REMOVAL     s/p urostomy bag  . COLONOSCOPY  07/24/2003   Int. hemorrhoids  . COLONOSCOPY  10/26/2006   Int hemorrhoids, 10 yrs  . ESOPHAGOGASTRODUODENOSCOPY  10/26/2006   Gastric varices, pyloric stenosis  . ESOPHAGOGASTRODUODENOSCOPY  07/01/2011   Procedure: ESOPHAGOGASTRODUODENOSCOPY (EGD);  Surgeon: Jerene Bears, MD;  Location: [place];  Service: Gastroenterology;  Laterality: N/A;  . ESOPHAGOGASTRODUODENOSCOPY  06/28/2011   Procedure: ESOPHAGOGASTRODUODENOSCOPY (EGD);  Surgeon: Jerene Bears, MD;  Location: [place];  Service: Gastroenterology;  Laterality: N/A;  . ESOPHAGOGASTRODUODENOSCOPY (EGD) WITH PROPOFOL N/A 06/01/2017   Procedure: ESOPHAGOGASTRODUODENOSCOPY (EGD) WITH PROPOFOL;  Surgeon: [name], MD;  [facility] ENDOSCOPY;  Service: Gastroenterology;  Laterality: N/A;  . FETAL BLOOD TRANSFUSION    . ILEOSTOMY  1984   bladder cancer  . PENILE PROSTHESIS IMPLANT  1990   inflatable implant  . PROSTATECTOMY  1984   bladder cancer  . US ECHOCARDIOGRAPHY  05/1995   EF 60% TR, AI   Social History:  reports that he quit smoking about 28 years ago. His smoking use included cigarettes. He has a 45.00 pack-year smoking history. He has never used smokeless tobacco. He reports that he does not drink alcohol or use drugs.  No Known Allergies  Family History  Problem  Relation Age of Onset  . Hypertension Mother   . Osteoarthritis Mother   . Heart disease Father        MI  . Coronary artery disease Sister   . Diabetes Brother   . Heart disease Brother        MI  . Coronary artery disease Sister   . Stroke Other        GPs  . Colon cancer Neg Hx     Prior to Admission medications   Medication Sig Start Date End Date Taking? Authorizing Provider  acetaminophen (TYLENOL) 325 MG tablet Take 325 mg by mouth every 6 (six) hours as needed for mild pain or fever.    Yes [provider]  albuterol (PROVENTIL HFA;VENTOLIN HFA) 108 (90 Base) MCG/ACT inhaler Inhale 2 puffs into the lungs every 6 (six) hours as needed for wheezing.   Yes [provider]  doxazosin (CARDURA) 8 MG tablet TAKE 1/2 TABLET BY MOUTH TWICE A DAY 06/08/17  Yes Ria Bush, MD  ferrous sulfate (FERRO-BOB) 325 (65 FE) MG tablet Take 1 tablet (325 mg total) by mouth daily with breakfast. 07/06/11  Yes Debbe Odea, MD  glucose blood (ONE TOUCH ULTRA TEST) test strip Use to test sugar once daily and as needed Dx: E11.8 03/15/17  Yes Ria Bush, MD  magnesium oxide (MAG-OX) 400 MG tablet Take 400 mg by mouth daily.   Yes [provider]  nadolol (CORGARD) 20 MG tablet Take 1 tablet (20 mg total) by mouth daily. Patient taking differently: Take 40 mg by mouth daily.  06/16/17  Yes Vaughan Basta, MD  pantoprazole (PROTONIX) 40 MG tablet TAKE 1 TABLET BY MOUTH ONCE DAILY 05/09/17  Yes Ria Bush, MD  potassium chloride (K-DUR,KLOR-CON) 10 MEQ tablet Take 10 mEq by mouth daily.   Yes [provider]  simvastatin (ZOCOR) 20 MG tablet TAKE 1 TABLET BY MOUTH EVERY DAY Patient taking differently: TAKE 1 TABLET BY MOUTH EVERY NIGHT 03/25/17  Yes Ria Bush, MD  tiotropium (SPIRIVA) 18 MCG inhalation capsule Place 18 mcg into inhaler and inhale daily.   Yes [provider]  torsemide (DEMADEX) 10 MG tablet Take 1 tablet (10 mg total) by mouth daily. 05/29/2017  Yes Vaughan Basta, MD  umeclidinium-vilanterol (ANORO ELLIPTA) 62.5-25 MCG/INH AEPB Inhale 1 puff into the lungs daily.   Yes [provider]  vitamin B-12 (CYANOCOBALAMIN) 250 MCG tablet Take 250 mcg by mouth daily.   Yes [provider]   Physical Exam: Vitals:   06/24/2017 1233 06/09/2017 1240 06/17/2017 1241 06/05/2017 1300  BP: (!) 83/50 (!) 75/32  (!) 74/34  Pulse: (!) 54 (!) 53 (!) 52 (!) 52  Resp: 14 14 (!) 0 14  Temp:      SpO2: 97% 98% 96% 96%  Weight:       Height:         General:  Acutely ill appearing in moderate distress, [place]/AT, WDWN  Eyes: PERRL, EOMI, no scleral icterus, conjunctiva pale  ENT: dry oropharynx without exudate, TM's benign, dentition poor  Neck: supple, no lymphadenopathy. No bruits or thyromegaly  Cardiovascular: slow rate with regular rhythm without MRG; 2+ peripheral pulses, no JVD, 2+ peripheral edema  Respiratory: bilateral rales without wheezes or rhonchi. No dullnes. Respiratory effort increased  Abdomen: soft, non tender to palpation, positive bowel sounds, no guarding, no rebound  Skin: no rashes or lesions  Musculoskeletal: normal bulk and tone, no joint swelling  Psychiatric: somnolent, non-verbal  Neurologic: CN  2-12 grossly intact, Motor strength 5/5 in all 4 groups with symmetric DTR's and non-focal sensory exam  Labs on Admission:  Basic Metabolic Panel: Recent Labs  Lab 06/12/17 0636 06/13/17 0604 06/14/17 0818 06/15/17 0504 06/16/17 1401 06/07/2017 1203  NA 144 145 147* 147* 146* 143  K 4.5 4.5 5.1 4.7 4.4 5.4*  CL 107 107 109 107 105 105  CO2 34* 31 36* 34* 35* 32  GLUCOSE 106* 97 113* 92 157* 96  BUN 26* 27* 31* 35* 39* 52*  CREATININE 1.23 1.03 1.41* 1.51* 1.72* 3.14*  CALCIUM 8.3* 8.4* 8.7* 8.7* 8.9 8.3*  MG  --  1.8 1.7  --   --   --   PHOS 2.4*  --  3.8  --   --   --    Liver Function Tests: Recent Labs  Lab 06/12/17 0636 06/16/2017 1203  AST  --  611*  ALT  --  100*  ALKPHOS  --  278*  BILITOT  --  2.2*  PROT  --  5.5*  ALBUMIN 2.2* 2.2*   No results for input(s): LIPASE, AMYLASE in the last 168 hours. No results for input(s): AMMONIA in the last 168 hours. CBC: Recent Labs  Lab 06/07/2017 1203  WBC 3.5*  NEUTROABS 2.6  HGB 7.0*  HCT 21.3*  MCV 96.7  PLT 111*   Cardiac Enzymes: Recent Labs  Lab 06/02/2017 1203  TROPONINI <0.03    BNP (last 3 results) Recent Labs    05/31/17 0008 06/08/17 0422  BNP 487.0* 668.0*    ProBNP (last 3 results) No  results for input(s): PROBNP in the last 8760 hours.  CBG: Recent Labs  Lab 06/15/17 1656 06/15/17 2116 06/16/17 0738 06/16/17 1153 06/21/2017 1159  GLUCAP 113* 106* 88 121* 100*    Radiological Exams on Admission: Dg Chest Portable 1 View  Result Date: 05/29/2017 CLINICAL DATA:  Altered mental status EXAM: PORTABLE CHEST 1 VIEW COMPARISON:  06/08/2017 FINDINGS: Thorax is rotated to the right. The heart is upper normal in size. There is apparent pleural based opacity at the right apex which may be related to vasculature and rotation of the thorax. Stable right pleural effusion. Left jugular dialysis catheter is been removed. Vascular congestion. No Kerley B lines. IMPRESSION: Vascular congestion without definite pulmonary edema. Right pleural effusion. Opacity at the right apex may be related to vasculature and rotation of the thorax. PA and lateral upright chest radiograph series is recommended when feasible. Electronically Signed   By: Marybelle Killings M.D.   On: 06/08/2017 12:29    EKG: Independently reviewed.  Assessment/Plan Principal Problem:   Acute respiratory failure (HCC) Active Problems:   Acute renal failure (ARF) (HCC)   Bradycardia   Hypotension   Will admit to floor as DNR with comfort care. Consult Palliative care. IV fluids. IV morphine as needed. O2 per [place]  Diet: NPO Fluids: NS@75  DVT Prophylaxis: none  Code Status: DNR  Family Communication: yes  Disposition Plan: Hospice  Time spent: 50 min

## 2017-06-18 NOTE — ED Triage Notes (Signed)
Hypotensive on arrival 79/29, alert. bs 100

## 2017-06-18 NOTE — ED Triage Notes (Signed)
Pt seen 2 days ago for weakness and crf. Weak today and hypotensive for ems

## 2017-06-18 NOTE — ED Notes (Signed)
Pt became apneic and hr dropped to 35. With strong sternal rub pt took a breath and stated he was ok.  Atropine ordered and given. Pt is a dnr and does not want a central line. Pt is now on bipap

## 2017-06-18 NOTE — ED Notes (Signed)
Pt made hospice. Orders will be discontinued

## 2017-06-18 NOTE — Progress Notes (Signed)
Family requesting that pt have some comfort foods such as ice cream, made family aware of risk of aspiration, despite education family insist on comfort feeding

## 2017-06-19 DIAGNOSIS — L8990 Pressure ulcer of unspecified site, unspecified stage: Secondary | ICD-10-CM

## 2017-06-19 LAB — URINE CULTURE: Culture: NO GROWTH

## 2017-06-19 MED ORDER — SCOPOLAMINE 1 MG/3DAYS TD PT72: 1.0000 | MEDICATED_PATCH | TRANSDERMAL | 12 refills | Status: AC

## 2017-06-19 MED ORDER — DIPHENHYDRAMINE HCL 50 MG/ML IJ SOLN
25.0000 mg | Freq: Once | INTRAMUSCULAR | Status: AC
  Administered 2017-06-19: 25 mg via INTRAVENOUS
  Filled 2017-06-19: qty 0.5

## 2017-06-19 MED ORDER — MORPHINE SULFATE (CONCENTRATE) 10 MG/0.5ML PO SOLN: 10.0000 mg | ORAL | 0 refills | Status: AC | PRN

## 2017-06-19 MED ORDER — LORAZEPAM 2 MG/ML IJ SOLN: 0.5000 mg | Freq: Four times a day (QID) | INTRAMUSCULAR | Status: DC | PRN

## 2017-06-19 MED ORDER — SCOPOLAMINE 1 MG/3DAYS TD PT72
1.0000 | MEDICATED_PATCH | TRANSDERMAL | Status: DC
  Filled 2017-06-19: qty 1

## 2017-06-19 MED ORDER — LORAZEPAM 0.5 MG PO TABS: 0.5000 mg | ORAL_TABLET | Freq: Four times a day (QID) | ORAL | 0 refills | Status: AC | PRN

## 2017-06-19 MED ORDER — DIPHENHYDRAMINE HCL 25 MG PO CAPS: 50.0000 mg | ORAL_CAPSULE | Freq: Once | ORAL | Status: DC

## 2017-06-19 MED ORDER — MORPHINE SULFATE (CONCENTRATE) 10 MG/0.5ML PO SOLN: 10.0000 mg | ORAL | Status: DC | PRN

## 2017-06-19 MED ORDER — LORAZEPAM 0.5 MG PO TABS: 0.5000 mg | ORAL_TABLET | Freq: Four times a day (QID) | ORAL | Status: DC | PRN

## 2017-06-19 MED ORDER — MORPHINE SULFATE (PF) 4 MG/ML IV SOLN
4.0000 mg | INTRAVENOUS | Status: DC | PRN
  Administered 2017-06-19 (×2): 4 mg via INTRAVENOUS
  Filled 2017-06-19 (×2): qty 1

## 2017-06-20 LAB — PREPARE RBC (CROSSMATCH)

## 2017-06-21 ENCOUNTER — Ambulatory Visit: Payer: Self-pay

## 2017-06-21 ENCOUNTER — Other Ambulatory Visit: Payer: Self-pay | Admitting: *Deleted

## 2017-06-21 ENCOUNTER — Other Ambulatory Visit: Payer: Self-pay | Admitting: Pharmacist

## 2017-06-21 ENCOUNTER — Other Ambulatory Visit: Payer: Self-pay

## 2017-06-21 ENCOUNTER — Telehealth: Payer: Self-pay | Admitting: *Deleted

## 2017-06-21 NOTE — Telephone Encounter (Signed)
Lm requesting return call to complete TCM and confirm hosp f/u appt  

## 2017-06-21 NOTE — Patient Outreach (Signed)
[facility]) Care Management  06/21/2017  [name] April 06, 1933 [id number]  BSW reviewed patient chart to follow up on referral for community resources. Patient returned to ED over the weekend and expired. Resolving SWK episode. BSW notified other members of [facility] Management.  [name], BSW, CDP Social Worker Cell # [phone] [email or web address]

## 2017-06-21 NOTE — Patient Outreach (Signed)
[facility]) Care Management [facility] CM Case Closure 06/21/2017  [name] 20-Apr-1933 [id number]  [facility] CM Case Closure; re: [name], 84 y/o male referred to [facility] after recent extended hospitalization May 6-23, 2019 for weakness, GI bleeding, acute kidney injury, bradycardia, hypotension, ARF secondary to CHF exacerbation.  Patient has history including, but not limited to, DM; HTN/ HLD; Aortic insufficiency; Atrial Fibrillation; COPD; CHF; and cirrhosis.  Short-term SNF placement was recommended post-hospitalization, however, this was unfortunately denies by patient's insurance provider; patient was discharged home with home health services ordered for RN, PT, bath aide, and DME of hospital bed and hoyer lift.    Noted through review of EMR that patient presented to ED on 06/17/2017 and was re-admitted for acute respiratory failure, hypoxia and bradycardia; during hospital admission, hospice at home was ordered for patient, however during hospitalization, [name] deceased.  Notified [name] of patient's deceased status; will close [name] case  Oneta Rack, RN, BSN, [place] Coordinator [facility] Care Management  [phone]

## 2017-06-21 NOTE — Patient Outreach (Signed)
[facility]) Care Management  06/21/2017  [place] Quinter Lesh 1933/09/05 [id number]   Per review of CHL and notification from [place] team, patient has passed away.  Will close [facility] case.   Ralene Bathe, PharmD, [place] [phone]   '

## 2017-06-22 ENCOUNTER — Ambulatory Visit: Payer: Self-pay | Admitting: *Deleted

## 2017-06-22 ENCOUNTER — Telehealth: Payer: Self-pay | Admitting: Family Medicine

## 2017-06-22 NOTE — Telephone Encounter (Signed)
Dr Darnell Level I called to reschedule pt appointment on Monday.  His daughter answered phone stated Mr Primo passed away on Jul 16, 2022

## 2017-06-22 NOTE — Telephone Encounter (Signed)
Noted. See my phone note from yesterday/earlier today.

## 2017-06-22 NOTE — Telephone Encounter (Signed)
Pt passed away 5/26 in hospital.  Called daughter and expressed my condolences.  plz cancel appts here. Thank you.

## 2017-06-23 LAB — CULTURE, BLOOD (ROUTINE X 2)
CULTURE: NO GROWTH
Culture: NO GROWTH
Special Requests: ADEQUATE
Special Requests: ADEQUATE

## 2017-06-25 NOTE — Progress Notes (Signed)
SVN given at RN request

## 2017-06-25 NOTE — Progress Notes (Signed)
Post mortem care performed. Awaiting funeral home for transportation.

## 2017-06-25 NOTE — Progress Notes (Signed)
   11-Jul-2017 1007  Clinical Encounter Type  Visited With Patient and family together  Visit Type Follow-up;Death  Referral From Nurse  Consult/Referral To Chaplain  Spiritual Encounters  Spiritual Needs Emotional;Grief support   Chaplain responded to page regarding patient death.  Chaplain arrived to find family at the bedside.  Conversation around family described 'ease of patient's passing.'  Family beginning life review of patient.  Chaplain spoke with patient's spouse who shared that today was patient's birthday.  They sang happy birthday to him at midnight.  Spouse also spoke of death of daughter 8 years ago in June.  Chaplain offered to pray with family but they reported that they were still waiting for others to arrive.  Chaplain offered a prayer shawl to spouse to which she agreed.  Family spoke of patient's love of [place] and [place] football.  Chaplain returned with shawl with some bands of [place].  Conversation with spouse regarding emotions that accompany grief.  Family members continuing to arrive.  Chaplain will continue to follow.

## 2017-06-25 NOTE — Discharge Summary (Deleted)
[name] NAME: [name]    MR#:  [id number]  DATE OF BIRTH:  1933/12/18  DATE OF ADMISSION:  06/14/2017 ADMITTING PHYSICIAN: Idelle Crouch, MD  DATE OF DISCHARGE: 26-Jun-2017  PRIMARY CARE PHYSICIAN: Ria Bush, MD    ADMISSION DIAGNOSIS:  Bradycardia [R00.1] Weakness [R53.1] AKI (acute kidney injury) ([place]) [N17.9] Hypotension, unspecified hypotension type [I95.9]  DISCHARGE DIAGNOSIS:  Acute encephalopathy--metabolic Hypotension, Bradycardia  Acute on chronic renal failure Failure to Thrive  SECONDARY DIAGNOSIS:   Past Medical History:  Diagnosis Date  . Arthritis   . Atrial fibrillation ([place]) 06/23/2010  . B12 deficiency   . C. difficile colitis 07/06/2011  . CHF (congestive heart failure) (HCC)    diastolic. Echo 5/10 with mild LVH, EF 16%, grade 1 diastolic dysfunction, severe left atrial enlargement, aortic sclerosis  . Chronic a-fib (HCC)    on pradaxa, declined cardioversion in past  . COPD (chronic obstructive pulmonary disease) ([place])    (Dr. Raul Del)  . 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  History of gastric varices.  . Diabetes mellitus, type 2 ([place])   . Gastric and duodenal angiodysplasia 06/28/2011  . Gastric varices   . GI bleed   . Hemorrhoids   . History of bladder cancer 1984   s/p ileal conduit  . History of ileostomy   . History of pneumonia 2012  . Hyperlipidemia   . Hypertension   . Inguinal hernia    left  . Iron deficiency anemia   . Kidney atrophy 02/2016  . Pinched nerve    back  . Pyloric stenosis    mild EGD 10/26/06  . RLL pneumonia ([place]) 01/28/2014  . Shortness of breath     HOSPITAL COURSE:  [name] is a 84 y.o. male has a past medical history significant for A-fib, CHF, COPD, and DM recently discharged who now presents to ER after becoming "unresponsive" at home. In ER, pt in acute respiratory failure  requiring BiPAP. Aslo in ARF with hypotension and bradycardia. He is not verbally responsive. Family is present. He is "DNR" per family. After a long discussion with multiple family members, they have opted for comfort care  *. Acute encphalopathy--metabolic/Sepsis from UTI -pt was d/c 2 days ago to home -he has multitude of chronic medical problems and came in with unresponsiveness  *.Acute on Chronic renal failure with hypotension,bradycardia and elevated LFT's Appears multiorgan failure with failure to thrive Dr Doy Hutching d/w family at length  In the ER and today I had a long conversation with wife and dter in the room. Family wishes to take pt home with hospice services. They do understand the risks and complications including death enroute.  D/w CM d/c planning Roxanol, ativan, scopalamine and oxygen for home  Will d/c home with Hospice services Pt is DNR  CONSULTS OBTAINED:    DRUG ALLERGIES:  No Known Allergies  DISCHARGE MEDICATIONS:   Allergies as of 06/26/2017   No Known Allergies     Medication List    STOP taking these medications   doxazosin 8 MG tablet Commonly known as:  CARDURA   ferrous sulfate 325 (65 FE) MG tablet Commonly known as:  FERRO-BOB   glucose blood test strip Commonly known as:  ONE TOUCH ULTRA TEST   magnesium oxide 400 MG tablet Commonly known as:  MAG-OX   nadolol 20 MG tablet Commonly known as:  CORGARD  pantoprazole 40 MG tablet Commonly known as:  PROTONIX   potassium chloride 10 MEQ tablet Commonly known as:  K-DUR,KLOR-CON   simvastatin 20 MG tablet Commonly known as:  ZOCOR   tiotropium 18 MCG inhalation capsule Commonly known as:  SPIRIVA   torsemide 10 MG tablet Commonly known as:  DEMADEX   umeclidinium-vilanterol 62.5-25 MCG/INH Aepb Commonly known as:  ANORO ELLIPTA   vitamin B-12 250 MCG tablet Commonly known as:  CYANOCOBALAMIN     TAKE these medications   acetaminophen 325 MG tablet Commonly known as:   TYLENOL Take 325 mg by mouth every 6 (six) hours as needed for mild pain or fever.   albuterol 108 (90 Base) MCG/ACT inhaler Commonly known as:  PROVENTIL HFA;VENTOLIN HFA Inhale 2 puffs into the lungs every 6 (six) hours as needed for wheezing.   LORazepam 0.5 MG tablet Commonly known as:  ATIVAN Take 1 tablet (0.5 mg total) by mouth every 6 (six) hours as needed for anxiety.   morphine CONCENTRATE 10 MG/0.5ML Soln concentrated solution Take 0.5 mLs (10 mg total) by mouth every 3 (three) hours as needed for severe pain.   scopolamine 1 MG/3DAYS Commonly known as:  TRANSDERM-SCOP Place 1 patch (1.5 mg total) onto the skin every 3 (three) days.       If you experience worsening of your admission symptoms, develop shortness of breath, life threatening emergency, suicidal or homicidal thoughts you must seek medical attention immediately by calling 911 or calling your MD immediately  if symptoms less severe.  You Must read complete instructions/literature along with all the possible adverse reactions/side effects for all the Medicines you take and that have been prescribed to you. Take any new Medicines after you have completely understood and accept all the possible adverse reactions/side effects.   Please note  You were cared for by a hospitalist during your hospital stay. If you have any questions about your discharge medications or the care you received while you were in the hospital after you are discharged, you can call the unit and asked to speak with the hospitalist on call if the hospitalist that took care of you is not available. Once you are discharged, your primary care physician will handle any further medical issues. Please note that NO REFILLS for any discharge medications will be authorized once you are discharged, as it is imperative that you return to your primary care physician (or establish a relationship with a primary care physician if you do not have one) for your  aftercare needs so that they can reassess your need for medications and monitor your lab values. Today   SUBJECTIVE   Unresponsive. Resting quietly  VITAL SIGNS:  Blood pressure (!) 68/37, pulse (!) 39, temperature 97.6 F (36.4 C), temperature source Oral, resp. rate (!) 23, height 5\' 8"  (1.727 m), weight 106.6 kg (235 lb), SpO2 (!) 68 %.  I/O:    Intake/Output Summary (Last 24 hours) at 06/29/17 0930 Last data filed at 06/29/2017 0045 Gross per 24 hour  Intake 728.75 ml  Output 30 ml  Net 698.75 ml    PHYSICAL EXAMINATION:  GENERAL:  84 y.o.-year-old patient lying in the bed with no acute distress. Chronically ill LUNGS: Normal breath sounds bilaterally, no wheezing, rales,rhonchi or crepitation. No use of accessory muscles of respiration.  CARDIOVASCULAR: S1, S2 normal. No murmurs, rubs, or gallops.  ABDOMEN: Soft, non-tender, non-distended. EXTREMITIES: No pedal edema, cyanosis, or clubbing.   SKIN: No obvious rash, lesion, or ulcer.  Limited exam--pt comfort care DATA REVIEW:   CBC  Recent Labs  Lab 05/30/2017 1203  WBC 3.5*  HGB 7.0*  HCT 21.3*  PLT 111*    Chemistries  Recent Labs  Lab 06/14/17 0818  06/12/2017 1203  NA 147*   < > 143  K 5.1   < > 5.4*  CL 109   < > 105  CO2 36*   < > 32  GLUCOSE 113*   < > 96  BUN 31*   < > 52*  CREATININE 1.41*   < > 3.14*  CALCIUM 8.7*   < > 8.3*  MG 1.7  --   --   AST  --   --  611*  ALT  --   --  100*  ALKPHOS  --   --  278*  BILITOT  --   --  2.2*   < > = values in this interval not displayed.    Microbiology Results   Recent Results (from the past 240 hour(s))  Blood culture (routine x 2)     Status: None (Preliminary result)   Collection Time: 06/23/2017 12:08 PM  Result Value Ref Range Status   Specimen Description BLOOD LT UPPER ARM  Final   Special Requests   Final    BOTTLES DRAWN AEROBIC AND ANAEROBIC Blood Culture adequate volume   Culture   Final    NO GROWTH < 24 HOURS Performed at [facility], [address] [place], [place] [address]    Report Status PENDING  Incomplete  Blood culture (routine x 2)     Status: None (Preliminary result)   Collection Time: 06/17/2017 12:13 PM  Result Value Ref Range Status   Specimen Description BLOOD R WRIST  Final   Special Requests   Final    BOTTLES DRAWN AEROBIC AND ANAEROBIC Blood Culture adequate volume   Culture   Final    NO GROWTH < 24 HOURS Performed at [facility], [address] [place], [place] [address]    Report Status PENDING  Incomplete    RADIOLOGY:  Dg Chest Portable 1 View  Result Date: 06/12/2017 CLINICAL DATA:  Altered mental status EXAM: PORTABLE CHEST 1 VIEW COMPARISON:  06/08/2017 FINDINGS: Thorax is rotated to the right. The heart is upper normal in size. There is apparent pleural based opacity at the right apex which may be related to vasculature and rotation of the thorax. Stable right pleural effusion. Left jugular dialysis catheter is been removed. Vascular congestion. No Kerley B lines. IMPRESSION: Vascular congestion without definite pulmonary edema. Right pleural effusion. Opacity at the right apex may be related to vasculature and rotation of the thorax. PA and lateral upright chest radiograph series is recommended when feasible. Electronically Signed   By: Marybelle Killings M.D.   On: 06/11/2017 12:29     Management plans discussed with the patient, family and they are in agreement.  CODE STATUS:     Code Status Orders  (From admission, onward)        Start     Ordered   06/24/2017 1421  Do not attempt resuscitation (DNR)  Continuous    Question Answer Comment  In the event of cardiac or respiratory ARREST Do not call a "code blue"   In the event of cardiac or respiratory ARREST Do not perform Intubation, CPR, defibrillation or ACLS   In the event of cardiac or respiratory ARREST Use medication by any route, position, wound care, and other  measures to relive pain and suffering. May  use oxygen, suction and manual treatment of airway obstruction as needed for comfort.      06/03/2017 1420    Code Status History    Date Active Date Inactive Code Status Order ID Comments User Context   05/30/2017 1342 06/22/2017 1420 DNR [id number]  Idelle Crouch, MD ED   06/07/2017 1602 06/16/2017 2145 DNR [id number]  Jimmy Footman, NP Inpatient   05/31/2017 0329 06/07/2017 1602 Full Code [id number]  Harrie Foreman, MD ED    Advance Directive Documentation     Most Recent Value  Type of Advance Directive  Healthcare Power of Attorney, Living will  Pre-existing out of facility DNR order (yellow form or pink MOST form)  -  "MOST" Form in Place?  -      TOTAL TIME TAKING CARE OF THIS PATIENT: 40 minutes.    Fritzi Mandes M.D on 07-18-17 at 9:30 AM  Between 7am to 6pm - Pager - [phone] After 6pm go to [email or web address] - password [email or web address] [place] [facility]  Office  [phone]  CC: Primary care physician; Ria Bush, MD

## 2017-06-25 NOTE — Care Management Note (Signed)
Case Management Note  Patient Details  Name: [name] MRN: [id number] Date of Birth: [birth year]  Subjective/Objective:  Referral to received from Dr. Posey Pronto for home hospice services. Spoke with daughter, [name], [facility] TC to [place] at [facility] with referral. Patient will need hospital bed and O2. Notified [facility] of change in care. Faxed needed documentation to [facility].Marland Kitchen                  Action/Plan:   Expected Discharge Date:  08-Jul-2017               Expected Discharge Plan:  Home w Hospice Care  In-House Referral:     Discharge planning Services  CM Consult  Post Acute Care Choice:  Hospice Choice offered to:  Adult Children  DME Arranged:    DME Agency:     HH Arranged:  Disease Management Early Agency:  [facility]  Status of Service:  In process, will continue to follow  If discussed at Long Length of Stay Meetings, dates discussed:    Additional Comments:  Jolly Mango, RN 07/08/17, 10:00 AM

## 2017-06-25 NOTE — Progress Notes (Signed)
   2017/07/19 1235  Clinical Encounter Type  Visited With Patient and family together  Visit Type Follow-up  Referral From Nurse  Consult/Referral To Chaplain  Spiritual Encounters  Spiritual Needs Emotional;Grief support;Prayer   Chaplain responded to page from staff that family was requesting chaplain.  Patient's spouse indicated that family was ready to begin leaving, though spouse said she 'didn't really want to.'  Chaplain utilized active listening during patient spouse's reflections.  Chaplain offered prayer for family and left space for family to offer prayers as well.  Per patient's daughter, family has already spoken to staff about funeral release form.  Patient's spouse left, some family continues to remain.  Chaplain encouraged family to call chaplain as needed.

## 2017-06-25 NOTE — Death Summary Note (Signed)
DEATH SUMMARY   Patient Details  Name: [name] MRN: [id number] DOB: 10/04/1933  Admission/Discharge Information   Admit Date:  2017/07/13  Date of Death: Date of Death: 07/14/17  Time of Death: Time of Death: May 17, 1000  Length of Stay: 1  Referring Physician: Ria Bush, MD   Reason(s) for Hospitalization  unresponsive  Diagnoses  Preliminary cause of death:   acute on chronic renal failure acute encephalopathy severe hypotension [place] to thrive   Brief Hospital Course (including significant findings, care, treatment, and services provided and events leading to death)  [name] is a 84 y.o. year old male who  has a past medical history significant forA-fib, CHF, COPD, and DM recently discharged who now presents to ER after becoming "unresponsive" at home. In ER, pt in acute respiratory failure requiring BiPAP. Aslo in ARF with hypotension and bradycardia. He is not verbally responsive. Family is present. He is "DNR" per family. After a long discussion with multiple family members, they  opted for comfort care  *. Acute encphalopathy--metabolic/Sepsis from UTI -pt was d/ced 2 days ago to home -he has multitude of chronic medical problems and came in with unresponsiveness  *.Acute on Chronic renal failure with hypotension,bradycardia and elevated LFT's Appears multiorgan failure with failure to thrive Dr Doy Hutching d/w family at length  In the ER and today I had a long conversation with wife and dter in the room. Family wishes to take pt home with hospice services. They do understand the risks and complications including death enroute. -Arrangements were made for patient to be transported back to home however patient was pronounced at 10:02 AM.  He was in the room with patient  Pt was DNR      Pertinent Labs and Studies  Significant Diagnostic Studies Ct Head Wo Contrast  Result Date: 05/31/2017 CLINICAL DATA:  Altered level of consciousness, patient fell  out of bed and is hypotensive with bradycardia. EXAM: CT HEAD WITHOUT CONTRAST TECHNIQUE: Contiguous axial images were obtained from the base of the skull through the vertex without intravenous contrast. COMPARISON:  None. FINDINGS: Brain: Remote appearing right thalamic lacunar infarct. Age related involutional changes of the brain with mild-to-moderate small vessel ischemic disease. No large vascular territory infarction, hemorrhage or midline shift. No intra-axial mass nor extra-axial fluid collections. Vascular: No hyperdense vessel sign. Skull: Intact Sinuses/Orbits: Complete opacification of the included maxillary sinuses with desiccated mucus seen within. Moderate ethmoid sinus mucosal thickening more so anteriorly. The sphenoid sinus is clear. There is right-sided frontal sinus mucosal opacification. Intact orbits and globes. Other: None IMPRESSION: Paranasal sinus mucosal thickening in opacification as above. Remote appearing small vessel ischemic disease and right thalamic lacunar infarct. No acute intracranial abnormality. Electronically Signed   By: Ashley Royalty M.D.   On: 05/31/2017 01:47   US Renal  Result Date: 05/31/2017 CLINICAL DATA:  84 year old male with renal failure. Bladder cancer post cystectomy. Initial encounter. EXAM: RENAL / URINARY TRACT ULTRASOUND COMPLETE COMPARISON:  04/01/2016 MR. FINDINGS: Right Kidney: Length: 10.6 cm. Thin renal cortex. No hydronephrosis. Upper pole 4.7 x 4.8 x 5.5 cm cyst. Left Kidney: Length: 10.1 cm. Thin renal parenchyma. No hydronephrosis or mass. Bladder: Surgically removed. Ascites. Heterogeneous liver with lobular contour consistent with history of cirrhosis. Splenomegaly. IMPRESSION: Bilateral thin renal parenchyma. No hydronephrosis. 5.5 cm right renal cyst. Urinary bladder surgically removed. Ascites around the liver and spleen. Heterogeneous liver with lobular contour consistent with history of cirrhosis. Splenomegaly. Liver and spleen not completely  assessed. Electronically  Signed   By: Genia Del M.D.   On: 05/31/2017 12:31   US Venous Img Upper Uni Right  Result Date: 06/05/2017 CLINICAL DATA:  84 year old male with a history of right upper arm swelling EXAM: RIGHT UPPER EXTREMITY VENOUS DOPPLER ULTRASOUND TECHNIQUE: Gray-scale sonography with graded compression, as well as color Doppler and duplex ultrasound were performed to evaluate the upper extremity deep venous system from the level of the subclavian vein and including the jugular, axillary, basilic, radial, ulnar and upper cephalic vein. Spectral Doppler was utilized to evaluate flow at rest and with distal augmentation maneuvers. COMPARISON:  None. FINDINGS: Contralateral Subclavian Vein: Respiratory phasicity is normal and symmetric with the symptomatic side. No evidence of thrombus. Normal compressibility. Internal Jugular Vein: No evidence of thrombus. Normal compressibility, respiratory phasicity and response to augmentation. Subclavian Vein: No evidence of thrombus. Normal compressibility, respiratory phasicity and response to augmentation. Axillary Vein: No evidence of thrombus. Normal compressibility, respiratory phasicity and response to augmentation. Cephalic Vein: No evidence of thrombus. Normal compressibility, respiratory phasicity and response to augmentation. Basilic Vein: No evidence of thrombus. Normal compressibility, respiratory phasicity and response to augmentation. Brachial Veins: No evidence of thrombus. Normal compressibility, respiratory phasicity and response to augmentation. Radial Veins: No evidence of thrombus. Normal compressibility, respiratory phasicity and response to augmentation. Ulnar Veins: No evidence of thrombus. Normal compressibility, respiratory phasicity and response to augmentation. Other Findings:  None visualized. IMPRESSION: Sonographic survey of the right upper extremity negative for DVT Electronically Signed   By: Corrie Mckusick D.O.   On:  06/05/2017 11:22   Dg Chest Portable 1 View  Result Date: 06/17/2017 CLINICAL DATA:  Altered mental status EXAM: PORTABLE CHEST 1 VIEW COMPARISON:  06/08/2017 FINDINGS: Thorax is rotated to the right. The heart is upper normal in size. There is apparent pleural based opacity at the right apex which may be related to vasculature and rotation of the thorax. Stable right pleural effusion. Left jugular dialysis catheter is been removed. Vascular congestion. No Kerley B lines. IMPRESSION: Vascular congestion without definite pulmonary edema. Right pleural effusion. Opacity at the right apex may be related to vasculature and rotation of the thorax. PA and lateral upright chest radiograph series is recommended when feasible. Electronically Signed   By: Marybelle Killings M.D.   On: 05/28/2017 12:29   Dg Chest Port 1 View  Result Date: 06/08/2017 CLINICAL DATA:  Respiratory failure. EXAM: PORTABLE CHEST 1 VIEW COMPARISON:  Radiograph Jun 07, 2017. FINDINGS: Stable cardiomegaly with central pulmonary vascular congestion. No pneumothorax is noted. Left internal jugular dialysis catheter is unchanged in position. Stable bibasilar densities are noted concerning for edema with mild right pleural effusion. Bony thorax is unremarkable. Minimal left pleural effusion is noted. IMPRESSION: Stable bilateral pulmonary edema with pleural effusions. Electronically Signed   By: Marijo Conception, M.D.   On: 06/08/2017 07:10   Dg Chest Port 1 View  Result Date: 06/07/2017 CLINICAL DATA:  Pneumonia EXAM: PORTABLE CHEST 1 VIEW COMPARISON:  Two days ago FINDINGS: Central line with tip at the SVC origin. Diffuse interstitial coarsening with layering pleural effusions greater on the right. Stable borderline cardiomegaly. No pneumothorax. Artifact from EKG leads. IMPRESSION: Pulmonary edema and layering pleural effusions. No change from 2 days prior. Electronically Signed   By: Monte Fantasia M.D.   On: 06/07/2017 07:12   Dg Chest Port 1  View  Result Date: 06/05/2017 CLINICAL DATA:  Acute respiratory failure, history CHF EXAM: PORTABLE CHEST 1 VIEW COMPARISON:  Portable exam  0501 hours compared to 06/02/2017 FINDINGS: LEFT jugular line with tip projecting over SVC. Enlargement of cardiac silhouette with pulmonary vascular congestion. Atherosclerotic calcification aorta. Pericardial calcification again identified. Increased RIGHT pleural effusion and mild RIGHT basilar atelectasis. Diffuse interstitial infiltrates favor pulmonary edema. No pneumothorax. IMPRESSION: Persistent pulmonary edema. Increased RIGHT pleural effusion and mild RIGHT basilar atelectasis. Electronically Signed   By: Lavonia Dana M.D.   On: 06/05/2017 07:14   Dg Chest Port 1 View  Result Date: 06/02/2017 CLINICAL DATA:  Acute respiratory failure EXAM: PORTABLE CHEST 1 VIEW COMPARISON:  05/31/2017 FINDINGS: Cardiac shadow is within normal limits. Left jugular central line is again seen and stable. Some increasing atelectasis is noted in the right upper lobe along the minor fissure. Additionally slight increase in right-sided pleural effusion is noted as well. No other focal abnormality is noted. IMPRESSION: Increasing right upper lobe atelectasis and right pleural effusion. Electronically Signed   By: Inez Catalina M.D.   On: 06/02/2017 10:21   Dg Chest Port 1 View  Result Date: 05/31/2017 CLINICAL DATA:  Status post central line placement EXAM: PORTABLE CHEST 1 VIEW COMPARISON:  05/30/2017 FINDINGS: Cardiac shadow is stable. New left jugular temporary dialysis catheter is seen in satisfactory position. No pneumothorax is noted. Patchy atelectatic changes are noted throughout both lungs. No focal confluent infiltrate is seen. IMPRESSION: No pneumothorax following central line placement. Electronically Signed   By: Inez Catalina M.D.   On: 05/31/2017 13:13   Dg Chest Port 1 View  Result Date: 05/31/2017 CLINICAL DATA:  Weakness and declining health. EXAM: PORTABLE CHEST 1  VIEW COMPARISON:  None. FINDINGS: Cardiomegaly with moderate aortic atherosclerosis. Pulmonary vascular redistribution with small right effusion compatible with CHF. No acute osseous abnormality. IMPRESSION: Cardiomegaly with mild CHF and small right effusion. Electronically Signed   By: Ashley Royalty M.D.   On: 05/31/2017 00:29    Microbiology Recent Results (from the past 240 hour(s))  Blood culture (routine x 2)     Status: None (Preliminary result)   Collection Time: 06/07/2017 12:08 PM  Result Value Ref Range Status   Specimen Description BLOOD LT UPPER ARM  Final   Special Requests   Final    BOTTLES DRAWN AEROBIC AND ANAEROBIC Blood Culture adequate volume   Culture   Final    NO GROWTH < 24 HOURS Performed at [facility], [address] [place], [place] [address]    Report Status PENDING  Incomplete  Blood culture (routine x 2)     Status: None (Preliminary result)   Collection Time: 06/08/2017 12:13 PM  Result Value Ref Range Status   Specimen Description BLOOD R WRIST  Final   Special Requests   Final    BOTTLES DRAWN AEROBIC AND ANAEROBIC Blood Culture adequate volume   Culture   Final    NO GROWTH < 24 HOURS Performed at [facility], [address], [place], [place] [address]    Report Status PENDING  Incomplete    Lab Basic Metabolic Panel: Recent Labs  Lab 06/13/17 0604 06/14/17 0818 06/15/17 0504 06/16/17 1401 06/21/2017 1203  NA 145 147* 147* 146* 143  K 4.5 5.1 4.7 4.4 5.4*  CL 107 109 107 105 105  CO2 31 36* 34* 35* 32  GLUCOSE 97 113* 92 157* 96  BUN 27* 31* 35* 39* 52*  CREATININE 1.03 1.41* 1.51* 1.72* 3.14*  CALCIUM 8.4* 8.7* 8.7* 8.9 8.3*  MG 1.8 1.7  --   --   --  PHOS  --  3.8  --   --   --    Liver Function Tests: Recent Labs  Lab 05/31/2017 1203  AST 611*  ALT 100*  ALKPHOS 278*  BILITOT 2.2*  PROT 5.5*  ALBUMIN 2.2*   No results for input(s): LIPASE, AMYLASE in the last 168 hours. No results for input(s): AMMONIA  in the last 168 hours. CBC: Recent Labs  Lab 05/28/2017 1203  WBC 3.5*  NEUTROABS 2.6  HGB 7.0*  HCT 21.3*  MCV 96.7  PLT 111*   Cardiac Enzymes: Recent Labs  Lab 06/04/2017 1203  TROPONINI <0.03   Sepsis Labs: Recent Labs  Lab 06/23/2017 1203  WBC 3.5*  LATICACIDVEN 1.6    Procedures/Operations     Arrion Broaddus 07/08/17, 12:31 PM

## 2017-06-25 NOTE — Progress Notes (Signed)
   2017/06/20 1140  Clinical Encounter Type  Visited With Patient and family together  Visit Type Follow-up  Spiritual Encounters  Spiritual Needs Emotional;Grief support   Chaplain followed up with patient spouse and family.  Family planning funeral, listening to music to capture patient.  Some family has begun to depart.  Chaplain to continue to follow.

## 2017-06-25 NOTE — Care Management Note (Signed)
Case Management Note  Patient Details  Name: [name] MRN: [id number] Date of Birth: 08-17-1933  Subjective/Objective:  Patient expired. Hospice notified.                    Action/Plan:   Expected Discharge Date:  2017-06-23               Expected Discharge Plan:  Home w Hospice Care  In-House Referral:     Discharge planning Services  CM Consult  Post Acute Care Choice:  Hospice Choice offered to:  Adult Children  DME Arranged:    DME Agency:     HH Arranged:  Disease Management [place] Agency:  [facility]  Status of Service:  Completed, signed off  If discussed at [place] of Stay Meetings, dates discussed:    Additional Comments:  Jolly Mango, RN 06-23-2017, 10:08 AM

## 2017-06-25 DEATH — deceased

## 2017-06-27 ENCOUNTER — Inpatient Hospital Stay: Payer: PPO | Admitting: Family Medicine

## 2017-07-05 ENCOUNTER — Other Ambulatory Visit (INDEPENDENT_AMBULATORY_CARE_PROVIDER_SITE_OTHER): Payer: PPO | Admitting: Vascular Surgery

## 2017-07-08 ENCOUNTER — Encounter (INDEPENDENT_AMBULATORY_CARE_PROVIDER_SITE_OTHER): Payer: PPO

## 2017-07-13 ENCOUNTER — Encounter (INDEPENDENT_AMBULATORY_CARE_PROVIDER_SITE_OTHER): Payer: PPO

## 2017-08-01 ENCOUNTER — Ambulatory Visit: Payer: PPO | Admitting: Family Medicine

## 2018-04-24 ENCOUNTER — Ambulatory Visit: Payer: PPO

## 2018-05-02 ENCOUNTER — Encounter: Payer: PPO | Admitting: Family Medicine

## 2019-09-13 IMAGING — US US RENAL
1 series · 14 of 25 positions shown · non-contrast
Comparison: 04/01/2016 MR.

CLINICAL DATA: 83-year-old male with renal failure. Bladder cancer
post cystectomy. Initial encounter.

EXAM:
RENAL / URINARY TRACT ULTRASOUND COMPLETE

[Series 1: us renal · 14 of 55 slices shown]
[im 1/55]
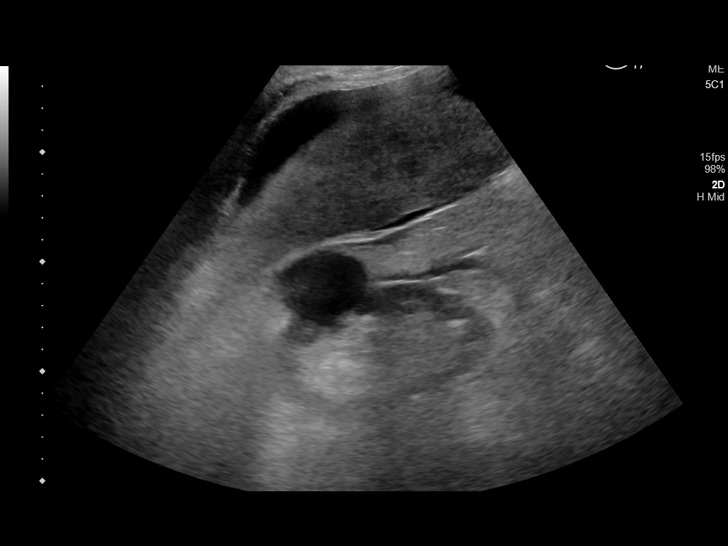
[im 5/55]
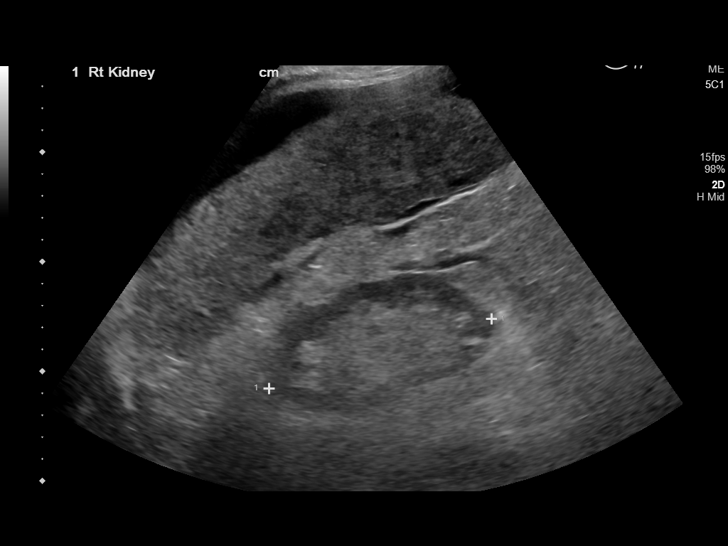
[im 10/55]
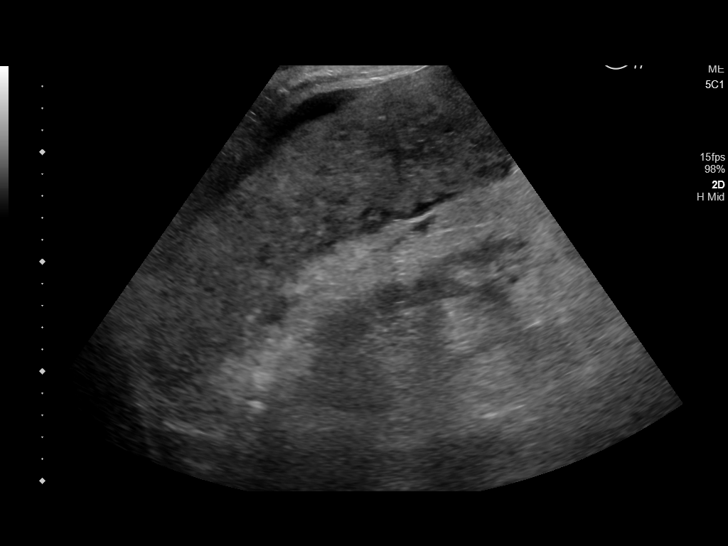
[im 14/55]
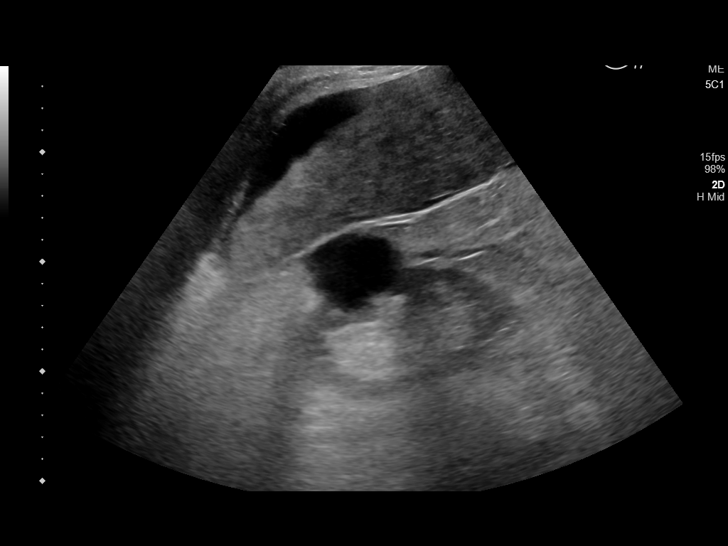
[im 19/55]
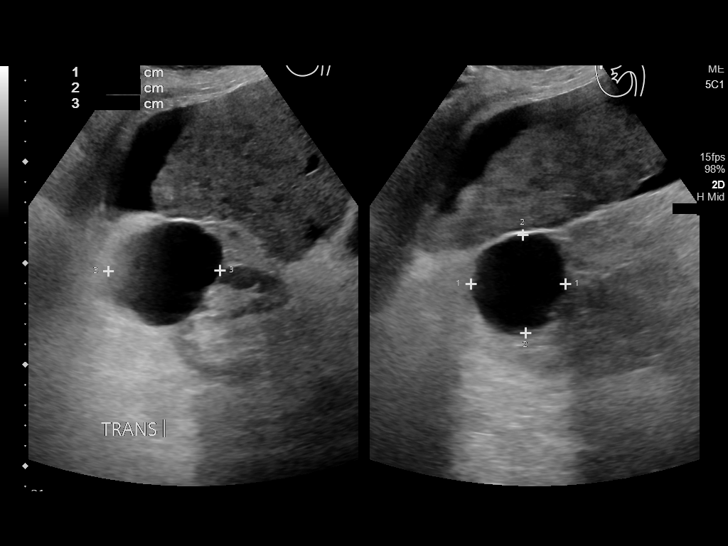
[im 21/55]
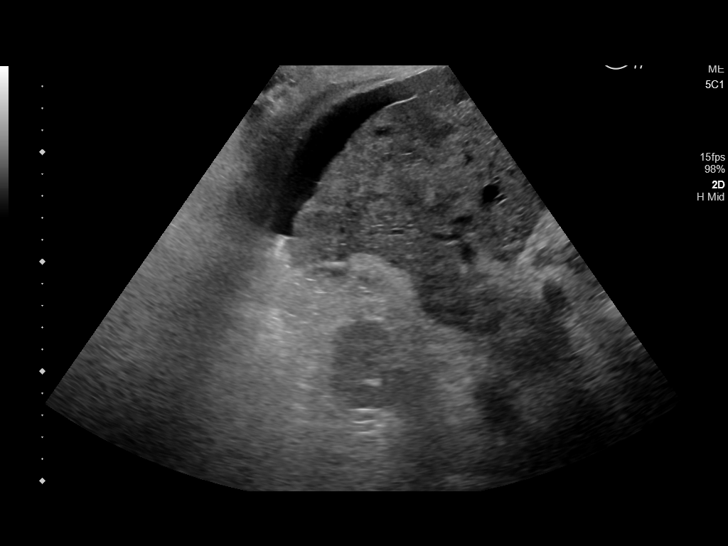
[im 25/55]
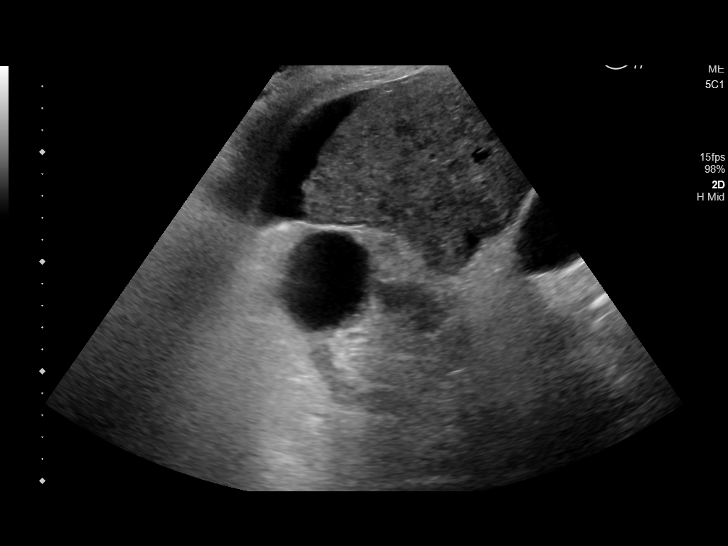
[im 30/55]
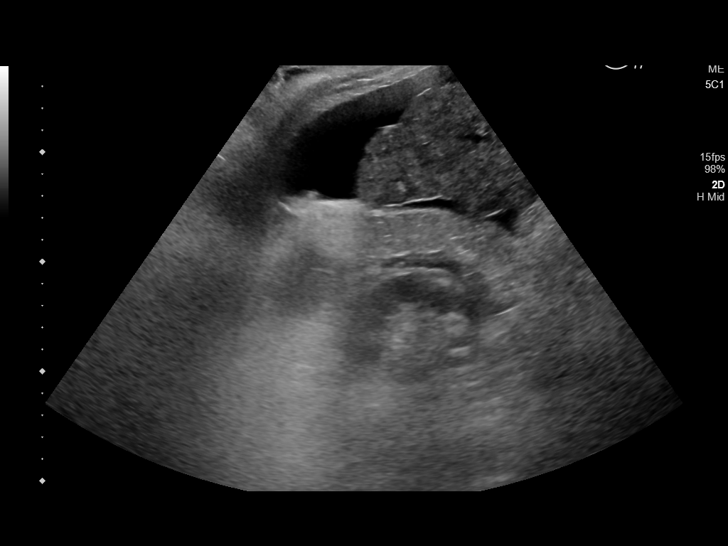
[im 34/55]
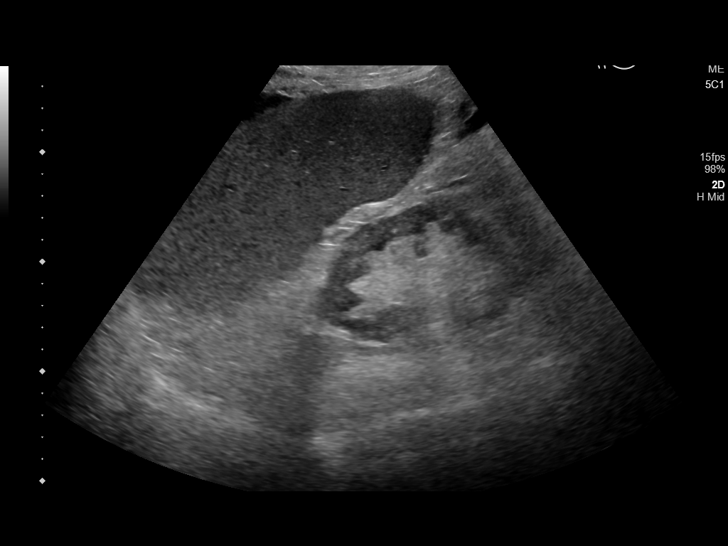
[im 37/55]
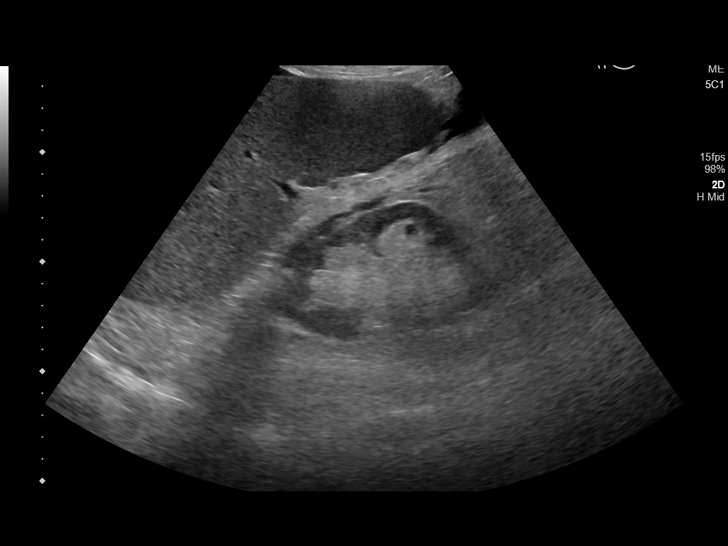
[im 41/55]
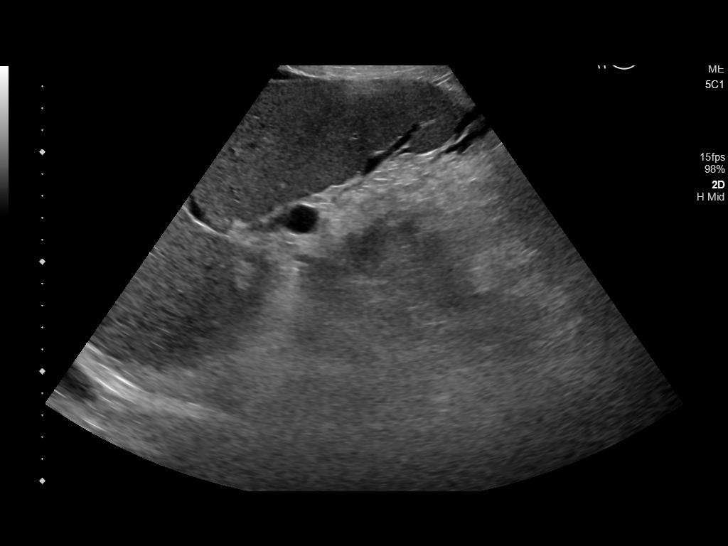
[im 46/55]
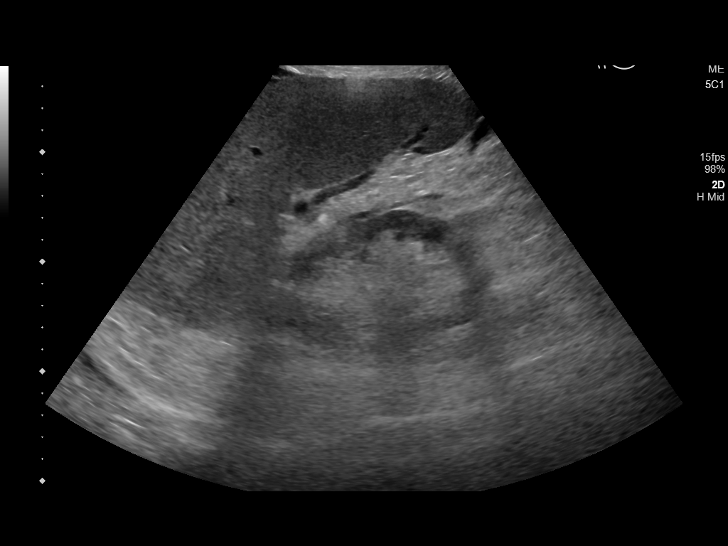
[im 50/55]
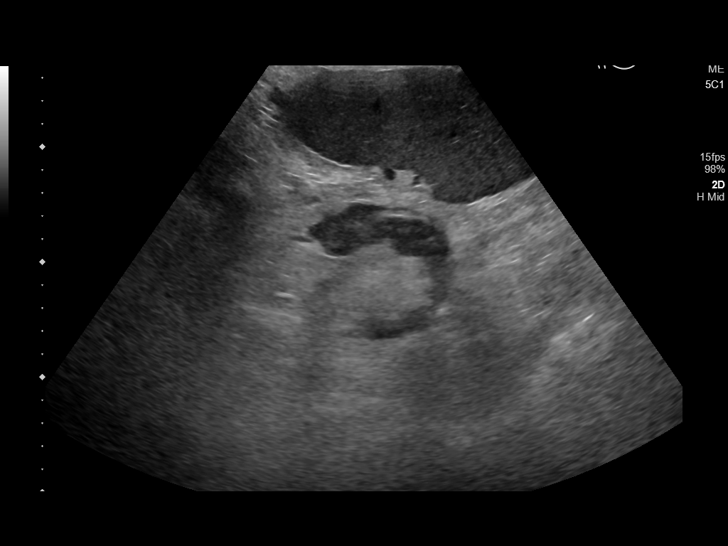
[im 55/55]
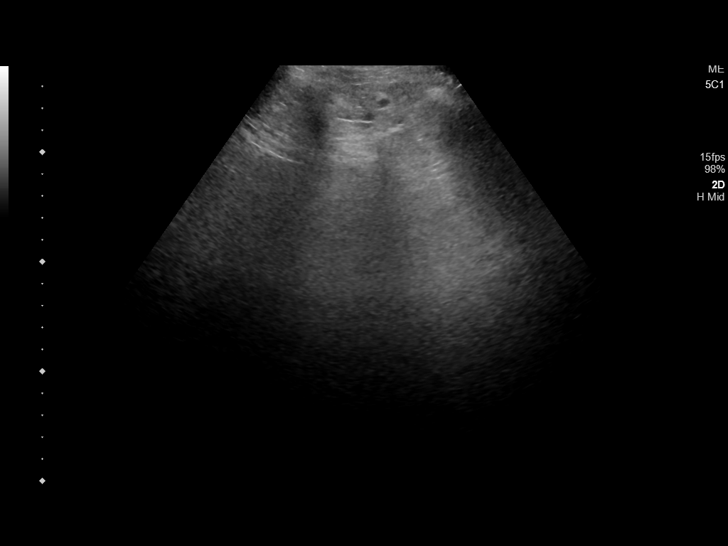

[14 of 25 positions shown; findings below may reference images not displayed]

FINDINGS: Right Kidney:

Length: 10.6 cm. Thin renal cortex. No hydronephrosis. Upper pole
4.7 x 4.8 x 5.5 cm cyst.

Left Kidney:

Length: 10.1 cm.. Thin renal parenchyma. No hydronephrosis or mass.

Bladder:

Surgically removed.

Ascites.

Heterogeneous liver with lobular contour consistent with history of
cirrhosis. Splenomegaly.
IMPRESSION: Bilateral thin renal parenchyma.

No hydronephrosis.

5.5 cm right renal cyst.

Urinary bladder surgically removed.

Ascites around the liver and spleen.

Heterogeneous liver with lobular contour consistent with history of
cirrhosis. Splenomegaly. Liver and spleen not completely assessed..

## 2019-09-13 IMAGING — DX DG CHEST 1V PORT
1 series · 1 of 1 positions shown · non-contrast
Comparison: 05/30/2017

CLINICAL DATA: Status post central line placement

EXAM:
PORTABLE CHEST 1 VIEW

[chest ap]
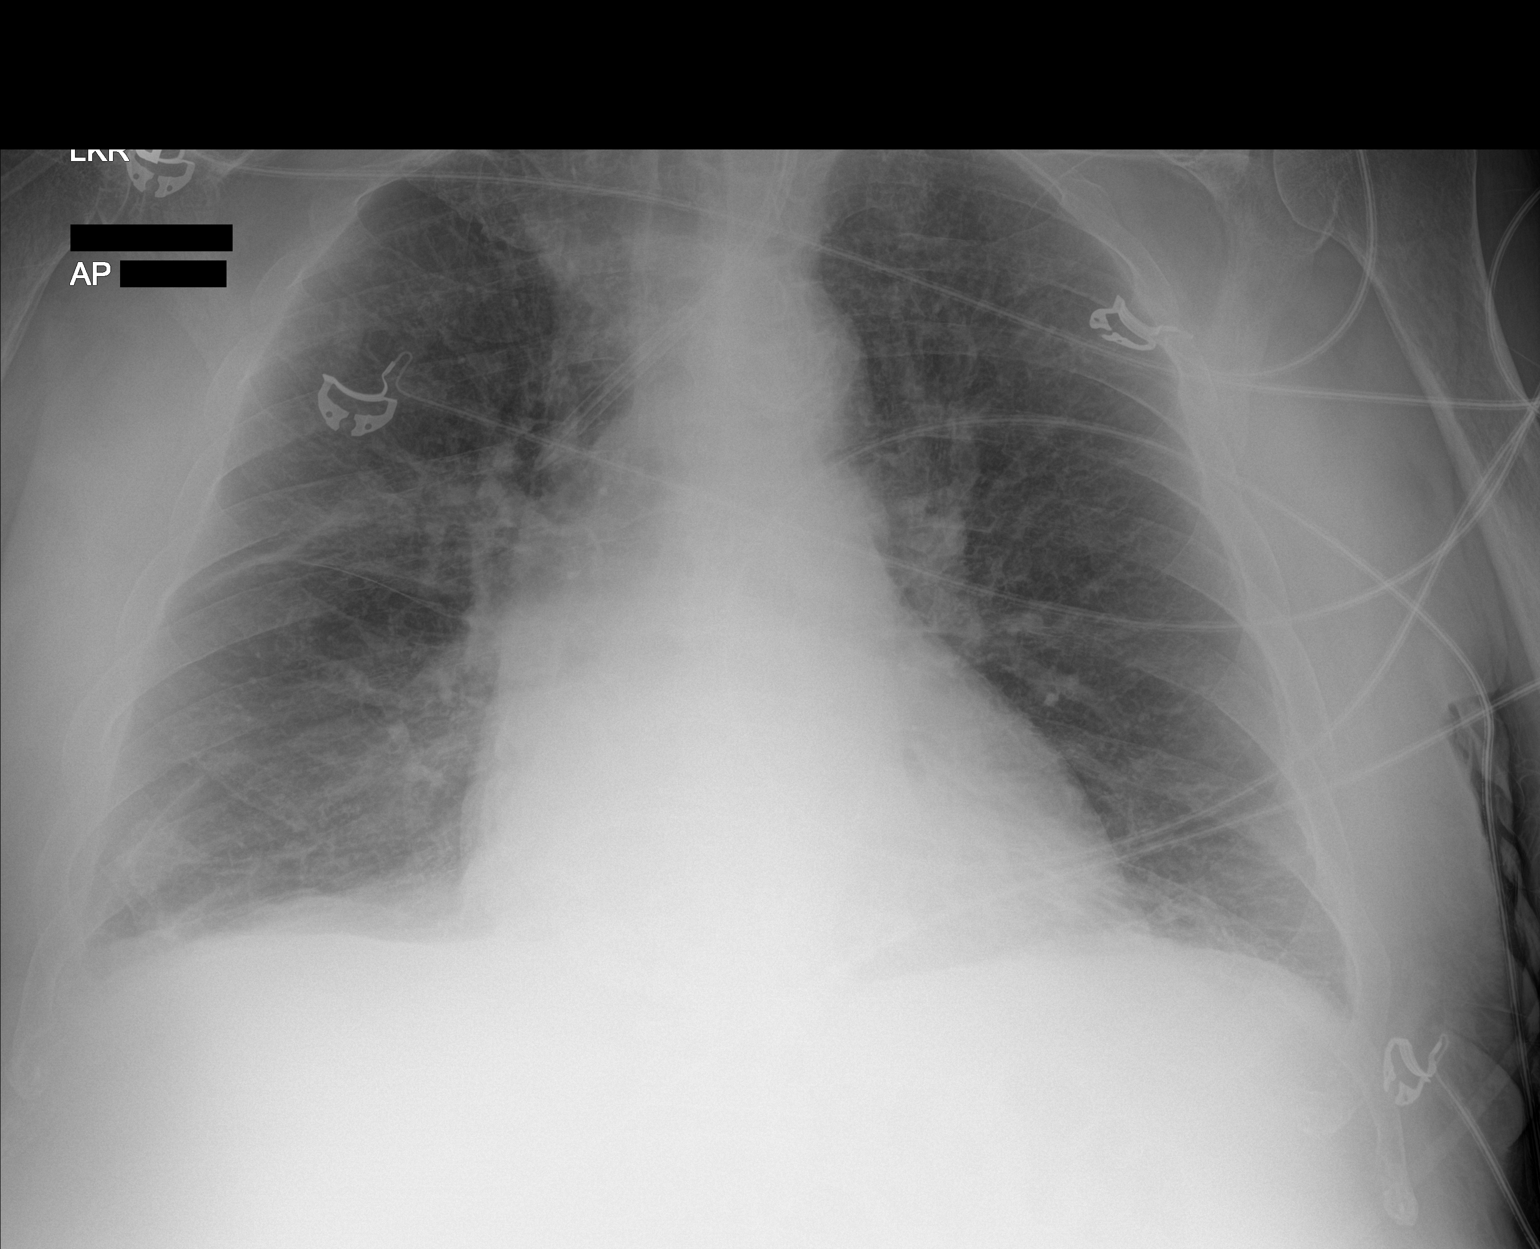

[1 of 1 positions shown; findings below may reference images not displayed]

FINDINGS: Cardiac shadow is stable. New left jugular temporary dialysis
catheter is seen in satisfactory position. No pneumothorax is noted.
Patchy atelectatic changes are noted throughout both lungs. No focal
confluent infiltrate is seen.
IMPRESSION: No pneumothorax following central line placement.

## 2019-09-13 IMAGING — CT CT HEAD W/O CM
3 series · 15 of 47 positions shown, 18 images · non-contrast
Comparison: None.

CLINICAL DATA: Altered level of consciousness, patient fell out of
bed and is hypotensive with bradycardia.

EXAM:
CT HEAD WITHOUT CONTRAST
TECHNIQUE: Contiguous axial images were obtained from the base of the skull
through the vertex without intravenous contrast.

[Series 3: head wo · axial · 0.43mm/px · z∈[-219,-94]mm · 9 of 31 slices shown, 12 images]
[im 3/31  brain]
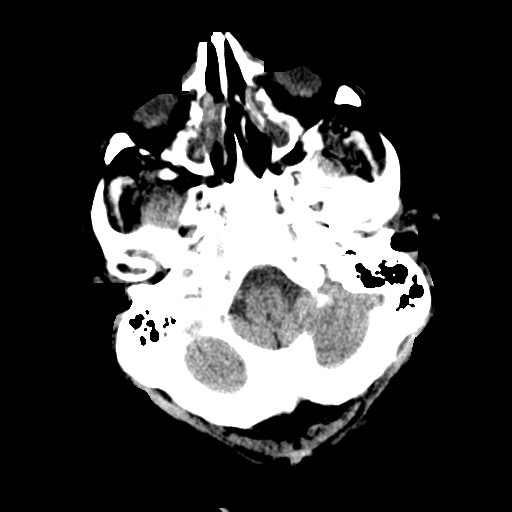
[im 3/31  bone]
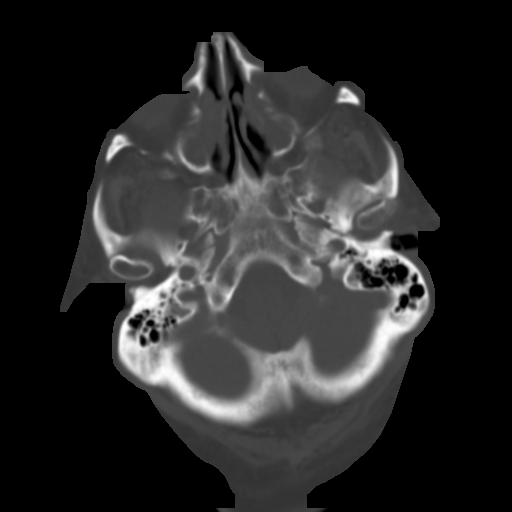
[im 6/31  brain]
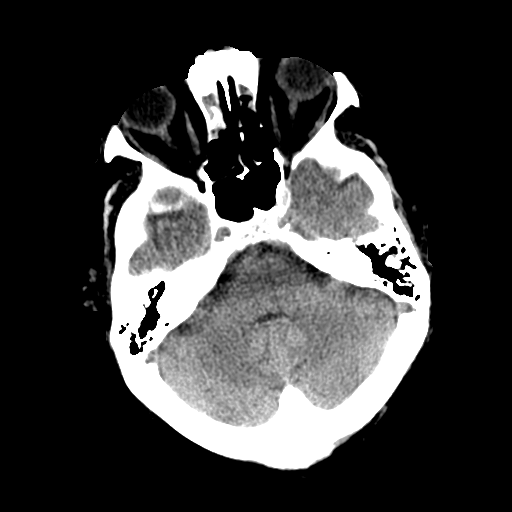
[im 9/31  brain]
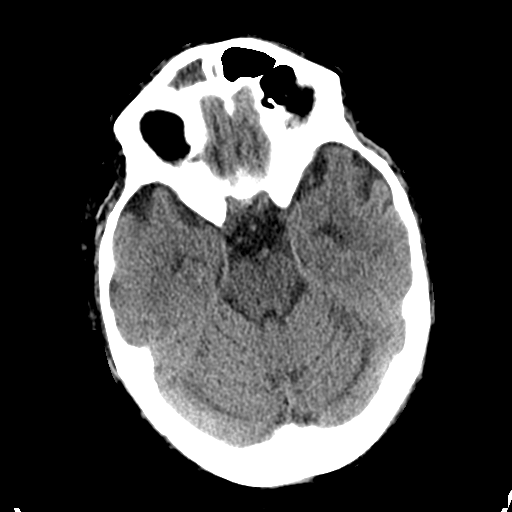
[im 12/31  brain]
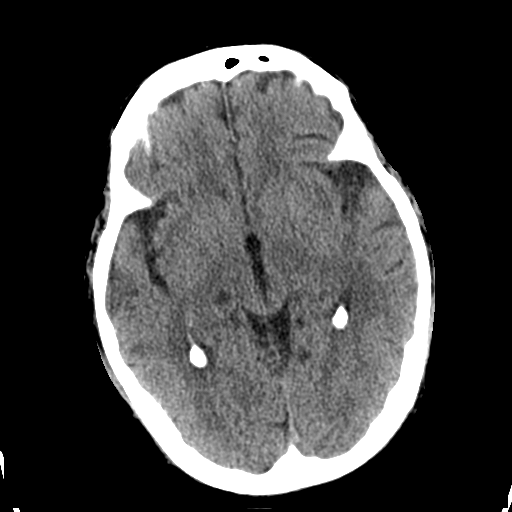
[im 16/31  brain]
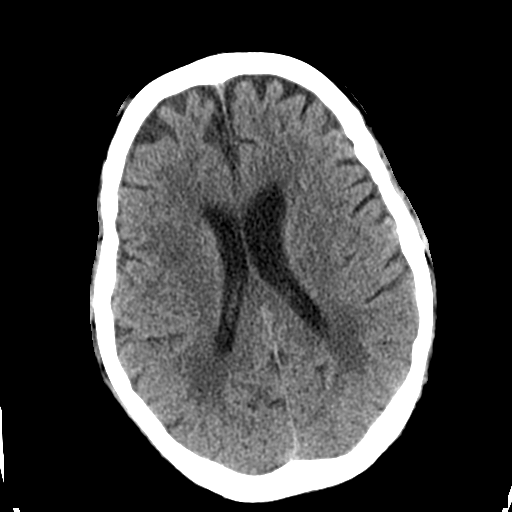
[im 16/31  bone]
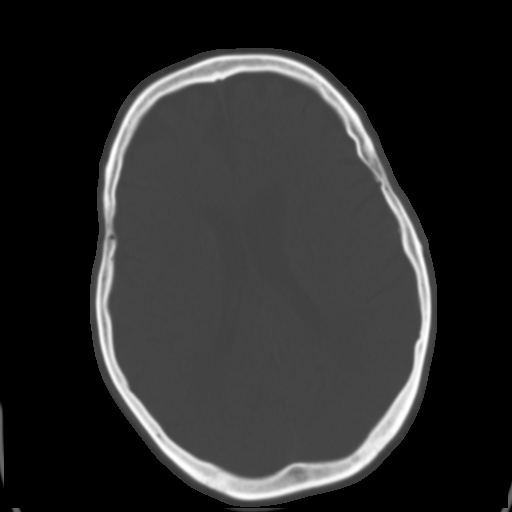
[im 19/31  brain]
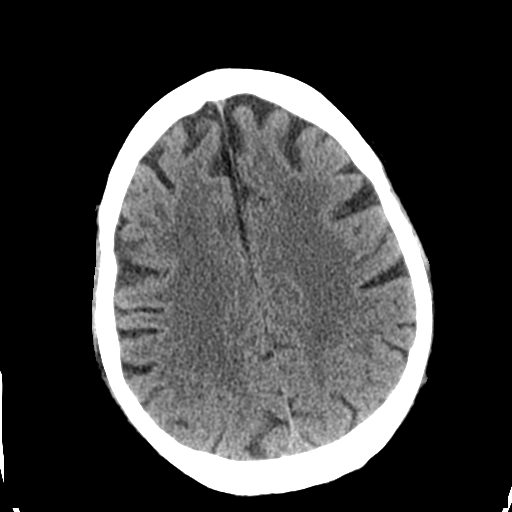
[im 22/31  brain]
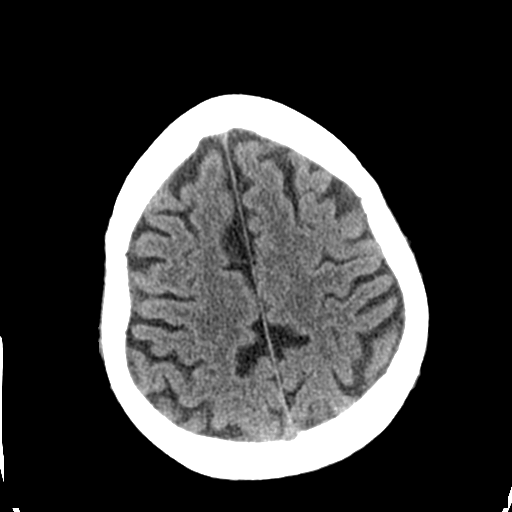
[im 25/31  brain]
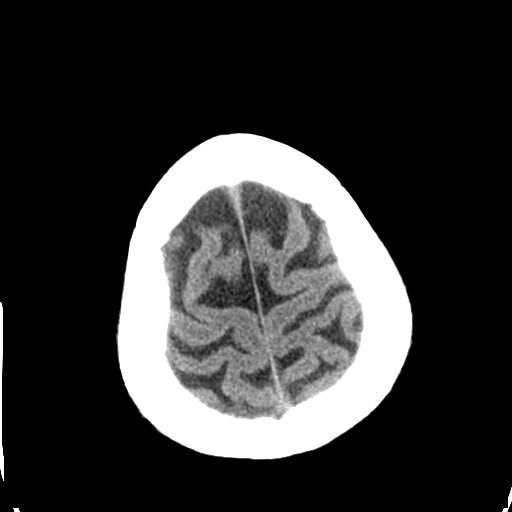
[im 28/31  brain]
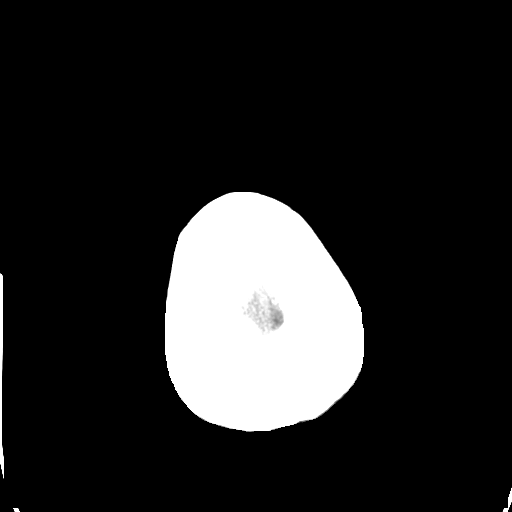
[im 28/31  bone]
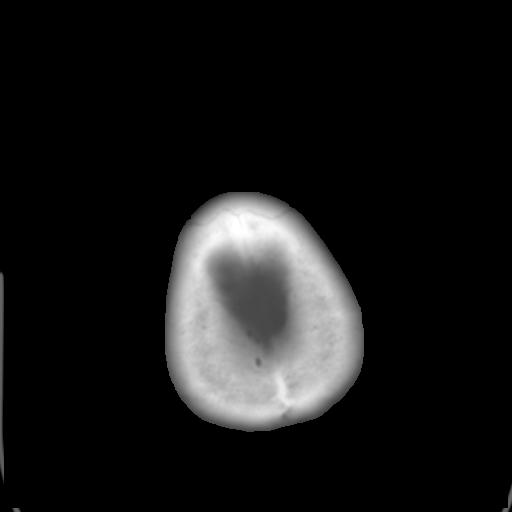

[Series 4: coronal soft tissue · coronal · 0.30mm/px · 3 of 69 slices shown]
[im 23/69  brain]
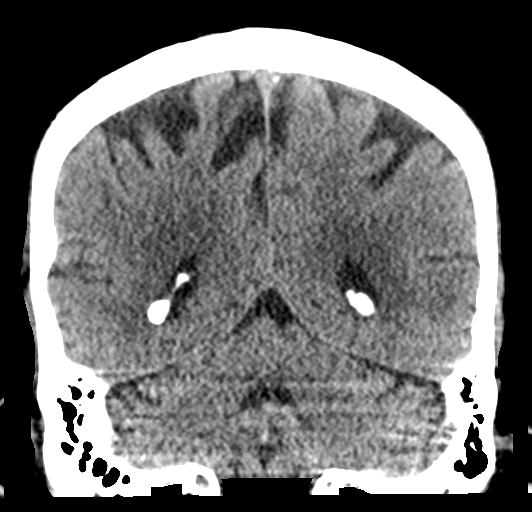
[im 31/69  brain]
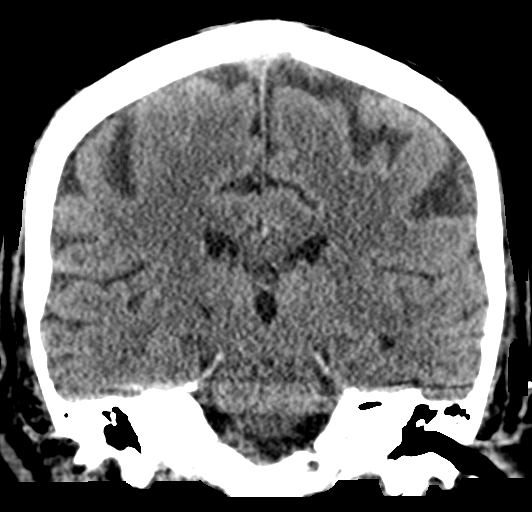
[im 38/69  brain]
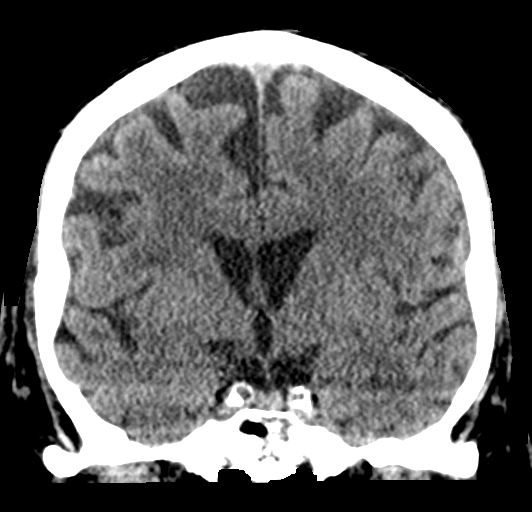

[Series 5: sagittal soft tissue · sagittal · 0.30mm/px · 3 of 55 slices shown]
[im 19/55  brain]
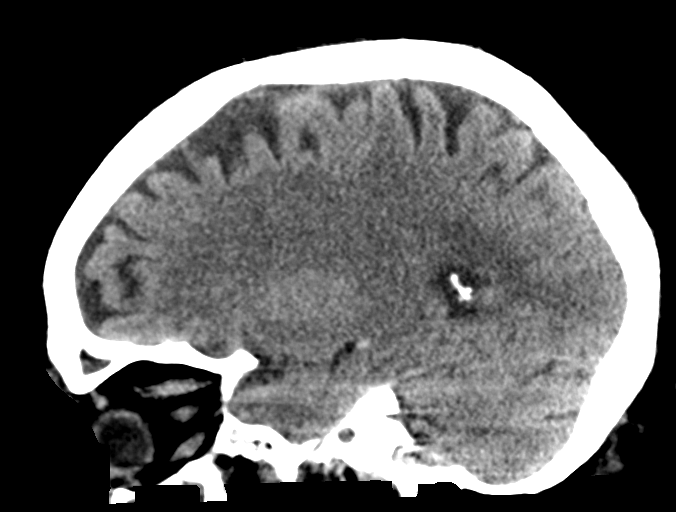
[im 28/55  brain]
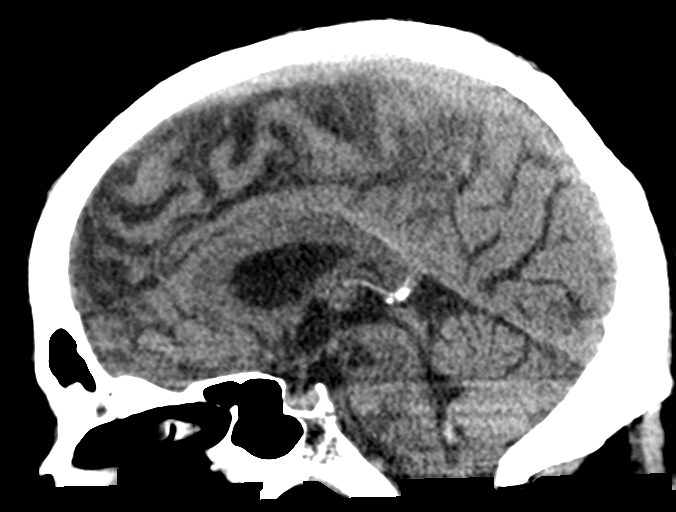
[im 37/55  brain]
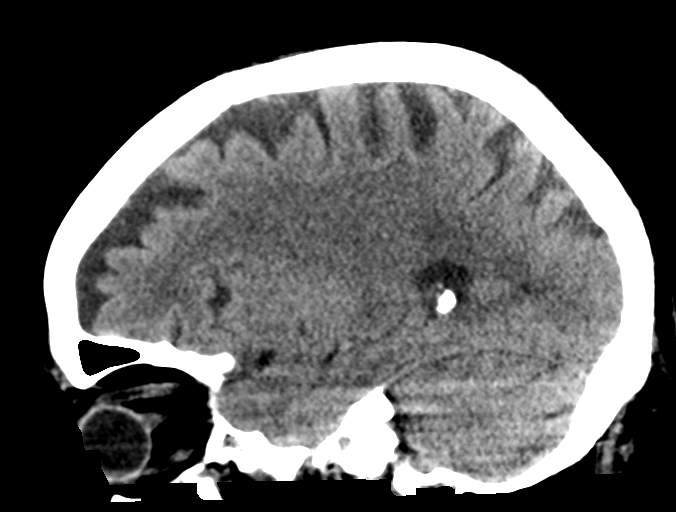

[15 of 47 positions shown; findings below may reference images not displayed]

FINDINGS: Brain: Remote appearing right thalamic lacunar infarct. Age related
involutional changes of the brain with mild-to-moderate small vessel
ischemic disease. No large vascular territory infarction, hemorrhage
or midline shift. No intra-axial mass nor extra-axial fluid
collections.

Vascular: No hyperdense vessel sign.

Skull: Intact

Sinuses/Orbits: Complete opacification of the included maxillary
sinuses with desiccated mucus seen within. Moderate ethmoid sinus
mucosal thickening more so anteriorly. The sphenoid sinus is clear.
There is right-sided frontal sinus mucosal opacification. Intact
orbits and globes.

Other: None
IMPRESSION: Paranasal sinus mucosal thickening in opacification as above.

Remote appearing small vessel ischemic disease and right thalamic
lacunar infarct. No acute intracranial abnormality.
